# Patient Record
Sex: Male | Born: 1950 | Race: White | Hispanic: No | Marital: Married | Smoking: Former smoker
Health system: Southern US, Community
[De-identification: ages and names within clinical notes are randomized; demographics above are authoritative.]

## PROBLEM LIST (undated history)

## (undated) DIAGNOSIS — R079 Chest pain, unspecified: Secondary | ICD-10-CM

## (undated) DIAGNOSIS — N201 Calculus of ureter: Secondary | ICD-10-CM

## (undated) DIAGNOSIS — M5416 Radiculopathy, lumbar region: Secondary | ICD-10-CM

## (undated) DIAGNOSIS — G4733 Obstructive sleep apnea (adult) (pediatric): Secondary | ICD-10-CM

## (undated) DIAGNOSIS — R9431 Abnormal electrocardiogram [ECG] [EKG]: Secondary | ICD-10-CM

## (undated) DIAGNOSIS — Z9989 Dependence on other enabling machines and devices: Secondary | ICD-10-CM

## (undated) DIAGNOSIS — Z87442 Personal history of urinary calculi: Secondary | ICD-10-CM

## (undated) DIAGNOSIS — E119 Type 2 diabetes mellitus without complications: Secondary | ICD-10-CM

## (undated) DIAGNOSIS — C61 Malignant neoplasm of prostate: Secondary | ICD-10-CM

## (undated) DIAGNOSIS — I10 Essential (primary) hypertension: Secondary | ICD-10-CM

## (undated) DIAGNOSIS — K219 Gastro-esophageal reflux disease without esophagitis: Secondary | ICD-10-CM

## (undated) DIAGNOSIS — T7840XA Allergy, unspecified, initial encounter: Secondary | ICD-10-CM

## (undated) DIAGNOSIS — Z8489 Family history of other specified conditions: Secondary | ICD-10-CM

## (undated) DIAGNOSIS — M1990 Unspecified osteoarthritis, unspecified site: Secondary | ICD-10-CM

## (undated) DIAGNOSIS — C4490 Unspecified malignant neoplasm of skin, unspecified: Secondary | ICD-10-CM

## (undated) DIAGNOSIS — I2699 Other pulmonary embolism without acute cor pulmonale: Secondary | ICD-10-CM

## (undated) DIAGNOSIS — R011 Cardiac murmur, unspecified: Secondary | ICD-10-CM

## (undated) DIAGNOSIS — H269 Unspecified cataract: Secondary | ICD-10-CM

## (undated) DIAGNOSIS — R319 Hematuria, unspecified: Secondary | ICD-10-CM

## (undated) DIAGNOSIS — I7789 Other specified disorders of arteries and arterioles: Secondary | ICD-10-CM

## (undated) DIAGNOSIS — G4730 Sleep apnea, unspecified: Secondary | ICD-10-CM

## (undated) DIAGNOSIS — Z4889 Encounter for other specified surgical aftercare: Secondary | ICD-10-CM

## (undated) DIAGNOSIS — Z973 Presence of spectacles and contact lenses: Secondary | ICD-10-CM

## (undated) DIAGNOSIS — Z01810 Encounter for preprocedural cardiovascular examination: Secondary | ICD-10-CM

## (undated) DIAGNOSIS — E785 Hyperlipidemia, unspecified: Secondary | ICD-10-CM

## (undated) DIAGNOSIS — IMO0002 Reserved for concepts with insufficient information to code with codable children: Secondary | ICD-10-CM

## (undated) DIAGNOSIS — R109 Unspecified abdominal pain: Secondary | ICD-10-CM

## (undated) DIAGNOSIS — T8859XA Other complications of anesthesia, initial encounter: Secondary | ICD-10-CM

## (undated) DIAGNOSIS — N200 Calculus of kidney: Secondary | ICD-10-CM

## (undated) DIAGNOSIS — K649 Unspecified hemorrhoids: Secondary | ICD-10-CM

## (undated) HISTORY — DX: Gastro-esophageal reflux disease without esophagitis: K21.9

## (undated) HISTORY — DX: Unspecified cataract: H26.9

## (undated) HISTORY — DX: Abnormal electrocardiogram (ECG) (EKG): R94.31

## (undated) HISTORY — DX: Obstructive sleep apnea (adult) (pediatric): G47.33

## (undated) HISTORY — PX: OTHER SURGICAL HISTORY: SHX169

## (undated) HISTORY — PX: SKIN CANCER EXCISION: SHX779

## (undated) HISTORY — DX: Essential (primary) hypertension: I10

## (undated) HISTORY — DX: Calculus of ureter: N20.1

## (undated) HISTORY — PX: UPPER GASTROINTESTINAL ENDOSCOPY: SHX188

## (undated) HISTORY — DX: Reserved for concepts with insufficient information to code with codable children: IMO0002

## (undated) HISTORY — PX: COLONOSCOPY: SHX174

## (undated) HISTORY — DX: Calculus of kidney: N20.0

## (undated) HISTORY — DX: Encounter for other specified surgical aftercare: Z48.89

## (undated) HISTORY — DX: Other specified disorders of arteries and arterioles: I77.89

## (undated) HISTORY — DX: Sleep apnea, unspecified: G47.30

## (undated) HISTORY — DX: Encounter for preprocedural cardiovascular examination: Z01.810

## (undated) HISTORY — DX: Radiculopathy, lumbar region: M54.16

## (undated) HISTORY — DX: Chest pain, unspecified: R07.9

## (undated) HISTORY — DX: Allergy, unspecified, initial encounter: T78.40XA

## (undated) HISTORY — DX: Unspecified malignant neoplasm of skin, unspecified: C44.90

## (undated) HISTORY — DX: Type 2 diabetes mellitus without complications: E11.9

## (undated) HISTORY — DX: Malignant neoplasm of prostate: C61

---
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
## 1987-06-03 HISTORY — PX: OTHER SURGICAL HISTORY: SHX169

## 2003-06-03 HISTORY — PX: CATARACT EXTRACTION, BILATERAL: SHX1313

## 2004-01-11 ENCOUNTER — Inpatient Hospital Stay (HOSPITAL_BASED_OUTPATIENT_CLINIC_OR_DEPARTMENT_OTHER): Admission: RE | Admit: 2004-01-11 | Discharge: 2004-01-11 | Payer: Self-pay | Admitting: Cardiology

## 2004-01-11 HISTORY — PX: CARDIAC CATHETERIZATION: SHX172

## 2004-01-15 ENCOUNTER — Ambulatory Visit (HOSPITAL_COMMUNITY): Admission: RE | Admit: 2004-01-15 | Discharge: 2004-01-15 | Payer: Self-pay | Admitting: Cardiology

## 2006-02-21 ENCOUNTER — Emergency Department (HOSPITAL_COMMUNITY): Admission: EM | Admit: 2006-02-21 | Discharge: 2006-02-21 | Payer: Self-pay | Admitting: Emergency Medicine

## 2009-02-07 ENCOUNTER — Emergency Department (HOSPITAL_COMMUNITY): Admission: EM | Admit: 2009-02-07 | Discharge: 2009-02-07 | Payer: Self-pay | Admitting: Emergency Medicine

## 2009-02-08 ENCOUNTER — Ambulatory Visit (HOSPITAL_COMMUNITY): Admission: RE | Admit: 2009-02-08 | Discharge: 2009-02-08 | Payer: Self-pay | Admitting: Urology

## 2009-02-08 HISTORY — PX: EXTRACORPOREAL SHOCK WAVE LITHOTRIPSY: SHX1557

## 2009-02-23 ENCOUNTER — Ambulatory Visit (HOSPITAL_BASED_OUTPATIENT_CLINIC_OR_DEPARTMENT_OTHER): Admission: RE | Admit: 2009-02-23 | Discharge: 2009-02-23 | Payer: Self-pay | Admitting: Urology

## 2009-02-23 HISTORY — PX: OTHER SURGICAL HISTORY: SHX169

## 2010-09-06 LAB — URINE MICROSCOPIC-ADD ON

## 2010-09-06 LAB — URINALYSIS, ROUTINE W REFLEX MICROSCOPIC
Bilirubin Urine: NEGATIVE
Glucose, UA: NEGATIVE mg/dL
Ketones, ur: NEGATIVE mg/dL
Leukocytes, UA: NEGATIVE
Nitrite: NEGATIVE
Protein, ur: NEGATIVE mg/dL
Specific Gravity, Urine: 1.018 (ref 1.005–1.030)
Urobilinogen, UA: 0.2 mg/dL (ref 0.0–1.0)
pH: 5.5 (ref 5.0–8.0)

## 2010-09-06 LAB — BASIC METABOLIC PANEL
BUN: 17 mg/dL (ref 6–23)
CO2: 24 mEq/L (ref 19–32)
Calcium: 9.1 mg/dL (ref 8.4–10.5)
Chloride: 105 mEq/L (ref 96–112)
Creatinine, Ser: 1.17 mg/dL (ref 0.4–1.5)
GFR calc Af Amer: >60 mL/min (ref >60)
GFR calc non Af Amer: >60 mL/min (ref >60)
Glucose, Bld: 125 mg/dL — ABNORMAL HIGH (ref 70–99)
Potassium: 4 mEq/L (ref 3.5–5.1)
Sodium: 139 mEq/L (ref 135–145)

## 2010-09-06 LAB — DIFFERENTIAL
Basophils Absolute: 0 10*3/uL (ref 0.0–0.1)
Basophils Relative: 0 % (ref 0–1)
Eosinophils Absolute: 0.1 10*3/uL (ref 0.0–0.7)
Eosinophils Relative: 1 % (ref 0–5)
Lymphocytes Relative: 19 % (ref 12–46)
Lymphs Abs: 1.9 10*3/uL (ref 0.7–4.0)
Monocytes Absolute: 0.7 10*3/uL (ref 0.1–1.0)
Monocytes Relative: 8 % (ref 3–12)
Neutro Abs: 7 10*3/uL (ref 1.7–7.7)
Neutrophils Relative %: 72 % (ref 43–77)

## 2010-09-06 LAB — CBC
HCT: 44 % (ref 39.0–52.0)
Hemoglobin: 15.2 g/dL (ref 13.0–17.0)
MCHC: 34.6 g/dL (ref 30.0–36.0)
MCV: 89.5 fL (ref 78.0–100.0)
Platelets: 174 10*3/uL (ref 150–400)
RBC: 4.92 MIL/uL (ref 4.22–5.81)
RDW: 13.2 % (ref 11.5–15.5)
WBC: 9.9 10*3/uL (ref 4.0–10.5)

## 2010-09-06 LAB — POCT I-STAT 4, (NA,K, GLUC, HGB,HCT)
Glucose, Bld: 116 mg/dL — ABNORMAL HIGH (ref 70–99)
HCT: 46 % (ref 39.0–52.0)
Hemoglobin: 15.6 g/dL (ref 13.0–17.0)
Potassium: 3.8 mEq/L (ref 3.5–5.1)
Sodium: 141 mEq/L (ref 135–145)

## 2010-10-18 NOTE — Cardiovascular Report (Signed)
NAME:  Joseph Pham, Joseph Pham                          ACCOUNT NO.:  [id number]   MEDICAL RECORD NO.:  [id number]                   PATIENT TYPE:  OIB   LOCATION:  6598                                 FACILITY:  [facility]   PHYSICIAN:  Learta Codding, M.D. [facility]             DATE OF BIRTH:  1950/11/25   DATE OF PROCEDURE:  01/11/2004  DATE OF DISCHARGE:                              CARDIAC CATHETERIZATION   PROCEDURE PERFORMED:  1. Left heart catheterization with selective coronary angiography.  2. Ventriculography.  3. Aortography.   DIAGNOSIS:  1. No evidence of significant epicardial flow limiting coronary artery     disease.  2. Normal left ventricular systolic function.  3. 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   INDICATIONS FOR PROCEDURE:  The patient is a 60 year old male with a history  of atypical substernal chest pain.  The patient was referred for a  Cardiolite study which suggested inferior ischemia.  Subsequently, he was  referred for a cardiac catheterization to assess his coronary anatomy.  The  patient does have multiple risk factors for CAD.   DESCRIPTION OF PROCEDURE:  After informed consent was obtained, the patient  was brought to the catheterization laboratory.  The right groin was  sterilely prepped and draped.  A 4 French arterial sheath was placed using  modified Seldinger technique.  A 4 [place] JL5 and 4 [place] progressive right  catheter were used for coronary angiography.  A 4 French angled pigtail  catheter was used for ventriculography.  Selective coronary angiography was  performed in various projections using minimal injection of contrast.  Ventriculography was performed using power injection of contrast.  Aortography was also performed in the LAO projection with power injection.  At the termination of the procedure, all catheters and sheaths were removed  with no complications encountered.   HEMODYNAMICS:  Left ventricular pressure 147/18 mmHg,  aortic pressure 147/87  mmHg.   VENTRICULOGRAPHY:  Ejection fraction 60% without segmental wall motion  abnormalities.   SELECTIVE CORONARY ANGIOGRAPHY:  1. Left main coronary artery was short without evidence of flow limiting     disease.  2. Left anterior descending  artery was a large caliber vessel without any     flow limiting lesions.  The diagonal branches were free of disease.  3. Left circumflex coronary artery essentially a normal vessel.  4. Right coronary artery demonstrates no significant flow limiting lesions.     The PDA and posterolateral branches were free of flow limiting lesions.   RECOMMENDATIONS:  No evidence of significant flow limiting coronary artery  disease.  The patient was found to have a dilated aortic root and ascending  aorta.  This was difficult to size using aortography.  The patient has been  scheduled for a CT angiogram to evaluate the aortic root and ascending aorta  and obtain measurements for future  reference.  There was no associated aortic insufficiency, however.  The  patient also had mild systemic hypertension and this should be aggressively  treated in the setting of this aortic dilatation.  The results were  discussed with Dr. Daleen Squibb who will see the patient in a couple of weeks in the  office after CT angiogram has been performed.                                               Learta Codding, M.D. [facility]    GED/MEDQ  D:  01/11/2004  T:  01/12/2004  Job:  [id number]   cc:   Thomas C. Wall, M.D.

## 2010-12-30 ENCOUNTER — Ambulatory Visit
Admission: RE | Admit: 2010-12-30 | Discharge: 2010-12-30 | Disposition: A | Discharge status: Home / Self Care | Payer: BC Managed Care – PPO | Source: Ambulatory Visit | Attending: Radiation Oncology | Admitting: Radiation Oncology

## 2010-12-30 DIAGNOSIS — I10 Essential (primary) hypertension: Secondary | ICD-10-CM | POA: Insufficient documentation

## 2010-12-30 DIAGNOSIS — Z79899 Other long term (current) drug therapy: Secondary | ICD-10-CM | POA: Insufficient documentation

## 2010-12-30 DIAGNOSIS — C61 Malignant neoplasm of prostate: Secondary | ICD-10-CM | POA: Insufficient documentation

## 2010-12-30 DIAGNOSIS — E7800 Pure hypercholesterolemia, unspecified: Secondary | ICD-10-CM | POA: Insufficient documentation

## 2010-12-30 DIAGNOSIS — G4730 Sleep apnea, unspecified: Secondary | ICD-10-CM | POA: Insufficient documentation

## 2010-12-30 DIAGNOSIS — K219 Gastro-esophageal reflux disease without esophagitis: Secondary | ICD-10-CM | POA: Insufficient documentation

## 2011-05-20 ENCOUNTER — Encounter: Payer: Self-pay | Admitting: *Deleted

## 2011-05-20 NOTE — Progress Notes (Unsigned)
IIEF results from 12/30/10= [id number]

## 2011-06-04 ENCOUNTER — Other Ambulatory Visit: Payer: Self-pay | Admitting: Urology

## 2011-06-05 ENCOUNTER — Other Ambulatory Visit: Payer: Self-pay

## 2011-06-05 ENCOUNTER — Other Ambulatory Visit: Payer: Self-pay | Admitting: Urology

## 2011-06-05 ENCOUNTER — Ambulatory Visit
Admission: RE | Admit: 2011-06-05 | Discharge: 2011-06-05 | Disposition: A | Discharge status: Home / Self Care | Payer: BC Managed Care – PPO | Source: Ambulatory Visit | Attending: Urology | Admitting: Urology

## 2011-06-05 ENCOUNTER — Ambulatory Visit (HOSPITAL_BASED_OUTPATIENT_CLINIC_OR_DEPARTMENT_OTHER)
Admission: RE | Admit: 2011-06-05 | Discharge: 2011-06-05 | Disposition: A | Discharge status: Home / Self Care | Payer: BC Managed Care – PPO | Source: Ambulatory Visit | Attending: Urology | Admitting: Urology

## 2011-06-05 DIAGNOSIS — C61 Malignant neoplasm of prostate: Secondary | ICD-10-CM

## 2011-06-05 DIAGNOSIS — Z01810 Encounter for preprocedural cardiovascular examination: Secondary | ICD-10-CM | POA: Insufficient documentation

## 2011-06-06 ENCOUNTER — Ambulatory Visit (HOSPITAL_COMMUNITY): Payer: BC Managed Care – PPO

## 2011-06-08 ENCOUNTER — Encounter: Payer: Self-pay | Admitting: Radiation Oncology

## 2011-06-08 NOTE — Progress Notes (Signed)
  Radiation Oncology         [phone] ________________________________  Name: Joseph Pham MRN: [id number]  Date: 06/08/2011  DOB: Mar 14, 1951  Pubic arch study  Prostate Volume 55.8 cc    PLAN:  The patient will receive 110 Gy boost with seeds.  ________________________________  Artist Pais Kathrynn Running, M.D.

## 2011-06-13 ENCOUNTER — Encounter: Payer: Self-pay | Admitting: Radiation Oncology

## 2011-06-13 NOTE — Progress Notes (Signed)
Ref [phone] - spoke to [facility] Tx, no precert req'd for prostate implant.

## 2011-07-03 ENCOUNTER — Encounter (HOSPITAL_BASED_OUTPATIENT_CLINIC_OR_DEPARTMENT_OTHER): Payer: Self-pay | Admitting: *Deleted

## 2011-07-03 ENCOUNTER — Telehealth: Payer: Self-pay | Admitting: *Deleted

## 2011-07-04 ENCOUNTER — Encounter (HOSPITAL_BASED_OUTPATIENT_CLINIC_OR_DEPARTMENT_OTHER): Payer: Self-pay | Admitting: *Deleted

## 2011-07-04 NOTE — Progress Notes (Signed)
NPO AFTER MN. ARRIVES AT 0845. CURRENT CXR AND EKG W/ CHART AND IN EPIC. LABS TO DONE TODAY 07-04-11. WILL TAKES TOPROL AND PREVACID AM OF SURG W/ SIP OF WATER . AND DO FLEET ENEMA AM DOS.

## 2011-07-07 LAB — APTT: aPTT: 33 seconds (ref 24–37)

## 2011-07-07 LAB — COMPREHENSIVE METABOLIC PANEL
ALT: 31 U/L (ref 0–53)
AST: 23 U/L (ref 0–37)
Albumin: 3.6 g/dL (ref 3.5–5.2)
Alkaline Phosphatase: 59 U/L (ref 39–117)
BUN: 12 mg/dL (ref 6–23)
CO2: 26 mEq/L (ref 19–32)
Calcium: 9.4 mg/dL (ref 8.4–10.5)
Chloride: 104 mEq/L (ref 96–112)
Creatinine, Ser: 1.07 mg/dL (ref 0.50–1.35)
GFR calc Af Amer: 85 mL/min — ABNORMAL LOW (ref >90)
GFR calc non Af Amer: 74 mL/min — ABNORMAL LOW (ref >90)
Glucose, Bld: 105 mg/dL — ABNORMAL HIGH (ref 70–99)
Potassium: 3.9 mEq/L (ref 3.5–5.1)
Sodium: 140 mEq/L (ref 135–145)
Total Bilirubin: 0.4 mg/dL (ref 0.3–1.2)
Total Protein: 7 g/dL (ref 6.0–8.3)

## 2011-07-07 LAB — CBC
HCT: 42.2 % (ref 39.0–52.0)
Hemoglobin: 14.6 g/dL (ref 13.0–17.0)
MCH: 29.9 pg (ref 26.0–34.0)
MCHC: 34.6 g/dL (ref 30.0–36.0)
MCV: 86.5 fL (ref 78.0–100.0)
Platelets: 166 10*3/uL (ref 150–400)
RBC: 4.88 MIL/uL (ref 4.22–5.81)
RDW: 13.6 % (ref 11.5–15.5)
WBC: 4.6 10*3/uL (ref 4.0–10.5)

## 2011-07-07 LAB — PROTIME-INR
INR: 0.93 (ref 0.00–1.49)
Prothrombin Time: 12.7 seconds (ref 11.6–15.2)

## 2011-07-10 DIAGNOSIS — C61 Malignant neoplasm of prostate: Secondary | ICD-10-CM

## 2011-07-10 HISTORY — DX: Malignant neoplasm of prostate: C61

## 2011-07-10 NOTE — H&P (Signed)
History of Present Illness   Adenocarcinoma of the prostate: His PSA in 10/11 was 5.9. A repeat PSA in 5/12 was noted to be 5.53 with a free to total ratio of 5%. He was also noted on DRE to have induration in the right apex of the prostate. Prostate vol. - 26.6cc. Path. - adenocarcinoma Gleason score 4+3 = 7 in 1 core, 3+4 = 7 in 4 cores and 3+3 = 6 in 3 cores for a total of 8/12 cores positive. Clinical stage T2a.         Nephrolithiasis: He underwent left ureteroscopy and laser lithotripsy of a left UPJ stone as well as removal of stones in the lower pole of his left kidney in 9/10. The stone was calcium oxalate. He had lithotripsy of the stone previously without fragmentation. He has 5 stones in the right kidney.   Past Medical History Problems  1. History of  Hydronephrosis On The Left 591 2. History of  Ureteral Stone Left 592.1  Surgical History Problems  1. History of  Biopsy Of The Prostate Needle 2. History of  Cystoscopy With Insertion Of Ureteral Stent Left 3. History of  Cystoscopy With Pyeloscopy With Lithotripsy Left 4. History of  Hernia Repair 5. History of  Knee Surgery 6. History of  Lithotripsy Left  Current Meds 1. Crestor TABS; Therapy: (Recorded:08Sep2010) to 2. Fexofenadine HCl 180 MG Oral Tablet; Therapy: (Recorded:08Sep2010) to 3. Levofloxacin 500 MG Oral Tablet; 1 po q day beginning the day prior to biopsy; Therapy:  18May2012 to (Evaluate:21May2012)  Requested for: 18May2012; Last Rx:18May2012 4. Lovaza CAPS; Therapy: (Recorded:08Sep2010) to 5. Multivitamin/Iron TABS; Therapy: (Recorded:08Sep2010) to 6. Potassium Citrate 10 MEQ (1080 MG) Oral Tablet Extended Release; TAKE 2 TABLETS AT  BEDTIME; Therapy: 24Oct2011 to (Evaluate:19Oct2012); Last Rx:25Oct2011 7. Prevacid CPDR; Therapy: (Recorded:18May2012) to 8. Toprol XL 25 MG Oral Tablet Extended Release 24 Hour; Therapy: (Recorded:08Sep2010) to  Allergies Medication  1. Aspirin TABS 2. Trimox  CAPS  Social History Problems  1. Caffeine Use 3 cans daily 2. Marital History - Currently Married 3. Occupation: truck Hospital doctor 4. History of  Tobacco Use V15.82 smoked two packs dailysmoked for 20 yearsquit smoking 15 years ago.  Review of Systems: Pertinent items are noted in HPI. A comprehensive review of systems was negative except as above  Physical Exam: General appearance: alert and appears stated age Head: Normocephalic, without obvious abnormality, atraumatic Eyes: conjunctivae/corneas clear. EOM's intact.  Oropharynx: moist mucous membranes Neck: supple, symmetrical, trachea midline Resp: normal respiratory effort Cardio: regular rate and rhythm Back: symmetric, no curvature. ROM normal. No CVA tenderness. GI: soft, non-tender; bowel sounds normal; no masses,  no organomegaly Male genitalia: penis: normal male phallus with no lesions or discharge. Testes: bilaterally descended with no masses or tenderness. no hernias Rectal: He has normal sphincter tone. There is induration in the apex of the right lobe of his prostate. Extremities: extremities normal, atraumatic, no cyanosis or edema Skin: Skin color normal. No visible rashes or lesions Neurologic: Grossly normal  Assessed  1. Adenocarcinoma Of The Prostate Gland 185 1    Plan The patient was counseled about the natural history of prostate cancer and the standard treatment options that are available for prostate cancer. It was explained to him how his age and life expectancy, clinical stage, Gleason score, and PSA affect his prognosis, the decision to proceed with additional staging studies, as well as how that information influences recommended treatment strategies. We discussed the roles for active surveillance, radiation therapy, surgical  therapy, androgen deprivation, as well as ablative therapy options for the treatment of prostate cancer as appropriate to his individual cancer situation. We discussed the risks  and benefits of these options with regard to their impact on cancer control and also in terms of potential adverse events, complications, and impact on quiality of life particularly related to urinary, bowel, and sexual function. The patient was encouraged to ask questions throughout the discussion today and all questions were answered to his stated satisfaction. In addition, the patient was provided with and/or directed to appropriate resources and literature for further education about prostate cancer and treatment options.   45 minutes were spent in face to face consultation with patient today.        We discussed his treatment options and he was primarily interested in brachytherapy. He had many good questions about this form of therapy which I have answered to his satisfaction. He saw Dr. Kathrynn Running and has elected to proceed with radioactive seed implantation.

## 2011-07-10 NOTE — Progress Notes (Signed)
Received call from Jolyn Lent, [facility], regarding pt's current uri. Pt seen by physician, currently on antibx.  Dr. Rica Mast informed. Pt called earlier to say that he has uri w productive cough. Dr. Rica Mast informed.  Case cancelled per dr. Rica Mast.  Jolyn Lent, or scheduler for Dr. Ronal Fear informed.

## 2011-07-11 ENCOUNTER — Encounter: Payer: Self-pay | Admitting: Radiation Oncology

## 2011-07-11 NOTE — Progress Notes (Unsigned)
Spoke to [employer], no prior authorization required for prostate implant - CPT codes 02725, [id number], [id number], [id number], [id number], [id number], [id number].  Ref #[id number]

## 2011-07-25 NOTE — Telephone Encounter (Signed)
xxx

## 2011-08-04 ENCOUNTER — Encounter (HOSPITAL_BASED_OUTPATIENT_CLINIC_OR_DEPARTMENT_OTHER): Payer: Self-pay | Admitting: *Deleted

## 2011-08-04 LAB — PROTIME-INR
INR: 0.96 (ref 0.00–1.49)
Prothrombin Time: 13 seconds (ref 11.6–15.2)

## 2011-08-04 LAB — CBC
HCT: 43 % (ref 39.0–52.0)
Hemoglobin: 15 g/dL (ref 13.0–17.0)
MCH: 30.5 pg (ref 26.0–34.0)
MCHC: 34.9 g/dL (ref 30.0–36.0)
MCV: 87.4 fL (ref 78.0–100.0)
Platelets: 175 10*3/uL (ref 150–400)
RBC: 4.92 MIL/uL (ref 4.22–5.81)
RDW: 13.5 % (ref 11.5–15.5)
WBC: 5.3 10*3/uL (ref 4.0–10.5)

## 2011-08-04 LAB — COMPREHENSIVE METABOLIC PANEL
ALT: 24 U/L (ref 0–53)
AST: 22 U/L (ref 0–37)
Albumin: 3.9 g/dL (ref 3.5–5.2)
Alkaline Phosphatase: 60 U/L (ref 39–117)
BUN: 14 mg/dL (ref 6–23)
CO2: 29 mEq/L (ref 19–32)
Calcium: 9.6 mg/dL (ref 8.4–10.5)
Chloride: 101 mEq/L (ref 96–112)
Creatinine, Ser: 1.06 mg/dL (ref 0.50–1.35)
GFR calc Af Amer: 86 mL/min — ABNORMAL LOW (ref >90)
GFR calc non Af Amer: 74 mL/min — ABNORMAL LOW (ref >90)
Glucose, Bld: 95 mg/dL (ref 70–99)
Potassium: 4.2 mEq/L (ref 3.5–5.1)
Sodium: 140 mEq/L (ref 135–145)
Total Bilirubin: 0.6 mg/dL (ref 0.3–1.2)
Total Protein: 7.2 g/dL (ref 6.0–8.3)

## 2011-08-04 LAB — APTT: aPTT: 33 seconds (ref 24–37)

## 2011-08-04 NOTE — Progress Notes (Signed)
NPO AFTER MN. ARRIVES AT 0845. LABS TO DONE AT [facility]. CURRENT EKG AND CXR W/ CHART. WILL TAKE TOPROL , PREVACID AND ALLEGRA AM OF SURG. W/ SIPS OF WATER. AND DO FLEET ENEMA .  (FYI: PT WAS SCHEDULED ON 07-04-2011, CX'D DUE TO PT HAD THE FLU).

## 2011-08-06 NOTE — H&P (Signed)
History of Present Illness   Adenocarcinoma of the prostate: His PSA in 10/11 was 5.9. A repeat PSA in 5/12 was noted to be 5.53 with a free to total ratio of 5%. He was also noted on DRE to have induration in the right apex of the prostate. Prostate vol. - 26.6cc. Path. - adenocarcinoma Gleason score 4+3 = 7 in 1 core, 3+4 = 7 in 4 cores and 3+3 = 6 in 3 cores for a total of 8/12 cores positive. Clinical stage T2a.         Nephrolithiasis: He underwent left ureteroscopy and laser lithotripsy of a left UPJ stone as well as removal of stones in the lower pole of his left kidney in 9/10. The stone was calcium oxalate. He had lithotripsy of the stone previously without fragmentation. He has 5 stones in the right kidney.   Past Medical History Problems  1. History of  Hydronephrosis On The Left 591 2. History of  Ureteral Stone Left 592.1  Surgical History Problems  1. History of  Biopsy Of The Prostate Needle 2. History of  Cystoscopy With Insertion Of Ureteral Stent Left 3. History of  Cystoscopy With Pyeloscopy With Lithotripsy Left 4. History of  Hernia Repair 5. History of  Knee Surgery 6. History of  Lithotripsy Left  Current Meds 1. Crestor TABS; Therapy: (Recorded:08Sep2010) to 2. Fexofenadine HCl 180 MG Oral Tablet; Therapy: (Recorded:08Sep2010) to 3. Levofloxacin 500 MG Oral Tablet; 1 po q day beginning the day prior to biopsy; Therapy:  18May2012 to (Evaluate:21May2012)  Requested for: 18May2012; Last Rx:18May2012 4. Lovaza CAPS; Therapy: (Recorded:08Sep2010) to 5. Multivitamin/Iron TABS; Therapy: (Recorded:08Sep2010) to 6. Potassium Citrate 10 MEQ (1080 MG) Oral Tablet Extended Release; TAKE 2 TABLETS AT  BEDTIME; Therapy: 24Oct2011 to (Evaluate:19Oct2012); Last Rx:25Oct2011 7. Prevacid CPDR; Therapy: (Recorded:18May2012) to 8. Toprol XL 25 MG Oral Tablet Extended Release 24 Hour; Therapy: (Recorded:08Sep2010) to  Allergies Medication  1. Aspirin TABS 2. Trimox  CAPS  Social History Problems  1. Caffeine Use 3 cans daily 2. Marital History - Currently Married 3. Occupation: truck Hospital doctor 4. History of  Tobacco Use V15.82 smoked two packs dailysmoked for 20 yearsquit smoking 15 years ago Denied  5. History of  Alcohol Use  Review of Systems Genitourinary, constitutional, skin, eye, otolaryngeal, hematologic/lymphatic, cardiovascular, pulmonary, endocrine, musculoskeletal, gastrointestinal, neurological and psychiatric system(s) were reviewed and pertinent findings if present are noted.    Vitals Vital Signs [Data Includes: Last 1 Day]  08Sep2010 11:21AM  Blood Pressure  Blood Pressure: 130 / 79 Temperature  Temperature: 98.4 F Pulse  Heart Rate: 74  Physical Exam Constitutional: Well nourished and well developed. No acute distress.  ENT:. The ears and nose are normal in appearance.  Neck: The appearance of the neck is normal and no neck mass is present.  Pulmonary: No respiratory distress and normal respiratory rhythm and effort.  Cardiovascular: Heart rate and rhythm are normal. No peripheral edema.  Abdomen: The abdomen is soft and nontender. No masses are palpated. No CVA tenderness. No hernias are palpable. No hepatosplenomegaly noted.  . Genitourinary: On physical examination he was in no distress. He is moderately obese. He had no CVA tenderness. He may have been minimally tender in the left lower quadrant. Both testicles were normal. He had some rectal stenosis and his prostate was a little bit difficult to feel. He says his PSA checkups have been normal.   Lymphatics: The femoral and inguinal nodes are not enlarged or tender.  Skin: Normal skin  turgor, no visible rash and no visible skin lesions.  Neuro/Psych:. Mood and affect are appropriate.   Assessment Assessed  1. Adenocarcinoma Of The Prostate Gland 185 1   Plan    The patient was counseled about the natural history of prostate cancer and the standard treatment  options that are available for prostate cancer. It was explained to him how his age and life expectancy, clinical stage, Gleason score, and PSA affect his prognosis, the decision to proceed with additional staging studies, as well as how that information influences recommended treatment strategies. We discussed the roles for active surveillance, radiation therapy, surgical therapy, androgen deprivation, as well as ablative therapy options for the treatment of prostate cancer as appropriate to his individual cancer situation. We discussed the risks and benefits of these options with regard to their impact on cancer control and also in terms of potential adverse events, complications, and impact on quiality of life particularly related to urinary, bowel, and sexual function. The patient was encouraged to ask questions throughout the discussion today and all questions were answered to his stated satisfaction. In addition, the patient was provided with and/or directed to appropriate resources and literature for further education about prostate cancer and treatment options.   45 minutes were spent in face to face consultation with patient today.        We discussed his treatment options and he was primarily interested in brachytherapy. He had many good questions about this form of therapy which I have answered to his satisfaction. After consultation with Dr. Kathrynn Running he made his decision to proceed with radioactive seed implantation and presents for that.

## 2011-08-07 ENCOUNTER — Telehealth: Payer: Self-pay | Admitting: *Deleted

## 2011-08-07 NOTE — Telephone Encounter (Signed)
xxxx 

## 2011-08-08 ENCOUNTER — Encounter (HOSPITAL_BASED_OUTPATIENT_CLINIC_OR_DEPARTMENT_OTHER): Payer: Self-pay | Admitting: Anesthesiology

## 2011-08-08 ENCOUNTER — Encounter (HOSPITAL_BASED_OUTPATIENT_CLINIC_OR_DEPARTMENT_OTHER): Payer: Self-pay | Admitting: *Deleted

## 2011-08-08 ENCOUNTER — Encounter (HOSPITAL_BASED_OUTPATIENT_CLINIC_OR_DEPARTMENT_OTHER): Payer: Self-pay | Admitting: Certified Registered Nurse Anesthetist

## 2011-08-08 ENCOUNTER — Ambulatory Visit (HOSPITAL_COMMUNITY): Payer: BC Managed Care – PPO | Attending: Urology

## 2011-08-08 ENCOUNTER — Ambulatory Visit (HOSPITAL_BASED_OUTPATIENT_CLINIC_OR_DEPARTMENT_OTHER)
Admission: RE | Admit: 2011-08-08 | Discharge: 2011-08-08 | Disposition: A | Discharge status: Home Health | Payer: BC Managed Care – PPO | Source: Ambulatory Visit | Attending: Urology | Admitting: Urology

## 2011-08-08 ENCOUNTER — Encounter (HOSPITAL_BASED_OUTPATIENT_CLINIC_OR_DEPARTMENT_OTHER)
Admission: RE | Disposition: A | Discharge status: Home Health | Payer: Self-pay | Source: Ambulatory Visit | Attending: Urology

## 2011-08-08 DIAGNOSIS — C61 Malignant neoplasm of prostate: Secondary | ICD-10-CM | POA: Insufficient documentation

## 2011-08-08 DIAGNOSIS — Z87442 Personal history of urinary calculi: Secondary | ICD-10-CM | POA: Insufficient documentation

## 2011-08-08 DIAGNOSIS — Z01812 Encounter for preprocedural laboratory examination: Secondary | ICD-10-CM | POA: Insufficient documentation

## 2011-08-08 DIAGNOSIS — Z79899 Other long term (current) drug therapy: Secondary | ICD-10-CM | POA: Insufficient documentation

## 2011-08-08 HISTORY — DX: Essential (primary) hypertension: I10

## 2011-08-08 HISTORY — DX: Gastro-esophageal reflux disease without esophagitis: K21.9

## 2011-08-08 HISTORY — DX: Hyperlipidemia, unspecified: E78.5

## 2011-08-08 HISTORY — DX: Obstructive sleep apnea (adult) (pediatric): G47.33

## 2011-08-08 HISTORY — DX: Personal history of urinary calculi: Z87.442

## 2011-08-08 HISTORY — PX: RADIOACTIVE SEED IMPLANT: SHX5150

## 2011-08-08 HISTORY — DX: Dependence on other enabling machines and devices: Z99.89

## 2011-08-08 HISTORY — DX: Unspecified osteoarthritis, unspecified site: M19.90

## 2011-08-08 SURGERY — INSERTION, RADIATION SOURCE, PROSTATE
Anesthesia: General | Site: Prostate | Wound class: Clean Contaminated

## 2011-08-08 MED ORDER — ONDANSETRON HCL 4 MG/2ML IJ SOLN
INTRAMUSCULAR | Status: DC | PRN
  Administered 2011-08-08: 4 mg via INTRAVENOUS

## 2011-08-08 MED ORDER — IOHEXOL 350 MG/ML SOLN
INTRAVENOUS | Status: DC | PRN
  Administered 2011-08-08: 7 mL via INTRAVENOUS

## 2011-08-08 MED ORDER — STERILE WATER FOR IRRIGATION IR SOLN
Status: DC | PRN
  Administered 2011-08-08: 3000 mL via INTRAVESICAL

## 2011-08-08 MED ORDER — LACTATED RINGERS IV SOLN: INTRAVENOUS | Status: DC

## 2011-08-08 MED ORDER — HYDROCODONE-ACETAMINOPHEN 7.5-325 MG PO TABS
1.0000 | ORAL_TABLET | ORAL | Status: AC | PRN
Start: 2011-08-08 — End: 2011-08-18

## 2011-08-08 MED ORDER — FENTANYL CITRATE 0.05 MG/ML IJ SOLN: 25.0000 ug | INTRAMUSCULAR | Status: DC | PRN

## 2011-08-08 MED ORDER — DEXAMETHASONE SODIUM PHOSPHATE 4 MG/ML IJ SOLN
INTRAMUSCULAR | Status: DC | PRN
  Administered 2011-08-08: 10 mg via INTRAVENOUS

## 2011-08-08 MED ORDER — MIDAZOLAM HCL 5 MG/5ML IJ SOLN
INTRAMUSCULAR | Status: DC | PRN
  Administered 2011-08-08: 2 mg via INTRAVENOUS

## 2011-08-08 MED ORDER — CIPROFLOXACIN HCL 500 MG PO TABS: 500.0000 mg | ORAL_TABLET | Freq: Two times a day (BID) | ORAL | Status: AC

## 2011-08-08 MED ORDER — PROPOFOL 10 MG/ML IV EMUL
INTRAVENOUS | Status: DC | PRN
  Administered 2011-08-08: 200 mg via INTRAVENOUS
  Administered 2011-08-08: 100 mg via INTRAVENOUS

## 2011-08-08 MED ORDER — FLEET ENEMA 7-19 GM/118ML RE ENEM: 1.0000 | ENEMA | Freq: Once | RECTAL | Status: DC

## 2011-08-08 MED ORDER — FENTANYL CITRATE 0.05 MG/ML IJ SOLN
INTRAMUSCULAR | Status: DC | PRN
Start: 2011-08-08 — End: 2011-08-08
  Administered 2011-08-08 (×2): 50 ug via INTRAVENOUS

## 2011-08-08 MED ORDER — CIPROFLOXACIN IN D5W 400 MG/200ML IV SOLN
400.0000 mg | INTRAVENOUS | Status: AC
  Administered 2011-08-08: 400 mg via INTRAVENOUS

## 2011-08-08 MED ORDER — LIDOCAINE HCL (CARDIAC) 20 MG/ML IV SOLN
INTRAVENOUS | Status: DC | PRN
  Administered 2011-08-08: 80 mg via INTRAVENOUS

## 2011-08-08 MED ORDER — LACTATED RINGERS IV SOLN
INTRAVENOUS | Status: DC
  Administered 2011-08-08: 11:00:00 via INTRAVENOUS
  Administered 2011-08-08: 100 mL/h via INTRAVENOUS

## 2011-08-08 SURGICAL SUPPLY — 28 items
BAG URINE DRAINAGE (UROLOGICAL SUPPLIES) ×2 IMPLANT
BLADE SURG ROTATE 9660 (MISCELLANEOUS) ×2 IMPLANT
CATH FOLEY 2WAY SLVR  5CC 16FR (CATHETERS) ×2
CATH FOLEY 2WAY SLVR 5CC 16FR (CATHETERS) ×2 IMPLANT
CATH ROBINSON RED A/P 20FR (CATHETERS) ×2 IMPLANT
CLOTH BEACON ORANGE TIMEOUT ST (SAFETY) ×2 IMPLANT
COVER MAYO STAND STRL (DRAPES) ×2 IMPLANT
COVER TABLE BACK 60X90 (DRAPES) ×2 IMPLANT
DRSG TEGADERM 4X4.75 (GAUZE/BANDAGES/DRESSINGS) ×2 IMPLANT
DRSG TEGADERM 8X12 (GAUZE/BANDAGES/DRESSINGS) ×2 IMPLANT
GLOVE BIO SURGEON STRL SZ7.5 (GLOVE) IMPLANT
GLOVE BIO SURGEON STRL SZ8 (GLOVE) ×4 IMPLANT
GLOVE BIOGEL PI IND STRL 7.0 (GLOVE) ×3 IMPLANT
GLOVE BIOGEL PI INDICATOR 7.0 (GLOVE) ×3
GLOVE ECLIPSE 8.0 STRL XLNG CF (GLOVE) ×8 IMPLANT
GOWN STRL REIN XL XLG (GOWN DISPOSABLE) ×2 IMPLANT
GOWN SURGICAL LARGE (GOWNS) ×2 IMPLANT
GOWN XL W/COTTON TOWEL STD (GOWNS) ×2 IMPLANT
HOLDER FOLEY CATH W/STRAP (MISCELLANEOUS) ×2 IMPLANT
IV NS IRRIG 3000ML ARTHROMATIC (IV SOLUTION) IMPLANT
IV WATER IRR. 1000ML (IV SOLUTION) ×2 IMPLANT
PACK CYSTOSCOPY (CUSTOM PROCEDURE TRAY) ×2 IMPLANT
SPONGE GAUZE 4X4 12PLY (GAUZE/BANDAGES/DRESSINGS) ×2 IMPLANT
SYRINGE 10CC LL (SYRINGE) ×2 IMPLANT
UNDERPAD 30X30 INCONTINENT (UNDERPADS AND DIAPERS) ×4 IMPLANT
WATER STERILE IRR 3000ML UROMA (IV SOLUTION) ×2 IMPLANT
WATER STERILE IRR 500ML POUR (IV SOLUTION) ×2 IMPLANT
radioactive seeds ×2 IMPLANT

## 2011-08-08 NOTE — Interval H&P Note (Signed)
History and Physical Interval Note:  08/08/2011 9:28 AM  Joseph Pham  has presented today for surgery, with the diagnosis of prostate cancer  The various methods of treatment have been discussed with the patient and family. After consideration of risks, benefits and other options for treatment, the patient has consented to  Procedure(s) (LRB): RADIOACTIVE SEED IMPLANT (N/A) as a surgical intervention .  The patients' history has been reviewed, patient examined, no change in status, stable for surgery.  I have reviewed the patients' chart and labs.  Questions were answered to the patient's satisfaction.     [place]

## 2011-08-08 NOTE — Anesthesia Preprocedure Evaluation (Addendum)
Anesthesia Evaluation  Patient identified by MRN, date of birth, ID band Patient awake    Reviewed: Allergy & Precautions, H&P , NPO status , Patient's Chart, lab work & pertinent test results, reviewed documented beta blocker date and time   Airway Mallampati: II TM Distance: >3 FB Neck ROM: full    Dental  (+) Dental Advisory Given,    Pulmonary neg pulmonary ROS, sleep apnea and Continuous Positive Airway Pressure Ventilation ,  breath sounds clear to auscultation  Pulmonary exam normal       Cardiovascular Exercise Tolerance: Good hypertension, Pt. on home beta blockers negative cardio ROS  Rhythm:regular Rate:Normal     Neuro/Psych negative neurological ROS  negative psych ROS   GI/Hepatic negative GI ROS, Neg liver ROS, GERD-  Medicated and Controlled,  Endo/Other  negative endocrine ROSMorbid obesity  Renal/GU negative Renal ROS  negative genitourinary   Musculoskeletal   Abdominal (+) + obese,   Peds  Hematology negative hematology ROS (+)   Anesthesia Other Findings   Reproductive/Obstetrics negative OB ROS                          Anesthesia Physical Anesthesia Plan  ASA: III  Anesthesia Plan: General   Post-op Pain Management:    Induction: Intravenous  Airway Management Planned: LMA  Additional Equipment:   Intra-op Plan:   Post-operative Plan:   Informed Consent: I have reviewed the patients History and Physical, chart, labs and discussed the procedure including the risks, benefits and alternatives for the proposed anesthesia with the patient or authorized representative who has indicated his/her understanding and acceptance.   Dental Advisory Given  Plan Discussed with: CRNA and Surgeon  Anesthesia Plan Comments:         Anesthesia Quick Evaluation

## 2011-08-08 NOTE — Transfer of Care (Signed)
Immediate Anesthesia Transfer of Care Note  Patient: Joseph Pham  Procedure(s) Performed: Procedure(s) (LRB): RADIOACTIVE SEED IMPLANT (N/A)  Patient Location: PACU  Anesthesia Type: General  Level of Consciousness: awake, alert , oriented and patient cooperative  Airway & Oxygen Therapy: Patient Spontanous Breathing and Patient connected to face mask oxygen  Post-op Assessment: Report given to PACU RN and Post -op Vital signs reviewed and stable  Post vital signs: Reviewed and stable  Complications: No apparent anesthesia complications

## 2011-08-08 NOTE — Anesthesia Postprocedure Evaluation (Signed)
  Anesthesia Post-op Note  Patient: Joseph Pham  Procedure(s) Performed: Procedure(s) (LRB): RADIOACTIVE SEED IMPLANT (N/A)  Patient Location: PACU  Anesthesia Type: General  Level of Consciousness: awake and alert   Airway and Oxygen Therapy: Patient Spontanous Breathing  Post-op Pain: mild  Post-op Assessment: Post-op Vital signs reviewed, Patient's Cardiovascular Status Stable, Respiratory Function Stable, Patent Airway and No signs of Nausea or vomiting  Post-op Vital Signs: stable  Complications: No apparent anesthesia complications

## 2011-08-08 NOTE — Anesthesia Procedure Notes (Signed)
Procedure Name: LMA Insertion Performed by: Maris Berger Pre-anesthesia Checklist: Patient identified, Emergency Drugs available, Suction available and Patient being monitored Patient Re-evaluated:Patient Re-evaluated prior to inductionOxygen Delivery Method: Circle System Utilized Preoxygenation: Pre-oxygenation with 100% oxygen Intubation Type: IV induction Ventilation: Mask ventilation without difficulty LMA: LMA with gastric port inserted LMA Size: 5.0 Number of attempts: 1 Placement Confirmation: positive ETCO2 Tube secured with: Tape Dental Injury: Teeth and Oropharynx as per pre-operative assessment

## 2011-08-08 NOTE — Discharge Instructions (Addendum)
DISCHARGE INSTRUCTIONS FOR PROSTATE SEED IMPLANTATION  Removal of catheter Remove the foley catheter after 24 hours ( day after the procedure).can be done easily by cutting the side port of the catheter, whichallow the balloon to deflate.  You will see 1-2 teaspoons of clear water as the balloon deflates and then the catheter can be slid out without difficulty.        Cut here  Antibiotics You may be given a prescription for an antibiotic to take when you arrive home. If so, be sure to take every tablet in the bottle, even if you are feeling better before the prescription is finished. If you begin itching, notice a rash or start to swell on your trunk, arms, legs and/or throat, immediately stop taking the antibiotic and call your Urologist. Diet Resume your usual diet when you return home. To keep your bowels moving easily and softly, drink prune, apple and cranberry juice at room temperature. You may also take a stool softener, such as Colace, which is available without prescription at local pharmacies. Daily activities   No driving or heavy lifting for at least two days after the implant.   No bike riding, horseback riding or riding lawn mowers for the first month after the implant.   Any strenuous physical activity should be approved by your doctor before you resume it. Sexual relations You may resume sexual relations two weeks after the procedure. A condom should be used for the first two weeks. Your semen may be dark brown or black; this is normal and is related bleeding that may have occurred during the implant. Postoperative swelling Expect swelling and bruising of the scrotum and perineum (the area between the scrotum and anus). Both the swelling and the bruising should resolve in l or 2 weeks. Ice packs and over- the-counter medications such as Tylenol, Advil or Aleve may lessen your discomfort. Postoperative urination Most men experience burning on urination and/or urinary frequency.  If this becomes bothersome, contact your Urologist.  Medication can be prescribed to relieve these problems.  It is normal to have some blood in your urine for a few days after the implant. Special instructions related to the seeds It is unlikely that you will pass an Iodine-125 seed in your urine. The seeds are silver in color and are about as large as a grain of rice. If you pass a seed, do not handle it with your fingers. Use a spoon to place it in an envelope or jar in place this in base occluded area such as the garage or basement for return to the radiation clinic at your convenience.  Contact your doctor for   Temperature greater than 101 F   Increasing pain   Inability to urinate Follow-up  You should have follow up with your urologist and radiation oncologist about 3 weeks after the procedure. General information regarding Iodine seeds   Iodine-125 is a low energy radioactive material. It is not deeply penetrating and loses energy at short distances. Your prostate will absorb the radiation. Objects that are touched or used by the patient do not become radioactive.   Body wastes (urine and stool) or body fluids (saliva, tears, semen or blood) are not radioactive.   The Nuclear Regulatory Commission [facility]) has determined that no radiation precautions are needed for patients undergoing Iodine-125 seed implantation. The [facility] states that such patients do not present a risk to the people around them, including young children and pregnant women. However, in keeping with the general principle  that radiation exposure should be kept as low reasonably possible, we suggest the following:   Children and pets should not sit on the patient's lap for the first two (2) weeks after the implant.   Pregnant (or possibly pregnant) women should avoid prolonged, close contact with the patient for the first two (2) weeks after the implant.   A distance of three (3) feet is acceptable. At a distance of three (3)  feet, there is no limit to the length of time anyone can be with the patientBrachytherapy for Prostate Cancer Brachytherapy for prostate cancer is radiation treatment. It uses ultrasound to guide the insertion of tiny radioactive seeds into the prostate. This allows additional radiation to be given to the tumor and less radiation to the surrounding normal tissues. The seeds are about the thickness of a pencil lead and just over ? inch long. Brachytherapy is associated with fewer side effects than external beam radiation therapy alone. Usually, there is a delay of about 2 months after the seed implants before external radiation is begun, if external radiation is also used. LET YOUR CAREGIVER KNOW ABOUT:  Any allergies.  All medicines you are taking including vitamins, herbs, eyedrops, and over-the-counter medicines and creams.  Use of steroids (through mouth or as creams).  Previous problems with medicines that make a person sleep (general anesthetics) or numbing medicines.  History of bleeding or blood problems.  Previous surgery.  Any health problems.  RISKS AND COMPLICATIONS  Erectile dysfunction (impotence).  Bleeding.  Infection.  Reaction to anesthesia.  Involuntary leakage of urine (urinary incontinence).  BEFORE THE PROCEDURE  Ask your caregiver about changing or stopping your regular medicines. PROCEDURE  The procedure is performed under a general or spinal anesthetic.  An ultrasound is used to guide the long, thin metal tubes into the prostate gland. These tubes help place the seeds.  The metal tubes are then removed.  The seeds can be left in place permanently.  AFTER THE PROCEDURE  You will have a catheter in your bladder. The catheter will be removed later in your urologist's office.  Avoid being around children and pregnant women. Over time, the radiation will decrease and become inactive.  X-rays and CT scans may be taken to show the location of the seeds.  A specialist can  calculate the exact amount of radiation received.  What to expect: A little blood in the urine for a couple days is normal.  Your urine stream may be weaker for a couple weeks.  Document Released: 10/27/2005 Document Revised: 05/08/2011 Document Reviewed: 11/10/2005 [facility] Patient Information 2012 [place], [place]..Brachytherapy for Prostate Cancer Brachytherapy for prostate cancer is radiation treatment. It uses ultrasound to guide the insertion of tiny radioactive seeds into the prostate. This allows additional radiation to be given to the tumor and less radiation to the surrounding normal tissues. The seeds are about the thickness of a pencil lead and just over ? inch long. Brachytherapy is associated with fewer side effects than external beam radiation therapy alone. Usually, there is a delay of about 2 months after the seed implants before external radiation is begun, if external radiation is also used. LET YOUR CAREGIVER KNOW ABOUT:   Any allergies.   All medicines you are taking including vitamins, herbs, eyedrops, and over-the-counter medicines and creams.   Use of steroids (through mouth or as creams).   Previous problems with medicines that make a person sleep (general anesthetics) or numbing medicines.   History of bleeding or blood problems.  Previous surgery.   Any health problems.  RISKS AND COMPLICATIONS   Erectile dysfunction (impotence).   Bleeding.   Infection.   Reaction to anesthesia.   Involuntary leakage of urine (urinary incontinence).  BEFORE THE PROCEDURE  Ask your caregiver about changing or stopping your regular medicines. PROCEDURE   The procedure is performed under a general or spinal anesthetic.   An ultrasound is used to guide the long, thin metal tubes into the prostate gland. These tubes help place the seeds.   The metal tubes are then removed.   The seeds can be left in place permanently.  AFTER THE PROCEDURE   You will have a catheter  in your bladder. The catheter will be removed later in your urologist's office.   Avoid being around children and pregnant women. Over time, the radiation will decrease and become inactive.   X-rays and CT scans may be taken to show the location of the seeds.   A specialist can calculate the exact amount of radiation received.  What to expect:  A little blood in the urine for a couple days is normal.   Your urine stream may be weaker for a couple weeks.  Document Released: 10/27/2005 Document Revised: 05/08/2011 Document Reviewed: 11/10/2005   [facility] Patient Information 2012 [place], [place].

## 2011-08-08 NOTE — Op Note (Signed)
PATIENT:  Joseph Pham  PRE-OPERATIVE DIAGNOSIS:  Adenocarcinoma of the prostate  POST-OPERATIVE DIAGNOSIS:  Same  PROCEDURE:  Procedure(s): 1. I-125 radioactive seed implantation 2. Cystoscopy  SURGEON:  Surgeon(s): Garnett Farm  Radiation oncologist: Dr. 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 He was found to have induration in the apical region of his prostate on the right-hand side and an elevated PSA of 5.53 with a free to total ratio of only 5%. His biopsy proved positive for a total of 8 cores. Clinical stage T2a he has elected to proceed with radioactive seed implantation for treatment of his cancer.  Description of procedure: After informed consent the patient was brought to the major OR, placed on the table and administered general anesthesia. He was then moved to the modified lithotomy position with his perineum perpendicular to the floor. His perineum and genitalia were then sterilely prepped. An official timeout was then performed. A 16 French Foley catheter was then placed in the bladder and filled with dilute contrast, a rectal tube was placed in the rectum and the transrectal ultrasound probe was placed in the rectum and affixed to the stand. He was then sterilely draped.  Real time ultrasonography was used along with the seed planning software spot-pro version 3.1-00. This was used to develop the seed plan including the number of needles as well as number of seeds required for complete and adequate coverage. Real-time ultrasonography was then used along with the previously developed plan and the Nucletron device to implant a total of 50 seeds using 19 needles. This proceeded without difficulty or complication.  A Foley catheter was then removed as well as the transrectal ultrasound probe and rectal probe.  Flexible cystoscopy was then performed using the 17 French flexible scope which revealed a normal urethra throughout its length down to the sphincter which appeared intact. The prostatic urethra revealed bilobar hypertrophy but no evidence of obstruction, seeds, spacers or lesions. The bladder was then entered and fully and systematically inspected. The ureteral orifices were noted to be of normal configuration and position. The mucosa revealed no evidence of tumors. There were also no stones identified within the bladder. I noted no seeds or spacers on the floor of the bladder and retroflexion of the scope revealed no seeds protruding from the base of the prostate.  The cystoscope was then removed and a new 16 French Foley catheter was then inserted and the balloon was filled with 10 cc of sterile water. This was connected to closed system drainage and the patient was awakened and taken to recovery room in stable and satisfactory condition. He tolerated procedure well and there were no intraoperative complications.

## 2011-08-10 NOTE — Progress Notes (Signed)
  Radiation Oncology         [phone] ________________________________  Name: Joseph Pham MRN: [id number]  Date: 08/08/2011  DOB: 1950/10/29       Prostate Seed Implant  CC:   Ihor Gully, MD    Shanon Payor, M.D.        DIAGNOSIS: A 61 year old gentlemen with stage T2 A. adenocarcinoma of the prostate with a Gleason of 3+4 and a PSA of 5.5.  PROCEDURE: Insertion of radioactive I-125 seeds into the prostate gland.  RADIATION DOSE: 110 Gy, boost therapy.  TECHNIQUE: ZAK GONDEK was brought to the operating room with the urologist. He was placed in the dorsolithotomy position. He was catheterized and a rectal tube was inserted. The perineum was shaved, prepped and draped. The ultrasound probe was then introduced into the rectum to see the prostate gland.  TREATMENT DEVICE: A needle grid was attached to the ultrasound probe stand and anchor needles were placed.  COMPLEX ISODOSE CALCULATION: The prostate was imaged in 3D using a sagittal sweep of the prostate probe. These images were transferred to the planning computer. There, the prostate, urethra and rectum were defined on each axial reconstructed image. Then, the software created an optimized plan and a few seed positions were adjusted. Then the accepted plan was uploaded to the seed Selectron afterloading unit.  SPECIAL TREATMENT PROCEDURE/SUPERVISION AND HANDLING: The Nucletron FIRST system was used to place the needles under sagittal guidance. A total of 17 needles were used to deposit 50 seeds in the prostate gland. The individual seed activity was 0.422 mCi for a total implant activity of 21.1 mCi.  COMPLEX SIMULATION: At the end of the procedure, an anterior radiograph of the pelvis was obtained to document seed positioning and count. Cystoscopy was performed to check the urethra and bladder.  MICRODOSIMETRY: At the end of the procedure, the patient was emitting 0.07 mrem/hr at 1 meter. Accordingly, he was considered  safe for hospital discharge.  PLAN: The patient will return to the radiation oncology clinic for post implant CT dosimetry in three weeks.   ________________________________  Artist Pais Kathrynn Running, M.D.

## 2011-08-11 ENCOUNTER — Encounter (HOSPITAL_BASED_OUTPATIENT_CLINIC_OR_DEPARTMENT_OTHER): Payer: Self-pay | Admitting: Urology

## 2011-08-27 ENCOUNTER — Telehealth: Payer: Self-pay | Admitting: *Deleted

## 2011-08-27 NOTE — Telephone Encounter (Signed)
XXXX 

## 2011-08-28 ENCOUNTER — Ambulatory Visit
Admission: RE | Admit: 2011-08-28 | Discharge: 2011-08-28 | Disposition: A | Discharge status: Home / Self Care | Payer: BC Managed Care – PPO | Source: Ambulatory Visit | Attending: Radiation Oncology | Admitting: Radiation Oncology

## 2011-08-28 ENCOUNTER — Encounter: Payer: Self-pay | Admitting: Radiation Oncology

## 2011-08-28 VITALS — BP 131/79 | HR 76 | Temp 98.4°F | Resp 20 | Wt 275.8 lb

## 2011-08-28 DIAGNOSIS — Z79899 Other long term (current) drug therapy: Secondary | ICD-10-CM | POA: Insufficient documentation

## 2011-08-28 DIAGNOSIS — C61 Malignant neoplasm of prostate: Secondary | ICD-10-CM

## 2011-08-28 DIAGNOSIS — R3911 Hesitancy of micturition: Secondary | ICD-10-CM | POA: Insufficient documentation

## 2011-08-28 DIAGNOSIS — Y842 Radiological procedure and radiotherapy as the cause of abnormal reaction of the patient, or of later complication, without mention of misadventure at the time of the procedure: Secondary | ICD-10-CM | POA: Insufficient documentation

## 2011-08-28 DIAGNOSIS — R350 Frequency of micturition: Secondary | ICD-10-CM | POA: Insufficient documentation

## 2011-08-28 NOTE — Progress Notes (Signed)
Patient here post seed, I-PSS= [id number] score = 5, alert and oriented x 3, dysuria when first urinating, no c/o pain, loose stools once in a while stated patient, had diarrhea this am but ate brussel sprouts with vinegar , resolved on its own stated patient,  4:21 PM

## 2011-08-28 NOTE — Progress Notes (Signed)
  Radiation Oncology         [phone] ________________________________  Name: Joseph Pham MRN: [id number]  Date: 08/28/2011  DOB: 11-06-1950  Follow-Up Visit Note  CC:  Garnett Farm, MD  Diagnosis:   A 61 year old gentlemen with stage T2 A. adenocarcinoma of the prostate with a Gleason of 3+4 and a PSA of 5.5  Interval Since Last Radiation:  3 weeks  Narrative:  The patient returns today for routine follow-up.  He is complaining of increased urinary frequency and urinary hesitation symptoms. He filled out a questionnaire regarding urinary function today providing and overall IPSS score of 5 characterizing his symptoms as mild.  His pre-implant score was 5. He denies any bowel symptoms.  ALLERGIES:  is allergic to aspirin and amoxicillin.  Meds: Current Outpatient Prescriptions  Medication Sig Dispense Refill  . fexofenadine (ALLEGRA) 180 MG tablet Take 180 mg by mouth daily.      . fish oil-omega-3 fatty acids 1000 MG capsule Take 2 g by mouth daily.      . fluticasone (FLONASE) 50 MCG/ACT nasal spray Place 2 sprays into the nose daily.      . lansoprazole (PREVACID) 30 MG capsule Take 30 mg by mouth every morning.      . metoprolol succinate (TOPROL-XL) 25 MG 24 hr tablet Take 25 mg by mouth every morning.      . Multiple Vitamins-Minerals (MENS 50+ MULTI VITAMIN/MIN) TABS Take 1 tablet by mouth once.      . rosuvastatin (CRESTOR) 10 MG tablet Take 10 mg by mouth every evening.        Physical Findings: The patient is in no acute distress. Patient is alert and oriented.  vitals were not taken for this visit..  No significant changes.  Lab Findings: Lab Results  Component Value Date   WBC 5.3 08/04/2011   HGB 15.0 08/04/2011   HCT 43.0 08/04/2011   MCV 87.4 08/04/2011   PLT 175 08/04/2011    Radiographic Findings:  Patient underwent CT imaging in our clinic for post implant dosimetry. The CT appears to demonstrate an adequate distribution of radioactive seeds throughout  the prostate gland. There no seeds in her near the rectum. I suspect the final radiation plan and dosimetry will show appropriate coverage of the prostate gland.   Impression: The patient is recovering from the effects of radiation. His urinary symptoms should gradually improve over the next 4-6 months. We talked about this today. He is encouraged by his improvement already and is otherwise please with his outcome.   Plan: Today, I spent time talking to the patient about his prostate seed implant and resolving urinary symptoms. Which for long-term followup for prostate cancer following seed implant. He understands that ongoing PSA determinations and digital rectal exams will help perform surveillance to rule out disease recurrence. He understands what to expect with his PSA measures. Patient was also educated today about some of the long-term effects would radiation including the Small risk for rectal bleeding and possibly erectile dysfunction. Talked about some of the general management approaches to these potential complications. However, I did encourage the patient to contact her office or return at any point if he has questions or concerns related to his previous radiation and prostate cancer.   _____________________________________  Artist Pais. Kathrynn Running, M.D.

## 2011-09-08 ENCOUNTER — Encounter: Payer: Self-pay | Admitting: Radiation Oncology

## 2011-10-03 NOTE — Progress Notes (Signed)
  Radiation Oncology         [phone] ________________________________  Name: Joseph Pham MRN: [id number]  Date: 09/08/2011  DOB: [birth year]  3-D Planning Note Prostate Brachytherapy  Diagnosis: A 61 year old gentlemen with stage T2a adenocarcinoma of the prostate with a Gleason of 3+4 and a PSA of 5.5  Narrative: Joseph Pham returned following prostate seed implantation for post implant planning. He underwent CT scan to delineate the three-dimensional structures of the pelvis and demonstrate the radiation distribution.  Results:   Prostate Coverage - The dose of radiation delivered to the 90% or more of the prostate gland (D90) was 115.17% of the prescription dose. This exceeds our goal of greater than 90%. Rectal Sparing - The volume of rectal tissue receiving the prescription dose or higher was 0.04 cc. This falls under our thresholds tolerance of 1.0 cc.  Impression: The prostate seed implant appears to show adequate target coverage and appropriate rectal sparing.  Plan:  The patient will continue to follow with urology for ongoing PSA determinations. I would anticipate a high likelihood for local tumor control with minimal risk for rectal morbidity.   Artist Pais Kathrynn Running, M.D.

## 2012-03-11 ENCOUNTER — Other Ambulatory Visit: Payer: Self-pay | Admitting: Urology

## 2012-03-12 ENCOUNTER — Encounter (HOSPITAL_BASED_OUTPATIENT_CLINIC_OR_DEPARTMENT_OTHER): Payer: Self-pay | Admitting: *Deleted

## 2012-03-12 NOTE — Progress Notes (Signed)
NPO AFTER MN. ARRIVES AT 1100. NEEDS ISTAT AND KUB. CURRENT EKG IN EPIC AND CHART. WILL TAKE TOPROL, PREVACID, AND DILAUDID AM OF SURG W/ SIP OF WATER AND IF NEEDED ZOFRAN

## 2012-03-12 NOTE — H&P (Signed)
History of Present Illness         Adenocarcinoma of the prostate: His PSA in 10/11 was 5.9. A repeat PSA in 5/12 was noted to be 5.53 with a free to total ratio of 5%. He was also noted on DRE to have induration in the right apex of the prostate. Prostate vol. - 26.6cc. Path. - adenocarcinoma Gleason score 4+3 = 7 in 1 core, 3+4 = 7 in 4 cores and 3+3 = 6 in 3 cores for a total of 8/12 cores positive. Clinical stage T2a. Treatment: I-125 seed implant on 08/08/11.         Nephrolithiasis: He underwent left ureteroscopy and laser lithotripsy of a left UPJ stone as well as removal of stones in the lower pole of his left kidney in 9/10. The stone was calcium oxalate. He had lithotripsy of the stone previously without fragmentation. He has 5 stones in the right kidney.   Interval history: He started experiencing severe right flank pain about 2 hours prior to coming in. It is associated with nausea but no vomiting. He's not had any fever. His pain is severe it is not relieved by positional change.   Past Medical History Problems  1. History of  Hydronephrosis On The Left 591 2. History of  Radiation Therapy V15.3 3. History of  Ureteral Stone Left 592.1  Surgical History Problems  1. History of  Biopsy Of The Prostate Needle 2. History of  Cystoscopy With Insertion Of Ureteral Stent Left 3. History of  Cystoscopy With Pyeloscopy With Lithotripsy Left 4. History of  Hernia Repair 5. History of  Knee Surgery 6. History of  Lithotripsy Left  Current Meds 1. Cialis 20 MG Oral Tablet; TAKE 1 TABLET As Directed PRN; Therapy: 28Jun2013 to (Last  Rx:28Jun2013)  Requested for: 28Jun2013 2. Crestor 10 MG Oral Tablet; Therapy: (Recorded:28Mar2013) to 3. Fexofenadine HCl 180 MG Oral Tablet; Therapy: (Recorded:08Sep2010) to 4. Fluticasone Propionate 50 MCG/ACT Nasal Suspension; Therapy: 01Feb2013 to 5. Hydrocortisone Acetate 25 MG Rectal Suppository; INSERT 1 SUPPOSITORY RECTALLY 2  TIMES DAILY;  Therapy: 14Jan2013 to (Evaluate:10Jul2013)  Requested for: 28Jun2013; Last  Rx:28Jun2013 6. Lisinopril 10 MG Oral Tablet; Therapy: 23Apr2013 to 7. Multivitamin/Iron TABS; Therapy: (Recorded:08Sep2010) to 8. Potassium Citrate ER 10 MEQ (1080 MG) Oral Tablet Extended Release; TAKE 2 TABLETS AT  BEDTIME; Therapy: 24Oct2011 to (Evaluate:26Oct2013)  Requested for: 31Oct2012; Last  Rx:31Oct2012 9. Prevacid 15 MG Oral Capsule Delayed Release; Therapy: (Recorded:28Mar2013) to 10. Toprol XL 25 MG Oral Tablet Extended Release 24 Hour; Therapy: (Recorded:08Sep2010) to  Allergies Medication  1. Aspirin TABS 2. Trimox CAPS  Social History Problems  1. Caffeine Use 3 cans daily 2. Marital History - Currently Married 3. Occupation: truck Hospital doctor 4. History of  Tobacco Use V15.82 smoked two packs dailysmoked for 20 yearsquit smoking 15 years ago Denied  5. History of  Alcohol Use  Review of Systems Genitourinary, constitutional, skin, eye, otolaryngeal, hematologic/lymphatic, cardiovascular, pulmonary, endocrine, musculoskeletal, gastrointestinal, neurological and psychiatric system(s) were reviewed and pertinent findings if present are noted.  Genitourinary: dysuria.    Vitals Vital Signs  BMI Calculated: 43.39 BSA Calculated: 2.28 Weight: 270 lb  Blood Pressure: 116 / 74 Heart Rate: 87  Physical Exam Constitutional: Well nourished and well developed . No acute distress.  ENT:. The ears and nose are normal in appearance.  Neck: The appearance of the neck is normal and no neck mass is present.  Pulmonary: No respiratory distress and normal respiratory rhythm and effort.  Cardiovascular:.  No peripheral edema.  Skin: Normal skin turgor, no visible rash and no visible skin lesions.  Neuro/Psych:. Mood and affect are appropriate.   Results/Data  The following images/tracing/specimen were independently visualized:  KUB: He has multiple stones in the right kidney as well as what appears to  be one the left but also appears to have a 7 x 10 mm stone located at the ureteropelvic junction region on the right-hand side.    Assessment Assessed  1. 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 It is 10 x 7 mm in we've discussed the treatment options. Its size and location would make it a good candidate for lithotripsy but he did not have good response to lithotripsy in the past and therefore we discussed proceeding with ureteroscopic management of his stone. I went over the procedure with him in detail including its risks and palpitations, the alternatives and the probability of success. He understands and has elected to proceed. He also realizes the need for a stent after the procedure.   Plan      1. Begin tamsulosin. 2. I gave him a prescription for hydromorphone and Zofran. 3. He'll be scheduled for ureteroscopy and laser lithotripsy of his right UPJ stone.

## 2012-03-15 ENCOUNTER — Encounter (HOSPITAL_BASED_OUTPATIENT_CLINIC_OR_DEPARTMENT_OTHER)
Admission: RE | Disposition: A | Discharge status: Home / Self Care | Payer: Self-pay | Source: Ambulatory Visit | Attending: Urology

## 2012-03-15 ENCOUNTER — Ambulatory Visit (HOSPITAL_BASED_OUTPATIENT_CLINIC_OR_DEPARTMENT_OTHER)
Admission: RE | Admit: 2012-03-15 | Discharge: 2012-03-15 | Disposition: A | Discharge status: Home / Self Care | Payer: No Typology Code available for payment source | Source: Ambulatory Visit | Attending: Urology | Admitting: Urology

## 2012-03-15 ENCOUNTER — Encounter (HOSPITAL_BASED_OUTPATIENT_CLINIC_OR_DEPARTMENT_OTHER): Payer: Self-pay | Admitting: Anesthesiology

## 2012-03-15 ENCOUNTER — Encounter (HOSPITAL_BASED_OUTPATIENT_CLINIC_OR_DEPARTMENT_OTHER): Payer: Self-pay | Admitting: *Deleted

## 2012-03-15 ENCOUNTER — Ambulatory Visit (HOSPITAL_COMMUNITY): Payer: No Typology Code available for payment source

## 2012-03-15 ENCOUNTER — Ambulatory Visit (HOSPITAL_BASED_OUTPATIENT_CLINIC_OR_DEPARTMENT_OTHER): Payer: No Typology Code available for payment source | Admitting: Anesthesiology

## 2012-03-15 DIAGNOSIS — C61 Malignant neoplasm of prostate: Secondary | ICD-10-CM | POA: Insufficient documentation

## 2012-03-15 DIAGNOSIS — N201 Calculus of ureter: Secondary | ICD-10-CM

## 2012-03-15 DIAGNOSIS — Z79899 Other long term (current) drug therapy: Secondary | ICD-10-CM | POA: Insufficient documentation

## 2012-03-15 HISTORY — PX: URETEROSCOPY: SHX842

## 2012-03-15 HISTORY — DX: Unspecified hemorrhoids: K64.9

## 2012-03-15 HISTORY — DX: Calculus of ureter: N20.1

## 2012-03-15 HISTORY — DX: Unspecified abdominal pain: R10.9

## 2012-03-15 LAB — POCT I-STAT, CHEM 8
BUN: 28 mg/dL — ABNORMAL HIGH (ref 6–23)
Calcium, Ion: 1.18 mmol/L (ref 1.13–1.30)
Chloride: 102 mEq/L (ref 96–112)
Creatinine, Ser: 1.8 mg/dL — ABNORMAL HIGH (ref 0.50–1.35)
Glucose, Bld: 128 mg/dL — ABNORMAL HIGH (ref 70–99)
HCT: 43 % (ref 39.0–52.0)
Hemoglobin: 14.6 g/dL (ref 13.0–17.0)
Potassium: 4.8 mEq/L (ref 3.5–5.1)
Sodium: 132 mEq/L — ABNORMAL LOW (ref 135–145)
TCO2: 23 mmol/L (ref 0–100)

## 2012-03-15 SURGERY — URETEROSCOPY
Anesthesia: General | Site: Ureter | Laterality: Right | Wound class: Clean Contaminated

## 2012-03-15 MED ORDER — EPHEDRINE SULFATE 50 MG/ML IJ SOLN
INTRAMUSCULAR | Status: DC | PRN
  Administered 2012-03-15 (×4): 10 mg via INTRAVENOUS

## 2012-03-15 MED ORDER — PHENAZOPYRIDINE HCL 200 MG PO TABS: 200.0000 mg | ORAL_TABLET | Freq: Three times a day (TID) | ORAL | Status: DC | PRN

## 2012-03-15 MED ORDER — HYDROCODONE-ACETAMINOPHEN 10-325 MG PO TABS: 1.0000 | ORAL_TABLET | Freq: Four times a day (QID) | ORAL | Status: DC | PRN

## 2012-03-15 MED ORDER — IOHEXOL 350 MG/ML SOLN
INTRAVENOUS | Status: DC | PRN
  Administered 2012-03-15: 5 mL

## 2012-03-15 MED ORDER — PROPOFOL 10 MG/ML IV BOLUS
INTRAVENOUS | Status: DC | PRN
  Administered 2012-03-15: 200 mg via INTRAVENOUS

## 2012-03-15 MED ORDER — FENTANYL CITRATE 0.05 MG/ML IJ SOLN
INTRAMUSCULAR | Status: DC | PRN
  Administered 2012-03-15: 12.5 ug via INTRAVENOUS
  Administered 2012-03-15 (×5): 25 ug via INTRAVENOUS
  Administered 2012-03-15: 50 ug via INTRAVENOUS
  Administered 2012-03-15: 12.5 ug via INTRAVENOUS
  Administered 2012-03-15 (×2): 25 ug via INTRAVENOUS

## 2012-03-15 MED ORDER — LACTATED RINGERS IV SOLN
INTRAVENOUS | Status: DC
  Administered 2012-03-15 (×2): via INTRAVENOUS

## 2012-03-15 MED ORDER — BELLADONNA ALKALOIDS-OPIUM 16.2-60 MG RE SUPP
RECTAL | Status: DC | PRN
  Administered 2012-03-15: 1 via RECTAL

## 2012-03-15 MED ORDER — DEXAMETHASONE SODIUM PHOSPHATE 4 MG/ML IJ SOLN
INTRAMUSCULAR | Status: DC | PRN
  Administered 2012-03-15: 8 mg via INTRAVENOUS

## 2012-03-15 MED ORDER — CIPROFLOXACIN IN D5W 400 MG/200ML IV SOLN
400.0000 mg | INTRAVENOUS | Status: AC
  Administered 2012-03-15: 400 mg via INTRAVENOUS

## 2012-03-15 MED ORDER — ONDANSETRON HCL 4 MG/2ML IJ SOLN
INTRAMUSCULAR | Status: DC | PRN
  Administered 2012-03-15: 4 mg via INTRAVENOUS

## 2012-03-15 MED ORDER — LIDOCAINE HCL (CARDIAC) 20 MG/ML IV SOLN
INTRAVENOUS | Status: DC | PRN
  Administered 2012-03-15: 100 mg via INTRAVENOUS

## 2012-03-15 MED ORDER — SODIUM CHLORIDE 0.9 % IR SOLN
Status: DC | PRN
  Administered 2012-03-15: 9000 mL

## 2012-03-15 MED ORDER — MIDAZOLAM HCL 5 MG/5ML IJ SOLN
INTRAMUSCULAR | Status: DC | PRN
  Administered 2012-03-15: 2 mg via INTRAVENOUS

## 2012-03-15 SURGICAL SUPPLY — 36 items
ADAPTER CATH URET PLST 4-6FR (CATHETERS) IMPLANT
BAG DRAIN URO-CYSTO SKYTR STRL (DRAIN) ×3 IMPLANT
BASKET LASER NITINOL 1.9FR (BASKET) ×3 IMPLANT
BASKET STNLS GEMINI 4WIRE 3FR (BASKET) IMPLANT
BASKET ZERO TIP NITINOL 2.4FR (BASKET) ×3 IMPLANT
BRUSH URET BIOPSY 3F (UROLOGICAL SUPPLIES) IMPLANT
CANISTER SUCT LVC 12 LTR MEDI- (MISCELLANEOUS) ×3 IMPLANT
CATH INTERMIT  6FR 70CM (CATHETERS) IMPLANT
CATH URET 5FR 28IN CONE TIP (BALLOONS)
CATH URET 5FR 70CM CONE TIP (BALLOONS) IMPLANT
CLOTH BEACON ORANGE TIMEOUT ST (SAFETY) ×3 IMPLANT
DRAPE CAMERA CLOSED 9X96 (DRAPES) ×3 IMPLANT
ELECT REM PT RETURN 9FT ADLT (ELECTROSURGICAL)
ELECTRODE REM PT RTRN 9FT ADLT (ELECTROSURGICAL) IMPLANT
FLEXIVA TRAC TIP 200 ×3 IMPLANT
GLOVE BIO SURGEON STRL SZ8 (GLOVE) ×3 IMPLANT
GLOVE BIOGEL PI IND STRL 6.5 (GLOVE) ×4 IMPLANT
GLOVE BIOGEL PI INDICATOR 6.5 (GLOVE) ×2
GOWN PREVENTION PLUS LG XLONG (DISPOSABLE) ×3 IMPLANT
GOWN STRL REIN XL XLG (GOWN DISPOSABLE) ×3 IMPLANT
GUIDEWIRE 0.038 PTFE COATED (WIRE) IMPLANT
GUIDEWIRE ANG ZIPWIRE 038X150 (WIRE) IMPLANT
GUIDEWIRE STR DUAL SENSOR (WIRE) ×3 IMPLANT
IV NS IRRIG 3000ML ARTHROMATIC (IV SOLUTION) ×6 IMPLANT
KIT BALLIN UROMAX 15FX10 (LABEL) IMPLANT
KIT BALLN UROMAX 15FX4 (MISCELLANEOUS) IMPLANT
KIT BALLN UROMAX 26 75X4 (MISCELLANEOUS)
LASER FIBER DISP (UROLOGICAL SUPPLIES) IMPLANT
PACK CYSTOSCOPY (CUSTOM PROCEDURE TRAY) ×3 IMPLANT
SET HIGH PRES BAL DIL (LABEL)
SHEATH ACCESS URETERAL 38CM (SHEATH) ×3 IMPLANT
SHEATH ACCESS URETERAL 54CM (SHEATH) IMPLANT
SHEATH URET ACCESS 12FR/35CM (UROLOGICAL SUPPLIES) IMPLANT
SHEATH URET ACCESS 12FR/55CM (UROLOGICAL SUPPLIES) IMPLANT
STENT URET 6FRX26 CONTOUR (STENTS) ×3 IMPLANT
WATER STERILE IRR 3000ML UROMA (IV SOLUTION) IMPLANT

## 2012-03-15 NOTE — Anesthesia Procedure Notes (Signed)
Procedure Name: LMA Insertion Date/Time: 03/15/2012 11:35 AM Performed by: Norva Pavlov Pre-anesthesia Checklist: Patient identified, Emergency Drugs available, Suction available and Patient being monitored Patient Re-evaluated:Patient Re-evaluated prior to inductionOxygen Delivery Method: Circle System Utilized Preoxygenation: Pre-oxygenation with 100% oxygen Intubation Type: IV induction Ventilation: Mask ventilation without difficulty LMA: LMA with gastric port inserted LMA Size: 4.0 and 5.0 Number of attempts: 1 Placement Confirmation: positive ETCO2 Tube secured with: Tape Dental Injury: Teeth and Oropharynx as per pre-operative assessment

## 2012-03-15 NOTE — Anesthesia Postprocedure Evaluation (Signed)
Anesthesia Post Note  Patient: Joseph Pham  Procedure(s) Performed: Procedure(s) (LRB): URETEROSCOPY (Right) HOLMIUM LASER APPLICATION (N/A)  Anesthesia type: General  Patient location: PACU  Post pain: Pain level controlled  Post assessment: Post-op Vital signs reviewed  Last Vitals: BP 117/62  Pulse 91  Temp 36.7 C (Oral)  Resp 17  Ht 5\' 6"  (1.676 m)  Wt 263 lb 5 oz (119.438 kg)  BMI 42.50 kg/m2  SpO2 99%  Post vital signs: Reviewed  Level of consciousness: sedated  Complications: No apparent anesthesia complications

## 2012-03-15 NOTE — Interval H&P Note (Signed)
History and Physical Interval Note:  03/15/2012 11:16 AM  Joseph Pham  has presented today for surgery, with the diagnosis of right upj stone   The various methods of treatment have been discussed with the patient and family. After consideration of risks, benefits and other options for treatment, the patient has consented to  Procedure(s) (LRB) with comments: URETEROSCOPY (Right) - Right Ueteroscopy  HOLMIUM LASER APPLICATION (N/A) - and Holmium Laser Litho    as a surgical intervention .  The patient's history has been reviewed, patient examined, no change in status, stable for surgery.  I have reviewed the patient's chart and labs.  Questions were answered to the patient's satisfaction.     Garnett Farm

## 2012-03-15 NOTE — Anesthesia Preprocedure Evaluation (Addendum)
Anesthesia Evaluation  Patient identified by MRN, date of birth, ID band Patient awake    Reviewed: Allergy & Precautions, H&P , NPO status , Patient's Chart, lab work & pertinent test results, reviewed documented beta blocker date and time   Airway       Dental  (+) Dental Advisory Given   Pulmonary sleep apnea , former smoker,          Cardiovascular hypertension, Pt. on medications and Pt. on home beta blockers     Neuro/Psych negative neurological ROS  negative psych ROS   GI/Hepatic Neg liver ROS, GERD-  Medicated,  Endo/Other  Morbid obesity  Renal/GU negative Renal ROS     Musculoskeletal  (+) Arthritis -, Osteoarthritis,    Abdominal (+) + obese,   Peds  Hematology negative hematology ROS (+)   Anesthesia Other Findings   Reproductive/Obstetrics                         Anesthesia Physical Anesthesia Plan  ASA: III  Anesthesia Plan: General   Post-op Pain Management:    Induction: Intravenous  Airway Management Planned: LMA  Additional Equipment:   Intra-op Plan:   Post-operative Plan: Extubation in OR  Informed Consent: I have reviewed the patients History and Physical, chart, labs and discussed the procedure including the risks, benefits and alternatives for the proposed anesthesia with the patient or authorized representative who has indicated his/her understanding and acceptance.   Dental advisory given  Plan Discussed with: CRNA  Anesthesia Plan Comments:       Anesthesia Quick Evaluation

## 2012-03-15 NOTE — Transfer of Care (Signed)
Immediate Anesthesia Transfer of Care Note  Patient: [name]  Procedure(s) Performed: Procedure(s) (LRB): URETEROSCOPY (Right) HOLMIUM LASER APPLICATION (N/A)  Patient Location: PACU  Anesthesia Type: General  Level of Consciousness: awake, alert  and oriented  Airway & Oxygen Therapy: Patient Spontanous Breathing and Patient connected to face mask oxygen  Post-op Assessment: Report given to PACU RN and Post -op Vital signs reviewed and stable  Post vital signs: Reviewed and stable  Complications: No apparent anesthesia complications

## 2012-03-15 NOTE — Op Note (Signed)
PATIENT:  Joseph Pham  PRE-OPERATIVE DIAGNOSIS:  right Ureteral calculus  POST-OPERATIVE DIAGNOSIS: Same  PROCEDURE:  1. Cystoscopy with right retrograde pyelogram including interpretation. 2. Right ureteroscopy and laser lithotripsy with stone extraction. 3. Right double-J stent placement  SURGEON: Garnett Farm, MD  INDICATION: Joseph Pham is a 61 year old male who developed right renal colic and was evaluated with a KUB which revealed one of the stones in his right kidney had passed into the proximal right ureter measuring 7 x 10 mm. We discussed the treatment options and he has elected to proceed with ureteroscopic management of his stone.  ANESTHESIA:  General  EBL:  Minimal  DRAINS: 6 French, 26 cm stent (with string)  SPECIMEN:  Stone  DISPOSITION OF SPECIMEN:  Given to patient  DESCRIPTION OF PROCEDURE: The patient was taken to the major OR and placed on the table. General anesthesia was administered and then the patient was moved to the dorsal lithotomy position. The genitalia was sterilely prepped and draped. An official timeout was performed.  Initially the 22 French cystoscope with 12 lens was passed under direct vision down the urethra which is noted be normal. The prostatic urethra was noted be free of any lesions, seeds or other abnormality. The bladder was then entered and fully inspected. It was noted be free of any tumors stones or inflammatory lesions. Ureteral orifices were of normal configuration and position. A 6 French open-ended ureteral catheter was then passed through the cystoscope into the ureteral orifice in order to perform a right retrograde pyelogram.  A retrograde pyelogram was performed by injecting full-strength contrast up the right ureter under direct fluoroscopic control. It revealed a filling defect in the proximal ureter consistent with the stone seen on the preoperative KUB. The remainder of the ureter was noted to be normal as was the  intrarenal collecting system. I then passed a 0.038 inch floppy-tipped guidewire through the open ended catheter and into the area of the renal pelvis and this was left in place. The inner portion of a ureteral access sheath was then passed over the guidewire to gently dilate the intramural ureter after which I passed the full access sheath and removed the obturator as well as guidewire leaving the sheath in place. I then proceeded with ureteroscopy.  A 6 French flexible, digital ureteroscope was then passed up the access sheath and into the proximal ureter where the stone was identified.. The stone was felt to be too large to extract and therefore elected to proceed with laser lithotripsy. The 200  holmium laser fiber was used to fragment the stone. As I fragmented the stone it migrated proximally so I passed a Nitinol basket and engaged the stone holding it in place with a Nitinol basket I then used the laser to fragment the stone without risk of the fragments falling into the kidney. I then used the nitinol basket to extract all of the stone fragments and reinspection of the ureter ureteroscopically revealed no further stone fragments and no injury to the ureter. I then backloaded the cystoscope over the guidewire and passed the stent over the guidewire into the area of the renal pelvis. As the guidewire was removed good curl was noted in the renal pelvis. The bladder was drained and the cystoscope was then removed. The patient tolerated the procedure well no intraoperative complications.  PLAN OF CARE: Discharge to home after PACU  PATIENT DISPOSITION:  PACU - hemodynamically stable.

## 2012-03-16 ENCOUNTER — Encounter (HOSPITAL_BASED_OUTPATIENT_CLINIC_OR_DEPARTMENT_OTHER): Payer: Self-pay | Admitting: Urology

## 2012-04-07 ENCOUNTER — Encounter: Payer: Self-pay | Admitting: *Deleted

## 2012-06-02 HISTORY — PX: POLYPECTOMY: SHX149

## 2013-10-17 ENCOUNTER — Other Ambulatory Visit: Payer: Self-pay | Admitting: Urology

## 2013-10-25 ENCOUNTER — Encounter (HOSPITAL_BASED_OUTPATIENT_CLINIC_OR_DEPARTMENT_OTHER): Payer: Self-pay | Admitting: *Deleted

## 2013-10-25 NOTE — Progress Notes (Signed)
NPO AFTER MN. ARRIVE AT 1624. NEEDS ISTAT AND EKG. WILL TAKE TOPROL, PREVACID, DO FLONASE NASAL SPRAY AND IF NEEDED TAKE NUCYNTA AM DOS W/ SIPS OF WATER.

## 2013-10-28 ENCOUNTER — Ambulatory Visit (HOSPITAL_BASED_OUTPATIENT_CLINIC_OR_DEPARTMENT_OTHER): Admitting: Anesthesiology

## 2013-10-28 ENCOUNTER — Encounter (HOSPITAL_BASED_OUTPATIENT_CLINIC_OR_DEPARTMENT_OTHER)
Admission: RE | Disposition: A | Discharge status: Home / Self Care | Payer: Self-pay | Source: Ambulatory Visit | Attending: Urology

## 2013-10-28 ENCOUNTER — Encounter (HOSPITAL_BASED_OUTPATIENT_CLINIC_OR_DEPARTMENT_OTHER): Payer: Self-pay

## 2013-10-28 ENCOUNTER — Ambulatory Visit (HOSPITAL_BASED_OUTPATIENT_CLINIC_OR_DEPARTMENT_OTHER)
Admission: RE | Admit: 2013-10-28 | Discharge: 2013-10-28 | Disposition: A | Discharge status: Home / Self Care | Source: Ambulatory Visit | Attending: Urology | Admitting: Urology

## 2013-10-28 ENCOUNTER — Encounter (HOSPITAL_BASED_OUTPATIENT_CLINIC_OR_DEPARTMENT_OTHER): Admitting: Anesthesiology

## 2013-10-28 ENCOUNTER — Ambulatory Visit (HOSPITAL_COMMUNITY)

## 2013-10-28 DIAGNOSIS — Z923 Personal history of irradiation: Secondary | ICD-10-CM | POA: Insufficient documentation

## 2013-10-28 DIAGNOSIS — Z79899 Other long term (current) drug therapy: Secondary | ICD-10-CM | POA: Insufficient documentation

## 2013-10-28 DIAGNOSIS — Z87891 Personal history of nicotine dependence: Secondary | ICD-10-CM | POA: Insufficient documentation

## 2013-10-28 DIAGNOSIS — Z8546 Personal history of malignant neoplasm of prostate: Secondary | ICD-10-CM | POA: Insufficient documentation

## 2013-10-28 DIAGNOSIS — N201 Calculus of ureter: Secondary | ICD-10-CM

## 2013-10-28 DIAGNOSIS — Z886 Allergy status to analgesic agent status: Secondary | ICD-10-CM | POA: Insufficient documentation

## 2013-10-28 DIAGNOSIS — Z888 Allergy status to other drugs, medicaments and biological substances status: Secondary | ICD-10-CM | POA: Insufficient documentation

## 2013-10-28 DIAGNOSIS — N135 Crossing vessel and stricture of ureter without hydronephrosis: Secondary | ICD-10-CM | POA: Insufficient documentation

## 2013-10-28 HISTORY — DX: Presence of spectacles and contact lenses: Z97.3

## 2013-10-28 HISTORY — DX: Hematuria, unspecified: R31.9

## 2013-10-28 HISTORY — PX: HOLMIUM LASER APPLICATION: SHX5852

## 2013-10-28 HISTORY — PX: CYSTOSCOPY WITH URETEROSCOPY AND STENT PLACEMENT: SHX6377

## 2013-10-28 HISTORY — DX: Type 2 diabetes mellitus without complications: E11.9

## 2013-10-28 LAB — POCT I-STAT 4, (NA,K, GLUC, HGB,HCT)
Glucose, Bld: 112 mg/dL — ABNORMAL HIGH (ref 70–99)
HCT: 41 % (ref 39.0–52.0)
Hemoglobin: 13.9 g/dL (ref 13.0–17.0)
Potassium: 3.7 mEq/L (ref 3.7–5.3)
Sodium: 138 mEq/L (ref 137–147)

## 2013-10-28 SURGERY — CYSTOURETEROSCOPY, WITH STENT INSERTION
Anesthesia: General | Site: Ureter | Laterality: Right

## 2013-10-28 MED ORDER — ACETAMINOPHEN 10 MG/ML IV SOLN
INTRAVENOUS | Status: DC | PRN
  Administered 2013-10-28: 1000 mg via INTRAVENOUS

## 2013-10-28 MED ORDER — ONDANSETRON HCL 4 MG/2ML IJ SOLN
INTRAMUSCULAR | Status: DC | PRN
  Administered 2013-10-28: 4 mg via INTRAVENOUS

## 2013-10-28 MED ORDER — PHENAZOPYRIDINE HCL 200 MG PO TABS: 200.0000 mg | ORAL_TABLET | Freq: Three times a day (TID) | ORAL | Status: DC | PRN

## 2013-10-28 MED ORDER — PROPOFOL 10 MG/ML IV BOLUS
INTRAVENOUS | Status: DC | PRN
  Administered 2013-10-28 (×2): 50 mg via INTRAVENOUS
  Administered 2013-10-28: 300 mg via INTRAVENOUS

## 2013-10-28 MED ORDER — LACTATED RINGERS IV SOLN
INTRAVENOUS | Status: DC
  Administered 2013-10-28: 09:00:00 via INTRAVENOUS
  Filled 2013-10-28: qty 1000

## 2013-10-28 MED ORDER — IOHEXOL 350 MG/ML SOLN
INTRAVENOUS | Status: DC | PRN
  Administered 2013-10-28: 20 mL

## 2013-10-28 MED ORDER — KETOROLAC TROMETHAMINE 30 MG/ML IJ SOLN
INTRAMUSCULAR | Status: DC | PRN
  Administered 2013-10-28: 15 mg via INTRAVENOUS

## 2013-10-28 MED ORDER — MIDAZOLAM HCL 2 MG/2ML IJ SOLN
INTRAMUSCULAR | Status: AC
  Filled 2013-10-28: qty 2

## 2013-10-28 MED ORDER — FENTANYL CITRATE 0.05 MG/ML IJ SOLN
INTRAMUSCULAR | Status: DC | PRN
  Administered 2013-10-28: 50 ug via INTRAVENOUS

## 2013-10-28 MED ORDER — LIDOCAINE HCL (CARDIAC) 20 MG/ML IV SOLN
INTRAVENOUS | Status: DC | PRN
  Administered 2013-10-28: 100 mg via INTRAVENOUS

## 2013-10-28 MED ORDER — CIPROFLOXACIN IN D5W 200 MG/100ML IV SOLN
200.0000 mg | INTRAVENOUS | Status: AC
  Administered 2013-10-28: 200 mg via INTRAVENOUS
  Filled 2013-10-28: qty 100

## 2013-10-28 MED ORDER — SODIUM CHLORIDE 0.9 % IR SOLN
Status: DC | PRN
  Administered 2013-10-28: 4000 mL

## 2013-10-28 MED ORDER — LIDOCAINE HCL 2 % EX GEL
CUTANEOUS | Status: DC | PRN
  Administered 2013-10-28: 1 via URETHRAL

## 2013-10-28 MED ORDER — OXYCODONE HCL 10 MG PO TABS: 10.0000 mg | ORAL_TABLET | ORAL | Status: DC | PRN

## 2013-10-28 MED ORDER — FENTANYL CITRATE 0.05 MG/ML IJ SOLN
INTRAMUSCULAR | Status: AC
  Filled 2013-10-28: qty 4

## 2013-10-28 MED ORDER — MIDAZOLAM HCL 5 MG/5ML IJ SOLN
INTRAMUSCULAR | Status: DC | PRN
  Administered 2013-10-28: 2 mg via INTRAVENOUS

## 2013-10-28 SURGICAL SUPPLY — 42 items
ADAPTER CATH URET PLST 4-6FR (CATHETERS) IMPLANT
BAG DRAIN URO-CYSTO SKYTR STRL (DRAIN) ×2 IMPLANT
BALLN NEPHROSTOMY (BALLOONS) ×2
BALLOON NEPHROSTOMY (BALLOONS) ×1 IMPLANT
BASKET LASER NITINOL 1.9FR (BASKET) IMPLANT
BASKET STNLS GEMINI 4WIRE 3FR (BASKET) IMPLANT
BASKET ZERO TIP NITINOL 2.4FR (BASKET) ×2 IMPLANT
CANISTER SUCT LVC 12 LTR MEDI- (MISCELLANEOUS) IMPLANT
CATH INTERMIT  6FR 70CM (CATHETERS) IMPLANT
CATH URET 5FR 28IN CONE TIP (BALLOONS)
CATH URET 5FR 70CM CONE TIP (BALLOONS) IMPLANT
CLOTH BEACON ORANGE TIMEOUT ST (SAFETY) ×2 IMPLANT
CONTOUR ×2 IMPLANT
DRAPE CAMERA CLOSED 9X96 (DRAPES) ×2 IMPLANT
ELECT REM PT RETURN 9FT ADLT (ELECTROSURGICAL)
ELECTRODE REM PT RTRN 9FT ADLT (ELECTROSURGICAL) IMPLANT
FIBER LASER FLEXIVA 200 (UROLOGICAL SUPPLIES) ×2 IMPLANT
FIBER LASER FLEXIVA 365 (UROLOGICAL SUPPLIES) IMPLANT
FIBER LASER FLEXIVA 550 (UROLOGICAL SUPPLIES) IMPLANT
GLOVE BIO SURGEON STRL SZ8 (GLOVE) ×2 IMPLANT
GLOVE BIOGEL M STER SZ 6 (GLOVE) ×2 IMPLANT
GLOVE BIOGEL PI IND STRL 6.5 (GLOVE) ×2 IMPLANT
GLOVE BIOGEL PI INDICATOR 6.5 (GLOVE) ×2
GOWN PREVENTION PLUS LG XLONG (DISPOSABLE) ×2 IMPLANT
GOWN STRL REIN XL XLG (GOWN DISPOSABLE) ×2 IMPLANT
GOWN STRL REUS W/TWL LRG LVL3 (GOWN DISPOSABLE) ×2 IMPLANT
GOWN STRL REUS W/TWL XL LVL3 (GOWN DISPOSABLE) ×2 IMPLANT
GUIDEWIRE 0.038 PTFE COATED (WIRE) IMPLANT
GUIDEWIRE ANG ZIPWIRE 038X150 (WIRE) IMPLANT
GUIDEWIRE STR DUAL SENSOR (WIRE) ×2 IMPLANT
IV NS IRRIG 3000ML ARTHROMATIC (IV SOLUTION) ×4 IMPLANT
KIT BALLIN UROMAX 15FX10 (LABEL) IMPLANT
KIT BALLN UROMAX 15FX4 (MISCELLANEOUS) IMPLANT
KIT BALLN UROMAX 26 75X4 (MISCELLANEOUS)
PACK CYSTOSCOPY (CUSTOM PROCEDURE TRAY) ×2 IMPLANT
SET HIGH PRES BAL DIL (LABEL)
SHEATH ACCESS URETERAL 38CM (SHEATH) IMPLANT
SHEATH ACCESS URETERAL 54CM (SHEATH) IMPLANT
SHEATH URET ACCESS 12FR/35CM (UROLOGICAL SUPPLIES) ×2 IMPLANT
SHEATH URET ACCESS 12FR/55CM (UROLOGICAL SUPPLIES) IMPLANT
STENT URET 6FRX24 CONTOUR (STENTS) ×2 IMPLANT
WATER STERILE IRR 3000ML UROMA (IV SOLUTION) IMPLANT

## 2013-10-28 NOTE — Anesthesia Preprocedure Evaluation (Signed)
Anesthesia Evaluation  Patient identified by MRN, date of birth, ID band Patient awake    Reviewed: Allergy & Precautions, H&P , NPO status , Patient's Chart, lab work & pertinent test results, reviewed documented beta blocker date and time   Airway Mallampati: II TM Distance: >3 FB Neck ROM: Full    Dental  (+) Dental Advisory Given   Pulmonary sleep apnea , former smoker,  breath sounds clear to auscultation        Cardiovascular hypertension, Pt. on medications and Pt. on home beta blockers Rhythm:Regular Rate:Normal     Neuro/Psych negative neurological ROS  negative psych ROS   GI/Hepatic Neg liver ROS, GERD-  Medicated,  Endo/Other  diabetesMorbid obesity  Renal/GU negative Renal ROS     Musculoskeletal  (+) Arthritis -, Osteoarthritis,    Abdominal (+) + obese,   Peds  Hematology negative hematology ROS (+)   Anesthesia Other Findings   Reproductive/Obstetrics                           Anesthesia Physical  Anesthesia Plan  ASA: III  Anesthesia Plan: General   Post-op Pain Management:    Induction: Intravenous  Airway Management Planned: LMA  Additional Equipment:   Intra-op Plan:   Post-operative Plan: Extubation in OR  Informed Consent: I have reviewed the patients History and Physical, chart, labs and discussed the procedure including the risks, benefits and alternatives for the proposed anesthesia with the patient or authorized representative who has indicated his/her understanding and acceptance.   Dental advisory given  Plan Discussed with: CRNA  Anesthesia Plan Comments:         Anesthesia Quick Evaluation

## 2013-10-28 NOTE — H&P (Signed)
Joseph Pham is a 63 year old male with a right distal ureteral stone.   History of Present Illness Adenocarcinoma of the prostate: His PSA in 10/11 was 5.9. A repeat PSA in 5/12 was noted to be 5.53 with a free to total ratio of 5%. He was also noted on DRE to have induration in the right apex of the prostate. Prostate vol. - 26.6cc. Path. - adenocarcinoma Gleason score 4+3 = 7 in 1 core, 3+4 = 7 in 4 cores and 3+3 = 6 in 3 cores for a total of 8/12 cores positive. Clinical stage T2a.  Treatment: I-125 seed implant on 08/08/11.        Nephrolithiasis: He underwent left ureteroscopy and laser lithotripsy of a left UPJ stone as well as removal of stones in the lower pole of his left kidney in 9/10.   He had lithotripsy of the stone previously without fragmentation.    Right ureteroscopy and laser lithotripsy of a UPJ stone in10/13.  Stone analysis: 100% calcium oxalate   Stone risk: Normal serum studies. 24 hour urine showed hypocitraturia and low urine volume.     Interval history: Rt. distal ureteral stone ~700 HU which is resolved within gross hematuria and passage of occasional clots. He is not having any significant pain. He denies fever, chills, nausea or vomiting.    Past Medical History Problems  1. History of Calculus of ureter (592.1) 2. History of Hydronephrosis, left (591)  Surgical History Problems  1. History of Biopsy Of The Prostate Needle 2. History of Cystoscopy With Insertion Of Ureteral Stent Left 3. History of Cystoscopy With Insertion Of Ureteral Stent Right 4. History of Cystoscopy With Pyeloscopy With Lithotripsy 5. History of Cystoscopy With Ureteroscopy With Lithotripsy 6. History of Hernia Repair 7. History of Knee Surgery 8. History of Lithotripsy 9. History of Radiation Therapy  Current Meds 1. Allegra CAPS;  Therapy: (Recorded:14May2015) to Recorded 2. Atorvastatin Calcium 10 MG Oral Tablet;  Therapy: (Recorded:14May2015) to Recorded 3. CeleBREX  200 MG Oral Capsule;  Therapy: (Recorded:23Feb2015) to Recorded 4. Crestor 10 MG Oral Tablet;  Therapy: (Recorded:28Mar2013) to Recorded 5. Fish Oil CAPS;  Therapy: (Recorded:09Oct2013) to Recorded 6. Lisinopril 10 MG Oral Tablet;  Therapy: (Recorded:23Feb2015) to Recorded 7. Melatonin 5 MG Oral Capsule;  Therapy: (Recorded:23Feb2015) to Recorded 8. Multivitamin/Iron TABS;  Therapy: (Recorded:08Sep2010) to Recorded 9. Nucynta 50 MG Oral Tablet; TAKE 1 TABLET Every 6 hours PRN;  Therapy: [id number] to (Last Rx:14May2015) Ordered 10. Osteo Bi-Flex Regular Strength TABS;   Therapy: (Recorded:09Oct2013) to Recorded 11. Prevacid 15 MG Oral Capsule Delayed Release;   Therapy: (Recorded:28Mar2013) to Recorded  Allergies Medication  1. Aspirin TABS 2. Trimox CAPS  Family History Problems  1. No pertinent family history : Mother  Social History Problems  1. Denied: History of Alcohol Use 2. Caffeine Use   3 cans daily 3. Former smoker (V15.82) 4. Marital History - Currently Married 5. Occupation:   truck Geophysicist/field seismologist 6. History of Tobacco Use (V15.82)   smoked two packs dailysmoked for 20 yearsquit smoking 15 years ago  Review of Systems Genitourinary, constitutional, skin, eye, otolaryngeal, hematologic/lymphatic, cardiovascular, pulmonary, endocrine, musculoskeletal, gastrointestinal, neurological and psychiatric system(s) were reviewed and pertinent findings if present are noted.    Vitals  Blood Pressure  Blood Pressure: 130 / 79 Temperature  Temperature: 98.4 F Pulse  Heart Rate: 74  Physical Exam Constitutional: Well nourished and well developed. No acute distress.  ENT:. The ears and nose are normal in appearance.  Neck: The appearance of  the neck is normal and no neck mass is present.  Pulmonary: No respiratory distress and normal respiratory rhythm and effort.  Cardiovascular: Heart rate and rhythm are normal. No peripheral edema.  Abdomen: The abdomen is soft  and nontender. No masses are palpated. No CVA tenderness. No hernias are palpable. No hepatosplenomegaly noted.  . Genitourinary:  He had no CVA tenderness. Both testicles were normal. He had some rectal stenosis and his prostate was a little bit difficult to feel. Lymphatics: The femoral and inguinal nodes are not enlarged or tender.  Skin: Normal skin turgor, no visible rash and no visible skin lesions.  Neuro/Psych:. Mood and affect are appropriate.     CT-STONE PROTOCOL (Report)   ** RADIOLOGY REPORT BY [facility] **   CLINICAL DATA: Low back pain. History prostate cancer diagnosed in 2013 treated with brachytherapy.  EXAM: CT ABDOMEN AND PELVIS WITHOUT CONTRAST (URINARY CALCULUS PROTOCOL)  TECHNIQUE: Multidetector CT imaging was performed through the abdomen and pelvis without intravenous contrast to include the urinary tract.  COMPARISON: CT of the abdomen and pelvis 02/07/2009.  FINDINGS: Lung Bases: Unremarkable.  Abdomen/Pelvis: Several nonobstructive calculi are noted within the collecting systems of the kidneys bilaterally. The largest of these on the left measures 12 mm in the lower pole, the largest on the right measures 9 mm in the lower pole. In addition, in the distal third of the right ureter There are at least 2 calculi, largest of which is elongated measuring up to 1 cm in length. These are slightly proximal to the right ureterovesicular junction, and at this time there is no associated hydroureteronephrosis to suggest significant urinary tract obstruction. No left ureteral or bladder calculi are noted. There is a small amount of sessile soft tissue thickening along the posterior wall of the urinary bladder on the right side adjacent to the right ureterovesicular junction, which is poorly delineated on today's noncontrast CT examination (best demonstrated on image 69 of series 2). Whether or not this represents a small amount of dependent clot,  or a urothelial lesion is uncertain.  The unenhanced appearance of the visualized liver, gallbladder, pancreas, spleen and bilateral adrenal glands is unremarkable. Normal appendix. No significant volume of ascites. No pneumoperitoneum. No pathologic distention of small bowel. No definite lymphadenopathy identified within the abdomen or pelvis on today's non contrast CT examination. Atherosclerosis throughout the abdominal and pelvic vasculature, without definite aneurysm. Brachytherapy beads throughout the prostate gland. Small umbilical hernia containing a small amount of omental fat incidentally noted.  Musculoskeletal: There are no aggressive appearing lytic or blastic lesions noted in the visualized portions of the skeleton. Well-defined 1.8 x 1.1 cm sclerotic lesion with narrow zone of transition in the left side of the sacrum (image 57 of series 2) measures 1.8 x 1.1 cm, and is similar compared to the prior examination, favored to represent a bone island.  IMPRESSION: 1. There are at least 2 calculi in the distal third of the right ureter shortly before the right ureterovesicular junction, largest of which is elongated measuring 1 cm. At this time there is no associated hydroureteronephrosis to suggest significant urinary tract obstruction. 2. In addition, there are multiple nonobstructive calculi within the collecting systems of the kidneys bilaterally, as discussed above. 3. Nonspecific area of apparent sessile soft tissue thickening along the posterior aspect of the urinary bladder wall immediately adjacent to the right ureterovesicular junction. Whether or not this is a small amount of dependent clot, or a small urothelial lesion is uncertain  on today's non contrast CT examination. Clinical correlation is recommended, with consideration for cystoscopy if clinically appropriate.   Electronically Signed  By: Vinnie Langton M.D.  On: 10/13/2013  16:11     Assessment   We discussed the treatment options and had difficulty with his heart rate and sleep apnea with lithotripsy in the past and was reluctant to proceed with so we discussed treating his distal stone with ureteroscopy. I've gone over the procedure with him in detail. He's had this procedure performed previously and is 89 with a however I did discuss the potential risks and palpitations, the possibility of success, the outpatient nature of the procedure as well as the anticipated postoperative course. He understands and is elected to proceed.   Plan   He is scheduled for ureteroscopy and laser lithotripsy of his right distal ureteral stone.

## 2013-10-28 NOTE — Anesthesia Procedure Notes (Signed)
Procedure Name: LMA Insertion Date/Time: 10/28/2013 9:35 AM Performed by: Bethena Roys T Pre-anesthesia Checklist: Patient identified, Emergency Drugs available, Suction available and Patient being monitored Patient Re-evaluated:Patient Re-evaluated prior to inductionOxygen Delivery Method: Circle System Utilized Preoxygenation: Pre-oxygenation with 100% oxygen Intubation Type: IV induction Ventilation: Mask ventilation without difficulty LMA: LMA with gastric port inserted LMA Size: 5.0 Number of attempts: 1 Placement Confirmation: positive ETCO2 Tube secured with: Tape Dental Injury: Teeth and Oropharynx as per pre-operative assessment

## 2013-10-28 NOTE — Transfer of Care (Signed)
Immediate Anesthesia Transfer of Care Note  Patient: Joseph Pham  Procedure(s) Performed: Procedure(s): RIGHT URETEROSCOPY AND STENT PLACEMENT (Right) HOLMIUM LASER LITHO (Right)  Patient Location: PACU  Anesthesia Type:General  Level of Consciousness: sedated and responds to stimulation  Airway & Oxygen Therapy: Patient Spontanous Breathing and Patient connected to nasal cannula oxygen  Post-op Assessment: Report given to PACU RN  Post vital signs: Reviewed and stable  Complications: No apparent anesthesia complications

## 2013-10-28 NOTE — Anesthesia Postprocedure Evaluation (Signed)
Anesthesia Post Note  Patient: Joseph Pham  Procedure(s) Performed: Procedure(s) (LRB): RIGHT URETEROSCOPY AND STENT PLACEMENT (Right) HOLMIUM LASER LITHO (Right)  Anesthesia type: General  Patient location: PACU  Post pain: Pain level controlled  Post assessment: Post-op Vital signs reviewed  Last Vitals: BP 105/65  Pulse 52  Temp(Src) 36 C (Oral)  Resp 18  Ht 5\' 6"  (1.676 m)  Wt 256 lb (116.121 kg)  BMI 41.34 kg/m2  SpO2 96%  Post vital signs: Reviewed  Level of consciousness: sedated  Complications: No apparent anesthesia complications

## 2013-10-28 NOTE — Discharge Instructions (Signed)
Post stone removal/stent placement surgery instructions   Definitions:  Ureter: The duct that transports urine from the kidney to the bladder. Stent: A plastic hollow tube that is placed into the ureter, from the kidney to the bladder to prevent the ureter from swelling shut.  General instructions:  Despite the fact that no skin incisions were used, the area around the ureter and bladder is raw and irritated. The stent is a foreign body which will further irritate the bladder wall. This irritation is manifested by increased frequency of urination, both day and night, and by an increase in the urge to urinate. In some, the urge to urinate is present almost always. Sometimes the urge is strong enough that you may not be able to stop your self from urinating. The only real cure is to remove the stent and then give time for the bladder wall to heal which can't be done until the danger of the ureter swelling shut has passed. (This varies from 2-21 days).  You may see some blood in your urine while the stent is in place and a few days afterward. Do not be alarmed, even if the urine is clear for a while. Get off your feet and drink lots of fluids until clearing occurs. If you start to pass clots or don't improve, call us.  If you have a string coming from your urethra:  The stent string is attached to your ureteral stent.  Do not pull on thisIf you have a string coming from your urethra:  The stent string is attached to your ureteral stent.  Do not pull on this.  Diet:  You may return to your normal diet immediately. Because of the raw surface of your bladder, alcohol, spicy foods, foods high in acid and drinks with caffeine may cause irritation or frequency and should be used in moderation. To keep your urine flowing freely and avoid constipation, drink plenty of fluids during the day (8-10 glasses). Tip: Avoid cranberry juice because it is very acidic.  Activity:  Your physical activity doesn't need  to be restricted. However, if you are very active, you may see some blood in the urine. We suggest that you reduce your activity under the circumstances until the bleeding has stopped.  Bowels:  It is important to keep your bowels regular during the postoperative period. Straining with bowel movements can cause bleeding. A bowel movement every other day is reasonable. Use a mild laxative if needed, such as milk of magnesia 2-3 tablespoons, or 2 Dulcolax tablets. Call if you continue to have problems. If you had been taking narcotics for pain, before, during or after your surgery, you may be constipated. Take a laxative if necessary.     Medication:  You should resume your pre-surgery medications unless told not to. DO NOT RESUME YOUR ASPIRIN, or any other medicines like ibuprofen, motrin, excedrin, advil, aleve, vitamin E, fish oil as these can all cause bleeding x 7 days. In addition you may be given an antibiotic to prevent or treat infection. Antibiotics are not always necessary. All medication should be taken as prescribed until the bottles are finished unless you are having an unusual reaction to one of the drugs.  Problems you should report to [place]:  a. Fever greater than 101F. b. Heavy bleeding, or clots (see notes above about blood in urine). c. Inability to urinate. d. Drug reactions (hives, rash, nausea, vomiting, diarrhea). e. Severe burning or pain with urination that is not improving.  Followup:  You will need a followup appointment to monitor your progress in most cases. Please call the office for this appointment when you get home if your appointment has not already been scheduled. Usually the first appointment will be about 5-14 days after your surgery and if you have a stent in place it will likely be removed at that time. ° °Post Anesthesia Home Care Instructions ° °Activity: °Get plenty of rest for the remainder of the day. A responsible adult should stay with you for 24  hours following the procedure.  °For the next 24 hours, DO NOT: °-Drive a car °-Operate machinery °-Drink alcoholic beverages °-Take any medication unless instructed by your physician °-Make any legal decisions or sign important papers. ° °Meals: °Start with liquid foods such as gelatin or soup. Progress to regular foods as tolerated. Avoid greasy, spicy, heavy foods. If nausea and/or vomiting occur, drink only clear liquids until the nausea and/or vomiting subsides. Call your physician if vomiting continues. ° °Special Instructions/Symptoms: °Your throat may feel dry or sore from the anesthesia or the breathing tube placed in your throat during surgery. If this causes discomfort, gargle with warm salt water. The discomfort should disappear within 24 hours. ° ° °

## 2013-10-28 NOTE — Op Note (Signed)
PATIENT:  Joseph Pham  PRE-OPERATIVE DIAGNOSIS:  right Ureteral calculus  POST-OPERATIVE DIAGNOSIS: 1. Right ureteral calculus 2. Right distal ureteral stricture  PROCEDURE:  1. Cystoscopy with right retrograde pyelogram including interpretation. 2. Right ureteral stricture dilatation. 3. Right ureteroscopy and laser lithotripsy. 4. Right ureteral stone extraction. 5. Right double-J stent placement.  SURGEON: [name], MD  INDICATION: Joseph Pham is a 63 year old male with a history of calculus disease he developed gross hematuria without significant pain and was found to have an 8 mm stone in his distal right ureter. We discussed the treatment options and he has elected to proceed with ureteroscopic management.  ANESTHESIA:  General  EBL:  Minimal  DRAINS: 6 French, 24 cm double-J stent in the right ureter (with string)  SPECIMEN:  Stone given the patient  DESCRIPTION OF PROCEDURE: The patient was taken to the major OR and placed on the table. General anesthesia was administered and then the patient was moved to the dorsal lithotomy position. The genitalia was sterilely prepped and draped. An official timeout was performed.  Initially the 7 French cystoscope with 12 lens was passed under direct vision. The bladder was then entered and fully inspected. It was noted be free of any tumors stones or inflammatory lesions. Ureteral orifices were of normal configuration and position. A 6 French open-ended ureteral catheter was then passed through the cystoscope into the ureteral orifice in order to perform a right retrograde pyelogram.  A retrograde pyelogram was performed by injecting full-strength contrast up the right ureter under direct fluoroscopic control. It revealed a filling defect in the ureter consistent with the stone seen on the preoperative KUB with some dilatation of the ureter proximal to the stone. The remainder of the ureter was noted to be normal as was the  intrarenal collecting system. I then passed a 0.038 inch floppy-tipped guidewire through the open ended catheter and into the area of the renal pelvis and this was left in place. The inner portion of a ureteral access sheath was then passed over the guidewire to gently dilate the intramural ureter. But was unable to advance the inner portion of the access sheath up to the level of the stone.   I therefore removed the access sheath and passed a ureteral dilating balloon over the guidewire but  I was not able to advance this up to the level of the stone either. I therefore removed the dilating balloon and passed  the 6 French ureteroscope under direct vision into the right ureter alongside the guidewire and noted a relative stricture that allowed me to see beyond the stricture which means that urine was able to pass through this area unimpeded however I was unable to advance the scope through this area and did not want to force it. Do to this relative stricture I elected the dilated further. I removed the ureteroscope and passed the cystoscope sheath over the guidewire. I used this to stabilize the inner portion of the access sheath as I passed it over the guidewire and was then able with some mild pressure to pass this through the area and up to the level of the stone. After doing this I removed the access sheath leaving the guidewire in place and passed the dilating balloon over the guidewire and dilated the distal ureter. I removed the dilating balloon. I then proceeded with ureteroscopy.  A 6 French rigid ureteroscope was then passed under direct into the bladder and into the right orifice and up the ureter.  The stone was identified and I felt it was too large to extract and therefore elected to proceed with laser lithotripsy. The 200  holmium laser fiber was used to fragment the stone. I then used the nitinol basket to extract all of the stone fragments and reinspection of the ureter ureteroscopically  revealed no further stone fragments and no injury to the ureter. I then backloaded the cystoscope over the guidewire and passed the stent over the guidewire into the area of the renal pelvis. As the guidewire was removed good curl was noted in the renal pelvis. The bladder was drained and the cystoscope was then removed. The patient tolerated the procedure well no intraoperative complications.  PLAN OF CARE: Discharge to home after PACU  PATIENT DISPOSITION:  PACU - hemodynamically stable.

## 2013-10-31 ENCOUNTER — Encounter (HOSPITAL_BASED_OUTPATIENT_CLINIC_OR_DEPARTMENT_OTHER): Payer: Self-pay | Admitting: Urology

## 2013-12-16 ENCOUNTER — Other Ambulatory Visit: Payer: Self-pay | Admitting: Urology

## 2013-12-16 ENCOUNTER — Encounter (HOSPITAL_COMMUNITY): Payer: Self-pay

## 2013-12-16 ENCOUNTER — Encounter (HOSPITAL_COMMUNITY): Payer: Self-pay | Admitting: Pharmacy Technician

## 2013-12-19 ENCOUNTER — Encounter (HOSPITAL_COMMUNITY)
Admission: RE | Disposition: A | Discharge status: Home / Self Care | Payer: Self-pay | Source: Ambulatory Visit | Attending: Urology

## 2013-12-19 ENCOUNTER — Encounter (HOSPITAL_COMMUNITY): Payer: Self-pay | Admitting: *Deleted

## 2013-12-19 ENCOUNTER — Ambulatory Visit (HOSPITAL_COMMUNITY)

## 2013-12-19 ENCOUNTER — Ambulatory Visit (HOSPITAL_COMMUNITY)
Admission: RE | Admit: 2013-12-19 | Discharge: 2013-12-19 | Disposition: A | Discharge status: Home / Self Care | Source: Ambulatory Visit | Attending: Urology | Admitting: Urology

## 2013-12-19 DIAGNOSIS — N201 Calculus of ureter: Secondary | ICD-10-CM | POA: Insufficient documentation

## 2013-12-19 DIAGNOSIS — Z538 Procedure and treatment not carried out for other reasons: Secondary | ICD-10-CM | POA: Insufficient documentation

## 2013-12-19 LAB — GLUCOSE, CAPILLARY: Glucose-Capillary: 106 mg/dL — ABNORMAL HIGH (ref 70–99)

## 2013-12-19 SURGERY — LITHOTRIPSY, ESWL
Anesthesia: LOCAL | Laterality: Right

## 2013-12-19 MED ORDER — DIPHENHYDRAMINE HCL 25 MG PO CAPS
25.0000 mg | ORAL_CAPSULE | ORAL | Status: AC
  Administered 2013-12-19: 25 mg via ORAL
  Filled 2013-12-19: qty 1

## 2013-12-19 MED ORDER — CIPROFLOXACIN HCL 500 MG PO TABS
500.0000 mg | ORAL_TABLET | ORAL | Status: AC
  Administered 2013-12-19: 500 mg via ORAL
  Filled 2013-12-19: qty 1

## 2013-12-19 MED ORDER — DIAZEPAM 5 MG PO TABS
10.0000 mg | ORAL_TABLET | ORAL | Status: AC
  Administered 2013-12-19: 10 mg via ORAL
  Filled 2013-12-19: qty 2

## 2013-12-19 MED ORDER — SODIUM CHLORIDE 0.9 % IV SOLN
INTRAVENOUS | Status: DC
  Administered 2013-12-19: 11:00:00 via INTRAVENOUS

## 2013-12-19 NOTE — H&P (Signed)
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 A repeat PSA in 5/12 was noted to be 5.53 with a free to total ratio of 5%. He was also noted on DRE to have induration in the right apex of the prostate. Prostate vol. - 26.6cc. Path. - adenocarcinoma Gleason score 4+3 = 7 in 1 core, 3+4 = 7 in 4 cores and 3+3 = 6 in 3 cores for a total of 8/12 cores positive. Clinical stage T2a.  Treatment: I-125 seed implant on 08/08/11.        Nephrolithiasis: He underwent left ureteroscopy and laser lithotripsy of a left UPJ stone as well as removal of stones in the lower pole of his left kidney in 9/10.   He had lithotripsy of the stone previously without fragmentation.    Right ureteroscopy and laser lithotripsy of a UPJ stone in10/13.  Right ureteroscopy and laser lithotripsy in 5/15.  Stone analysis: 100% calcium oxalate   Stone risk: Normal serum studies. 24 hour urine showed hypocitraturia and low urine volume.     Interval history: He reports that about 3 weeks after his last ureteroscopy in 5/15 he had intercourse and then saw gross hematuria. That cleared after 24 hours. He did not have any further hematuria until yesterday. He said he mowed the long in the morning and did not see any blood but then was working in the backyard yesterday afternoon and again began to experience gross hematuria. He is not having any dysuria. He has no frequency or urgency but does report some mild discomfort in the back on the left-hand side although he had been working in the yard yesterday. He has a history of stones. He also has had prior radiation to his prostate but had no abnormalities of the prostate or bladder seen cystoscopically 2 months ago.    Past Medical History Problems  1. History of Hydronephrosis, left (591)  Surgical History Problems  1. History of Biopsy Of The Prostate Needle 2. History of Cystoscopy With Insertion Of Ureteral Stent Left 3. History  of Cystoscopy With Insertion Of Ureteral Stent Right 4. History of Cystoscopy With Insertion Of Ureteral Stent Right 5. History of Cystoscopy With Pyeloscopy With Lithotripsy 6. History of Cystoscopy With Ureteroscopy With Lithotripsy 7. History of Cystoscopy With Ureteroscopy With Lithotripsy 8. History of Cystourethroscopy W/ Ureteroscopy W/ Tx Of Ureteral Strict 9. History of Hernia Repair 10. History of Knee Surgery 11. History of Lithotripsy 12. History of Radiation Therapy  Current Meds 1. Allegra CAPS;  Therapy: (Recorded:14May2015) to Recorded 2. Atorvastatin Calcium 10 MG Oral Tablet;  Therapy: (Recorded:14May2015) to Recorded 3. CeleBREX 200 MG Oral Capsule;  Therapy: (Recorded:23Feb2015) to Recorded 4. Crestor 10 MG Oral Tablet;  Therapy: (Recorded:28Mar2013) to Recorded 5. Fish Oil CAPS;  Therapy: (Recorded:09Oct2013) to Recorded 6. Lisinopril 10 MG Oral Tablet;  Therapy: (Recorded:23Feb2015) to Recorded 7. Melatonin 5 MG Oral Capsule;  Therapy: (Recorded:23Feb2015) to Recorded 8. MetFORMIN HCl - 500 MG Oral Tablet;  Therapy: (Recorded:16Jul2015) to Recorded 9. Multivitamin/Iron TABS;  Therapy: (Recorded:08Sep2010) to Recorded 10. Nucynta 50 MG Oral Tablet; TAKE 1 TABLET Every 6 hours PRN;   Therapy: [id number] to (Last Rx:14May2015) Ordered 11. Osteo Bi-Flex Regular Strength TABS;   Therapy: (Recorded:09Oct2013) to Recorded 12. Prevacid 15 MG Oral Capsule Delayed Release;   Therapy: (Recorded:28Mar2013) to Recorded  Allergies Medication  1. Aspirin TABS 2. Trimox CAPS  Family History Problems  1. No pertinent family history : Mother  Social History Problems  1. Denied: History of Alcohol Use 2. Caffeine Use   3 cans daily 3. Former smoker (V15.82) 4. Marital History - Currently Married 5. Occupation:   truck Geophysicist/field seismologist 6. History of Tobacco Use (V15.82)   smoked two packs dailysmoked for 20 yearsquit smoking 15 years ago  Review of  Systems Genitourinary, constitutional, skin, eye, otolaryngeal, hematologic/lymphatic, cardiovascular, pulmonary, endocrine, musculoskeletal, gastrointestinal, neurological and psychiatric system(s) were reviewed and pertinent findings if present are noted.  Genitourinary: dysuria.    Vitals Vital Signs  Blood Pressure: 107 / 68 Temperature: 97.5 F Heart Rate: 69  Physical Exam Constitutional: Well nourished and well developed . No acute distress.  ENT:. The ears and nose are normal in appearance.  Neck: The appearance of the neck is normal and no neck mass is present.  Pulmonary: No respiratory distress and normal respiratory rhythm and effort.  Cardiovascular:. No peripheral edema.  Skin: Normal skin turgor, no visible rash and no visible skin lesions.  Neuro/Psych:. Mood and affect are appropriate.    The following images/tracing/specimen were independently visualized:  KUB: Bilateral renal calculi are again identified however one of the stones in his right kidney has now moved to the ureteropelvic junction region. It is irregular in shape which is why he is likely not having any pain and measures 13 mm.  The following clinical lab reports were reviewed:  UA: No white cells were noted in his urine but there were bacteria present. He was also nitrite positive. This may be due to the presence of the blood affecting the dipstick.       Assessment   We discussed the management of urinary stones. These options include observation, ureteroscopy, shockwave lithotripsy, and PCNL. We discussed which options are relevant to these particular stones. We discussed the natural history of stones as well as the complications of untreated stones and the impact on quality of life without treatment as well as with each of the above listed treatments. We also discussed the efficacy of each treatment in its ability to clear the stone burden. With any of these management options I discussed the signs and  symptoms of infection and the need for emergent treatment should these be experienced. For each option we discussed the ability of each procedure to clear the patient of their stone burden.    For observation I described the risks which include but are not limited to silent renal damage, life-threatening infection, need for emergent surgery, failure to pass stone, and pain.    For ureteroscopy I described the risks which include heart attack, stroke, pulmonary embolus, death, bleeding, infection, damage to contiguous structures, positioning injury, ureteral stricture, ureteral avulsion, ureteral injury, need for ureteral stent, inability to perform ureteroscopy, need for an interval procedure, inability to clear stone burden, stent discomfort and pain.    For shockwave lithotripsy I described the risks which include arrhythmia, kidney contusion, kidney hemorrhage, need for transfusion, long-term risk of diabetes or hypertension, back discomfort, flank ecchymosis, flank abrasion, inability to break up stone, inability to pass stone fragments, Steinstrasse, infection associated with obstructing stones, need for different surgical procedure, need for repeat shockwave lithotripsy, and death.    Although he has had lithotripsy in the past that he describes as being unsuccessful his stone is high and relatively faint indicating that it is not particularly dense scar think he would likely have a good result from lithotripsy. I have gone over the procedure with him and he has elected to proceed.  Plan   1. His urine will be cultured today.  2. He will be scheduled for lithotripsy of his right UPJ stone.

## 2013-12-19 NOTE — Discharge Instructions (Signed)

## 2013-12-19 NOTE — Progress Notes (Signed)
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 He did not receive sedation. Spoke with Dr. Karsten Ro, may discharge patient and be rescheduled. Instructed to patient to call [facility] this afternoon. Verbalized understanding.

## 2013-12-21 ENCOUNTER — Encounter (HOSPITAL_COMMUNITY): Payer: Self-pay

## 2013-12-23 ENCOUNTER — Other Ambulatory Visit: Payer: Self-pay | Admitting: Urology

## 2013-12-25 NOTE — Interval H&P Note (Signed)
History and Physical Interval Note:  12/25/2013 3:34 PM  Joseph Pham  has presented today for surgery, with the diagnosis of RIGHT URETERAL PELVIC JUNCTION STONE  The various methods of treatment have been discussed with the patient and family. After consideration of risks, benefits and other options for treatment, the patient has consented to  Procedure(s): RIGHT EXTRACORPOREAL SHOCK WAVE LITHOTRIPSY (ESWL) (Right) as a surgical intervention .  The patient's history has been reviewed, patient examined, no change in status, stable for surgery.  I have reviewed the patient's chart and labs.  Questions were answered to the patient's satisfaction.    His urine culture was found to be negative.   [name]

## 2013-12-25 NOTE — H&P (Deleted)
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 A repeat PSA in 5/12 was noted to be 5.53 with a free to total ratio of 5%. He was also noted on DRE to have induration in the right apex of the prostate. Prostate vol. - 26.6cc. Path. - adenocarcinoma Gleason score 4+3 = 7 in 1 core, 3+4 = 7 in 4 cores and 3+3 = 6 in 3 cores for a total of 8/12 cores positive. Clinical stage T2a.  Treatment: I-125 seed implant on 08/08/11.        Nephrolithiasis: He underwent left ureteroscopy and laser lithotripsy of a left UPJ stone as well as removal of stones in the lower pole of his left kidney in 9/10.   He had lithotripsy of the stone previously without fragmentation.    Right ureteroscopy and laser lithotripsy of a UPJ stone in10/13.  Right ureteroscopy and laser lithotripsy in 5/15.  Stone analysis: 100% calcium oxalate   Stone risk: Normal serum studies. 24 hour urine showed hypocitraturia and low urine volume.     Interval history: He reports that about 3 weeks after his last ureteroscopy in 5/15 he had intercourse and then saw gross hematuria. That cleared after 24 hours. He did not have any further hematuria until yesterday. He said he mowed the long in the morning and did not see any blood but then was working in the backyard yesterday afternoon and again began to experience gross hematuria. He is not having any dysuria. He has no frequency or urgency but does report some mild discomfort in the back on the left-hand side although he had been working in the yard yesterday. He has a history of stones. He also has had prior radiation to his prostate but had no abnormalities of the prostate or bladder seen cystoscopically 2 months ago.    Past Medical History Problems  1. History of Hydronephrosis, left (591)  Surgical History Problems  1. History of Biopsy Of The Prostate Needle 2. History of Cystoscopy With Insertion Of Ureteral Stent Left 3. History  of Cystoscopy With Insertion Of Ureteral Stent Right 4. History of Cystoscopy With Insertion Of Ureteral Stent Right 5. History of Cystoscopy With Pyeloscopy With Lithotripsy 6. History of Cystoscopy With Ureteroscopy With Lithotripsy 7. History of Cystoscopy With Ureteroscopy With Lithotripsy 8. History of Cystourethroscopy W/ Ureteroscopy W/ Tx Of Ureteral Strict 9. History of Hernia Repair 10. History of Knee Surgery 11. History of Lithotripsy 12. History of Radiation Therapy  Current Meds 1. Allegra CAPS;  Therapy: (Recorded:14May2015) to Recorded 2. Atorvastatin Calcium 10 MG Oral Tablet;  Therapy: (Recorded:14May2015) to Recorded 3. CeleBREX 200 MG Oral Capsule;  Therapy: (Recorded:23Feb2015) to Recorded 4. Crestor 10 MG Oral Tablet;  Therapy: (Recorded:28Mar2013) to Recorded 5. Fish Oil CAPS;  Therapy: (Recorded:09Oct2013) to Recorded 6. Lisinopril 10 MG Oral Tablet;  Therapy: (Recorded:23Feb2015) to Recorded 7. Melatonin 5 MG Oral Capsule;  Therapy: (Recorded:23Feb2015) to Recorded 8. MetFORMIN HCl - 500 MG Oral Tablet;  Therapy: (Recorded:16Jul2015) to Recorded 9. Multivitamin/Iron TABS;  Therapy: (Recorded:08Sep2010) to Recorded 10. Nucynta 50 MG Oral Tablet; TAKE 1 TABLET Every 6 hours PRN;   Therapy: [id number] to (Last Rx:14May2015) Ordered 11. Osteo Bi-Flex Regular Strength TABS;   Therapy: (Recorded:09Oct2013) to Recorded 12. Prevacid 15 MG Oral Capsule Delayed Release;   Therapy: (Recorded:28Mar2013) to Recorded  Allergies Medication  1. Aspirin TABS 2. Trimox CAPS  Family History Problems  1. No pertinent family history : Mother  Social History Problems  1. Denied: History of Alcohol Use 2. Caffeine Use   3 cans daily 3. Former smoker (V15.82) 4. Marital History - Currently Married 5. Occupation:   truck Geophysicist/field seismologist 6. History of Tobacco Use (V15.82)   smoked two packs dailysmoked for 20 yearsquit smoking 15 years ago  Review of  Systems Genitourinary and gastrointestinal system(s) were reviewed and pertinent findings if present are noted.  Genitourinary: dysuria and erectile dysfunction, but no hematuria.  Gastrointestinal: nausea and flank pain.  Constitutional: no recent weight loss.  Musculoskeletal: no bone pain.    Vitals Vital Signs Blood Pressure: 107 / 68 Temperature: 97.5 F Heart Rate: 69  Physical Exam Constitutional: Well nourished and well developed . No acute distress.  ENT:. The ears and nose are normal in appearance.  Neck: The appearance of the neck is normal and no neck mass is present.  Pulmonary: No respiratory distress and normal respiratory rhythm and effort.  Cardiovascular:. No peripheral edema.  Skin: Normal skin turgor, no visible rash and no visible skin lesions.  Neuro/Psych:. Mood and affect are appropriate.   The following images/tracing/specimen were independently visualized:  KUB: Bilateral renal calculi are again identified however one of the stones in his right kidney has now moved to the ureteropelvic junction region. It is irregular in shape which is why he is likely not having any pain and measures 13 mm.  The following clinical lab reports were reviewed:  UA: No white cells were noted in his urine but there were bacteria present. He was also nitrite positive. This may be due to the presence of the blood affecting the dipstick.       Assessment   We discussed the management of urinary stones. These options include observation, ureteroscopy, shockwave lithotripsy, and PCNL. We discussed which options are relevant to these particular stones. We discussed the natural history of stones as well as the complications of untreated stones and the impact on quality of life without treatment as well as with each of the above listed treatments. We also discussed the efficacy of each treatment in its ability to clear the stone burden. With any of these management options I discussed the  signs and symptoms of infection and the need for emergent treatment should these be experienced. For each option we discussed the ability of each procedure to clear the patient of their stone burden.    For observation I described the risks which include but are not limited to silent renal damage, life-threatening infection, need for emergent surgery, failure to pass stone, and pain.    For ureteroscopy I described the risks which include heart attack, stroke, pulmonary embolus, death, bleeding, infection, damage to contiguous structures, positioning injury, ureteral stricture, ureteral avulsion, ureteral injury, need for ureteral stent, inability to perform ureteroscopy, need for an interval procedure, inability to clear stone burden, stent discomfort and pain.    For shockwave lithotripsy I described the risks which include arrhythmia, kidney contusion, kidney hemorrhage, need for transfusion, long-term risk of diabetes or hypertension, back discomfort, flank ecchymosis, flank abrasion, inability to break up stone, inability to pass stone fragments, Steinstrasse, infection associated with obstructing stones, need for different surgical procedure, need for repeat shockwave lithotripsy, and death.    Although he has had lithotripsy in the past that he describes as being unsuccessful his stone is high and relatively faint indicating that it is not particularly dense scar think he would likely have a good result from lithotripsy. I have gone over the  procedure with him and he has elected to proceed.   Plan   1. His urine was cultured and found to be negative.  2. He is scheduled for lithotripsy of his right UPJ stone.

## 2013-12-25 NOTE — Discharge Instructions (Signed)

## 2013-12-25 NOTE — H&P (View-Only) (Signed)
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 A repeat PSA in 5/12 was noted to be 5.53 with a free to total ratio of 5%. He was also noted on DRE to have induration in the right apex of the prostate. Prostate vol. - 26.6cc. Path. - adenocarcinoma Gleason score 4+3 = 7 in 1 core, 3+4 = 7 in 4 cores and 3+3 = 6 in 3 cores for a total of 8/12 cores positive. Clinical stage T2a.  Treatment: I-125 seed implant on 08/08/11.        Nephrolithiasis: He underwent left ureteroscopy and laser lithotripsy of a left UPJ stone as well as removal of stones in the lower pole of his left kidney in 9/10.   He had lithotripsy of the stone previously without fragmentation.    Right ureteroscopy and laser lithotripsy of a UPJ stone in10/13.  Right ureteroscopy and laser lithotripsy in 5/15.  Stone analysis: 100% calcium oxalate   Stone risk: Normal serum studies. 24 hour urine showed hypocitraturia and low urine volume.     Interval history: He reports that about 3 weeks after his last ureteroscopy in 5/15 he had intercourse and then saw gross hematuria. That cleared after 24 hours. He did not have any further hematuria until yesterday. He said he mowed the long in the morning and did not see any blood but then was working in the backyard yesterday afternoon and again began to experience gross hematuria. He is not having any dysuria. He has no frequency or urgency but does report some mild discomfort in the back on the left-hand side although he had been working in the yard yesterday. He has a history of stones. He also has had prior radiation to his prostate but had no abnormalities of the prostate or bladder seen cystoscopically 2 months ago.    Past Medical History Problems  1. History of Hydronephrosis, left (591)  Surgical History Problems  1. History of Biopsy Of The Prostate Needle 2. History of Cystoscopy With Insertion Of Ureteral Stent Left 3. History  of Cystoscopy With Insertion Of Ureteral Stent Right 4. History of Cystoscopy With Insertion Of Ureteral Stent Right 5. History of Cystoscopy With Pyeloscopy With Lithotripsy 6. History of Cystoscopy With Ureteroscopy With Lithotripsy 7. History of Cystoscopy With Ureteroscopy With Lithotripsy 8. History of Cystourethroscopy W/ Ureteroscopy W/ Tx Of Ureteral Strict 9. History of Hernia Repair 10. History of Knee Surgery 11. History of Lithotripsy 12. History of Radiation Therapy  Current Meds 1. Allegra CAPS;  Therapy: (Recorded:14May2015) to Recorded 2. Atorvastatin Calcium 10 MG Oral Tablet;  Therapy: (Recorded:14May2015) to Recorded 3. CeleBREX 200 MG Oral Capsule;  Therapy: (Recorded:23Feb2015) to Recorded 4. Crestor 10 MG Oral Tablet;  Therapy: (Recorded:28Mar2013) to Recorded 5. Fish Oil CAPS;  Therapy: (Recorded:09Oct2013) to Recorded 6. Lisinopril 10 MG Oral Tablet;  Therapy: (Recorded:23Feb2015) to Recorded 7. Melatonin 5 MG Oral Capsule;  Therapy: (Recorded:23Feb2015) to Recorded 8. MetFORMIN HCl - 500 MG Oral Tablet;  Therapy: (Recorded:16Jul2015) to Recorded 9. Multivitamin/Iron TABS;  Therapy: (Recorded:08Sep2010) to Recorded 10. Nucynta 50 MG Oral Tablet; TAKE 1 TABLET Every 6 hours PRN;   Therapy: [id number] to (Last Rx:14May2015) Ordered 11. Osteo Bi-Flex Regular Strength TABS;   Therapy: (Recorded:09Oct2013) to Recorded 12. Prevacid 15 MG Oral Capsule Delayed Release;   Therapy: (Recorded:28Mar2013) to Recorded  Allergies Medication  1. Aspirin TABS 2. Trimox CAPS  Family History Problems  1. No pertinent family history : Mother  Social History Problems  1. Denied: History of Alcohol Use 2. Caffeine Use   3 cans daily 3. Former smoker (V15.82) 4. Marital History - Currently Married 5. Occupation:   truck Geophysicist/field seismologist 6. History of Tobacco Use (V15.82)   smoked two packs dailysmoked for 20 yearsquit smoking 15 years ago  Review of  Systems Genitourinary, constitutional, skin, eye, otolaryngeal, hematologic/lymphatic, cardiovascular, pulmonary, endocrine, musculoskeletal, gastrointestinal, neurological and psychiatric system(s) were reviewed and pertinent findings if present are noted.  Genitourinary: dysuria.    Vitals Vital Signs  Blood Pressure: 107 / 68 Temperature: 97.5 F Heart Rate: 69  Physical Exam Constitutional: Well nourished and well developed . No acute distress.  ENT:. The ears and nose are normal in appearance.  Neck: The appearance of the neck is normal and no neck mass is present.  Pulmonary: No respiratory distress and normal respiratory rhythm and effort.  Cardiovascular:. No peripheral edema.  Skin: Normal skin turgor, no visible rash and no visible skin lesions.  Neuro/Psych:. Mood and affect are appropriate.    The following images/tracing/specimen were independently visualized:  KUB: Bilateral renal calculi are again identified however one of the stones in his right kidney has now moved to the ureteropelvic junction region. It is irregular in shape which is why he is likely not having any pain and measures 13 mm.  The following clinical lab reports were reviewed:  UA: No white cells were noted in his urine but there were bacteria present. He was also nitrite positive. This may be due to the presence of the blood affecting the dipstick.       Assessment   We discussed the management of urinary stones. These options include observation, ureteroscopy, shockwave lithotripsy, and PCNL. We discussed which options are relevant to these particular stones. We discussed the natural history of stones as well as the complications of untreated stones and the impact on quality of life without treatment as well as with each of the above listed treatments. We also discussed the efficacy of each treatment in its ability to clear the stone burden. With any of these management options I discussed the signs and  symptoms of infection and the need for emergent treatment should these be experienced. For each option we discussed the ability of each procedure to clear the patient of their stone burden.    For observation I described the risks which include but are not limited to silent renal damage, life-threatening infection, need for emergent surgery, failure to pass stone, and pain.    For ureteroscopy I described the risks which include heart attack, stroke, pulmonary embolus, death, bleeding, infection, damage to contiguous structures, positioning injury, ureteral stricture, ureteral avulsion, ureteral injury, need for ureteral stent, inability to perform ureteroscopy, need for an interval procedure, inability to clear stone burden, stent discomfort and pain.    For shockwave lithotripsy I described the risks which include arrhythmia, kidney contusion, kidney hemorrhage, need for transfusion, long-term risk of diabetes or hypertension, back discomfort, flank ecchymosis, flank abrasion, inability to break up stone, inability to pass stone fragments, Steinstrasse, infection associated with obstructing stones, need for different surgical procedure, need for repeat shockwave lithotripsy, and death.    Although he has had lithotripsy in the past that he describes as being unsuccessful his stone is high and relatively faint indicating that it is not particularly dense scar think he would likely have a good result from lithotripsy. I have gone over the procedure with him and he has elected to proceed.  Plan   1. His urine will be cultured today.  2. He will be scheduled for lithotripsy of his right UPJ stone.

## 2013-12-26 ENCOUNTER — Ambulatory Visit (HOSPITAL_COMMUNITY)

## 2013-12-26 ENCOUNTER — Ambulatory Visit (HOSPITAL_COMMUNITY)
Admission: RE | Admit: 2013-12-26 | Discharge: 2013-12-26 | Disposition: A | Discharge status: Home / Self Care | Source: Ambulatory Visit | Attending: Urology | Admitting: Urology

## 2013-12-26 ENCOUNTER — Encounter (HOSPITAL_COMMUNITY): Payer: Self-pay | Admitting: *Deleted

## 2013-12-26 ENCOUNTER — Encounter (HOSPITAL_COMMUNITY)
Admission: RE | Disposition: A | Discharge status: Home / Self Care | Payer: Self-pay | Source: Ambulatory Visit | Attending: Urology

## 2013-12-26 DIAGNOSIS — E119 Type 2 diabetes mellitus without complications: Secondary | ICD-10-CM | POA: Insufficient documentation

## 2013-12-26 DIAGNOSIS — I10 Essential (primary) hypertension: Secondary | ICD-10-CM | POA: Insufficient documentation

## 2013-12-26 DIAGNOSIS — N200 Calculus of kidney: Secondary | ICD-10-CM | POA: Insufficient documentation

## 2013-12-26 DIAGNOSIS — Z87891 Personal history of nicotine dependence: Secondary | ICD-10-CM | POA: Insufficient documentation

## 2013-12-26 DIAGNOSIS — N201 Calculus of ureter: Secondary | ICD-10-CM

## 2013-12-26 DIAGNOSIS — Z79899 Other long term (current) drug therapy: Secondary | ICD-10-CM | POA: Insufficient documentation

## 2013-12-26 DIAGNOSIS — C61 Malignant neoplasm of prostate: Secondary | ICD-10-CM | POA: Insufficient documentation

## 2013-12-26 LAB — GLUCOSE, CAPILLARY: Glucose-Capillary: 93 mg/dL (ref 70–99)

## 2013-12-26 SURGERY — LITHOTRIPSY, ESWL
Anesthesia: LOCAL | Laterality: Right

## 2013-12-26 MED ORDER — OXYCODONE-ACETAMINOPHEN 10-325 MG PO TABS: 1.0000 | ORAL_TABLET | ORAL | Status: DC | PRN

## 2013-12-26 MED ORDER — TAMSULOSIN HCL 0.4 MG PO CAPS: 0.4000 mg | ORAL_CAPSULE | ORAL | Status: DC

## 2013-12-26 MED ORDER — DIPHENHYDRAMINE HCL 25 MG PO CAPS
25.0000 mg | ORAL_CAPSULE | ORAL | Status: AC
  Administered 2013-12-26: 25 mg via ORAL
  Filled 2013-12-26: qty 1

## 2013-12-26 MED ORDER — SODIUM CHLORIDE 0.9 % IV SOLN
INTRAVENOUS | Status: DC
  Administered 2013-12-26: 08:00:00 via INTRAVENOUS

## 2013-12-26 MED ORDER — CIPROFLOXACIN HCL 500 MG PO TABS
500.0000 mg | ORAL_TABLET | ORAL | Status: AC
  Administered 2013-12-26: 500 mg via ORAL
  Filled 2013-12-26: qty 1

## 2013-12-26 MED ORDER — DIAZEPAM 5 MG PO TABS
10.0000 mg | ORAL_TABLET | ORAL | Status: AC
  Administered 2013-12-26: 10 mg via ORAL
  Filled 2013-12-26: qty 2

## 2013-12-26 NOTE — Op Note (Signed)
See [facility] OP note scanned into chart. 

## 2015-04-24 ENCOUNTER — Other Ambulatory Visit: Payer: Self-pay | Admitting: Emergency Medicine

## 2015-04-24 MED ORDER — TORSEMIDE 20 MG PO TABS: 20.0000 mg | ORAL_TABLET | Freq: Every day | ORAL | Status: DC

## 2015-04-30 ENCOUNTER — Other Ambulatory Visit: Payer: Self-pay | Admitting: Emergency Medicine

## 2015-08-16 DIAGNOSIS — IMO0002 Reserved for concepts with insufficient information to code with codable children: Secondary | ICD-10-CM

## 2015-08-16 HISTORY — DX: Reserved for concepts with insufficient information to code with codable children: IMO0002

## 2015-08-30 DIAGNOSIS — Z4889 Encounter for other specified surgical aftercare: Secondary | ICD-10-CM

## 2015-08-30 HISTORY — DX: Encounter for other specified surgical aftercare: Z48.89

## 2015-10-14 DIAGNOSIS — I10 Essential (primary) hypertension: Secondary | ICD-10-CM

## 2015-10-14 DIAGNOSIS — R9431 Abnormal electrocardiogram [ECG] [EKG]: Secondary | ICD-10-CM

## 2015-10-14 HISTORY — DX: Abnormal electrocardiogram (ECG) (EKG): R94.31

## 2015-10-14 HISTORY — DX: Essential (primary) hypertension: I10

## 2016-04-07 DIAGNOSIS — R7302 Impaired glucose tolerance (oral): Secondary | ICD-10-CM | POA: Diagnosis not present

## 2016-04-07 DIAGNOSIS — E782 Mixed hyperlipidemia: Secondary | ICD-10-CM | POA: Diagnosis not present

## 2016-04-07 DIAGNOSIS — Z79899 Other long term (current) drug therapy: Secondary | ICD-10-CM | POA: Diagnosis not present

## 2016-04-07 DIAGNOSIS — R5383 Other fatigue: Secondary | ICD-10-CM | POA: Diagnosis not present

## 2016-04-08 DIAGNOSIS — K210 Gastro-esophageal reflux disease with esophagitis: Secondary | ICD-10-CM | POA: Diagnosis not present

## 2016-04-08 DIAGNOSIS — R1013 Epigastric pain: Secondary | ICD-10-CM | POA: Diagnosis not present

## 2016-04-09 DIAGNOSIS — K922 Gastrointestinal hemorrhage, unspecified: Secondary | ICD-10-CM | POA: Diagnosis not present

## 2016-04-21 DIAGNOSIS — C61 Malignant neoplasm of prostate: Secondary | ICD-10-CM | POA: Diagnosis not present

## 2016-05-01 DIAGNOSIS — N200 Calculus of kidney: Secondary | ICD-10-CM | POA: Diagnosis not present

## 2016-05-01 DIAGNOSIS — Z8546 Personal history of malignant neoplasm of prostate: Secondary | ICD-10-CM | POA: Diagnosis not present

## 2016-05-07 DIAGNOSIS — M5136 Other intervertebral disc degeneration, lumbar region: Secondary | ICD-10-CM | POA: Diagnosis not present

## 2016-05-07 DIAGNOSIS — M48061 Spinal stenosis, lumbar region without neurogenic claudication: Secondary | ICD-10-CM | POA: Diagnosis not present

## 2016-05-07 DIAGNOSIS — M47816 Spondylosis without myelopathy or radiculopathy, lumbar region: Secondary | ICD-10-CM | POA: Diagnosis not present

## 2016-05-07 DIAGNOSIS — M545 Low back pain: Secondary | ICD-10-CM | POA: Diagnosis not present

## 2016-05-15 DIAGNOSIS — R1310 Dysphagia, unspecified: Secondary | ICD-10-CM | POA: Diagnosis not present

## 2016-05-15 DIAGNOSIS — K210 Gastro-esophageal reflux disease with esophagitis: Secondary | ICD-10-CM | POA: Diagnosis not present

## 2016-05-15 DIAGNOSIS — R1013 Epigastric pain: Secondary | ICD-10-CM | POA: Diagnosis not present

## 2016-05-15 DIAGNOSIS — G4733 Obstructive sleep apnea (adult) (pediatric): Secondary | ICD-10-CM | POA: Diagnosis not present

## 2016-05-15 DIAGNOSIS — K259 Gastric ulcer, unspecified as acute or chronic, without hemorrhage or perforation: Secondary | ICD-10-CM | POA: Diagnosis not present

## 2016-05-15 DIAGNOSIS — K2960 Other gastritis without bleeding: Secondary | ICD-10-CM | POA: Diagnosis not present

## 2016-05-15 DIAGNOSIS — K317 Polyp of stomach and duodenum: Secondary | ICD-10-CM | POA: Diagnosis not present

## 2016-05-15 DIAGNOSIS — D131 Benign neoplasm of stomach: Secondary | ICD-10-CM | POA: Diagnosis not present

## 2016-05-15 DIAGNOSIS — K2900 Acute gastritis without bleeding: Secondary | ICD-10-CM | POA: Diagnosis not present

## 2016-05-15 DIAGNOSIS — K2970 Gastritis, unspecified, without bleeding: Secondary | ICD-10-CM | POA: Diagnosis not present

## 2016-05-19 DIAGNOSIS — G5603 Carpal tunnel syndrome, bilateral upper limbs: Secondary | ICD-10-CM | POA: Diagnosis not present

## 2016-05-19 DIAGNOSIS — M65342 Trigger finger, left ring finger: Secondary | ICD-10-CM | POA: Diagnosis not present

## 2016-05-19 DIAGNOSIS — N200 Calculus of kidney: Secondary | ICD-10-CM | POA: Diagnosis not present

## 2016-05-19 DIAGNOSIS — R310 Gross hematuria: Secondary | ICD-10-CM | POA: Diagnosis not present

## 2016-05-21 DIAGNOSIS — R310 Gross hematuria: Secondary | ICD-10-CM | POA: Diagnosis not present

## 2016-05-22 DIAGNOSIS — G5612 Other lesions of median nerve, left upper limb: Secondary | ICD-10-CM | POA: Diagnosis not present

## 2016-05-22 DIAGNOSIS — G5611 Other lesions of median nerve, right upper limb: Secondary | ICD-10-CM | POA: Diagnosis not present

## 2016-05-22 DIAGNOSIS — G5603 Carpal tunnel syndrome, bilateral upper limbs: Secondary | ICD-10-CM | POA: Diagnosis not present

## 2016-06-02 HISTORY — PX: CARPAL TUNNEL RELEASE: SHX101

## 2016-06-02 HISTORY — PX: REPLACEMENT TOTAL KNEE: SUR1224

## 2016-06-10 DIAGNOSIS — J209 Acute bronchitis, unspecified: Secondary | ICD-10-CM | POA: Diagnosis not present

## 2016-06-10 DIAGNOSIS — J0100 Acute maxillary sinusitis, unspecified: Secondary | ICD-10-CM | POA: Diagnosis not present

## 2016-06-23 DIAGNOSIS — G5603 Carpal tunnel syndrome, bilateral upper limbs: Secondary | ICD-10-CM | POA: Diagnosis not present

## 2016-06-23 DIAGNOSIS — M65342 Trigger finger, left ring finger: Secondary | ICD-10-CM | POA: Diagnosis not present

## 2016-06-27 DIAGNOSIS — Z87891 Personal history of nicotine dependence: Secondary | ICD-10-CM | POA: Diagnosis not present

## 2016-06-27 DIAGNOSIS — Z7984 Long term (current) use of oral hypoglycemic drugs: Secondary | ICD-10-CM | POA: Diagnosis not present

## 2016-06-27 DIAGNOSIS — Z888 Allergy status to other drugs, medicaments and biological substances status: Secondary | ICD-10-CM | POA: Diagnosis not present

## 2016-06-27 DIAGNOSIS — Z79899 Other long term (current) drug therapy: Secondary | ICD-10-CM | POA: Diagnosis not present

## 2016-06-27 DIAGNOSIS — Z6841 Body Mass Index (BMI) 40.0 and over, adult: Secondary | ICD-10-CM | POA: Diagnosis not present

## 2016-06-27 DIAGNOSIS — E7800 Pure hypercholesterolemia, unspecified: Secondary | ICD-10-CM | POA: Diagnosis not present

## 2016-06-27 DIAGNOSIS — E119 Type 2 diabetes mellitus without complications: Secondary | ICD-10-CM | POA: Diagnosis not present

## 2016-06-27 DIAGNOSIS — G5602 Carpal tunnel syndrome, left upper limb: Secondary | ICD-10-CM | POA: Diagnosis not present

## 2016-06-27 DIAGNOSIS — G4730 Sleep apnea, unspecified: Secondary | ICD-10-CM | POA: Diagnosis not present

## 2016-06-27 DIAGNOSIS — K219 Gastro-esophageal reflux disease without esophagitis: Secondary | ICD-10-CM | POA: Diagnosis not present

## 2016-06-27 DIAGNOSIS — M65342 Trigger finger, left ring finger: Secondary | ICD-10-CM | POA: Diagnosis not present

## 2016-06-27 DIAGNOSIS — I10 Essential (primary) hypertension: Secondary | ICD-10-CM | POA: Diagnosis not present

## 2016-06-27 DIAGNOSIS — G5603 Carpal tunnel syndrome, bilateral upper limbs: Secondary | ICD-10-CM | POA: Diagnosis not present

## 2016-07-01 DIAGNOSIS — R7302 Impaired glucose tolerance (oral): Secondary | ICD-10-CM | POA: Diagnosis not present

## 2016-07-01 DIAGNOSIS — G4700 Insomnia, unspecified: Secondary | ICD-10-CM | POA: Diagnosis not present

## 2016-07-01 DIAGNOSIS — Z9181 History of falling: Secondary | ICD-10-CM | POA: Diagnosis not present

## 2016-07-01 DIAGNOSIS — Z0000 Encounter for general adult medical examination without abnormal findings: Secondary | ICD-10-CM | POA: Diagnosis not present

## 2016-07-01 DIAGNOSIS — K219 Gastro-esophageal reflux disease without esophagitis: Secondary | ICD-10-CM | POA: Diagnosis not present

## 2016-08-13 DIAGNOSIS — M1712 Unilateral primary osteoarthritis, left knee: Secondary | ICD-10-CM | POA: Diagnosis not present

## 2016-08-18 DIAGNOSIS — Z79899 Other long term (current) drug therapy: Secondary | ICD-10-CM | POA: Diagnosis not present

## 2016-08-18 DIAGNOSIS — Z01818 Encounter for other preprocedural examination: Secondary | ICD-10-CM | POA: Diagnosis not present

## 2016-08-18 DIAGNOSIS — E559 Vitamin D deficiency, unspecified: Secondary | ICD-10-CM | POA: Diagnosis not present

## 2016-08-18 DIAGNOSIS — M79609 Pain in unspecified limb: Secondary | ICD-10-CM | POA: Diagnosis not present

## 2016-08-18 DIAGNOSIS — R52 Pain, unspecified: Secondary | ICD-10-CM | POA: Diagnosis not present

## 2016-08-19 DIAGNOSIS — Z01810 Encounter for preprocedural cardiovascular examination: Secondary | ICD-10-CM

## 2016-08-19 DIAGNOSIS — I7789 Other specified disorders of arteries and arterioles: Secondary | ICD-10-CM

## 2016-08-19 HISTORY — DX: Other specified disorders of arteries and arterioles: I77.89

## 2016-08-19 HISTORY — DX: Encounter for preprocedural cardiovascular examination: Z01.810

## 2016-08-20 DIAGNOSIS — M1990 Unspecified osteoarthritis, unspecified site: Secondary | ICD-10-CM | POA: Diagnosis not present

## 2016-08-20 DIAGNOSIS — I10 Essential (primary) hypertension: Secondary | ICD-10-CM | POA: Diagnosis not present

## 2016-08-20 DIAGNOSIS — Z6841 Body Mass Index (BMI) 40.0 and over, adult: Secondary | ICD-10-CM | POA: Diagnosis not present

## 2016-08-27 DIAGNOSIS — Z01810 Encounter for preprocedural cardiovascular examination: Secondary | ICD-10-CM | POA: Diagnosis not present

## 2016-08-27 DIAGNOSIS — I10 Essential (primary) hypertension: Secondary | ICD-10-CM | POA: Diagnosis not present

## 2016-08-27 DIAGNOSIS — I7789 Other specified disorders of arteries and arterioles: Secondary | ICD-10-CM | POA: Diagnosis not present

## 2016-09-17 DIAGNOSIS — M1712 Unilateral primary osteoarthritis, left knee: Secondary | ICD-10-CM | POA: Diagnosis not present

## 2016-09-23 DIAGNOSIS — I10 Essential (primary) hypertension: Secondary | ICD-10-CM | POA: Diagnosis not present

## 2016-09-23 DIAGNOSIS — E785 Hyperlipidemia, unspecified: Secondary | ICD-10-CM | POA: Diagnosis not present

## 2016-09-23 DIAGNOSIS — Z8546 Personal history of malignant neoplasm of prostate: Secondary | ICD-10-CM | POA: Diagnosis not present

## 2016-09-23 DIAGNOSIS — G4730 Sleep apnea, unspecified: Secondary | ICD-10-CM | POA: Diagnosis not present

## 2016-09-23 DIAGNOSIS — M1712 Unilateral primary osteoarthritis, left knee: Secondary | ICD-10-CM | POA: Diagnosis not present

## 2016-09-23 DIAGNOSIS — G4733 Obstructive sleep apnea (adult) (pediatric): Secondary | ICD-10-CM | POA: Diagnosis not present

## 2016-09-23 DIAGNOSIS — Z87891 Personal history of nicotine dependence: Secondary | ICD-10-CM | POA: Diagnosis not present

## 2016-09-23 DIAGNOSIS — E119 Type 2 diabetes mellitus without complications: Secondary | ICD-10-CM | POA: Diagnosis not present

## 2016-09-23 DIAGNOSIS — K219 Gastro-esophageal reflux disease without esophagitis: Secondary | ICD-10-CM | POA: Diagnosis not present

## 2016-09-23 DIAGNOSIS — Z6841 Body Mass Index (BMI) 40.0 and over, adult: Secondary | ICD-10-CM | POA: Diagnosis not present

## 2016-09-23 DIAGNOSIS — Z96652 Presence of left artificial knee joint: Secondary | ICD-10-CM | POA: Diagnosis not present

## 2016-09-23 DIAGNOSIS — J219 Acute bronchiolitis, unspecified: Secondary | ICD-10-CM | POA: Diagnosis present

## 2016-09-23 DIAGNOSIS — Z9981 Dependence on supplemental oxygen: Secondary | ICD-10-CM | POA: Diagnosis not present

## 2016-09-26 DIAGNOSIS — Z6841 Body Mass Index (BMI) 40.0 and over, adult: Secondary | ICD-10-CM | POA: Diagnosis not present

## 2016-09-26 DIAGNOSIS — M5416 Radiculopathy, lumbar region: Secondary | ICD-10-CM | POA: Diagnosis not present

## 2016-09-26 DIAGNOSIS — Z79891 Long term (current) use of opiate analgesic: Secondary | ICD-10-CM | POA: Diagnosis not present

## 2016-09-26 DIAGNOSIS — Z7901 Long term (current) use of anticoagulants: Secondary | ICD-10-CM | POA: Diagnosis not present

## 2016-09-26 DIAGNOSIS — Z9181 History of falling: Secondary | ICD-10-CM | POA: Diagnosis not present

## 2016-09-26 DIAGNOSIS — Z471 Aftercare following joint replacement surgery: Secondary | ICD-10-CM | POA: Diagnosis not present

## 2016-09-26 DIAGNOSIS — Z96652 Presence of left artificial knee joint: Secondary | ICD-10-CM | POA: Diagnosis not present

## 2016-09-26 DIAGNOSIS — E119 Type 2 diabetes mellitus without complications: Secondary | ICD-10-CM | POA: Diagnosis not present

## 2016-09-26 DIAGNOSIS — I10 Essential (primary) hypertension: Secondary | ICD-10-CM | POA: Diagnosis not present

## 2016-09-28 DIAGNOSIS — M25562 Pain in left knee: Secondary | ICD-10-CM | POA: Diagnosis not present

## 2016-09-28 DIAGNOSIS — M25462 Effusion, left knee: Secondary | ICD-10-CM | POA: Diagnosis not present

## 2016-09-28 DIAGNOSIS — Z79891 Long term (current) use of opiate analgesic: Secondary | ICD-10-CM | POA: Diagnosis not present

## 2016-09-28 DIAGNOSIS — I10 Essential (primary) hypertension: Secondary | ICD-10-CM | POA: Diagnosis not present

## 2016-09-28 DIAGNOSIS — M7989 Other specified soft tissue disorders: Secondary | ICD-10-CM | POA: Diagnosis not present

## 2016-09-28 DIAGNOSIS — R600 Localized edema: Secondary | ICD-10-CM | POA: Diagnosis not present

## 2016-09-28 DIAGNOSIS — Z87891 Personal history of nicotine dependence: Secondary | ICD-10-CM | POA: Diagnosis not present

## 2016-09-28 DIAGNOSIS — M79662 Pain in left lower leg: Secondary | ICD-10-CM | POA: Diagnosis not present

## 2016-09-29 DIAGNOSIS — Z9889 Other specified postprocedural states: Secondary | ICD-10-CM | POA: Diagnosis not present

## 2016-09-30 DIAGNOSIS — Z96652 Presence of left artificial knee joint: Secondary | ICD-10-CM | POA: Diagnosis not present

## 2016-09-30 DIAGNOSIS — Z471 Aftercare following joint replacement surgery: Secondary | ICD-10-CM | POA: Diagnosis not present

## 2016-09-30 DIAGNOSIS — M5416 Radiculopathy, lumbar region: Secondary | ICD-10-CM | POA: Diagnosis not present

## 2016-09-30 DIAGNOSIS — I10 Essential (primary) hypertension: Secondary | ICD-10-CM | POA: Diagnosis not present

## 2016-09-30 DIAGNOSIS — E119 Type 2 diabetes mellitus without complications: Secondary | ICD-10-CM | POA: Diagnosis not present

## 2016-10-01 DIAGNOSIS — Z471 Aftercare following joint replacement surgery: Secondary | ICD-10-CM | POA: Diagnosis not present

## 2016-10-01 DIAGNOSIS — M5416 Radiculopathy, lumbar region: Secondary | ICD-10-CM | POA: Diagnosis not present

## 2016-10-01 DIAGNOSIS — E119 Type 2 diabetes mellitus without complications: Secondary | ICD-10-CM | POA: Diagnosis not present

## 2016-10-01 DIAGNOSIS — Z96652 Presence of left artificial knee joint: Secondary | ICD-10-CM | POA: Diagnosis not present

## 2016-10-01 DIAGNOSIS — I10 Essential (primary) hypertension: Secondary | ICD-10-CM | POA: Diagnosis not present

## 2016-10-02 DIAGNOSIS — Z96652 Presence of left artificial knee joint: Secondary | ICD-10-CM | POA: Diagnosis not present

## 2016-10-02 DIAGNOSIS — M5416 Radiculopathy, lumbar region: Secondary | ICD-10-CM | POA: Diagnosis not present

## 2016-10-02 DIAGNOSIS — Z471 Aftercare following joint replacement surgery: Secondary | ICD-10-CM | POA: Diagnosis not present

## 2016-10-02 DIAGNOSIS — I10 Essential (primary) hypertension: Secondary | ICD-10-CM | POA: Diagnosis not present

## 2016-10-02 DIAGNOSIS — E119 Type 2 diabetes mellitus without complications: Secondary | ICD-10-CM | POA: Diagnosis not present

## 2016-10-07 DIAGNOSIS — Z471 Aftercare following joint replacement surgery: Secondary | ICD-10-CM | POA: Diagnosis not present

## 2016-10-07 DIAGNOSIS — Z96652 Presence of left artificial knee joint: Secondary | ICD-10-CM | POA: Diagnosis not present

## 2016-10-07 DIAGNOSIS — M6281 Muscle weakness (generalized): Secondary | ICD-10-CM | POA: Diagnosis not present

## 2016-10-07 DIAGNOSIS — R2689 Other abnormalities of gait and mobility: Secondary | ICD-10-CM | POA: Diagnosis not present

## 2016-10-09 DIAGNOSIS — Z96652 Presence of left artificial knee joint: Secondary | ICD-10-CM | POA: Diagnosis not present

## 2016-10-09 DIAGNOSIS — M6281 Muscle weakness (generalized): Secondary | ICD-10-CM | POA: Diagnosis not present

## 2016-10-09 DIAGNOSIS — Z471 Aftercare following joint replacement surgery: Secondary | ICD-10-CM | POA: Diagnosis not present

## 2016-10-09 DIAGNOSIS — R2689 Other abnormalities of gait and mobility: Secondary | ICD-10-CM | POA: Diagnosis not present

## 2016-10-14 DIAGNOSIS — Z96652 Presence of left artificial knee joint: Secondary | ICD-10-CM | POA: Diagnosis not present

## 2016-10-14 DIAGNOSIS — Z471 Aftercare following joint replacement surgery: Secondary | ICD-10-CM | POA: Diagnosis not present

## 2016-10-14 DIAGNOSIS — M6281 Muscle weakness (generalized): Secondary | ICD-10-CM | POA: Diagnosis not present

## 2016-10-14 DIAGNOSIS — R2689 Other abnormalities of gait and mobility: Secondary | ICD-10-CM | POA: Diagnosis not present

## 2016-10-15 DIAGNOSIS — I7789 Other specified disorders of arteries and arterioles: Secondary | ICD-10-CM | POA: Diagnosis not present

## 2016-10-15 DIAGNOSIS — I712 Thoracic aortic aneurysm, without rupture: Secondary | ICD-10-CM | POA: Diagnosis not present

## 2016-10-15 DIAGNOSIS — I10 Essential (primary) hypertension: Secondary | ICD-10-CM | POA: Diagnosis not present

## 2016-10-16 DIAGNOSIS — Z96652 Presence of left artificial knee joint: Secondary | ICD-10-CM | POA: Diagnosis not present

## 2016-10-16 DIAGNOSIS — M6281 Muscle weakness (generalized): Secondary | ICD-10-CM | POA: Diagnosis not present

## 2016-10-16 DIAGNOSIS — R2689 Other abnormalities of gait and mobility: Secondary | ICD-10-CM | POA: Diagnosis not present

## 2016-10-16 DIAGNOSIS — Z471 Aftercare following joint replacement surgery: Secondary | ICD-10-CM | POA: Diagnosis not present

## 2016-10-17 DIAGNOSIS — I712 Thoracic aortic aneurysm, without rupture: Secondary | ICD-10-CM | POA: Diagnosis not present

## 2016-10-17 DIAGNOSIS — N200 Calculus of kidney: Secondary | ICD-10-CM | POA: Diagnosis not present

## 2016-10-17 DIAGNOSIS — I2510 Atherosclerotic heart disease of native coronary artery without angina pectoris: Secondary | ICD-10-CM | POA: Diagnosis not present

## 2016-10-20 DIAGNOSIS — E1129 Type 2 diabetes mellitus with other diabetic kidney complication: Secondary | ICD-10-CM | POA: Diagnosis not present

## 2016-10-21 DIAGNOSIS — Z96652 Presence of left artificial knee joint: Secondary | ICD-10-CM | POA: Diagnosis not present

## 2016-10-21 DIAGNOSIS — Z471 Aftercare following joint replacement surgery: Secondary | ICD-10-CM | POA: Diagnosis not present

## 2016-10-21 DIAGNOSIS — R2689 Other abnormalities of gait and mobility: Secondary | ICD-10-CM | POA: Diagnosis not present

## 2016-10-21 DIAGNOSIS — M6281 Muscle weakness (generalized): Secondary | ICD-10-CM | POA: Diagnosis not present

## 2016-10-23 DIAGNOSIS — Z471 Aftercare following joint replacement surgery: Secondary | ICD-10-CM | POA: Diagnosis not present

## 2016-10-23 DIAGNOSIS — R2689 Other abnormalities of gait and mobility: Secondary | ICD-10-CM | POA: Diagnosis not present

## 2016-10-23 DIAGNOSIS — M6281 Muscle weakness (generalized): Secondary | ICD-10-CM | POA: Diagnosis not present

## 2016-10-23 DIAGNOSIS — Z96652 Presence of left artificial knee joint: Secondary | ICD-10-CM | POA: Diagnosis not present

## 2016-10-28 DIAGNOSIS — R7302 Impaired glucose tolerance (oral): Secondary | ICD-10-CM | POA: Diagnosis not present

## 2016-10-28 DIAGNOSIS — Z6841 Body Mass Index (BMI) 40.0 and over, adult: Secondary | ICD-10-CM | POA: Diagnosis not present

## 2016-10-29 DIAGNOSIS — M6281 Muscle weakness (generalized): Secondary | ICD-10-CM | POA: Diagnosis not present

## 2016-10-29 DIAGNOSIS — Z96652 Presence of left artificial knee joint: Secondary | ICD-10-CM | POA: Diagnosis not present

## 2016-10-29 DIAGNOSIS — Z471 Aftercare following joint replacement surgery: Secondary | ICD-10-CM | POA: Diagnosis not present

## 2016-10-29 DIAGNOSIS — R2689 Other abnormalities of gait and mobility: Secondary | ICD-10-CM | POA: Diagnosis not present

## 2016-10-31 DIAGNOSIS — R2689 Other abnormalities of gait and mobility: Secondary | ICD-10-CM | POA: Diagnosis not present

## 2016-10-31 DIAGNOSIS — Z96652 Presence of left artificial knee joint: Secondary | ICD-10-CM | POA: Diagnosis not present

## 2016-10-31 DIAGNOSIS — M6281 Muscle weakness (generalized): Secondary | ICD-10-CM | POA: Diagnosis not present

## 2016-10-31 DIAGNOSIS — Z471 Aftercare following joint replacement surgery: Secondary | ICD-10-CM | POA: Diagnosis not present

## 2016-11-03 DIAGNOSIS — Z96652 Presence of left artificial knee joint: Secondary | ICD-10-CM | POA: Diagnosis not present

## 2016-11-03 DIAGNOSIS — R2689 Other abnormalities of gait and mobility: Secondary | ICD-10-CM | POA: Diagnosis not present

## 2016-11-03 DIAGNOSIS — M25562 Pain in left knee: Secondary | ICD-10-CM | POA: Diagnosis not present

## 2016-11-03 DIAGNOSIS — Z471 Aftercare following joint replacement surgery: Secondary | ICD-10-CM | POA: Diagnosis not present

## 2016-11-03 DIAGNOSIS — M6281 Muscle weakness (generalized): Secondary | ICD-10-CM | POA: Diagnosis not present

## 2016-11-06 DIAGNOSIS — M48061 Spinal stenosis, lumbar region without neurogenic claudication: Secondary | ICD-10-CM | POA: Diagnosis not present

## 2016-11-06 DIAGNOSIS — M545 Low back pain: Secondary | ICD-10-CM | POA: Diagnosis not present

## 2016-11-06 DIAGNOSIS — M5136 Other intervertebral disc degeneration, lumbar region: Secondary | ICD-10-CM | POA: Diagnosis not present

## 2016-11-06 DIAGNOSIS — M47816 Spondylosis without myelopathy or radiculopathy, lumbar region: Secondary | ICD-10-CM | POA: Diagnosis not present

## 2016-11-07 DIAGNOSIS — Z96652 Presence of left artificial knee joint: Secondary | ICD-10-CM | POA: Diagnosis not present

## 2016-11-07 DIAGNOSIS — M6281 Muscle weakness (generalized): Secondary | ICD-10-CM | POA: Diagnosis not present

## 2016-11-07 DIAGNOSIS — R2689 Other abnormalities of gait and mobility: Secondary | ICD-10-CM | POA: Diagnosis not present

## 2016-11-07 DIAGNOSIS — Z471 Aftercare following joint replacement surgery: Secondary | ICD-10-CM | POA: Diagnosis not present

## 2016-11-10 DIAGNOSIS — R2689 Other abnormalities of gait and mobility: Secondary | ICD-10-CM | POA: Diagnosis not present

## 2016-11-10 DIAGNOSIS — Z471 Aftercare following joint replacement surgery: Secondary | ICD-10-CM | POA: Diagnosis not present

## 2016-11-10 DIAGNOSIS — M6281 Muscle weakness (generalized): Secondary | ICD-10-CM | POA: Diagnosis not present

## 2016-11-10 DIAGNOSIS — Z96652 Presence of left artificial knee joint: Secondary | ICD-10-CM | POA: Diagnosis not present

## 2016-11-13 DIAGNOSIS — M6281 Muscle weakness (generalized): Secondary | ICD-10-CM | POA: Diagnosis not present

## 2016-11-13 DIAGNOSIS — Z471 Aftercare following joint replacement surgery: Secondary | ICD-10-CM | POA: Diagnosis not present

## 2016-11-13 DIAGNOSIS — R2689 Other abnormalities of gait and mobility: Secondary | ICD-10-CM | POA: Diagnosis not present

## 2016-11-13 DIAGNOSIS — Z96652 Presence of left artificial knee joint: Secondary | ICD-10-CM | POA: Diagnosis not present

## 2016-11-17 DIAGNOSIS — M6281 Muscle weakness (generalized): Secondary | ICD-10-CM | POA: Diagnosis not present

## 2016-11-17 DIAGNOSIS — R2689 Other abnormalities of gait and mobility: Secondary | ICD-10-CM | POA: Diagnosis not present

## 2016-11-17 DIAGNOSIS — Z471 Aftercare following joint replacement surgery: Secondary | ICD-10-CM | POA: Diagnosis not present

## 2016-11-17 DIAGNOSIS — Z96652 Presence of left artificial knee joint: Secondary | ICD-10-CM | POA: Diagnosis not present

## 2016-11-20 DIAGNOSIS — Z471 Aftercare following joint replacement surgery: Secondary | ICD-10-CM | POA: Diagnosis not present

## 2016-11-20 DIAGNOSIS — R2689 Other abnormalities of gait and mobility: Secondary | ICD-10-CM | POA: Diagnosis not present

## 2016-11-20 DIAGNOSIS — Z96652 Presence of left artificial knee joint: Secondary | ICD-10-CM | POA: Diagnosis not present

## 2016-11-20 DIAGNOSIS — M6281 Muscle weakness (generalized): Secondary | ICD-10-CM | POA: Diagnosis not present

## 2016-11-24 DIAGNOSIS — R2689 Other abnormalities of gait and mobility: Secondary | ICD-10-CM | POA: Diagnosis not present

## 2016-11-24 DIAGNOSIS — Z471 Aftercare following joint replacement surgery: Secondary | ICD-10-CM | POA: Diagnosis not present

## 2016-11-24 DIAGNOSIS — M6281 Muscle weakness (generalized): Secondary | ICD-10-CM | POA: Diagnosis not present

## 2016-11-24 DIAGNOSIS — Z96652 Presence of left artificial knee joint: Secondary | ICD-10-CM | POA: Diagnosis not present

## 2016-11-27 DIAGNOSIS — R2689 Other abnormalities of gait and mobility: Secondary | ICD-10-CM | POA: Diagnosis not present

## 2016-11-27 DIAGNOSIS — M6281 Muscle weakness (generalized): Secondary | ICD-10-CM | POA: Diagnosis not present

## 2016-11-27 DIAGNOSIS — Z96652 Presence of left artificial knee joint: Secondary | ICD-10-CM | POA: Diagnosis not present

## 2016-11-27 DIAGNOSIS — Z471 Aftercare following joint replacement surgery: Secondary | ICD-10-CM | POA: Diagnosis not present

## 2016-12-02 DIAGNOSIS — Z96652 Presence of left artificial knee joint: Secondary | ICD-10-CM | POA: Diagnosis not present

## 2016-12-02 DIAGNOSIS — M6281 Muscle weakness (generalized): Secondary | ICD-10-CM | POA: Diagnosis not present

## 2016-12-02 DIAGNOSIS — M7989 Other specified soft tissue disorders: Secondary | ICD-10-CM | POA: Diagnosis not present

## 2016-12-02 DIAGNOSIS — Z471 Aftercare following joint replacement surgery: Secondary | ICD-10-CM | POA: Diagnosis not present

## 2016-12-02 DIAGNOSIS — M79609 Pain in unspecified limb: Secondary | ICD-10-CM | POA: Diagnosis not present

## 2016-12-02 DIAGNOSIS — R2689 Other abnormalities of gait and mobility: Secondary | ICD-10-CM | POA: Diagnosis not present

## 2016-12-02 DIAGNOSIS — R600 Localized edema: Secondary | ICD-10-CM | POA: Diagnosis not present

## 2016-12-02 DIAGNOSIS — E1129 Type 2 diabetes mellitus with other diabetic kidney complication: Secondary | ICD-10-CM | POA: Diagnosis not present

## 2016-12-08 DIAGNOSIS — Z471 Aftercare following joint replacement surgery: Secondary | ICD-10-CM | POA: Diagnosis not present

## 2016-12-08 DIAGNOSIS — M6281 Muscle weakness (generalized): Secondary | ICD-10-CM | POA: Diagnosis not present

## 2016-12-08 DIAGNOSIS — R2689 Other abnormalities of gait and mobility: Secondary | ICD-10-CM | POA: Diagnosis not present

## 2016-12-08 DIAGNOSIS — Z96652 Presence of left artificial knee joint: Secondary | ICD-10-CM | POA: Diagnosis not present

## 2016-12-11 DIAGNOSIS — Z96652 Presence of left artificial knee joint: Secondary | ICD-10-CM | POA: Diagnosis not present

## 2016-12-11 DIAGNOSIS — Z471 Aftercare following joint replacement surgery: Secondary | ICD-10-CM | POA: Diagnosis not present

## 2016-12-11 DIAGNOSIS — R2689 Other abnormalities of gait and mobility: Secondary | ICD-10-CM | POA: Diagnosis not present

## 2016-12-11 DIAGNOSIS — M6281 Muscle weakness (generalized): Secondary | ICD-10-CM | POA: Diagnosis not present

## 2016-12-15 DIAGNOSIS — M65332 Trigger finger, left middle finger: Secondary | ICD-10-CM | POA: Diagnosis not present

## 2016-12-18 DIAGNOSIS — M791 Myalgia: Secondary | ICD-10-CM | POA: Diagnosis not present

## 2016-12-18 DIAGNOSIS — M545 Low back pain: Secondary | ICD-10-CM | POA: Diagnosis not present

## 2017-01-19 DIAGNOSIS — J0100 Acute maxillary sinusitis, unspecified: Secondary | ICD-10-CM | POA: Diagnosis not present

## 2017-01-26 DIAGNOSIS — Z96652 Presence of left artificial knee joint: Secondary | ICD-10-CM | POA: Diagnosis not present

## 2017-02-26 DIAGNOSIS — G5601 Carpal tunnel syndrome, right upper limb: Secondary | ICD-10-CM | POA: Diagnosis not present

## 2017-02-26 DIAGNOSIS — M65332 Trigger finger, left middle finger: Secondary | ICD-10-CM | POA: Diagnosis not present

## 2017-02-27 DIAGNOSIS — E119 Type 2 diabetes mellitus without complications: Secondary | ICD-10-CM | POA: Diagnosis not present

## 2017-03-02 DIAGNOSIS — R7302 Impaired glucose tolerance (oral): Secondary | ICD-10-CM | POA: Diagnosis not present

## 2017-03-02 DIAGNOSIS — E119 Type 2 diabetes mellitus without complications: Secondary | ICD-10-CM | POA: Diagnosis not present

## 2017-03-02 DIAGNOSIS — Z8349 Family history of other endocrine, nutritional and metabolic diseases: Secondary | ICD-10-CM | POA: Diagnosis not present

## 2017-03-02 DIAGNOSIS — I10 Essential (primary) hypertension: Secondary | ICD-10-CM | POA: Diagnosis not present

## 2017-03-02 DIAGNOSIS — Z6841 Body Mass Index (BMI) 40.0 and over, adult: Secondary | ICD-10-CM | POA: Diagnosis not present

## 2017-03-02 DIAGNOSIS — Z23 Encounter for immunization: Secondary | ICD-10-CM | POA: Diagnosis not present

## 2017-03-02 HISTORY — PX: CARPAL TUNNEL RELEASE: SHX101

## 2017-03-16 DIAGNOSIS — E119 Type 2 diabetes mellitus without complications: Secondary | ICD-10-CM

## 2017-03-16 DIAGNOSIS — M5416 Radiculopathy, lumbar region: Secondary | ICD-10-CM

## 2017-03-16 DIAGNOSIS — K219 Gastro-esophageal reflux disease without esophagitis: Secondary | ICD-10-CM | POA: Insufficient documentation

## 2017-03-16 DIAGNOSIS — M1990 Unspecified osteoarthritis, unspecified site: Secondary | ICD-10-CM | POA: Insufficient documentation

## 2017-03-16 DIAGNOSIS — E785 Hyperlipidemia, unspecified: Secondary | ICD-10-CM | POA: Insufficient documentation

## 2017-03-16 DIAGNOSIS — N200 Calculus of kidney: Secondary | ICD-10-CM

## 2017-03-16 DIAGNOSIS — C61 Malignant neoplasm of prostate: Secondary | ICD-10-CM

## 2017-03-16 HISTORY — DX: Calculus of kidney: N20.0

## 2017-03-16 HISTORY — DX: Malignant neoplasm of prostate: C61

## 2017-03-16 HISTORY — DX: Gastro-esophageal reflux disease without esophagitis: K21.9

## 2017-03-16 HISTORY — DX: Radiculopathy, lumbar region: M54.16

## 2017-03-16 HISTORY — DX: Type 2 diabetes mellitus without complications: E11.9

## 2017-03-24 ENCOUNTER — Other Ambulatory Visit: Payer: Self-pay | Admitting: *Deleted

## 2017-03-27 DIAGNOSIS — G5601 Carpal tunnel syndrome, right upper limb: Secondary | ICD-10-CM | POA: Diagnosis not present

## 2017-03-27 DIAGNOSIS — Z87891 Personal history of nicotine dependence: Secondary | ICD-10-CM | POA: Diagnosis not present

## 2017-03-27 DIAGNOSIS — Z79899 Other long term (current) drug therapy: Secondary | ICD-10-CM | POA: Diagnosis not present

## 2017-03-27 DIAGNOSIS — E7800 Pure hypercholesterolemia, unspecified: Secondary | ICD-10-CM | POA: Diagnosis not present

## 2017-03-27 DIAGNOSIS — I10 Essential (primary) hypertension: Secondary | ICD-10-CM | POA: Diagnosis not present

## 2017-03-27 DIAGNOSIS — E119 Type 2 diabetes mellitus without complications: Secondary | ICD-10-CM | POA: Diagnosis not present

## 2017-03-27 DIAGNOSIS — M65332 Trigger finger, left middle finger: Secondary | ICD-10-CM | POA: Diagnosis not present

## 2017-03-27 DIAGNOSIS — E559 Vitamin D deficiency, unspecified: Secondary | ICD-10-CM | POA: Diagnosis not present

## 2017-03-27 DIAGNOSIS — E669 Obesity, unspecified: Secondary | ICD-10-CM | POA: Diagnosis not present

## 2017-04-07 DIAGNOSIS — D0439 Carcinoma in situ of skin of other parts of face: Secondary | ICD-10-CM | POA: Diagnosis not present

## 2017-04-07 DIAGNOSIS — D1801 Hemangioma of skin and subcutaneous tissue: Secondary | ICD-10-CM | POA: Diagnosis not present

## 2017-04-07 DIAGNOSIS — L821 Other seborrheic keratosis: Secondary | ICD-10-CM | POA: Diagnosis not present

## 2017-04-07 DIAGNOSIS — L570 Actinic keratosis: Secondary | ICD-10-CM | POA: Diagnosis not present

## 2017-04-07 DIAGNOSIS — L739 Follicular disorder, unspecified: Secondary | ICD-10-CM | POA: Diagnosis not present

## 2017-04-09 NOTE — Progress Notes (Signed)
Cardiology Office Note:    Date:  04/10/2017   ID:  Benedetto Goad, DOB [birth year], MRN [id number]  PCP:  Angelina Sheriff, MD  Cardiologist:  Shirlee More, MD    Referring MD: Angelina Sheriff, MD    ASSESSMENT:    1. 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 Essential hypertension   3. Hyperlipidemia, unspecified hyperlipidemia type   4. Coronary artery calcification seen on CT scan    PLAN:    In order of problems listed above:  1. Stable over two-year period of time by CT scan, continue antihypertensive lipid-lowering therapy and I will see back in the office in 1 year and recheck a CT scan in 18-24 months. 2. Stable blood pressure target continue ACE inhibitor 3. Stable continue statin 4. He is asymptomatic having no ischemic symptoms he is on good medical therapy and hypertensive lipid-lowering and at this time I would not advise an ischemia evaluation.   Next appointment: One year   Medication Adjustments/Labs and Tests Ordered: Current medicines are reviewed at length with the patient today.  Concerns regarding medicines are outlined above.  No orders of the defined types were placed in this encounter.  No orders of the defined types were placed in this encounter.   Chief Complaint  Patient presents with  . Follow-up    History of Present Illness:    Joseph Pham is a 66 y.o. 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 Compliance with diet, lifestyle and medications: Yes He has had no chest pain dyspnea palpitations syncope or TIA. Past Medical History:  Diagnosis Date  . Abnormal EKG 10/14/2015   Overview:  Poor R wave progression and abnormal T waves  . Acid reflux 03/16/2017  . Adenocarcinoma of prostate ([place]) 07/10/2011  . Aftercare following surgery 08/30/2015  . Arthritis HANDS AND LEFT KNEE  . 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 Diabetes ([place]) 03/16/2017  . Essential hypertension 10/14/2015  . GERD (gastroesophageal reflux disease)   . Hematuria   . Hemorrhoid   . History of kidney stones   . Hyperlipidemia   . Hypertension   . Kidney stone 03/16/2017  . Lumbar radiculopathy 03/16/2017  . OSA on CPAP   . Paronychia 08/16/2015  . Preoperative cardiovascular examination 08/19/2016  . Prostate cancer ([place]) 03/16/2017  . Right flank pain   . Right ureteral stone   . Skin cancer   . Type 2 diabetes, diet controlled ([name])   . Ureteral calculus, right 03/15/2012  . Wears glasses     Past Surgical History:  Procedure Laterality Date  . BILATERAL INGUINAL HERNIA REPAIR  1989  . 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  Marland Kitchen CARPAL TUNNEL RELEASE Left 06/2016  . CARPAL TUNNEL RELEASE Right 03/2017  . EXTRACORPOREAL SHOCK WAVE LITHOTRIPSY  02-08-2009  . LEFT KNEE [place]  NOV 2011  . LEFT URETEROSCOPIC STONE EXTRACTION  02-23-2009  . SKIN CANCER EXCISION     Burned off left side of nose, and head    Current Medications: Current Meds  Medication Sig  . atorvastatin (LIPITOR) 10 MG tablet Take 10 mg by mouth daily.  . ergocalciferol (VITAMIN D2) 50000 units capsule  Take 50,000 Units once a week by mouth.  . fexofenadine (ALLEGRA) 180 MG tablet Take 180 mg by mouth daily.  . fluticasone (FLONASE) 50 MCG/ACT nasal spray Place 2 sprays into the nose daily.  Marland Kitchen lisinopril (PRINIVIL,ZESTRIL) 10 MG tablet Take 10 mg by mouth every morning.   . Melatonin 5 MG CAPS Take 1 capsule by mouth at bedtime.   . Misc Natural Products (OSTEO BI-FLEX ADV DOUBLE ST) TABS Take 1 tablet by mouth daily.   . Multiple Vitamin (MULTI-VITAMINS) TABS Take by mouth.  . Omega 3 1200 MG CAPS Take 4 capsules by mouth daily.  Marland Kitchen omeprazole (PRILOSEC) 10 MG capsule Take 10 mg daily by mouth.   . pregabalin (LYRICA) 50 MG capsule Take 50 mg 2 (two) times daily by mouth.  . ranitidine (ZANTAC) 300 MG tablet Take 300 mg by mouth at bedtime.  . traZODone (DESYREL) 100 MG tablet Take 100 mg at bedtime by mouth.     Allergies:   Aspirin and Amoxicillin   Social History   Socioeconomic History  . Marital status: Married    Spouse name: None  . Number of children: None  . Years of education: None  . Highest education level: None  Social Needs  . Financial resource strain: None  . Food insecurity - worry: None  . Food insecurity - inability: None  . Transportation needs - medical: None  . Transportation needs - non-medical: None  Occupational History  . None  Tobacco Use  . Smoking status: Former Smoker    Years: 10.00    Types: Cigarettes    Last attempt to quit: 07/02/1993    Years since quitting: 23.7  . Smokeless tobacco: Former Network engineer and Sexual Activity  . Alcohol use: No  . Drug use: No  . Sexual activity: None  Other Topics Concern  . None  Social History Narrative  . None     Family History: The patient's family history includes CAD in his father; Cancer in his mother; Diabetes in his father and sister; Heart attack in his father. ROS:   Please see the history of present illness.    All other systems reviewed and are negative.  EKGs/Labs/Other Studies Reviewed:    The following studies were reviewed today:   CTA 10/17/16 unchanged 43 mm ascending aorta enlargement Recent Labs:  Recent Lipid Panel   Physical Exam:    VS:  BP 110/78 (BP Location: Left Arm, Patient Position: Sitting)   Pulse 62   Ht 5\' 6"  (1.676 m)   Wt 273 lb (123.8 kg)   SpO2 98%   BMI 44.06 kg/m     Wt Readings from Last 3 Encounters:  04/10/17 273 lb (123.8 kg)  12/26/13 252 lb (114.3 kg)  12/19/13 249 lb 6 oz (113.1 kg)     GEN:  Well nourished, well developed in no acute distress HEENT: Normal NECK: No JVD; No carotid bruits LYMPHATICS: No  lymphadenopathy CARDIAC: No aortic regurgitationRRR, no murmurs, rubs, gallops RESPIRATORY:  Clear to auscultation without rales, wheezing or rhonchi  ABDOMEN: Soft, non-tender, non-distended MUSCULOSKELETAL:  No edema; No deformity  SKIN: Warm and dry NEUROLOGIC:  Alert and oriented x 3 PSYCHIATRIC:  Normal affect    Signed, Shirlee More, MD  04/10/2017 12:41 PM    [facility]

## 2017-04-10 ENCOUNTER — Encounter: Payer: Self-pay | Admitting: Cardiology

## 2017-04-10 ENCOUNTER — Ambulatory Visit (INDEPENDENT_AMBULATORY_CARE_PROVIDER_SITE_OTHER): Payer: Medicare Other | Admitting: Cardiology

## 2017-04-10 VITALS — BP 110/78 | HR 62 | Ht 66.0 in | Wt 273.0 lb

## 2017-04-10 DIAGNOSIS — I7789 Other specified disorders of arteries and arterioles: Secondary | ICD-10-CM | POA: Diagnosis not present

## 2017-04-10 DIAGNOSIS — E785 Hyperlipidemia, unspecified: Secondary | ICD-10-CM | POA: Diagnosis not present

## 2017-04-10 DIAGNOSIS — I2510 Atherosclerotic heart disease of native coronary artery without angina pectoris: Secondary | ICD-10-CM

## 2017-04-10 DIAGNOSIS — I10 Essential (primary) hypertension: Secondary | ICD-10-CM

## 2017-04-10 NOTE — Patient Instructions (Signed)

## 2017-04-16 DIAGNOSIS — R972 Elevated prostate specific antigen [PSA]: Secondary | ICD-10-CM | POA: Diagnosis not present

## 2017-04-21 DIAGNOSIS — Z8546 Personal history of malignant neoplasm of prostate: Secondary | ICD-10-CM | POA: Diagnosis not present

## 2017-04-29 DIAGNOSIS — Z96652 Presence of left artificial knee joint: Secondary | ICD-10-CM | POA: Diagnosis not present

## 2017-05-07 DIAGNOSIS — M48062 Spinal stenosis, lumbar region with neurogenic claudication: Secondary | ICD-10-CM | POA: Diagnosis not present

## 2017-05-07 DIAGNOSIS — M545 Low back pain: Secondary | ICD-10-CM | POA: Diagnosis not present

## 2017-05-07 DIAGNOSIS — M47816 Spondylosis without myelopathy or radiculopathy, lumbar region: Secondary | ICD-10-CM | POA: Diagnosis not present

## 2017-05-14 DIAGNOSIS — M47817 Spondylosis without myelopathy or radiculopathy, lumbosacral region: Secondary | ICD-10-CM | POA: Diagnosis not present

## 2017-05-14 DIAGNOSIS — M48062 Spinal stenosis, lumbar region with neurogenic claudication: Secondary | ICD-10-CM | POA: Diagnosis not present

## 2017-05-14 DIAGNOSIS — M545 Low back pain: Secondary | ICD-10-CM | POA: Diagnosis not present

## 2017-06-02 HISTORY — PX: BACK SURGERY: SHX140

## 2017-06-08 DIAGNOSIS — M5416 Radiculopathy, lumbar region: Secondary | ICD-10-CM | POA: Diagnosis not present

## 2017-06-15 DIAGNOSIS — M65332 Trigger finger, left middle finger: Secondary | ICD-10-CM | POA: Diagnosis not present

## 2017-06-26 DIAGNOSIS — E119 Type 2 diabetes mellitus without complications: Secondary | ICD-10-CM | POA: Diagnosis not present

## 2017-06-26 DIAGNOSIS — Z79899 Other long term (current) drug therapy: Secondary | ICD-10-CM | POA: Diagnosis not present

## 2017-06-26 DIAGNOSIS — E785 Hyperlipidemia, unspecified: Secondary | ICD-10-CM | POA: Diagnosis not present

## 2017-07-01 DIAGNOSIS — M5416 Radiculopathy, lumbar region: Secondary | ICD-10-CM | POA: Diagnosis not present

## 2017-07-07 DIAGNOSIS — L820 Inflamed seborrheic keratosis: Secondary | ICD-10-CM | POA: Diagnosis not present

## 2017-07-08 DIAGNOSIS — Z1339 Encounter for screening examination for other mental health and behavioral disorders: Secondary | ICD-10-CM | POA: Diagnosis not present

## 2017-07-08 DIAGNOSIS — Z6841 Body Mass Index (BMI) 40.0 and over, adult: Secondary | ICD-10-CM | POA: Diagnosis not present

## 2017-07-08 DIAGNOSIS — Z0000 Encounter for general adult medical examination without abnormal findings: Secondary | ICD-10-CM | POA: Diagnosis not present

## 2017-07-08 DIAGNOSIS — Z9181 History of falling: Secondary | ICD-10-CM | POA: Diagnosis not present

## 2017-07-08 DIAGNOSIS — I10 Essential (primary) hypertension: Secondary | ICD-10-CM | POA: Diagnosis not present

## 2017-07-16 DIAGNOSIS — M48062 Spinal stenosis, lumbar region with neurogenic claudication: Secondary | ICD-10-CM | POA: Diagnosis not present

## 2017-07-16 DIAGNOSIS — M545 Low back pain: Secondary | ICD-10-CM | POA: Diagnosis not present

## 2017-07-16 DIAGNOSIS — M4726 Other spondylosis with radiculopathy, lumbar region: Secondary | ICD-10-CM | POA: Diagnosis not present

## 2017-07-22 DIAGNOSIS — I10 Essential (primary) hypertension: Secondary | ICD-10-CM | POA: Diagnosis not present

## 2017-07-22 DIAGNOSIS — Z01818 Encounter for other preprocedural examination: Secondary | ICD-10-CM | POA: Diagnosis not present

## 2017-08-10 DIAGNOSIS — L03012 Cellulitis of left finger: Secondary | ICD-10-CM | POA: Diagnosis not present

## 2017-08-21 DIAGNOSIS — M4726 Other spondylosis with radiculopathy, lumbar region: Secondary | ICD-10-CM | POA: Diagnosis not present

## 2017-08-21 DIAGNOSIS — M48062 Spinal stenosis, lumbar region with neurogenic claudication: Secondary | ICD-10-CM | POA: Diagnosis not present

## 2017-08-21 DIAGNOSIS — Z4689 Encounter for fitting and adjustment of other specified devices: Secondary | ICD-10-CM | POA: Diagnosis not present

## 2017-08-21 DIAGNOSIS — M4316 Spondylolisthesis, lumbar region: Secondary | ICD-10-CM | POA: Diagnosis not present

## 2017-09-01 DIAGNOSIS — Z01818 Encounter for other preprocedural examination: Secondary | ICD-10-CM | POA: Diagnosis not present

## 2017-09-01 DIAGNOSIS — M4316 Spondylolisthesis, lumbar region: Secondary | ICD-10-CM | POA: Diagnosis not present

## 2017-09-01 DIAGNOSIS — R001 Bradycardia, unspecified: Secondary | ICD-10-CM | POA: Diagnosis not present

## 2017-09-08 DIAGNOSIS — M4316 Spondylolisthesis, lumbar region: Secondary | ICD-10-CM | POA: Diagnosis not present

## 2017-09-08 DIAGNOSIS — M7918 Myalgia, other site: Secondary | ICD-10-CM | POA: Diagnosis not present

## 2017-09-08 DIAGNOSIS — M8568 Other cyst of bone, other site: Secondary | ICD-10-CM | POA: Diagnosis present

## 2017-09-08 DIAGNOSIS — M48062 Spinal stenosis, lumbar region with neurogenic claudication: Secondary | ICD-10-CM | POA: Diagnosis not present

## 2017-09-08 DIAGNOSIS — M4326 Fusion of spine, lumbar region: Secondary | ICD-10-CM | POA: Diagnosis not present

## 2017-09-08 DIAGNOSIS — M4726 Other spondylosis with radiculopathy, lumbar region: Secondary | ICD-10-CM | POA: Diagnosis not present

## 2017-09-08 DIAGNOSIS — M545 Low back pain: Secondary | ICD-10-CM | POA: Diagnosis not present

## 2017-09-08 DIAGNOSIS — Z981 Arthrodesis status: Secondary | ICD-10-CM | POA: Diagnosis not present

## 2017-09-08 DIAGNOSIS — M5416 Radiculopathy, lumbar region: Secondary | ICD-10-CM | POA: Diagnosis not present

## 2017-10-09 DIAGNOSIS — M549 Dorsalgia, unspecified: Secondary | ICD-10-CM | POA: Diagnosis not present

## 2017-10-09 DIAGNOSIS — M545 Low back pain: Secondary | ICD-10-CM | POA: Diagnosis not present

## 2017-10-09 DIAGNOSIS — Z6841 Body Mass Index (BMI) 40.0 and over, adult: Secondary | ICD-10-CM | POA: Diagnosis not present

## 2017-10-09 DIAGNOSIS — M4326 Fusion of spine, lumbar region: Secondary | ICD-10-CM | POA: Diagnosis not present

## 2017-10-12 DIAGNOSIS — R7302 Impaired glucose tolerance (oral): Secondary | ICD-10-CM | POA: Diagnosis not present

## 2017-10-12 DIAGNOSIS — E119 Type 2 diabetes mellitus without complications: Secondary | ICD-10-CM | POA: Diagnosis not present

## 2017-10-21 DIAGNOSIS — Z79899 Other long term (current) drug therapy: Secondary | ICD-10-CM | POA: Diagnosis not present

## 2017-10-21 DIAGNOSIS — D649 Anemia, unspecified: Secondary | ICD-10-CM | POA: Diagnosis not present

## 2017-11-25 DIAGNOSIS — Z1339 Encounter for screening examination for other mental health and behavioral disorders: Secondary | ICD-10-CM | POA: Diagnosis not present

## 2017-11-25 DIAGNOSIS — R5383 Other fatigue: Secondary | ICD-10-CM | POA: Diagnosis not present

## 2017-11-25 DIAGNOSIS — Z79899 Other long term (current) drug therapy: Secondary | ICD-10-CM | POA: Diagnosis not present

## 2017-11-25 DIAGNOSIS — R7302 Impaired glucose tolerance (oral): Secondary | ICD-10-CM | POA: Diagnosis not present

## 2017-11-25 DIAGNOSIS — Z6841 Body Mass Index (BMI) 40.0 and over, adult: Secondary | ICD-10-CM | POA: Diagnosis not present

## 2017-11-25 DIAGNOSIS — D649 Anemia, unspecified: Secondary | ICD-10-CM | POA: Diagnosis not present

## 2017-11-25 DIAGNOSIS — R252 Cramp and spasm: Secondary | ICD-10-CM | POA: Diagnosis not present

## 2017-12-09 DIAGNOSIS — M4326 Fusion of spine, lumbar region: Secondary | ICD-10-CM | POA: Diagnosis not present

## 2018-01-04 DIAGNOSIS — E119 Type 2 diabetes mellitus without complications: Secondary | ICD-10-CM | POA: Diagnosis not present

## 2018-01-13 ENCOUNTER — Encounter: Payer: Self-pay | Admitting: Gastroenterology

## 2018-01-13 DIAGNOSIS — Z6841 Body Mass Index (BMI) 40.0 and over, adult: Secondary | ICD-10-CM | POA: Diagnosis not present

## 2018-01-13 DIAGNOSIS — R7302 Impaired glucose tolerance (oral): Secondary | ICD-10-CM | POA: Diagnosis not present

## 2018-02-09 DIAGNOSIS — H52223 Regular astigmatism, bilateral: Secondary | ICD-10-CM | POA: Diagnosis not present

## 2018-02-09 DIAGNOSIS — I10 Essential (primary) hypertension: Secondary | ICD-10-CM | POA: Diagnosis not present

## 2018-02-09 DIAGNOSIS — H524 Presbyopia: Secondary | ICD-10-CM | POA: Diagnosis not present

## 2018-02-09 DIAGNOSIS — H25813 Combined forms of age-related cataract, bilateral: Secondary | ICD-10-CM | POA: Diagnosis not present

## 2018-02-09 DIAGNOSIS — H5203 Hypermetropia, bilateral: Secondary | ICD-10-CM | POA: Diagnosis not present

## 2018-02-23 ENCOUNTER — Ambulatory Visit (AMBULATORY_SURGERY_CENTER): Payer: Self-pay | Admitting: *Deleted

## 2018-02-23 VITALS — Ht 66.0 in | Wt 261.0 lb

## 2018-02-23 DIAGNOSIS — Z86010 Personal history of colonic polyps: Secondary | ICD-10-CM

## 2018-02-23 MED ORDER — SOD PICOSULFATE-MAG OX-CIT ACD 10-3.5-12 MG-GM -GM/160ML PO SOLN: 1.0000 | ORAL | 0 refills | Status: DC

## 2018-02-23 NOTE — Progress Notes (Signed)
No egg or soy allergy known to patient  No issues with past sedation with any surgeries  or procedures, no intubation problems  No diet pills per patient No home 02 use per patient  No blood thinners per patient  Pt denies issues with constipation  No A fib or A flutter  EMMI video sent to pt's e mail  Pt states last colon with Dr Earlean Shawl- Dr Steve Rattler last OV states 2014 1 polyp with 5 yr recall  Clenpiq sample  Lot [id number]  Exp 04-2018

## 2018-03-04 DIAGNOSIS — M1711 Unilateral primary osteoarthritis, right knee: Secondary | ICD-10-CM | POA: Diagnosis not present

## 2018-03-09 ENCOUNTER — Encounter: Payer: Self-pay | Admitting: Gastroenterology

## 2018-03-09 ENCOUNTER — Ambulatory Visit (AMBULATORY_SURGERY_CENTER): Payer: Medicare Other | Admitting: Gastroenterology

## 2018-03-09 VITALS — BP 105/60 | HR 47 | Temp 97.3°F | Resp 18 | Ht 66.0 in | Wt 261.0 lb

## 2018-03-09 DIAGNOSIS — Z86010 Personal history of colonic polyps: Secondary | ICD-10-CM | POA: Diagnosis not present

## 2018-03-09 MED ORDER — SODIUM CHLORIDE 0.9 % IV SOLN: 500.0000 mL | Freq: Once | INTRAVENOUS | Status: DC

## 2018-03-09 NOTE — Patient Instructions (Signed)
YOU HAD AN ENDOSCOPIC PROCEDURE TODAY AT [facility]:   Refer to the procedure report that was given to you for any specific questions about what was found during the examination.  If the procedure report does not answer your questions, please call your gastroenterologist to clarify.  If you requested that your care partner not be given the details of your procedure findings, then the procedure report has been included in a sealed envelope for you to review at your convenience later.  YOU SHOULD EXPECT: Some feelings of bloating in the abdomen. Passage of more gas than usual.  Walking can help get rid of the air that was put into your GI tract during the procedure and reduce the bloating. If you had a lower endoscopy (such as a colonoscopy or flexible sigmoidoscopy) you may notice spotting of blood in your stool or on the toilet paper. If you underwent a bowel prep for your procedure, you may not have a normal bowel movement for a few days.  Please Note:  You might notice some irritation and congestion in your nose or some drainage.  This is from the oxygen used during your procedure.  There is no need for concern and it should clear up in a day or so.  SYMPTOMS TO REPORT IMMEDIATELY:   Following lower endoscopy (colonoscopy or flexible sigmoidoscopy):  Excessive amounts of blood in the stool  Significant tenderness or worsening of abdominal pains  Swelling of the abdomen that is new, acute  Fever of 100F or higher   For urgent or emergent issues, a gastroenterologist can be reached at any hour by calling [phone].   DIET:  We do recommend a small meal at first, but then you may proceed to your regular diet.  Drink plenty of fluids but you should avoid alcoholic beverages for 24 hours.  ACTIVITY:  You should plan to take it easy for the rest of today and you should NOT DRIVE or use heavy machinery until tomorrow (because of the sedation medicines used during the test).     FOLLOW UP: Our staff will call the number listed on your records the next business day following your procedure to check on you and address any questions or concerns that you may have regarding the information given to you following your procedure. If we do not reach you, we will leave a message.  However, if you are feeling well and you are not experiencing any problems, there is no need to return our call.  We will assume that you have returned to your regular daily activities without incident.  If any biopsies were taken you will be contacted by phone or by letter within the next 1-3 weeks.  Please call us at [phone] if you have not heard about the biopsies in 3 weeks.    SIGNATURES/CONFIDENTIALITY: You and/or your care partner have signed paperwork which will be entered into your electronic medical record.  These signatures attest to the fact that that the information above on your After Visit Summary has been reviewed and is understood.  Full responsibility of the confidentiality of this discharge information lies with you and/or your care-partner.  Hemorrhoid, diverticulosis, and high fiber diet information given.

## 2018-03-09 NOTE — Op Note (Signed)
[facility] Patient Name: Joseph Pham Procedure Date: 03/09/2018 9:32 AM MRN: [id number] Endoscopist: Jackquline Denmark , MD Age: 67 Referring MD:  Date of Birth: [birth year] Gender: Male Account #: [id number] Procedure:                Colonoscopy Indications:              High risk colon cancer surveillance: Personal                            history of colonic polyps Medicines:                Monitored Anesthesia Care Procedure:                Pre-Anesthesia Assessment:                           - Prior to the procedure, a History and Physical                            was performed, and patient medications and                            allergies were reviewed. The patient's tolerance of                            previous anesthesia was also reviewed. The risks                            and benefits of the procedure and the sedation                            options and risks were discussed with the patient.                            All questions were answered, and informed consent                            was obtained. Prior Anticoagulants: The patient has                            taken no previous anticoagulant or antiplatelet                            agents. ASA Grade Assessment: II - A patient with                            mild systemic disease. After reviewing the risks                            and benefits, the patient was deemed in                            satisfactory condition to undergo the procedure.  After obtaining informed consent, the colonoscope                            was passed under direct vision. Throughout the                            procedure, the patient's blood pressure, pulse, and                            oxygen saturations were monitored continuously. The                            Colonoscope was introduced through the anus and                            advanced to the the cecum, identified by                           appendiceal orifice and ileocecal valve. The                            colonoscopy was performed without difficulty. The                            patient tolerated the procedure well. The quality                            of the bowel preparation was excellent. Scope In: 9:38:52 AM Scope Out: 9:47:14 AM Scope Withdrawal Time: 0 hours 5 minutes 47 seconds  Total Procedure Duration: 0 hours 8 minutes 22 seconds  Findings:                 Multiple small-mouthed diverticula were found in                            the sigmoid colon, few in descending colon and                            ascending colon.                           Non-bleeding internal hemorrhoids and external                            hemorrhoids were found during retroflexion and                            during perianal exam. The hemorrhoids were small.                            Anorectal scar consistent with previous hemorrhoid                            surgery.  The exam was otherwise without abnormality. Complications:            No immediate complications. Estimated Blood Loss:     Estimated blood loss: none. Impression:               -Pancolonic diverticulosis predominantly in the                            sigmoid colon.                           -Non-bleeding internal hemorrhoids.                           -The examination was otherwise normal. Recommendation:           - Patient has a contact number available for                            emergencies. The signs and symptoms of potential                            delayed complications were discussed with the                            patient. Return to normal activities tomorrow.                            Written discharge instructions were provided to the                            patient.                           - High fiber diet.                           - Continue present medications.                            - Repeat colonoscopy in 10 years for screening                            purposes.                           - Return to GI clinic PRN. Jackquline Denmark, MD 03/09/2018 9:52:55 AM This report has been signed electronically.

## 2018-03-09 NOTE — Progress Notes (Signed)
Spontaneous respirations throughout. VSS. Resting comfortably. To PACU on room air. Report to  RN. 

## 2018-03-09 NOTE — Progress Notes (Signed)
Pt's states no medical or surgical changes since previsit or office visit. 

## 2018-03-10 ENCOUNTER — Telehealth: Payer: Self-pay

## 2018-03-10 NOTE — Telephone Encounter (Signed)
  Follow up Call-  Call back number 03/09/2018  Post procedure Call Back phone  # [phone]  Permission to leave phone message Yes  Some recent data might be hidden     Patient questions:  Do you have a fever, pain , or abdominal swelling? No. Pain Score  0 *  Have you tolerated food without any problems? Yes.    Have you been able to return to your normal activities? Yes.    Do you have any questions about your discharge instructions: Diet   No. Medications  No. Follow up visit  No.  Do you have questions or concerns about your Care? No.  Actions: * If pain score is 4 or above: No action needed, pain <4.

## 2018-03-11 DIAGNOSIS — M4326 Fusion of spine, lumbar region: Secondary | ICD-10-CM | POA: Diagnosis not present

## 2018-03-11 DIAGNOSIS — M545 Low back pain: Secondary | ICD-10-CM | POA: Diagnosis not present

## 2018-03-22 ENCOUNTER — Telehealth: Payer: Self-pay | Admitting: Cardiology

## 2018-03-22 NOTE — Telephone Encounter (Signed)
Patient states he has been very short of breath the past couple days.  He has an appt with [facility] Thursday 03/25/2018 but is concerned and thinks he needs to seen sooner.  Please call patient to discuss at [phone]

## 2018-03-25 NOTE — Telephone Encounter (Signed)
done

## 2018-04-06 DIAGNOSIS — D1801 Hemangioma of skin and subcutaneous tissue: Secondary | ICD-10-CM | POA: Diagnosis not present

## 2018-04-06 DIAGNOSIS — L821 Other seborrheic keratosis: Secondary | ICD-10-CM | POA: Diagnosis not present

## 2018-04-06 DIAGNOSIS — L300 Nummular dermatitis: Secondary | ICD-10-CM | POA: Diagnosis not present

## 2018-04-06 DIAGNOSIS — L570 Actinic keratosis: Secondary | ICD-10-CM | POA: Diagnosis not present

## 2018-04-13 ENCOUNTER — Ambulatory Visit (INDEPENDENT_AMBULATORY_CARE_PROVIDER_SITE_OTHER): Payer: Medicare Other | Admitting: Cardiology

## 2018-04-13 ENCOUNTER — Encounter: Payer: Self-pay | Admitting: *Deleted

## 2018-04-13 ENCOUNTER — Encounter: Payer: Self-pay | Admitting: Cardiology

## 2018-04-13 VITALS — BP 112/68 | HR 62 | Ht 66.0 in | Wt 259.8 lb

## 2018-04-13 DIAGNOSIS — I712 Thoracic aortic aneurysm, without rupture, unspecified: Secondary | ICD-10-CM

## 2018-04-13 DIAGNOSIS — I7789 Other specified disorders of arteries and arterioles: Secondary | ICD-10-CM

## 2018-04-13 DIAGNOSIS — I10 Essential (primary) hypertension: Secondary | ICD-10-CM | POA: Diagnosis not present

## 2018-04-13 DIAGNOSIS — E785 Hyperlipidemia, unspecified: Secondary | ICD-10-CM | POA: Diagnosis not present

## 2018-04-13 DIAGNOSIS — R079 Chest pain, unspecified: Secondary | ICD-10-CM

## 2018-04-13 HISTORY — DX: Chest pain, unspecified: R07.9
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
## 2018-04-13 NOTE — Patient Instructions (Signed)
Medication Instructions:  Your physician recommends that you continue on your current medications as directed. Please refer to the Current Medication list given to you today.  If you need a refill on your cardiac medications before your next appointment, please call your pharmacy.   Lab work: None  If you have labs (blood work) drawn today and your tests are completely normal, you will receive your results only by: Marland Kitchen MyChart Message (if you have MyChart) OR . A paper copy in the mail If you have any lab test that is abnormal or we need to change your treatment, we will call you to review the results.  Testing/Procedures: You had an EKG today.   Non-Cardiac CT scanning, (CAT scanning), is a noninvasive, special x-ray that produces cross-sectional images of the body using x-rays and a computer. CT scans help physicians diagnose and treat medical conditions. For some CT exams, a contrast material is used to enhance visibility in the area of the body being studied. CT scans provide greater clarity and reveal more details than regular x-ray exams.  Your physician has requested that you have en exercise stress myoview. For further information please visit [email or web address]. Please follow instruction sheet, as given.  Follow-Up: At [facility], you and your health needs are our priority.  As part of our continuing mission to provide you with exceptional heart care, we have created designated Provider Care Teams.  These Care Teams include your primary Cardiologist (physician) and Advanced Practice Providers (APPs -  Physician Assistants and Nurse Practitioners) who all work together to provide you with the care you need, when you need it. You will need a follow up appointment in 1 years.  Please call our office 2 months in advance to schedule this appointment.        Exercise Stress Electrocardiogram An exercise stress electrocardiogram is a test to check how blood flows to your heart. It  is done to find areas of poor blood flow. You will need to walk on a treadmill for this test. The electrocardiogram will record your heartbeat when you are at rest and when you are exercising. What happens before the procedure?  Do not have drinks with caffeine or foods with caffeine for 24 hours before the test, or as told by your doctor. This includes coffee, tea (even decaf tea), sodas, chocolate, and cocoa.  Follow your doctor's instructions about eating and drinking before the test.  Ask your doctor what medicines you should or should not take before the test. Take your medicines with water unless told by your doctor not to.  If you use an inhaler, bring it with you to the test.  Bring a snack to eat after the test.  Do not  smoke for 4 hours before the test.  Do not put lotions, powders, creams, or oils on your chest before the test.  Wear comfortable shoes and clothing. What happens during the procedure?  You will have patches put on your chest. Small areas of your chest may need to be shaved. Wires will be connected to the patches.  Your heart rate will be watched while you are resting and while you are exercising.  You will walk on the treadmill. The treadmill will slowly get faster to raise your heart rate.  The test will take about 1-2 hours. What happens after the procedure?  Your heart rate and blood pressure will be watched after the test.  You may return to your normal diet, activities, and medicines or as  told by your doctor. This information is not intended to replace advice given to you by your health care provider. Make sure you discuss any questions you have with your health care provider. Document Released: 11/05/2007 Document Revised: 01/16/2016 Document Reviewed: 01/24/2013 Elsevier Interactive Patient Education  [employer].

## 2018-04-14 DIAGNOSIS — C61 Malignant neoplasm of prostate: Secondary | ICD-10-CM | POA: Diagnosis not present

## 2018-04-15 DIAGNOSIS — M1711 Unilateral primary osteoarthritis, right knee: Secondary | ICD-10-CM | POA: Diagnosis not present

## 2018-04-19 DIAGNOSIS — I7789 Other specified disorders of arteries and arterioles: Secondary | ICD-10-CM | POA: Diagnosis not present

## 2018-04-19 DIAGNOSIS — I712 Thoracic aortic aneurysm, without rupture: Secondary | ICD-10-CM | POA: Diagnosis not present

## 2018-04-19 DIAGNOSIS — I714 Abdominal aortic aneurysm, without rupture: Secondary | ICD-10-CM | POA: Diagnosis not present

## 2018-04-20 DIAGNOSIS — Z8546 Personal history of malignant neoplasm of prostate: Secondary | ICD-10-CM | POA: Diagnosis not present

## 2018-04-20 DIAGNOSIS — N200 Calculus of kidney: Secondary | ICD-10-CM | POA: Diagnosis not present

## 2018-04-20 DIAGNOSIS — N3944 Nocturnal enuresis: Secondary | ICD-10-CM | POA: Diagnosis not present

## 2018-05-13 ENCOUNTER — Telehealth (HOSPITAL_COMMUNITY): Payer: Self-pay | Admitting: *Deleted

## 2018-05-13 NOTE — Telephone Encounter (Signed)
Patient given detailed instructions per Myocardial Perfusion Study Information Sheet for the test on 05/18/18. Patient notified to arrive 15 minutes early and that it is imperative to arrive on time for appointment to keep from having the test rescheduled.  If you need to cancel or reschedule your appointment, please call the office within 24 hours of your appointment. . Patient verbalized understanding.Kirstie Peri

## 2018-05-18 ENCOUNTER — Ambulatory Visit (INDEPENDENT_AMBULATORY_CARE_PROVIDER_SITE_OTHER): Payer: Medicare Other

## 2018-05-18 VITALS — Ht 66.0 in | Wt 259.0 lb

## 2018-05-18 DIAGNOSIS — I2510 Atherosclerotic heart disease of native coronary artery without angina pectoris: Secondary | ICD-10-CM | POA: Diagnosis not present

## 2018-05-18 DIAGNOSIS — R079 Chest pain, unspecified: Secondary | ICD-10-CM

## 2018-05-18 LAB — MYOCARDIAL PERFUSION IMAGING
LV dias vol: 125 mL (ref 62–150)
LV sys vol: 56 mL
Peak HR: 70 {beats}/min
Rest HR: 52 {beats}/min
SDS: 2
SRS: 0
SSS: 2
TID: 1.25

## 2018-05-18 MED ORDER — TECHNETIUM TC 99M TETROFOSMIN IV KIT
9.7000 | PACK | Freq: Once | INTRAVENOUS | Status: AC | PRN
  Administered 2018-05-18: 9.7 via INTRAVENOUS

## 2018-05-18 MED ORDER — REGADENOSON 0.4 MG/5ML IV SOLN
0.4000 mg | Freq: Once | INTRAVENOUS | Status: AC
  Administered 2018-05-18: 0.4 mg via INTRAVENOUS

## 2018-05-18 MED ORDER — TECHNETIUM TC 99M TETROFOSMIN IV KIT
29.7000 | PACK | Freq: Once | INTRAVENOUS | Status: AC | PRN
  Administered 2018-05-18: 29.7 via INTRAVENOUS

## 2018-06-07 DIAGNOSIS — M1711 Unilateral primary osteoarthritis, right knee: Secondary | ICD-10-CM | POA: Diagnosis not present

## 2018-06-23 DIAGNOSIS — Z79899 Other long term (current) drug therapy: Secondary | ICD-10-CM | POA: Diagnosis not present

## 2018-06-23 DIAGNOSIS — R52 Pain, unspecified: Secondary | ICD-10-CM | POA: Diagnosis not present

## 2018-06-23 DIAGNOSIS — Z01818 Encounter for other preprocedural examination: Secondary | ICD-10-CM | POA: Diagnosis not present

## 2018-06-23 DIAGNOSIS — M79609 Pain in unspecified limb: Secondary | ICD-10-CM | POA: Diagnosis not present

## 2018-06-23 DIAGNOSIS — Z01811 Encounter for preprocedural respiratory examination: Secondary | ICD-10-CM | POA: Diagnosis not present

## 2018-06-23 DIAGNOSIS — E559 Vitamin D deficiency, unspecified: Secondary | ICD-10-CM | POA: Diagnosis not present

## 2018-06-24 DIAGNOSIS — G4733 Obstructive sleep apnea (adult) (pediatric): Secondary | ICD-10-CM | POA: Diagnosis not present

## 2018-06-24 DIAGNOSIS — Z6841 Body Mass Index (BMI) 40.0 and over, adult: Secondary | ICD-10-CM | POA: Diagnosis not present

## 2018-06-24 DIAGNOSIS — Z9989 Dependence on other enabling machines and devices: Secondary | ICD-10-CM | POA: Diagnosis not present

## 2018-07-19 DIAGNOSIS — M79609 Pain in unspecified limb: Secondary | ICD-10-CM | POA: Diagnosis not present

## 2018-07-19 DIAGNOSIS — Z79899 Other long term (current) drug therapy: Secondary | ICD-10-CM | POA: Diagnosis not present

## 2018-07-19 DIAGNOSIS — R52 Pain, unspecified: Secondary | ICD-10-CM | POA: Diagnosis not present

## 2018-07-26 DIAGNOSIS — H1033 Unspecified acute conjunctivitis, bilateral: Secondary | ICD-10-CM | POA: Diagnosis not present

## 2018-08-01 HISTORY — PX: TOTAL KNEE ARTHROPLASTY: SHX125

## 2018-08-09 DIAGNOSIS — Z79899 Other long term (current) drug therapy: Secondary | ICD-10-CM | POA: Diagnosis not present

## 2018-08-09 DIAGNOSIS — M79609 Pain in unspecified limb: Secondary | ICD-10-CM | POA: Diagnosis not present

## 2018-08-09 DIAGNOSIS — R52 Pain, unspecified: Secondary | ICD-10-CM | POA: Diagnosis not present

## 2018-08-10 DIAGNOSIS — Z8546 Personal history of malignant neoplasm of prostate: Secondary | ICD-10-CM | POA: Diagnosis not present

## 2018-08-10 DIAGNOSIS — Z87891 Personal history of nicotine dependence: Secondary | ICD-10-CM | POA: Diagnosis not present

## 2018-08-10 DIAGNOSIS — M1711 Unilateral primary osteoarthritis, right knee: Secondary | ICD-10-CM | POA: Diagnosis not present

## 2018-08-10 DIAGNOSIS — Z79899 Other long term (current) drug therapy: Secondary | ICD-10-CM | POA: Diagnosis not present

## 2018-08-10 DIAGNOSIS — Z471 Aftercare following joint replacement surgery: Secondary | ICD-10-CM | POA: Diagnosis not present

## 2018-08-10 DIAGNOSIS — E119 Type 2 diabetes mellitus without complications: Secondary | ICD-10-CM | POA: Diagnosis not present

## 2018-08-10 DIAGNOSIS — I10 Essential (primary) hypertension: Secondary | ICD-10-CM | POA: Diagnosis not present

## 2018-08-10 DIAGNOSIS — E7800 Pure hypercholesterolemia, unspecified: Secondary | ICD-10-CM | POA: Diagnosis not present

## 2018-08-10 DIAGNOSIS — Z6841 Body Mass Index (BMI) 40.0 and over, adult: Secondary | ICD-10-CM | POA: Diagnosis not present

## 2018-08-10 DIAGNOSIS — G4733 Obstructive sleep apnea (adult) (pediatric): Secondary | ICD-10-CM | POA: Diagnosis not present

## 2018-08-10 DIAGNOSIS — Z96651 Presence of right artificial knee joint: Secondary | ICD-10-CM | POA: Diagnosis not present

## 2018-08-11 DIAGNOSIS — I10 Essential (primary) hypertension: Secondary | ICD-10-CM | POA: Diagnosis not present

## 2018-08-11 DIAGNOSIS — M1711 Unilateral primary osteoarthritis, right knee: Secondary | ICD-10-CM | POA: Diagnosis not present

## 2018-08-11 DIAGNOSIS — Z6841 Body Mass Index (BMI) 40.0 and over, adult: Secondary | ICD-10-CM | POA: Diagnosis not present

## 2018-08-11 DIAGNOSIS — E119 Type 2 diabetes mellitus without complications: Secondary | ICD-10-CM | POA: Diagnosis not present

## 2018-08-11 DIAGNOSIS — E7800 Pure hypercholesterolemia, unspecified: Secondary | ICD-10-CM | POA: Diagnosis not present

## 2018-08-12 DIAGNOSIS — Z6841 Body Mass Index (BMI) 40.0 and over, adult: Secondary | ICD-10-CM | POA: Diagnosis not present

## 2018-08-12 DIAGNOSIS — E7800 Pure hypercholesterolemia, unspecified: Secondary | ICD-10-CM | POA: Diagnosis not present

## 2018-08-12 DIAGNOSIS — M1711 Unilateral primary osteoarthritis, right knee: Secondary | ICD-10-CM | POA: Diagnosis not present

## 2018-08-12 DIAGNOSIS — E119 Type 2 diabetes mellitus without complications: Secondary | ICD-10-CM | POA: Diagnosis not present

## 2018-08-12 DIAGNOSIS — I10 Essential (primary) hypertension: Secondary | ICD-10-CM | POA: Diagnosis not present

## 2018-08-13 DIAGNOSIS — Z7901 Long term (current) use of anticoagulants: Secondary | ICD-10-CM | POA: Diagnosis not present

## 2018-08-13 DIAGNOSIS — E559 Vitamin D deficiency, unspecified: Secondary | ICD-10-CM | POA: Diagnosis not present

## 2018-08-13 DIAGNOSIS — E119 Type 2 diabetes mellitus without complications: Secondary | ICD-10-CM | POA: Diagnosis not present

## 2018-08-13 DIAGNOSIS — I10 Essential (primary) hypertension: Secondary | ICD-10-CM | POA: Diagnosis not present

## 2018-08-13 DIAGNOSIS — Z96652 Presence of left artificial knee joint: Secondary | ICD-10-CM | POA: Diagnosis not present

## 2018-08-13 DIAGNOSIS — Z79891 Long term (current) use of opiate analgesic: Secondary | ICD-10-CM | POA: Diagnosis not present

## 2018-08-13 DIAGNOSIS — Z6838 Body mass index (BMI) 38.0-38.9, adult: Secondary | ICD-10-CM | POA: Diagnosis not present

## 2018-08-13 DIAGNOSIS — Z9181 History of falling: Secondary | ICD-10-CM | POA: Diagnosis not present

## 2018-08-13 DIAGNOSIS — Z471 Aftercare following joint replacement surgery: Secondary | ICD-10-CM | POA: Diagnosis not present

## 2018-08-13 DIAGNOSIS — Z96651 Presence of right artificial knee joint: Secondary | ICD-10-CM | POA: Diagnosis not present

## 2018-08-16 DIAGNOSIS — E559 Vitamin D deficiency, unspecified: Secondary | ICD-10-CM | POA: Diagnosis not present

## 2018-08-16 DIAGNOSIS — Z96651 Presence of right artificial knee joint: Secondary | ICD-10-CM | POA: Diagnosis not present

## 2018-08-16 DIAGNOSIS — Z471 Aftercare following joint replacement surgery: Secondary | ICD-10-CM | POA: Diagnosis not present

## 2018-08-16 DIAGNOSIS — E119 Type 2 diabetes mellitus without complications: Secondary | ICD-10-CM | POA: Diagnosis not present

## 2018-08-16 DIAGNOSIS — I10 Essential (primary) hypertension: Secondary | ICD-10-CM | POA: Diagnosis not present

## 2018-08-17 DIAGNOSIS — Z471 Aftercare following joint replacement surgery: Secondary | ICD-10-CM | POA: Diagnosis not present

## 2018-08-17 DIAGNOSIS — R7302 Impaired glucose tolerance (oral): Secondary | ICD-10-CM | POA: Diagnosis not present

## 2018-08-17 DIAGNOSIS — Z96651 Presence of right artificial knee joint: Secondary | ICD-10-CM | POA: Diagnosis not present

## 2018-08-17 DIAGNOSIS — Z6841 Body Mass Index (BMI) 40.0 and over, adult: Secondary | ICD-10-CM | POA: Diagnosis not present

## 2018-08-17 DIAGNOSIS — I10 Essential (primary) hypertension: Secondary | ICD-10-CM | POA: Diagnosis not present

## 2018-08-17 DIAGNOSIS — E559 Vitamin D deficiency, unspecified: Secondary | ICD-10-CM | POA: Diagnosis not present

## 2018-08-17 DIAGNOSIS — Z23 Encounter for immunization: Secondary | ICD-10-CM | POA: Diagnosis not present

## 2018-08-17 DIAGNOSIS — E119 Type 2 diabetes mellitus without complications: Secondary | ICD-10-CM | POA: Diagnosis not present

## 2018-08-18 DIAGNOSIS — I10 Essential (primary) hypertension: Secondary | ICD-10-CM | POA: Diagnosis not present

## 2018-08-18 DIAGNOSIS — E119 Type 2 diabetes mellitus without complications: Secondary | ICD-10-CM | POA: Diagnosis not present

## 2018-08-18 DIAGNOSIS — Z96651 Presence of right artificial knee joint: Secondary | ICD-10-CM | POA: Diagnosis not present

## 2018-08-18 DIAGNOSIS — Z471 Aftercare following joint replacement surgery: Secondary | ICD-10-CM | POA: Diagnosis not present

## 2018-08-18 DIAGNOSIS — E559 Vitamin D deficiency, unspecified: Secondary | ICD-10-CM | POA: Diagnosis not present

## 2018-08-19 DIAGNOSIS — I10 Essential (primary) hypertension: Secondary | ICD-10-CM | POA: Diagnosis not present

## 2018-08-19 DIAGNOSIS — Z471 Aftercare following joint replacement surgery: Secondary | ICD-10-CM | POA: Diagnosis not present

## 2018-08-19 DIAGNOSIS — Z96651 Presence of right artificial knee joint: Secondary | ICD-10-CM | POA: Diagnosis not present

## 2018-08-19 DIAGNOSIS — E119 Type 2 diabetes mellitus without complications: Secondary | ICD-10-CM | POA: Diagnosis not present

## 2018-08-19 DIAGNOSIS — E559 Vitamin D deficiency, unspecified: Secondary | ICD-10-CM | POA: Diagnosis not present

## 2018-08-20 DIAGNOSIS — I10 Essential (primary) hypertension: Secondary | ICD-10-CM | POA: Diagnosis not present

## 2018-08-20 DIAGNOSIS — E559 Vitamin D deficiency, unspecified: Secondary | ICD-10-CM | POA: Diagnosis not present

## 2018-08-20 DIAGNOSIS — E119 Type 2 diabetes mellitus without complications: Secondary | ICD-10-CM | POA: Diagnosis not present

## 2018-08-20 DIAGNOSIS — Z96651 Presence of right artificial knee joint: Secondary | ICD-10-CM | POA: Diagnosis not present

## 2018-08-20 DIAGNOSIS — Z471 Aftercare following joint replacement surgery: Secondary | ICD-10-CM | POA: Diagnosis not present

## 2018-08-24 DIAGNOSIS — M25561 Pain in right knee: Secondary | ICD-10-CM | POA: Diagnosis not present

## 2018-08-24 DIAGNOSIS — M25661 Stiffness of right knee, not elsewhere classified: Secondary | ICD-10-CM | POA: Diagnosis not present

## 2018-08-24 DIAGNOSIS — R2689 Other abnormalities of gait and mobility: Secondary | ICD-10-CM | POA: Diagnosis not present

## 2018-08-24 DIAGNOSIS — Z96651 Presence of right artificial knee joint: Secondary | ICD-10-CM | POA: Diagnosis not present

## 2018-08-26 DIAGNOSIS — M25561 Pain in right knee: Secondary | ICD-10-CM | POA: Diagnosis not present

## 2018-08-26 DIAGNOSIS — Z96651 Presence of right artificial knee joint: Secondary | ICD-10-CM | POA: Diagnosis not present

## 2018-08-26 DIAGNOSIS — R2689 Other abnormalities of gait and mobility: Secondary | ICD-10-CM | POA: Diagnosis not present

## 2018-08-26 DIAGNOSIS — M25661 Stiffness of right knee, not elsewhere classified: Secondary | ICD-10-CM | POA: Diagnosis not present

## 2018-08-30 DIAGNOSIS — M25661 Stiffness of right knee, not elsewhere classified: Secondary | ICD-10-CM | POA: Diagnosis not present

## 2018-08-30 DIAGNOSIS — R2689 Other abnormalities of gait and mobility: Secondary | ICD-10-CM | POA: Diagnosis not present

## 2018-08-30 DIAGNOSIS — M25561 Pain in right knee: Secondary | ICD-10-CM | POA: Diagnosis not present

## 2018-08-30 DIAGNOSIS — Z96651 Presence of right artificial knee joint: Secondary | ICD-10-CM | POA: Diagnosis not present

## 2018-09-21 DIAGNOSIS — M25561 Pain in right knee: Secondary | ICD-10-CM | POA: Diagnosis not present

## 2018-09-21 DIAGNOSIS — Z96651 Presence of right artificial knee joint: Secondary | ICD-10-CM | POA: Diagnosis not present

## 2018-10-19 DIAGNOSIS — Z8546 Personal history of malignant neoplasm of prostate: Secondary | ICD-10-CM | POA: Diagnosis not present

## 2018-11-03 DIAGNOSIS — M25561 Pain in right knee: Secondary | ICD-10-CM | POA: Diagnosis not present

## 2018-12-23 DIAGNOSIS — Z79899 Other long term (current) drug therapy: Secondary | ICD-10-CM | POA: Diagnosis not present

## 2018-12-23 DIAGNOSIS — E785 Hyperlipidemia, unspecified: Secondary | ICD-10-CM | POA: Diagnosis not present

## 2018-12-23 DIAGNOSIS — R5383 Other fatigue: Secondary | ICD-10-CM | POA: Diagnosis not present

## 2018-12-23 DIAGNOSIS — R7302 Impaired glucose tolerance (oral): Secondary | ICD-10-CM | POA: Diagnosis not present

## 2018-12-23 DIAGNOSIS — Z0000 Encounter for general adult medical examination without abnormal findings: Secondary | ICD-10-CM | POA: Diagnosis not present

## 2018-12-23 DIAGNOSIS — Z1331 Encounter for screening for depression: Secondary | ICD-10-CM | POA: Diagnosis not present

## 2018-12-29 DIAGNOSIS — M545 Low back pain: Secondary | ICD-10-CM | POA: Diagnosis not present

## 2019-02-01 DIAGNOSIS — I2699 Other pulmonary embolism without acute cor pulmonale: Secondary | ICD-10-CM

## 2019-02-01 HISTORY — DX: Other pulmonary embolism without acute cor pulmonale: I26.99

## 2019-02-02 ENCOUNTER — Emergency Department (HOSPITAL_COMMUNITY): Payer: Medicare Other

## 2019-02-02 ENCOUNTER — Inpatient Hospital Stay (HOSPITAL_COMMUNITY)
Admission: EM | Admit: 2019-02-02 | Discharge: 2019-02-04 | DRG: 176 | Disposition: A | Discharge status: Home / Self Care | Payer: Medicare Other | Attending: Internal Medicine | Admitting: Internal Medicine

## 2019-02-02 ENCOUNTER — Encounter (HOSPITAL_COMMUNITY): Payer: Self-pay

## 2019-02-02 ENCOUNTER — Other Ambulatory Visit: Payer: Self-pay

## 2019-02-02 ENCOUNTER — Observation Stay (HOSPITAL_COMMUNITY): Payer: Medicare Other

## 2019-02-02 DIAGNOSIS — I7789 Other specified disorders of arteries and arterioles: Secondary | ICD-10-CM | POA: Diagnosis not present

## 2019-02-02 DIAGNOSIS — N179 Acute kidney failure, unspecified: Secondary | ICD-10-CM | POA: Diagnosis not present

## 2019-02-02 DIAGNOSIS — R0602 Shortness of breath: Secondary | ICD-10-CM | POA: Diagnosis not present

## 2019-02-02 DIAGNOSIS — G4733 Obstructive sleep apnea (adult) (pediatric): Secondary | ICD-10-CM | POA: Diagnosis present

## 2019-02-02 DIAGNOSIS — Z833 Family history of diabetes mellitus: Secondary | ICD-10-CM

## 2019-02-02 DIAGNOSIS — I2699 Other pulmonary embolism without acute cor pulmonale: Principal | ICD-10-CM | POA: Diagnosis present

## 2019-02-02 DIAGNOSIS — K219 Gastro-esophageal reflux disease without esophagitis: Secondary | ICD-10-CM | POA: Diagnosis not present

## 2019-02-02 DIAGNOSIS — Z20828 Contact with and (suspected) exposure to other viral communicable diseases: Secondary | ICD-10-CM | POA: Diagnosis present

## 2019-02-02 DIAGNOSIS — R42 Dizziness and giddiness: Secondary | ICD-10-CM | POA: Diagnosis not present

## 2019-02-02 DIAGNOSIS — J80 Acute respiratory distress syndrome: Secondary | ICD-10-CM | POA: Diagnosis not present

## 2019-02-02 DIAGNOSIS — Z6841 Body Mass Index (BMI) 40.0 and over, adult: Secondary | ICD-10-CM | POA: Diagnosis not present

## 2019-02-02 DIAGNOSIS — R0902 Hypoxemia: Secondary | ICD-10-CM | POA: Diagnosis not present

## 2019-02-02 DIAGNOSIS — Z7951 Long term (current) use of inhaled steroids: Secondary | ICD-10-CM

## 2019-02-02 DIAGNOSIS — Z96652 Presence of left artificial knee joint: Secondary | ICD-10-CM | POA: Diagnosis present

## 2019-02-02 DIAGNOSIS — I10 Essential (primary) hypertension: Secondary | ICD-10-CM | POA: Diagnosis not present

## 2019-02-02 DIAGNOSIS — R072 Precordial pain: Secondary | ICD-10-CM

## 2019-02-02 DIAGNOSIS — E785 Hyperlipidemia, unspecified: Secondary | ICD-10-CM | POA: Diagnosis not present

## 2019-02-02 DIAGNOSIS — N182 Chronic kidney disease, stage 2 (mild): Secondary | ICD-10-CM | POA: Diagnosis present

## 2019-02-02 DIAGNOSIS — E1122 Type 2 diabetes mellitus with diabetic chronic kidney disease: Secondary | ICD-10-CM | POA: Diagnosis present

## 2019-02-02 DIAGNOSIS — R079 Chest pain, unspecified: Secondary | ICD-10-CM | POA: Diagnosis not present

## 2019-02-02 DIAGNOSIS — Z79899 Other long term (current) drug therapy: Secondary | ICD-10-CM

## 2019-02-02 DIAGNOSIS — I129 Hypertensive chronic kidney disease with stage 1 through stage 4 chronic kidney disease, or unspecified chronic kidney disease: Secondary | ICD-10-CM | POA: Diagnosis present

## 2019-02-02 DIAGNOSIS — Z87891 Personal history of nicotine dependence: Secondary | ICD-10-CM

## 2019-02-02 DIAGNOSIS — I712 Thoracic aortic aneurysm, without rupture: Secondary | ICD-10-CM | POA: Diagnosis present

## 2019-02-02 DIAGNOSIS — Z8546 Personal history of malignant neoplasm of prostate: Secondary | ICD-10-CM

## 2019-02-02 HISTORY — DX: Chronic kidney disease, stage 2 (mild): N18.2

## 2019-02-02 LAB — BASIC METABOLIC PANEL
Anion gap: 13 (ref 5–15)
BUN: 18 mg/dL (ref 8–23)
CO2: 22 mmol/L (ref 22–32)
Calcium: 9 mg/dL (ref 8.9–10.3)
Chloride: 102 mmol/L (ref 98–111)
Creatinine, Ser: 1.32 mg/dL — ABNORMAL HIGH (ref 0.61–1.24)
GFR calc Af Amer: >60 mL/min (ref >60)
GFR calc non Af Amer: 55 mL/min — ABNORMAL LOW (ref >60)
Glucose, Bld: 149 mg/dL — ABNORMAL HIGH (ref 70–99)
Potassium: 3.8 mmol/L (ref 3.5–5.1)
Sodium: 137 mmol/L (ref 135–145)

## 2019-02-02 LAB — TROPONIN I (HIGH SENSITIVITY): Troponin I (High Sensitivity): 5 ng/L (ref <18)

## 2019-02-02 LAB — CBC
HCT: 39.8 % (ref 39.0–52.0)
Hemoglobin: 13.7 g/dL (ref 13.0–17.0)
MCH: 30.3 pg (ref 26.0–34.0)
MCHC: 34.4 g/dL (ref 30.0–36.0)
MCV: 88.1 fL (ref 80.0–100.0)
Platelets: 168 10*3/uL (ref 150–400)
RBC: 4.52 MIL/uL (ref 4.22–5.81)
RDW: 13.3 % (ref 11.5–15.5)
WBC: 5.6 10*3/uL (ref 4.0–10.5)
nRBC: 0 % (ref 0.0–0.2)

## 2019-02-02 MED ORDER — MORPHINE SULFATE (PF) 2 MG/ML IV SOLN: 2.0000 mg | INTRAVENOUS | Status: DC | PRN

## 2019-02-02 MED ORDER — NITROGLYCERIN 0.4 MG SL SUBL: 0.4000 mg | SUBLINGUAL_TABLET | SUBLINGUAL | Status: DC | PRN

## 2019-02-02 MED ORDER — IOHEXOL 350 MG/ML SOLN
100.0000 mL | Freq: Once | INTRAVENOUS | Status: AC | PRN
  Administered 2019-02-02: 100 mL via INTRAVENOUS

## 2019-02-02 MED ORDER — SODIUM CHLORIDE 0.9% FLUSH
3.0000 mL | Freq: Once | INTRAVENOUS | Status: AC
  Administered 2019-02-02: 3 mL via INTRAVENOUS

## 2019-02-02 MED ORDER — CLOPIDOGREL BISULFATE 75 MG PO TABS
75.0000 mg | ORAL_TABLET | Freq: Once | ORAL | Status: AC
  Administered 2019-02-02: 75 mg via ORAL
  Filled 2019-02-02: qty 1

## 2019-02-02 MED ORDER — HYDRALAZINE HCL 20 MG/ML IJ SOLN: 5.0000 mg | INTRAMUSCULAR | Status: DC | PRN

## 2019-02-02 NOTE — ED Provider Notes (Addendum)
[facility] EMERGENCY DEPARTMENT Provider Note   CSN: [id number] Arrival date & time: 02/02/19  2056     History   Chief Complaint Chief Complaint  Patient presents with  . Chest Pain    HPI [name] is a 68 y.o. male.     Patient with hx htn, dm c/o mid chest tightness, new onset and intermittent over the course of the past 2-3 days. Symptoms acute onset, tightness/heaviness,  episodic, recurrent, worse w exertion, lasting several minutes. Mild sob w the chest discomfort. No nausea, vomiting or diaphoresis. Pain is intermittent and not pleuritic. No leg pain. No hx dvt or pe. +fam hx in parent and siblings at similar age to patient. Non smoker. Denies cough or uri symptoms. No fever or chills. No known covid+ exposure. No recent surgery, immobility or trauma. No heartburn.   The history is provided by the patient.  Chest Pain Associated symptoms: no abdominal pain, no back pain, no fever, no headache, no shortness of breath and no vomiting     Past Medical History:  Diagnosis Date  . Abnormal EKG 10/14/2015   Overview:  Poor R wave progression and abnormal T waves  . Acid reflux 03/16/2017  . 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 Aftercare following surgery 08/30/2015  . Allergy   . Arthritis HANDS AND LEFT KNEE  . 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 Cataract    forming bilaterally   . Diabetes ([place]) 03/16/2017  . Essential hypertension 10/14/2015  . GERD (gastroesophageal reflux disease)   . Hematuria   . Hemorrhoid   . History of kidney stones   . Hyperlipidemia   . Hypertension   . Kidney stone 03/16/2017  . Lumbar radiculopathy 03/16/2017  . OSA on CPAP   . Paronychia 08/16/2015  . Preoperative cardiovascular examination 08/19/2016  . Prostate cancer ([place]) 03/16/2017  . Right flank pain   . Right ureteral stone   . Skin cancer   . Sleep apnea   . Type 2 diabetes, diet controlled  ([place])    off Metformin > 1 yr now as of PV 02-23-18  . Ureteral calculus, right 03/15/2012  . Wears glasses     Patient Active Problem List   Diagnosis Date Noted  . Chest pain in adult 04/13/2018  . Acid reflux 03/16/2017  . Arthritis 03/16/2017  . Hyperlipidemia 03/16/2017  . Kidney stone 03/16/2017  . Lumbar radiculopathy 03/16/2017  . Prostate cancer ([place]) 03/16/2017  . Diabetes ([place]) 03/16/2017  . 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 Preoperative cardiovascular examination 08/19/2016  . Abnormal EKG 10/14/2015  . Essential hypertension 10/14/2015  . Aftercare following surgery 08/30/2015  . Paronychia 08/16/2015  . Ureteral calculus, right 03/15/2012  . 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 BACK SURGERY  2019   rod and screws   . BILATERAL INGUINAL HERNIA REPAIR  1989  . 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  Marland Kitchen CARPAL TUNNEL RELEASE Left 06/2016  . CARPAL TUNNEL RELEASE Right 03/2017  . COLONOSCOPY     last with [facility] unsure when   . CYSTOSCOPY WITH URETEROSCOPY AND STENT PLACEMENT Right 10/28/2013   Procedure: RIGHT URETEROSCOPY AND STENT PLACEMENT;  Surgeon: [name], MD;  Location: [facility];  Service: Urology;  Laterality: Right;  .  EXTRACORPOREAL SHOCK WAVE LITHOTRIPSY  02-08-2009  . HOLMIUM LASER APPLICATION Right [id number]   Procedure: HOLMIUM LASER LITHO;  Surgeon: [name], MD;  Location: [facility];  Service: Urology;  Laterality: Right;  . LEFT KNEE [place]  NOV 2011  . LEFT URETEROSCOPIC STONE EXTRACTION  02-23-2009  . POLYPECTOMY  2014   1 polyp per dr Steve Rattler notes- no surg path to determine polyps type but was a 5 yr recall   . RADIOACTIVE SEED IMPLANT  08/08/2011   Procedure: RADIOACTIVE SEED IMPLANT;  Surgeon: [name], MD;  Location: [facility];  Service: Urology;  Laterality: N/A;  seeds implanted 50 seeds found in bladder=none   . REPLACEMENT TOTAL KNEE Left 2018  . SKIN CANCER EXCISION     Burned off left side of nose, and head  . UPPER GASTROINTESTINAL ENDOSCOPY    . URETEROSCOPY  03/15/2012   Procedure: URETEROSCOPY;  Surgeon: [name], MD;  Location: [facility];  Service: Urology;  Laterality: Right;  Right Ueteroscopy         Home Medications    Prior to Admission medications   Medication Sig Start Date End Date Taking? Authorizing Provider  atorvastatin (LIPITOR) 10 MG tablet Take 10 mg by mouth daily.    [provider]  cholecalciferol (VITAMIN D3) 25 MCG (1000 UT) tablet Take 1,000 Units by mouth daily.    [provider]  fexofenadine (ALLEGRA) 180 MG tablet Take 180 mg by mouth daily.    [provider]  fluticasone (FLONASE) 50 MCG/ACT nasal spray Place 2 sprays into the nose daily as needed.     [provider]  lisinopril (PRINIVIL,ZESTRIL) 10 MG tablet Take 10 mg by mouth every morning.     [provider]  Melatonin 5 MG CAPS Take 1 capsule by mouth at bedtime.     [provider]  Multiple Vitamin (MULTI-VITAMINS) TABS Take by mouth.    [provider]  Omega 3 1200 MG CAPS Take 4 capsules by mouth daily.    [provider]  omeprazole (PRILOSEC) 10 MG capsule Take 10 mg daily by mouth.    [provider]  ranitidine (ZANTAC) 300 MG tablet Take 300 mg by mouth at bedtime.    [provider]  traZODone (DESYREL) 100 MG tablet Take 100 mg at bedtime by mouth.    [provider]    Family History Family History  Problem Relation Age of Onset  . Cancer Mother   . Diabetes Father   . CAD Father   . Heart attack Father   . Diabetes Sister   . Colon polyps Sister   . Colon polyps Brother   . Colon cancer Neg Hx   . Esophageal cancer  Neg Hx   . Rectal cancer Neg Hx   . Stomach cancer Neg Hx     Social History Social History   Tobacco Use  . Smoking status: Former Smoker    Years: 10.00    Types: Cigarettes    Quit date: 07/02/1993    Years since quitting: 25.6  . Smokeless tobacco: Former Systems developer    Types: Chew  Substance Use Topics  . Alcohol use: No  . Drug use: No     Allergies   Aspirin and Amoxicillin   Review of Systems Review of Systems  Constitutional: Negative for fever.  HENT: Negative for sore throat.   Eyes: Negative for  redness.  Respiratory: Negative for shortness of breath.   Cardiovascular: Positive for chest pain.  Gastrointestinal: Negative for abdominal pain and vomiting.  Genitourinary: Negative for flank pain.  Musculoskeletal: Negative for back pain and neck pain.  Skin: Negative for rash.  Neurological: Negative for headaches.  Hematological: Does not bruise/bleed easily.  Psychiatric/Behavioral: Negative for confusion.     Physical Exam Updated Vital Signs BP (!) 112/98   Pulse 69   Temp 98.2 F (36.8 C) (Oral)   Resp 19   Wt 112.5 kg   SpO2 95%   BMI 40.03 kg/m   Physical Exam Vitals signs and nursing note reviewed.  Constitutional:      Appearance: Normal appearance. He is well-developed.  HENT:     Head: Atraumatic.     Nose: Nose normal.     Mouth/Throat:     Mouth: Mucous membranes are moist.     Pharynx: Oropharynx is clear.  Eyes:     General: No scleral icterus.    Conjunctiva/sclera: Conjunctivae normal.     Pupils: Pupils are equal, round, and reactive to light.  Neck:     Musculoskeletal: Normal range of motion and neck supple. No neck rigidity.     Trachea: No tracheal deviation.  Cardiovascular:     Rate and Rhythm: Normal rate and regular rhythm.     Pulses: Normal pulses.     Heart sounds: Normal heart sounds. No murmur. No friction rub. No gallop.   Pulmonary:     Effort: Pulmonary effort is normal. No accessory muscle usage or  respiratory distress.     Breath sounds: Normal breath sounds.  Chest:     Chest wall: No tenderness.  Abdominal:     General: Bowel sounds are normal. There is no distension.     Palpations: Abdomen is soft.     Tenderness: There is no abdominal tenderness. There is no guarding.     Comments: obese  Genitourinary:    Comments: No cva tenderness. Musculoskeletal:     Comments: Symmetric bilateral lower leg/ankle edema, moderate. No calf pain/tenderness.   Skin:    General: Skin is warm and dry.     Findings: No rash.  Neurological:     Mental Status: He is alert.     Comments: Alert, speech clear.   Psychiatric:        Mood and Affect: Mood normal.      ED Treatments / Results  Labs (all labs ordered are listed, but only abnormal results are displayed) Results for orders placed or performed during the hospital encounter of [id number]  Basic metabolic panel  Result Value Ref Range   Sodium 137 135 - 145 mmol/L   Potassium 3.8 3.5 - 5.1 mmol/L   Chloride 102 98 - 111 mmol/L   CO2 22 22 - 32 mmol/L   Glucose, Bld 149 (H) 70 - 99 mg/dL   BUN 18 8 - 23 mg/dL   Creatinine, Ser 1.32 (H) 0.61 - 1.24 mg/dL   Calcium 9.0 8.9 - 10.3 mg/dL   GFR calc non Af Amer 55 (L) >60 mL/min   GFR calc Af Amer >60 >60 mL/min   Anion gap 13 5 - 15  CBC  Result Value Ref Range   WBC 5.6 4.0 - 10.5 K/uL   RBC 4.52 4.22 - 5.81 MIL/uL   Hemoglobin 13.7 13.0 - 17.0 g/dL   HCT 39.8 39.0 - 52.0 %   MCV 88.1 80.0 - 100.0 fL   MCH  30.3 26.0 - 34.0 pg   MCHC 34.4 30.0 - 36.0 g/dL   RDW 13.3 11.5 - 15.5 %   Platelets 168 150 - 400 K/uL   nRBC 0.0 0.0 - 0.2 %  Troponin I (High Sensitivity)  Result Value Ref Range   Troponin I (High Sensitivity) 5 <18 ng/L   Dg Chest Portable 1 View  Result Date: 02/02/2019 CLINICAL DATA:  Chest pain. EXAM: PORTABLE CHEST 1 VIEW COMPARISON:  Chest radiograph 06/23/2018, CT 04/19/2018 FINDINGS: The cardiomediastinal contours are unchanged with mild cardiomegaly.  The lungs are clear. Pulmonary vasculature is normal. No consolidation, pleural effusion, or pneumothorax. No acute osseous abnormalities are seen. IMPRESSION: Stable mild cardiomegaly without acute abnormality. Electronically Signed   By: Keith Rake M.D.   On: 02/02/2019 21:40    EKG EKG Interpretation  Date/Time:  Wednesday February 02 2019 21:08:26 EDT Ventricular Rate:  76 PR Interval:    QRS Duration: 106 QT Interval:  377 QTC Calculation: 424 R Axis:   37 Text Interpretation:  Sinus rhythm No significant change since last tracing Confirmed by Lajean Saver [phone]) on 02/02/2019 9:22:05 PM   Radiology Dg Chest Portable 1 View  Result Date: 02/02/2019 CLINICAL DATA:  Chest pain. EXAM: PORTABLE CHEST 1 VIEW COMPARISON:  Chest radiograph 06/23/2018, CT 04/19/2018 FINDINGS: The cardiomediastinal contours are unchanged with mild cardiomegaly. The lungs are clear. Pulmonary vasculature is normal. No consolidation, pleural effusion, or pneumothorax. No acute osseous abnormalities are seen. IMPRESSION: Stable mild cardiomegaly without acute abnormality. Electronically Signed   By: Keith Rake M.D.   On: 02/02/2019 21:40    Procedures Procedures (including critical care time)  Medications Ordered in ED Medications  sodium chloride flush (NS) 0.9 % injection 3 mL (3 mLs Intravenous Given 02/02/19 2125)     Initial Impression / Assessment and Plan / ED Course  I have reviewed the triage vital signs and the nursing notes.  Pertinent labs & imaging results that were available during my care of the patient were reviewed by me and considered in my medical decision making (see chart for details).  Iv ns. Continuous pulse ox and monitor. Stat labs. Ecg. Cxr.   Patient indicates is allergic to asa - severe swelling. plavix po.   Reviewed nursing notes and prior charts for additional history.   Labs reviewed by me - initial trop is normal.  cxr reviewed by me - no pna.    Patient with multiple risk factors for cad, elevated heart score 6, symptoms concerning for new onset angina - will consult medical service for admission, and further cardiac workup.  Recheck pt - no chest pain or discomfort.   Hospitalist consulted for admission - will admit.   Final Clinical Impressions(s) / ED Diagnoses   Final diagnoses:  None    ED Discharge Orders    None          Lajean Saver, MD 02/02/19 2320

## 2019-02-02 NOTE — ED Notes (Signed)
Patient transported to CT 

## 2019-02-02 NOTE — ED Triage Notes (Signed)
Pt reports shob 3x days with shortness of breath today. Pt describes chest pain as pressure. 134/76, rr16, hr 86, o2 95% ra, cbg 171.

## 2019-02-02 NOTE — H&P (Addendum)
History and Physical    Joseph Pham [id number] DOB: 1951/03/03 DOA: 02/02/2019  Referring MD/NP/PA:   PCP: Angelina Sheriff, MD   Patient coming from:  The patient is coming from home.  At baseline, pt is independent for most of ADL.        Chief Complaint: chest pain  HPI: Joseph Pham is a 68 y.o. 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  Patient states that she has been having intermittent chest pain the past 3 days.  The chest pain is located in the left lower chest, pressure-like, nonradiating.  It is associated with some shortness breath.  Patient has mild dry cough, but no fever or chills.  No recent long distant traveling.  No tenderness in the calf areas.  Patient denies nausea vomiting, diarrhea, abdominal pain, symptoms of UTI or unilateral weakness.  ED Course: pt was found to have troponin V, WBC 5.6, pending COVID-19 test, slightly worsening renal function, temperature normal, blood pressure 112/98, oxygen saturation 95% on room air, heart rate 69.  Patient is placed on telemetry bed for observation.  Review of Systems:   General: no fevers, chills, no body weight gain, has fatigue HEENT: no blurry vision, hearing changes or sore throat Respiratory: has dyspnea, coughing, no wheezing CV: has chest pain, no palpitations GI: no nausea, vomiting, abdominal pain, diarrhea, constipation GU: no dysuria, burning on urination, increased urinary frequency, hematuria  Ext: no leg edema Neuro: no unilateral weakness, numbness, or tingling, no vision change or hearing loss Skin: no rash, no skin tear. MSK: No muscle spasm, no deformity, no limitation of range of movement in spin Heme: No easy bruising.  Travel history: No recent long distant  travel.  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/2016   Overview:  43 mm ascending, 11/22/15   Cataract    forming bilaterally    Diabetes ([place]) 03/16/2017   Essential hypertension 10/14/2015   GERD (gastroesophageal reflux disease)    Hematuria    Hemorrhoid    History of kidney stones    Hyperlipidemia    Hypertension    Kidney stone 03/16/2017   Lumbar radiculopathy 03/16/2017   OSA on CPAP    Paronychia 08/16/2015   Preoperative cardiovascular examination 08/19/2016   Prostate cancer ([place]) 03/16/2017   Right flank pain    Right ureteral stone    Skin cancer    Sleep apnea    Type 2 diabetes, diet controlled ([place])    off Metformin > 1 yr now as of PV 02-23-18   Ureteral calculus, right 03/15/2012   Wears glasses     Past Surgical History:  Procedure Laterality Date   BACK SURGERY  2019   rod and screws    BILATERAL INGUINAL HERNIA REPAIR  1989   CARDIAC CATHETERIZATION  01-11-2004   NO EVIDENCE SIGNIFICANT EPICARDIAL FLOW LIMITING 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   CARPAL  TUNNEL RELEASE Left 06/2016   CARPAL TUNNEL RELEASE Right 03/2017   COLONOSCOPY     last with medoff unsure when    CYSTOSCOPY WITH URETEROSCOPY AND STENT PLACEMENT Right 10/28/2013   Procedure: RIGHT URETEROSCOPY AND STENT PLACEMENT;  Surgeon: [name], MD;  Location: [facility];  Service: Urology;  Laterality: Right;   EXTRACORPOREAL SHOCK WAVE LITHOTRIPSY  02-08-2009   HOLMIUM LASER APPLICATION Right [id number]   Procedure: HOLMIUM LASER LITHO;   Surgeon: [name], MD;  Location: [facility];  Service: Urology;  Laterality: Right;   LEFT KNEE [place]   LEFT URETEROSCOPIC STONE EXTRACTION  02-23-2009   POLYPECTOMY  2014   1 polyp per dr Steve Rattler notes- no surg path to determine polyps type but was a 5 yr recall    RADIOACTIVE SEED IMPLANT  08/08/2011   Procedure: RADIOACTIVE SEED IMPLANT;  Surgeon: [name], MD;  Location: [facility];  Service: Urology;  Laterality: N/A;  seeds implanted 50 seeds found in bladder=none    REPLACEMENT TOTAL KNEE Left 2018   SKIN CANCER EXCISION     Burned off left side of nose, and head   UPPER GASTROINTESTINAL ENDOSCOPY     URETEROSCOPY  03/15/2012   Procedure: URETEROSCOPY;  Surgeon: [name], MD;  [facility];  Service: Urology;  Laterality: Right;  Right Ueteroscopy     Social History:  reports that he quit smoking about 25 years ago. His smoking use included cigarettes. He quit after 10.00 years of use. He has quit using smokeless tobacco.  His smokeless tobacco use included chew. He reports that he does not drink alcohol or use drugs.  Family History:  Family History  Problem Relation Age of Onset   Cancer Mother    Diabetes Father    CAD Father    Heart attack Father    Diabetes Sister    Colon polyps Sister    Colon polyps Brother    Colon cancer Neg Hx    Esophageal cancer Neg Hx    Rectal cancer Neg Hx    Stomach cancer Neg Hx      Prior to Admission medications   Medication Sig Start Date End Date Taking? Authorizing Provider  atorvastatin (LIPITOR) 10 MG tablet Take 10 mg by mouth daily.    [provider]  cholecalciferol (VITAMIN D3) 25 MCG (1000 UT) tablet Take 1,000 Units by mouth daily.    [provider]  fexofenadine (ALLEGRA) 180 MG tablet Take 180 mg by mouth daily.    [provider]  fluticasone (FLONASE) 50 MCG/ACT nasal spray Place  2 sprays into the nose daily as needed.     [provider]  lisinopril (PRINIVIL,ZESTRIL) 10 MG tablet Take 10 mg by mouth every morning.     [provider]  Melatonin 5 MG CAPS Take 1 capsule by mouth at bedtime.     [provider]  Multiple Vitamin (MULTI-VITAMINS) TABS Take by mouth.    [provider]  Omega 3 1200 MG CAPS Take 4 capsules by mouth daily.    [provider]  omeprazole (PRILOSEC) 10 MG capsule Take 10 mg daily by mouth.    [provider]  ranitidine (ZANTAC) 300 MG tablet Take 300 mg by mouth at bedtime.    [provider]  traZODone (DESYREL) 100 MG tablet Take 100 mg at bedtime by mouth.  [provider]    Physical Exam: Vitals:   02/02/19 2245 02/02/19 2300 02/02/19 2315 02/02/19 2330  BP:  126/71  133/75  Pulse: 64 63 62 (!) 59  Resp: 13 19 16 16   Temp:      TempSrc:      SpO2: 95% 96% 97% 97%  Weight:       General: Not in acute distress HEENT:       Eyes: PERRL, EOMI, no scleral icterus.       ENT: No discharge from the ears and nose, no pharynx injection, no tonsillar enlargement.        Neck: No JVD, no bruit, no mass felt. Heme: No neck lymph node enlargement. Cardiac: S1/S2, RRR, No murmurs, No gallops or rubs. Respiratory: No rales, wheezing, rhonchi or rubs. GI: Soft, nondistended, nontender, no rebound pain, no organomegaly, BS present. GU: No hematuria Ext: No pitting leg edema bilaterally. 2+DP/PT pulse bilaterally. Musculoskeletal: No joint deformities, No joint redness or warmth, no limitation of ROM in spin. Skin: No rashes.  Neuro: Alert, oriented X3, cranial nerves II-XII grossly intact, moves all extremities normally. Psych: Patient is not psychotic, no suicidal or hemocidal ideation.  Labs on Admission: I have personally reviewed following labs and imaging studies  CBC: Recent Labs  Lab 02/02/19 2123  WBC 5.6  HGB 13.7  HCT 39.8  MCV 88.1  PLT [id number]    Basic Metabolic Panel: Recent Labs  Lab 02/02/19 2123  NA 137  K 3.8  CL 102  CO2 22  GLUCOSE 149*  BUN 18  CREATININE 1.32*  CALCIUM 9.0   GFR: CrCl cannot be calculated (Unknown ideal weight.). Liver Function Tests: No results for input(s): AST, ALT, ALKPHOS, BILITOT, PROT, ALBUMIN in the last 168 hours. No results for input(s): LIPASE, AMYLASE in the last 168 hours. No results for input(s): AMMONIA in the last 168 hours. Coagulation Profile: No results for input(s): INR, PROTIME in the last 168 hours. Cardiac Enzymes: No results for input(s): CKTOTAL, CKMB, CKMBINDEX, TROPONINI in the last 168 hours. BNP (last 3 results) No results for input(s): PROBNP in the last 8760 hours. HbA1C: No results for input(s): HGBA1C in the last 72 hours. CBG: No results for input(s): GLUCAP in the last 168 hours. Lipid Profile: No results for input(s): CHOL, HDL, LDLCALC, TRIG, CHOLHDL, LDLDIRECT in the last 72 hours. Thyroid Function Tests: No results for input(s): TSH, T4TOTAL, FREET4, T3FREE, THYROIDAB in the last 72 hours. Anemia Panel: No results for input(s): VITAMINB12, FOLATE, FERRITIN, TIBC, IRON, RETICCTPCT in the last 72 hours. Urine analysis:    Component Value Date/Time   COLORURINE YELLOW 02/07/2009 0800   APPEARANCEUR CLEAR 02/07/2009 0800   LABSPEC 1.018 02/07/2009 0800   PHURINE 5.5 02/07/2009 0800   GLUCOSEU NEGATIVE 02/07/2009 0800   HGBUR LARGE (A) 02/07/2009 0800   BILIRUBINUR NEGATIVE 02/07/2009 0800   KETONESUR NEGATIVE 02/07/2009 0800   PROTEINUR NEGATIVE 02/07/2009 0800   UROBILINOGEN 0.2 02/07/2009 0800   NITRITE NEGATIVE 02/07/2009 0800   LEUKOCYTESUR NEGATIVE 02/07/2009 0800   Sepsis Labs: @LABRCNTIP (procalcitonin:4,lacticidven:4) )No results found for this or any previous visit (from the past 240 hour(s)).   Radiological Exams on Admission: Dg Chest Portable 1 View  Result Date: 02/02/2019 CLINICAL DATA:  Chest pain. EXAM: PORTABLE CHEST 1  VIEW COMPARISON:  Chest radiograph 06/23/2018, CT 04/19/2018 FINDINGS: The cardiomediastinal contours are unchanged with mild cardiomegaly. The lungs are clear. Pulmonary vasculature is normal. No consolidation, pleural effusion, or pneumothorax. No acute osseous abnormalities  are seen. IMPRESSION: Stable mild cardiomegaly without acute abnormality. Electronically Signed   By: Keith Rake M.D.   On: 02/02/2019 21:40   Ct Angio Chest/abd/pel For Dissection W And/or W/wo  Addendum Date: 02/03/2019   ADDENDUM REPORT: 02/03/2019 00:34 ADDENDUM: These results were called by telephone on 02/03/2019 at 12:34 am to Dr. Ivor Costa , who verbally acknowledged these results. Electronically Signed   By: Lovena Le M.D.   On: 02/03/2019 00:34   Result Date: 02/03/2019 CLINICAL DATA:  Chest pain, acute aortic symptom suspected, hemodynamically stable EXAM: CT ANGIOGRAPHY CHEST, ABDOMEN AND PELVIS TECHNIQUE: Multidetector CT imaging through the chest, abdomen and pelvis was performed using the standard protocol during bolus administration of intravenous contrast. Multiplanar reconstructed images and MIPs were obtained and reviewed to evaluate the vascular anatomy. CONTRAST:  116mL OMNIPAQUE IOHEXOL 350 MG/ML SOLN COMPARISON:  CT chest 04/19/2018 FINDINGS: CTA CHEST FINDINGS Cardiovascular: Noncontrast CT scan of the chest reveals no abnormal mural hyper attenuation or plaque displacement to suggest intramural hematoma. Postcontrast CT images demonstrate preferential opacification of the thoracic aorta The aortic root is suboptimally assessed given cardiac pulsation artifact. Dilation of the ascending thoracic aorta up to 4.2 cm is not significantly changed in caliber from comparison CT dated 04/19/2018. There is reservation of a normal caliber by the level of the normally branching aortic arch with vessel caliber measuring 2.6 cm just distal to the left subclavian origin and the descending thoracic aorta measuring 2.4 cm.  No intramural hematoma, dissection flap or other luminal abnormality of the aorta is seen. No periaortic stranding or hemorrhage. Non tailored examination of the pulmonary arteries reveals few linear filling defects in the segmental pulmonary arteries of the posterior basal and anterior basal segments of the right lower lobe as well as the anterior segments of the right upper lobe. No significant increase in the RV-LV ratio (0.84). Normal heart size. No pericardial effusion. Atherosclerotic calcification of the coronary arteries. Few calcifications are present on the aortic leaflets. Punctate focus of gas in left brachiocephalic vein likely related to intravenous access. Mediastinum/Nodes: No enlarged mediastinal or axillary lymph nodes. Thyroid gland, trachea, and esophagus demonstrate no significant findings. Lungs/Pleura: No consolidation, features of edema, pneumothorax, or effusion. No suspicious pulmonary nodules or masses. Small amount of subpleural fat in left lung base Musculoskeletal: Multilevel degenerative changes are present in the imaged portions of the spine. No suspicious chest wall lesions. Mild bilateral gynecomastia. Review of the MIP images confirms the above findings. CTA ABDOMEN AND PELVIS FINDINGS VASCULAR Aorta: Atheromatous plaque seen throughout the abdominal aorta. No aneurysm or ectasia. No dissection, features of vasculitis or significant stenosis. Celiac: Patent without evidence of aneurysm, dissection, vasculitis or significant stenosis. SMA: Patent without evidence of aneurysm, dissection, vasculitis or significant stenosis. Renals: Single renal arteries bilaterally. Ostial plaque at the left renal artery results in at least mild stenosis. Right renal ostia is widely patent. No evidence of aneurysm, dissection or vasculitis. No features of fibromuscular dysplasia. IMA: Patent without evidence of aneurysm, dissection, vasculitis or significant stenosis. Inflow: Atheromatous plaque  throughout the inflows most pronounced in the common iliac arteries. No significant stenosis. No evidence of aneurysm, dissection or vasculitis. Veins: No obvious venous abnormality within the limitations of this arterial phase study. Review of the MIP images confirms the above findings. NON-VASCULAR Hepatobiliary: No focal liver abnormality is seen. No gallstones, gallbladder wall thickening, or biliary dilatation. Pancreas: Unremarkable. No pancreatic ductal dilatation or surrounding inflammatory changes. Spleen: Patchy enhancement the spleen, expected for  the arterial phase. No splenic abnormality. Normal size. Adrenals/Urinary Tract: Normal adrenal glands. Multiple nonobstructing calculi are present within the left kidney, largest in the upper pole measures 15 mm in size. No right urolithiasis. No obstructing calculi. No hydronephrosis. No concerning renal masses. Mild bilateral nonspecific perinephric stranding, a nonspecific finding though may correlate with either age or decreased renal function. Urinary bladder is unremarkable. Stomach/Bowel: Distal esophagus, stomach and duodenal sweep are unremarkable. No bowel wall thickening or dilatation. No evidence of obstruction. A normal appendix is visualized. Scattered colonic diverticula without focal pericolonic inflammation to suggest diverticulitis. Lymphatic: No suspicious or enlarged lymph nodes in the included lymphatic chains. Reproductive: Multiple prostatic brachytherapy implants are noted. Seminal vesicles are unremarkable. Other: No abdominopelvic free fluid or free gas. No bowel containing hernias. Small fat containing umbilical hernia Musculoskeletal: Prior L4-5 posterior spinal fusion and interbody spacer placement. 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 Review of the MIP images confirms the above findings. IMPRESSION: 1. No evidence of acute aortic syndrome. 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 Recommend semi-annual imaging followup by CTA or MRA and referral to cardiothoracic surgery if not already obtained. This recommendation follows 2010 ACCF/AHA/AATS/ACR/ASA/SCA/SCAI/SIR/STS/SVM Guidelines for the Diagnosis and Management of Patients With Thoracic Aortic Disease. Circulation. 2010; 121GL:6099015. Aortic aneurysm NOS (ICD10-I71.9) 2. Filling defects in the segmental pulmonary arteries of the right lower lobe and right upper lobe, consistent pulmonary embolus without CT evidence of right heart strain. 3. No acute intra-abdominal process. 4. Nonobstructing left nephrolithiasis. 5. Prostatic brachytherapy seeds are noted. 6. Colonic diverticulosis without evidence of diverticulitis. 7. Aortic Atherosclerosis (ICD10-I70.0). Mild ostial narrowing of the left renal artery. Currently attempting to contact the ordering provider with critical results. Addendum will be placed upon discussion with the patient's care team. Electronically Signed: By: Lovena Le M.D. On: 02/03/2019 00:29     EKG: Independently reviewed.  Sinus rhythm, QTC 424, low voltage, poor R wave progression, nonspecific T wave change.   Assessment/Plan Principal Problem:   PE (pulmonary thromboembolism) (HCC) Active Problems:   Ascending aorta enlargement (HCC)   Essential hypertension   Hyperlipidemia   CKD (chronic kidney disease), stage II   GERD (gastroesophageal reflux disease)   OSA (obstructive sleep apnea)   Chest pain   Chest pain: Patient has chest pain similar to be due to angina.  He has history of ascending aortic dilation, will need to rule out dissection.  Initial troponin 5.  - will place on Tele bed for obs - Trend Trop - Repeat EKG in the am  - prn Nitroglycerin, Morphine, and lipitor  - pt is allergic to ASA -->received 75 mg of plavix in ED - Risk factor stratification: will check FLP and A1C  - check UDS - f/u CAT to r/o dissection  Addendum: CTA showed stable  aneurysmal dilatation of the ascending thoracic aorta up to 4.2 cm, but showed filling defects in the segmental pulmonary arteries of the right lower lobe and right upper lobe, consistent pulmonary embolus, without CT evidence of right heart strain. -will start IV heparin -will get LE doppler and 2d echo -prn percocet -prn albuterol   Ascending aorta enlargement (HCC): -f/u CTA  Essential hypertension: Bp normal -IV Hydralazine prn -hold lisinopril due to slightly worsening renal function and normal Bp  Hyperlipidemia: -Lipitor  CKD (chronic kidney disease), stage II: Slightly worsening.  Baseline creatinine 1.12 on 09/09/2017.  His creatinine is 1.32, BUN 18. -  Hold lisinopril -IV fluid: Normal saline 75 cc/h  GERD (gastroesophageal reflux disease): -pepcid  OSA (obstructive sleep apnea): -CPAP   DVT ppx: On IV Heparin  Code Status: Full code Family Communication: None at bed side.   Disposition Plan:  Anticipate discharge back to previous home environment Consults called:  none Admission status: Obs / tele    Date of Service 02/03/2019    [facility]   If 7PM-7AM, please contact night-coverage [email or web address] Password [email or web address] 02/03/2019, 12:41 AM

## 2019-02-03 ENCOUNTER — Encounter (HOSPITAL_COMMUNITY): Payer: Self-pay | Admitting: General Practice

## 2019-02-03 ENCOUNTER — Observation Stay (HOSPITAL_COMMUNITY): Payer: Medicare Other

## 2019-02-03 ENCOUNTER — Observation Stay (HOSPITAL_BASED_OUTPATIENT_CLINIC_OR_DEPARTMENT_OTHER): Payer: Medicare Other

## 2019-02-03 ENCOUNTER — Other Ambulatory Visit: Payer: Self-pay

## 2019-02-03 DIAGNOSIS — N182 Chronic kidney disease, stage 2 (mild): Secondary | ICD-10-CM | POA: Diagnosis present

## 2019-02-03 DIAGNOSIS — K5790 Diverticulosis of intestine, part unspecified, without perforation or abscess without bleeding: Secondary | ICD-10-CM | POA: Diagnosis not present

## 2019-02-03 DIAGNOSIS — I701 Atherosclerosis of renal artery: Secondary | ICD-10-CM | POA: Diagnosis not present

## 2019-02-03 DIAGNOSIS — E1122 Type 2 diabetes mellitus with diabetic chronic kidney disease: Secondary | ICD-10-CM | POA: Diagnosis present

## 2019-02-03 DIAGNOSIS — Z20828 Contact with and (suspected) exposure to other viral communicable diseases: Secondary | ICD-10-CM | POA: Diagnosis present

## 2019-02-03 DIAGNOSIS — E785 Hyperlipidemia, unspecified: Secondary | ICD-10-CM | POA: Diagnosis present

## 2019-02-03 DIAGNOSIS — N200 Calculus of kidney: Secondary | ICD-10-CM | POA: Diagnosis not present

## 2019-02-03 DIAGNOSIS — I2699 Other pulmonary embolism without acute cor pulmonale: Secondary | ICD-10-CM

## 2019-02-03 DIAGNOSIS — Z87891 Personal history of nicotine dependence: Secondary | ICD-10-CM | POA: Diagnosis not present

## 2019-02-03 DIAGNOSIS — Z96652 Presence of left artificial knee joint: Secondary | ICD-10-CM | POA: Diagnosis present

## 2019-02-03 DIAGNOSIS — Z8546 Personal history of malignant neoplasm of prostate: Secondary | ICD-10-CM | POA: Diagnosis not present

## 2019-02-03 DIAGNOSIS — G4733 Obstructive sleep apnea (adult) (pediatric): Secondary | ICD-10-CM | POA: Diagnosis present

## 2019-02-03 DIAGNOSIS — I129 Hypertensive chronic kidney disease with stage 1 through stage 4 chronic kidney disease, or unspecified chronic kidney disease: Secondary | ICD-10-CM | POA: Diagnosis present

## 2019-02-03 DIAGNOSIS — N179 Acute kidney failure, unspecified: Secondary | ICD-10-CM | POA: Diagnosis present

## 2019-02-03 DIAGNOSIS — Z6841 Body Mass Index (BMI) 40.0 and over, adult: Secondary | ICD-10-CM | POA: Diagnosis not present

## 2019-02-03 DIAGNOSIS — I712 Thoracic aortic aneurysm, without rupture: Secondary | ICD-10-CM | POA: Diagnosis not present

## 2019-02-03 DIAGNOSIS — K219 Gastro-esophageal reflux disease without esophagitis: Secondary | ICD-10-CM | POA: Diagnosis present

## 2019-02-03 DIAGNOSIS — I351 Nonrheumatic aortic (valve) insufficiency: Secondary | ICD-10-CM

## 2019-02-03 DIAGNOSIS — I340 Nonrheumatic mitral (valve) insufficiency: Secondary | ICD-10-CM

## 2019-02-03 DIAGNOSIS — Z79899 Other long term (current) drug therapy: Secondary | ICD-10-CM | POA: Diagnosis not present

## 2019-02-03 DIAGNOSIS — R072 Precordial pain: Secondary | ICD-10-CM | POA: Diagnosis not present

## 2019-02-03 DIAGNOSIS — Z7951 Long term (current) use of inhaled steroids: Secondary | ICD-10-CM | POA: Diagnosis not present

## 2019-02-03 DIAGNOSIS — Z833 Family history of diabetes mellitus: Secondary | ICD-10-CM | POA: Diagnosis not present

## 2019-02-03 LAB — ECHOCARDIOGRAM COMPLETE
Height: 66 in
Weight: 3968.28 oz

## 2019-02-03 LAB — LIPID PANEL
Cholesterol: 153 mg/dL (ref 0–200)
HDL: 35 mg/dL — ABNORMAL LOW (ref >40)
LDL Cholesterol: 71 mg/dL (ref 0–99)
Total CHOL/HDL Ratio: 4.4 RATIO
Triglycerides: 235 mg/dL — ABNORMAL HIGH (ref <150)
VLDL: 47 mg/dL — ABNORMAL HIGH (ref 0–40)

## 2019-02-03 LAB — CBC
HCT: 40.2 % (ref 39.0–52.0)
Hemoglobin: 13.6 g/dL (ref 13.0–17.0)
MCH: 30.3 pg (ref 26.0–34.0)
MCHC: 33.8 g/dL (ref 30.0–36.0)
MCV: 89.5 fL (ref 80.0–100.0)
Platelets: 152 10*3/uL (ref 150–400)
RBC: 4.49 MIL/uL (ref 4.22–5.81)
RDW: 13.4 % (ref 11.5–15.5)
WBC: 5.3 10*3/uL (ref 4.0–10.5)
nRBC: 0 % (ref 0.0–0.2)

## 2019-02-03 LAB — HEMOGLOBIN A1C
Hgb A1c MFr Bld: 6.1 % — ABNORMAL HIGH (ref 4.8–5.6)
Mean Plasma Glucose: 128.37 mg/dL

## 2019-02-03 LAB — GLUCOSE, CAPILLARY: Glucose-Capillary: 127 mg/dL — ABNORMAL HIGH (ref 70–99)

## 2019-02-03 LAB — TROPONIN I (HIGH SENSITIVITY)
Troponin I (High Sensitivity): 4 ng/L (ref <18)
Troponin I (High Sensitivity): 5 ng/L (ref <18)
Troponin I (High Sensitivity): 5 ng/L (ref <18)

## 2019-02-03 LAB — RAPID URINE DRUG SCREEN, HOSP PERFORMED
Amphetamines: NOT DETECTED
Barbiturates: NOT DETECTED
Benzodiazepines: NOT DETECTED
Cocaine: NOT DETECTED
Opiates: NOT DETECTED
Tetrahydrocannabinol: NOT DETECTED

## 2019-02-03 LAB — HEPARIN LEVEL (UNFRACTIONATED): Heparin Unfractionated: 0.44 IU/mL (ref 0.30–0.70)

## 2019-02-03 LAB — SARS CORONAVIRUS 2 (TAT 6-24 HRS): SARS Coronavirus 2: NEGATIVE

## 2019-02-03 MED ORDER — LORATADINE 10 MG PO TABS
10.0000 mg | ORAL_TABLET | Freq: Every day | ORAL | Status: DC
  Administered 2019-02-03 – 2019-02-04 (×2): 10 mg via ORAL
  Filled 2019-02-03 (×2): qty 1

## 2019-02-03 MED ORDER — VITAMIN D 25 MCG (1000 UNIT) PO TABS
1000.0000 [IU] | ORAL_TABLET | Freq: Every day | ORAL | Status: DC
  Administered 2019-02-03 – 2019-02-04 (×2): 1000 [IU] via ORAL
  Filled 2019-02-03 (×3): qty 1

## 2019-02-03 MED ORDER — OMEGA 3 1200 MG PO CAPS: 4.0000 | ORAL_CAPSULE | Freq: Every day | ORAL | Status: DC

## 2019-02-03 MED ORDER — DM-GUAIFENESIN ER 30-600 MG PO TB12: 1.0000 | ORAL_TABLET | Freq: Two times a day (BID) | ORAL | Status: DC | PRN

## 2019-02-03 MED ORDER — MELATONIN 3 MG PO TABS
6.0000 mg | ORAL_TABLET | Freq: Every day | ORAL | Status: DC
  Administered 2019-02-03 (×2): 6 mg via ORAL
  Filled 2019-02-03 (×2): qty 2

## 2019-02-03 MED ORDER — HEPARIN SODIUM (PORCINE) 5000 UNIT/ML IJ SOLN
5000.0000 [IU] | Freq: Three times a day (TID) | INTRAMUSCULAR | Status: DC
  Administered 2019-02-03: 5000 [IU] via SUBCUTANEOUS
  Filled 2019-02-03: qty 1

## 2019-02-03 MED ORDER — ALBUTEROL SULFATE (2.5 MG/3ML) 0.083% IN NEBU: 3.0000 mL | INHALATION_SOLUTION | RESPIRATORY_TRACT | Status: DC | PRN

## 2019-02-03 MED ORDER — HEPARIN (PORCINE) 25000 UT/250ML-% IV SOLN
1600.0000 [IU]/h | INTRAVENOUS | Status: DC
  Administered 2019-02-03: 1600 [IU]/h via INTRAVENOUS
  Filled 2019-02-03: qty 250

## 2019-02-03 MED ORDER — FLUTICASONE PROPIONATE 50 MCG/ACT NA SUSP: 2.0000 | Freq: Every day | NASAL | Status: DC | PRN

## 2019-02-03 MED ORDER — ADULT MULTIVITAMIN W/MINERALS CH
1.0000 | ORAL_TABLET | Freq: Every day | ORAL | Status: DC
  Administered 2019-02-03 – 2019-02-04 (×2): 1 via ORAL
  Filled 2019-02-03 (×2): qty 1

## 2019-02-03 MED ORDER — HEPARIN BOLUS VIA INFUSION
4000.0000 [IU] | Freq: Once | INTRAVENOUS | Status: AC
  Administered 2019-02-03: 4000 [IU] via INTRAVENOUS
  Filled 2019-02-03: qty 4000

## 2019-02-03 MED ORDER — OXYCODONE-ACETAMINOPHEN 5-325 MG PO TABS: 1.0000 | ORAL_TABLET | ORAL | Status: DC | PRN

## 2019-02-03 MED ORDER — PERFLUTREN LIPID MICROSPHERE
1.0000 mL | INTRAVENOUS | Status: AC | PRN
  Administered 2019-02-03: 3 mL via INTRAVENOUS
  Filled 2019-02-03: qty 10

## 2019-02-03 MED ORDER — ONDANSETRON HCL 4 MG/2ML IJ SOLN: 4.0000 mg | Freq: Four times a day (QID) | INTRAMUSCULAR | Status: DC | PRN

## 2019-02-03 MED ORDER — SODIUM CHLORIDE 0.9 % IV SOLN
INTRAVENOUS | Status: DC
  Administered 2019-02-03: 01:00:00 via INTRAVENOUS

## 2019-02-03 MED ORDER — RIVAROXABAN 15 MG PO TABS
15.0000 mg | ORAL_TABLET | Freq: Two times a day (BID) | ORAL | Status: DC
  Administered 2019-02-03 – 2019-02-04 (×2): 15 mg via ORAL
  Filled 2019-02-03 (×2): qty 1

## 2019-02-03 MED ORDER — ACETAMINOPHEN 325 MG PO TABS: 650.0000 mg | ORAL_TABLET | ORAL | Status: DC | PRN

## 2019-02-03 MED ORDER — FAMOTIDINE 20 MG PO TABS
20.0000 mg | ORAL_TABLET | Freq: Every day | ORAL | Status: DC
  Administered 2019-02-03 (×2): 20 mg via ORAL
  Filled 2019-02-03 (×2): qty 1

## 2019-02-03 MED ORDER — TRAZODONE HCL 100 MG PO TABS
100.0000 mg | ORAL_TABLET | Freq: Every day | ORAL | Status: DC
  Administered 2019-02-03 (×2): 100 mg via ORAL
  Filled 2019-02-03: qty 2
  Filled 2019-02-03: qty 1

## 2019-02-03 MED ORDER — ATORVASTATIN CALCIUM 10 MG PO TABS
10.0000 mg | ORAL_TABLET | Freq: Every day | ORAL | Status: DC
  Administered 2019-02-03 – 2019-02-04 (×2): 10 mg via ORAL
  Filled 2019-02-03 (×2): qty 1

## 2019-02-03 MED ORDER — OMEGA-3-ACID ETHYL ESTERS 1 G PO CAPS
2.0000 g | ORAL_CAPSULE | Freq: Every day | ORAL | Status: DC
  Administered 2019-02-03 – 2019-02-04 (×2): 2 g via ORAL
  Filled 2019-02-03 (×3): qty 2

## 2019-02-03 NOTE — Progress Notes (Signed)
  Echocardiogram 2D Echocardiogram with definity has been performed.  Darlina Sicilian M 02/03/2019, 10:03 AM

## 2019-02-03 NOTE — Progress Notes (Signed)
Transitions of Care Pharmacist Note  Joseph Pham is a 68 y.o. male that has been diagnosed with a DVT and PE and will be prescribed Xarelto (rivaroxaban) at discharge.   Patient Education: I provided the following education on Xarelto to the patient: How to take the medication Described what the medication is Signs of bleeding Signs/symptoms of VTE and stroke  Answered their questions  Discharge Medications Plan: The patient wants to have their discharge medications filled by the Transitions of Care pharmacy rather than their usual pharmacy.  The primary doctor for this patient has been contacted to send all discharge medication prescriptions to the Transitions of Care pharmacy, the discharge orders pharmacy has been changed to the Transitions of Care pharmacy, the patient will receive a phone call regarding co-pay, and their medications will be delivered by the Transitions of Care pharmacy.   Insurance information: N/A   Thank you,   Ethelbert Candle, PharmD PGY-1 Pharmacy Resident  February 03, 2019

## 2019-02-03 NOTE — Discharge Instructions (Signed)
Information on my medicine - XARELTO (rivaroxaban)  This medication education was reviewed with me or my healthcare representative as part of my discharge preparation.  The pharmacist that spoke with me during my hospital stay was:  Maryan Char Cataleia Gade, RPH  WHY WAS XARELTO PRESCRIBED FOR YOU? Xarelto was prescribed to treat blood clots that may have been found in the veins of your legs (deep vein thrombosis) or in your lungs (pulmonary embolism) and to reduce the risk of them occurring again.  What do you need to know about Xarelto? The starting dose is one 15 mg tablet taken TWICE daily with food for the FIRST 21 DAYS then on 02/24/2019  the dose is changed to one 20 mg tablet taken ONCE A DAY with your evening meal.  DO NOT stop taking Xarelto without talking to the health care provider who prescribed the medication.  Refill your prescription for 20 mg tablets before you run out.  After discharge, you should have regular check-up appointments with your healthcare provider that is prescribing your Xarelto.  In the future your dose may need to be changed if your kidney function changes by a significant amount.  What do you do if you miss a dose? If you are taking Xarelto TWICE DAILY and you miss a dose, take it as soon as you remember. You may take two 15 mg tablets (total 30 mg) at the same time then resume your regularly scheduled 15 mg twice daily the next day.  If you are taking Xarelto ONCE DAILY and you miss a dose, take it as soon as you remember on the same day then continue your regularly scheduled once daily regimen the next day. Do not take two doses of Xarelto at the same time.   Important Safety Information Xarelto is a blood thinner medicine that can cause bleeding. You should call your healthcare provider right away if you experience any of the following: ? Bleeding from an injury or your nose that does not stop. ? Unusual colored urine (red or dark brown) or unusual  colored stools (red or black). ? Unusual bruising for unknown reasons. ? A serious fall or if you hit your head (even if there is no bleeding).  Some medicines may interact with Xarelto and might increase your risk of bleeding while on Xarelto. To help avoid this, consult your healthcare provider or pharmacist prior to using any new prescription or non-prescription medications, including herbals, vitamins, non-steroidal anti-inflammatory drugs (NSAIDs) and supplements.  This website has more information on Xarelto: [email or web address]

## 2019-02-03 NOTE — Progress Notes (Signed)
Pt ambulated about 1600 ft in hall independently. Denies chest pain, tolerated well.   Fransico Michael, RN

## 2019-02-03 NOTE — Progress Notes (Addendum)
PROGRESS NOTE    Joseph Pham  [id number] DOB: [birth year] DOA: 02/02/2019 PCP: Angelina Sheriff, MD  Brief Narrative: Chief Complaint: chest pain  HPI: Joseph Pham is a 68 y.o. 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 -Has been having worsening dyspnea on exertion for 2 to 3 weeks which got worse last night -CT angiogram the emergency room showed ascending aortic aneurysm and bilateral pulmonary embolism without right heart strain   Assessment & Plan:   Acute pulmonary embolism -Now on IV heparin -Discussed anticoagulation options with patient, will transition to Xarelto -Unprovoked clot will need at least 3 to 6 months of anticoagulation potentially long-term -Follow-up 2D echocardiogram -Patient is morbidly obese with multiple cardiopulmonary risk factors, started on anticoagulation after midnight today for acute PE in my clinical opinion he needs to be ambulated, and monitored at least another 24 hours inpatient, check ambulatory vitals and oxygen saturation  Chest pain -Likely secondary to above, has risk factors for CAD too, follow-up 2D echocardiogram, -Aggressive cardiac work-up not indicated in the setting of acute PE  Ascending aortic aneurysm -4.2 cm on CT -Followed by cardiology Dr. Bettina Gavia, needs follow-up with the CTS  Mild AKI -Baseline creatinine 1.1 last year, hold lisinopril and monitor -Stop IV fluids  Hypertension -Hold lisinopril  Dyslipidemia,  -continue Lipitor  OSA -Continue CPAP  Borderline diabetes -, hemoglobin A1c is 6.1  DVT prophylaxis: On IV heparin changed to Xarelto CODE STATUS full code Family communication none at bedside Disposition Home in 1 to 2 days   Consultants:      Procedures:    Antimicrobials:    Subjective: -Feels better this morning, chest discomfort last night was more pressure like Objective: Vitals:   02/03/19 0800 02/03/19 1000 02/03/19 1057 02/03/19 1142  BP: 129/78 121/75 (!) 113/91 117/77  Pulse: (!) 49 (!) 51 (!) 50 (!) 52  Resp: (!) 7 20 16 20   Temp:    (!) 97.5 F (36.4 C)  TempSrc:    Oral  SpO2: 96% 98% 98% 99%  Weight:    114.4 kg  Height:    5\' 6"  (1.676 m)    Intake/Output Summary (Last 24 hours) at 02/03/2019 1236 Last data filed at 02/03/2019 1011 Gross per 24 hour  Intake --  Output 1925 ml  Net -1925 ml   Filed Weights   02/02/19 2113 02/03/19 0000 02/03/19 1142  Weight: 112.5 kg 112.5 kg 114.4 kg    Examination:  General exam: Obese male laying in bed, AAO x3, no distress Respiratory system: Clear anteriorly, distant breath sounds Cardiovascular system: S1 & S2 heard, RRR. Gastrointestinal system: Abdomen is nondistended, soft and nontender.Normal bowel sounds heard. Central nervous system: Alert and oriented. No focal neurological deficits. Extremities: No edema Skin: No rashes, lesions or ulcers Psychiatry: Judgement and insight appear normal. Mood & affect appropriate.     Data Reviewed:   CBC: Recent Labs  Lab 02/02/19 2123 02/03/19 0130  WBC 5.6 5.3  HGB 13.7 13.6  HCT 39.8 40.2  MCV 88.1 89.5  PLT 168 0000000   Basic Metabolic Panel: Recent Labs  Lab 02/02/19 2123  NA 137  K 3.8  CL 102  CO2 22  GLUCOSE 149*  BUN 18  CREATININE 1.32*  CALCIUM 9.0   GFR: Estimated Creatinine Clearance: 64.5 mL/min (A) (by C-G  formula based on SCr of 1.32 mg/dL (H)). Liver Function Tests: No results for input(s): AST, ALT, ALKPHOS, BILITOT, PROT, ALBUMIN in the last 168 hours. No results for input(s): LIPASE, AMYLASE in the last 168 hours. No results for input(s): AMMONIA in the last 168 hours. Coagulation Profile: No results for input(s): INR, PROTIME in the last 168 hours. Cardiac Enzymes: No results for  input(s): CKTOTAL, CKMB, CKMBINDEX, TROPONINI in the last 168 hours. BNP (last 3 results) No results for input(s): PROBNP in the last 8760 hours. HbA1C: Recent Labs    02/03/19 0130  HGBA1C 6.1*   CBG: Recent Labs  Lab 02/03/19 1158  GLUCAP 127*   Lipid Profile: Recent Labs    02/03/19 0130  CHOL 153  HDL 35*  LDLCALC 71  TRIG 235*  CHOLHDL 4.4   Thyroid Function Tests: No results for input(s): TSH, T4TOTAL, FREET4, T3FREE, THYROIDAB in the last 72 hours. Anemia Panel: No results for input(s): VITAMINB12, FOLATE, FERRITIN, TIBC, IRON, RETICCTPCT in the last 72 hours. Urine analysis:    Component Value Date/Time   COLORURINE YELLOW 02/07/2009 0800   APPEARANCEUR CLEAR 02/07/2009 0800   LABSPEC 1.018 02/07/2009 0800   PHURINE 5.5 02/07/2009 0800   GLUCOSEU NEGATIVE 02/07/2009 0800   HGBUR LARGE (A) 02/07/2009 0800   BILIRUBINUR NEGATIVE 02/07/2009 0800   KETONESUR NEGATIVE 02/07/2009 0800   PROTEINUR NEGATIVE 02/07/2009 0800   UROBILINOGEN 0.2 02/07/2009 0800   NITRITE NEGATIVE 02/07/2009 0800   LEUKOCYTESUR NEGATIVE 02/07/2009 0800   Sepsis Labs: @LABRCNTIP (procalcitonin:4,lacticidven:4)  ) Recent Results (from the past 240 hour(s))  SARS CORONAVIRUS 2 (TAT 6-24 HRS) Nasopharyngeal Nasopharyngeal Swab     Status: None   Collection Time: 02/02/19 11:24 PM   Specimen: Nasopharyngeal Swab  Result Value Ref Range Status   SARS Coronavirus 2 NEGATIVE NEGATIVE Final    Comment: (NOTE) SARS-CoV-2 target nucleic acids are NOT DETECTED. The SARS-CoV-2 RNA is generally detectable in upper and lower respiratory specimens during the acute phase of infection. Negative results do not preclude SARS-CoV-2 infection, do not rule out co-infections with other pathogens, and should not be used as the sole basis for treatment or other patient management decisions. Negative results must be combined with clinical observations, patient history, and epidemiological information.  The expected result is Negative. Fact Sheet for Patients: SugarRoll.be Fact Sheet for Healthcare Providers: https://www.woods-mathews.com/ This test is not yet approved or cleared by the [place] FDA and  has been authorized for detection and/or diagnosis of SARS-CoV-2 by FDA under an Emergency Use Authorization (EUA). This EUA will remain  in effect (meaning this test can be used) for the duration of the COVID-19 declaration under Section 56 4(b)(1) of the Act, 21 U.S.C. section 360bbb-3(b)(1), unless the authorization is terminated or revoked sooner. Performed at [facility], [place] [address], [place], [place] [address]          Radiology Studies: Dg Chest Portable 1 View  Result Date: 02/02/2019 CLINICAL DATA:  Chest pain. EXAM: PORTABLE CHEST 1 VIEW COMPARISON:  Chest radiograph 06/23/2018, CT 04/19/2018 FINDINGS: The cardiomediastinal contours are unchanged with mild cardiomegaly. The lungs are clear. Pulmonary vasculature is normal. No consolidation, pleural effusion, or pneumothorax. No acute osseous abnormalities are seen. IMPRESSION: Stable mild cardiomegaly without acute abnormality. Electronically Signed   By: Keith Rake M.D.   On: 02/02/2019 21:40   Ct Angio Chest/abd/pel For Dissection W And/or W/wo  Addendum Date: 02/03/2019   ADDENDUM REPORT: 02/03/2019 00:34 ADDENDUM: These results were called by  telephone on 02/03/2019 at 12:34 am to Dr. Ivor Costa , who verbally acknowledged these results. Electronically Signed   By: Lovena Le M.D.   On: 02/03/2019 00:34   Result Date: 02/03/2019 CLINICAL DATA:  Chest pain, acute aortic symptom suspected, hemodynamically stable EXAM: CT ANGIOGRAPHY CHEST, ABDOMEN AND PELVIS TECHNIQUE: Multidetector CT imaging through the chest, abdomen and pelvis was performed using the standard protocol during bolus administration of intravenous contrast. Multiplanar reconstructed images and MIPs  were obtained and reviewed to evaluate the vascular anatomy. CONTRAST:  128mL OMNIPAQUE IOHEXOL 350 MG/ML SOLN COMPARISON:  CT chest 04/19/2018 FINDINGS: CTA CHEST FINDINGS Cardiovascular: Noncontrast CT scan of the chest reveals no abnormal mural hyper attenuation or plaque displacement to suggest intramural hematoma. Postcontrast CT images demonstrate preferential opacification of the thoracic aorta The aortic root is suboptimally assessed given cardiac pulsation artifact. Dilation of the ascending thoracic aorta up to 4.2 cm is not significantly changed in caliber from comparison CT dated 04/19/2018. There is reservation of a normal caliber by the level of the normally branching aortic arch with vessel caliber measuring 2.6 cm just distal to the left subclavian origin and the descending thoracic aorta measuring 2.4 cm. No intramural hematoma, dissection flap or other luminal abnormality of the aorta is seen. No periaortic stranding or hemorrhage. Non tailored examination of the pulmonary arteries reveals few linear filling defects in the segmental pulmonary arteries of the posterior basal and anterior basal segments of the right lower lobe as well as the anterior segments of the right upper lobe. No significant increase in the RV-LV ratio (0.84). Normal heart size. No pericardial effusion. Atherosclerotic calcification of the coronary arteries. Few calcifications are present on the aortic leaflets. Punctate focus of gas in left brachiocephalic vein likely related to intravenous access. Mediastinum/Nodes: No enlarged mediastinal or axillary lymph nodes. Thyroid gland, trachea, and esophagus demonstrate no significant findings. Lungs/Pleura: No consolidation, features of edema, pneumothorax, or effusion. No suspicious pulmonary nodules or masses. Small amount of subpleural fat in left lung base Musculoskeletal: Multilevel degenerative changes are present in the imaged portions of the spine. No suspicious chest wall  lesions. Mild bilateral gynecomastia. Review of the MIP images confirms the above findings. CTA ABDOMEN AND PELVIS FINDINGS VASCULAR Aorta: Atheromatous plaque seen throughout the abdominal aorta. No aneurysm or ectasia. No dissection, features of vasculitis or significant stenosis. Celiac: Patent without evidence of aneurysm, dissection, vasculitis or significant stenosis. SMA: Patent without evidence of aneurysm, dissection, vasculitis or significant stenosis. Renals: Single renal arteries bilaterally. Ostial plaque at the left renal artery results in at least mild stenosis. Right renal ostia is widely patent. No evidence of aneurysm, dissection or vasculitis. No features of fibromuscular dysplasia. IMA: Patent without evidence of aneurysm, dissection, vasculitis or significant stenosis. Inflow: Atheromatous plaque throughout the inflows most pronounced in the common iliac arteries. No significant stenosis. No evidence of aneurysm, dissection or vasculitis. Veins: No obvious venous abnormality within the limitations of this arterial phase study. Review of the MIP images confirms the above findings. NON-VASCULAR Hepatobiliary: No focal liver abnormality is seen. No gallstones, gallbladder wall thickening, or biliary dilatation. Pancreas: Unremarkable. No pancreatic ductal dilatation or surrounding inflammatory changes. Spleen: Patchy enhancement the spleen, expected for the arterial phase. No splenic abnormality. Normal size. Adrenals/Urinary Tract: Normal adrenal glands. Multiple nonobstructing calculi are present within the left kidney, largest in the upper pole measures 15 mm in size. No right urolithiasis. No obstructing calculi. No hydronephrosis. No concerning renal masses. Mild bilateral nonspecific perinephric stranding,  a nonspecific finding though may correlate with either age or decreased renal function. Urinary bladder is unremarkable. Stomach/Bowel: Distal esophagus, stomach and duodenal sweep are  unremarkable. No bowel wall thickening or dilatation. No evidence of obstruction. A normal appendix is visualized. Scattered colonic diverticula without focal pericolonic inflammation to suggest diverticulitis. Lymphatic: No suspicious or enlarged lymph nodes in the included lymphatic chains. Reproductive: Multiple prostatic brachytherapy implants are noted. Seminal vesicles are unremarkable. Other: No abdominopelvic free fluid or free gas. No bowel containing hernias. Small fat containing umbilical hernia Musculoskeletal: Prior L4-5 posterior spinal fusion and interbody spacer placement. 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 Review of the MIP images confirms the above findings. IMPRESSION: 1. No evidence of acute aortic syndrome. Unchanged aneurysmal dilatation of the ascending thoracic aorta up to 4.2 cm. Recommend semi-annual imaging followup by CTA or MRA and referral to cardiothoracic surgery if not already obtained. This recommendation follows 2010 ACCF/AHA/AATS/ACR/ASA/SCA/SCAI/SIR/STS/SVM Guidelines for the Diagnosis and Management of Patients With Thoracic Aortic Disease. Circulation. 2010; [id number]. Aortic aneurysm NOS (ICD10-I71.9) 2. Filling defects in the segmental pulmonary arteries of the right lower lobe and right upper lobe, consistent pulmonary embolus without CT evidence of right heart strain. 3. No acute intra-abdominal process. 4. Nonobstructing left nephrolithiasis. 5. Prostatic brachytherapy seeds are noted. 6. Colonic diverticulosis without evidence of diverticulitis. 7. Aortic Atherosclerosis (ICD10-I70.0). Mild ostial narrowing of the left renal artery. Currently attempting to contact the ordering provider with critical results. Addendum will be placed upon discussion with the patient's care team. Electronically Signed: By: Lovena Le M.D. On: 02/03/2019 00:29   Vas [place] Lower Extremity Venous (dvt)  Result Date: 02/03/2019  Lower Venous  Study Indications: Pulmonary embolism.  Risk Factors: None identified. Comparison Study: No prior studies. Performing Technologist: Oliver Hum RVT  Examination Guidelines: A complete evaluation includes B-mode imaging, spectral Doppler, color Doppler, and power Doppler as needed of all accessible portions of each vessel. Bilateral testing is considered an integral part of a complete examination. Limited examinations for reoccurring indications may be performed as noted.  +---------+---------------+---------+-----------+----------+--------------+  RIGHT     Compressibility Phasicity Spontaneity Properties Thrombus Aging  +---------+---------------+---------+-----------+----------+--------------+  CFV       Full            Yes       Yes                                    +---------+---------------+---------+-----------+----------+--------------+  SFJ       Full                                                             +---------+---------------+---------+-----------+----------+--------------+  FV Prox   Full                                                             +---------+---------------+---------+-----------+----------+--------------+  FV Mid    Full                                                             +---------+---------------+---------+-----------+----------+--------------+  FV Distal Full                                                             +---------+---------------+---------+-----------+----------+--------------+  PFV       Full                                                             +---------+---------------+---------+-----------+----------+--------------+  POP       Full            Yes       Yes                                    +---------+---------------+---------+-----------+----------+--------------+  PTV       Full                                                             +---------+---------------+---------+-----------+----------+--------------+  PERO      Full                                                              +---------+---------------+---------+-----------+----------+--------------+   +---------+---------------+---------+-----------+----------+--------------+  LEFT      Compressibility Phasicity Spontaneity Properties Thrombus Aging  +---------+---------------+---------+-----------+----------+--------------+  CFV       Full            Yes       Yes                                    +---------+---------------+---------+-----------+----------+--------------+  SFJ       Full                                                             +---------+---------------+---------+-----------+----------+--------------+  FV Prox   Full                                                             +---------+---------------+---------+-----------+----------+--------------+  FV Mid    Full                                                             +---------+---------------+---------+-----------+----------+--------------+  FV Distal Full                                                             +---------+---------------+---------+-----------+----------+--------------+  PFV       Full                                                             +---------+---------------+---------+-----------+----------+--------------+  POP       Full            Yes       Yes                                    +---------+---------------+---------+-----------+----------+--------------+  PTV       Full                                                             +---------+---------------+---------+-----------+----------+--------------+  PERO      Full                                                             +---------+---------------+---------+-----------+----------+--------------+     Summary: Right: There is no evidence of deep vein thrombosis in the lower extremity. No cystic structure found in the popliteal fossa. Left: There is no evidence of deep vein thrombosis in the lower extremity. No cystic  structure found in the popliteal fossa.  *See table(s) above for measurements and observations.    Preliminary         Scheduled Meds:  atorvastatin  10 mg Oral Daily   cholecalciferol  1,000 Units Oral Daily   famotidine  20 mg Oral QHS   loratadine  10 mg Oral Daily   Melatonin  6 mg Oral QHS   multivitamin with minerals  1 tablet Oral Daily   omega-3 acid ethyl esters  2 g Oral Daily   traZODone  100 mg Oral QHS   Continuous Infusions:  sodium chloride 75 mL/hr at 02/03/19 0037   heparin 1,600 Units/hr (02/03/19 0142)     LOS: 0 days    Time spent: 94min    Domenic Polite, MD [facility]  02/03/2019, 12:36 PM

## 2019-02-03 NOTE — Progress Notes (Signed)
Bilateral lower extremity venous duplex has been completed. Preliminary results can be found in CV Proc through chart review.   02/03/19 10:58 AM Carlos Levering RVT

## 2019-02-03 NOTE — Progress Notes (Signed)
ANTICOAGULATION CONSULT NOTE - Initial Consult  Pharmacy Consult for heparin Indication: pulmonary embolus  Allergies  Allergen Reactions  . Aspirin Anaphylaxis  . Amoxicillin Itching    Patient Measurements: Height: 5\' 6"  (167.6 cm) Weight: 248 lb 0.3 oz (112.5 kg) IBW/kg (Calculated) : 63.8 Heparin Dosing Weight: 90kg  Vital Signs: Temp: 98.2 F (36.8 C) (09/02 2112) Temp Source: Oral (09/02 2112) BP: 133/75 (09/02 2330) Pulse Rate: 59 (09/02 2330)  Labs: Recent Labs    02/02/19 2123 02/02/19 2323  HGB 13.7  --   HCT 39.8  --   PLT 168  --   CREATININE 1.32*  --   TROPONINIHS 5 4    Estimated Creatinine Clearance: 64 mL/min (A) (by C-G formula based on SCr of 1.32 mg/dL (H)).   Medical History: Past Medical History:  Diagnosis Date  . Abnormal EKG 10/14/2015   Overview:  Poor R wave progression and abnormal T waves  . Acid reflux 03/16/2017  . 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 Aftercare following surgery 08/30/2015  . Allergy   . Arthritis HANDS AND LEFT KNEE  . Ascending aorta enlargement ([place]) 08/19/2016   Overview:  43 mm ascending, 11/22/15  . Cataract    forming bilaterally   . Diabetes ([place]) 03/16/2017  . Essential hypertension 10/14/2015  . GERD (gastroesophageal reflux disease)   . Hematuria   . Hemorrhoid   . History of kidney stones   . Hyperlipidemia   . Hypertension   . Kidney stone 03/16/2017  . Lumbar radiculopathy 03/16/2017  . OSA on CPAP   . Paronychia 08/16/2015  . Preoperative cardiovascular examination 08/19/2016  . Prostate cancer ([place]) 03/16/2017  . Right flank pain   . Right ureteral stone   . Skin cancer   . Sleep apnea   . Type 2 diabetes, diet controlled ([place])    off Metformin > 1 yr now as of PV 02-23-18  . Ureteral calculus, right 03/15/2012  . Wears glasses     Assessment: 68yo male c/o SOB and chest pressure x3d, initially admitted for ACS r/o but now CT c/w RLL/RUL PE, to begin heparin.  Goal of  Therapy:  Heparin level 0.3-0.7 units/ml Monitor platelets by anticoagulation protocol: Yes   Plan:  Rec'd SQ heparin <1h ago; will give smaller heparin bolus of 4000 units x1 and begin heparin gtt at 1600 units/hr and monitor heparin levels and CBC.  Wynona Neat, PharmD, BCPS  02/03/2019,12:43 AM

## 2019-02-03 NOTE — Progress Notes (Signed)
Patient stated he has had a CPAP for 6 years, he self manages his machine.  Placed sterile water in machine for patient and placed within reach.  No distress noted.

## 2019-02-04 LAB — BASIC METABOLIC PANEL
Anion gap: 10 (ref 5–15)
BUN: 16 mg/dL (ref 8–23)
CO2: 23 mmol/L (ref 22–32)
Calcium: 9.1 mg/dL (ref 8.9–10.3)
Chloride: 104 mmol/L (ref 98–111)
Creatinine, Ser: 1.13 mg/dL (ref 0.61–1.24)
GFR calc Af Amer: >60 mL/min (ref >60)
GFR calc non Af Amer: >60 mL/min (ref >60)
Glucose, Bld: 110 mg/dL — ABNORMAL HIGH (ref 70–99)
Potassium: 4 mmol/L (ref 3.5–5.1)
Sodium: 137 mmol/L (ref 135–145)

## 2019-02-04 LAB — CBC
HCT: 40.6 % (ref 39.0–52.0)
Hemoglobin: 14 g/dL (ref 13.0–17.0)
MCH: 30.4 pg (ref 26.0–34.0)
MCHC: 34.5 g/dL (ref 30.0–36.0)
MCV: 88.3 fL (ref 80.0–100.0)
Platelets: 156 10*3/uL (ref 150–400)
RBC: 4.6 MIL/uL (ref 4.22–5.81)
RDW: 13.7 % (ref 11.5–15.5)
WBC: 5.3 10*3/uL (ref 4.0–10.5)
nRBC: 0 % (ref 0.0–0.2)

## 2019-02-04 LAB — HIV ANTIBODY (ROUTINE TESTING W REFLEX): HIV Screen 4th Generation wRfx: NONREACTIVE

## 2019-02-04 MED ORDER — RIVAROXABAN (XARELTO) VTE STARTER PACK (15 & 20 MG): ORAL_TABLET | ORAL | 0 refills | Status: DC

## 2019-02-04 MED FILL — XARELTO STARTER PACK: 15 & 20 | 30 days supply | Qty: 51 | Fill #0

## 2019-02-04 NOTE — Discharge Summary (Signed)
Physician Discharge Summary  Joseph Pham [id number] DOB: Oct 15, 1950 DOA: 02/02/2019  PCP: Joseph Sheriff, MD  Admit date: 02/02/2019 Discharge date: 02/04/2019  Time spent: 45 minutes  Recommendations for Outpatient Follow-up:  PCP in 1 week Cardiology Dr. Bettina Gavia in 1 month CVTS, needs referral for ascending aortic aneurysm  Discharge Diagnoses:  Principal Problem:   PE (pulmonary thromboembolism) ([place]) Active Problems:   Ascending aorta enlargement ([place])   Essential hypertension   Hyperlipidemia   CKD (chronic kidney disease), stage II   GERD (gastroesophageal reflux disease)   OSA (obstructive sleep apnea)   Chest pain   Discharge Condition: Improved  Diet recommendation: Low-sodium heart healthy  Filed Weights   02/03/19 0000 02/03/19 1142 02/04/19 0328  Weight: 112.5 kg 114.4 kg 112.9 kg    History of present illness:  Joseph T Briggsis a 68 y.o.malewith medical history significant ofhypertension, hyperlipidemia, diet-controlled diabetes, GERD, OSA on CPAP, prostate cancer (radiation seeds placement), kidney stone, ascending aorta enlargement (43 mm 2017), CKD stage II, who presented with chest pain. -Has been having worsening dyspnea on exertion for 2 to 3 weeks which got worse last night -CT angiogram the emergency room showed ascending aortic aneurysm and bilateral pulmonary embolism without right heart strain  Hospital Course:   Acute pulmonary embolism -Treated with IV heparin on admission, transitioned to [place] yesterday -Unprovoked clot will need at least  6 months of anticoagulation potentially long-term -2D echocardiogram noted preserved EF, no RV failure -Remains stable, ambulating without much distress or hypoxia -Discharged home in a stable condition, advised to follow-up with his PCP in 1 week  Chest pain -Likely secondary to above, has risk factors for CAD too,  2D echo noted preserved EF and wall motion -Aggressive cardiac work-up  not indicated in the setting of acute PE -Follow-up with Dr. Bettina Gavia in 1 to 2 months  Ascending aortic aneurysm -4.2 cm on CT -Followed by cardiology Dr. Bettina Gavia, needs follow-up with the CTS  Mild AKI -Baseline creatinine 1.1 last year,  -Resolved with hydration, lisinopril restarted  Hypertension -Resumed lisinopril  Dyslipidemia,  -continue Lipitor  OSA -Continue CPAP  Borderline diabetes -, hemoglobin A1c is 6.1  Discharge Exam: Vitals:   02/04/19 0815 02/04/19 0853  BP: 125/85 129/81  Pulse: (!) 55 (!) 59  Resp:  17  Temp:  97.8 F (36.6 C)  SpO2:  98%    General: AAOx3 Cardiovascular: S1S2/RRR Respiratory: CTAB  Discharge Instructions   Discharge Instructions    Diet - low sodium heart healthy   Complete by: As directed    Increase activity slowly   Complete by: As directed      Allergies as of 02/04/2019      Reactions   Aspirin Anaphylaxis   Amoxicillin Itching      Medication List    TAKE these medications   atorvastatin 10 MG tablet Commonly known as: LIPITOR Take 10 mg by mouth daily.   cholecalciferol 25 MCG (1000 UT) tablet Commonly known as: VITAMIN D3 Take 1,000 Units by mouth daily.   famotidine 20 MG tablet Commonly known as: PEPCID Take 20 mg by mouth at bedtime.   fexofenadine 180 MG tablet Commonly known as: ALLEGRA Take 180 mg by mouth daily.   fluticasone 50 MCG/ACT nasal spray Commonly known as: FLONASE Place 2 sprays into the nose daily as needed for allergies.   lisinopril 10 MG tablet Commonly known as: ZESTRIL Take 10 mg by mouth daily.   Melatonin 5 MG Caps Take  1 capsule by mouth at bedtime.   multivitamin with minerals Tabs tablet Take 1 tablet by mouth daily.   Omega 3 1200 MG Caps Take 4 capsules by mouth daily.   Rivaroxaban 15 & 20 MG Tbpk Take as directed: Start with one 15mg  tablet by mouth twice a day with food. On Day 22, switch to one 20mg  tablet once a day with food.   traZODone 100  MG tablet Commonly known as: DESYREL Take 100 mg at bedtime by mouth.      Allergies  Allergen Reactions  . Aspirin Anaphylaxis  . Amoxicillin Itching   Follow-up Information    Joseph Sheriff, MD. Go on 02/10/2019.   Specialty: Family Medicine Why: @11 :10 Contact information: [place] [address] [phone]            The results of significant diagnostics from this hospitalization (including imaging, microbiology, ancillary and laboratory) are listed below for reference.    Significant Diagnostic Studies: Dg Chest Portable 1 View  Result Date: 02/02/2019 CLINICAL DATA:  Chest pain. EXAM: PORTABLE CHEST 1 VIEW COMPARISON:  Chest radiograph 06/23/2018, CT 04/19/2018 FINDINGS: The cardiomediastinal contours are unchanged with mild cardiomegaly. The lungs are clear. Pulmonary vasculature is normal. No consolidation, pleural effusion, or pneumothorax. No acute osseous abnormalities are seen. IMPRESSION: Stable mild cardiomegaly without acute abnormality. Electronically Signed   By: Keith Rake M.D.   On: 02/02/2019 21:40   Ct Angio Chest/abd/pel For Dissection W And/or W/wo  Addendum Date: 02/03/2019   ADDENDUM REPORT: 02/03/2019 00:34 ADDENDUM: These results were called by telephone on 02/03/2019 at 12:34 am to Dr. Ivor Costa , who verbally acknowledged these results. Electronically Signed   By: Lovena Le M.D.   On: 02/03/2019 00:34   Result Date: 02/03/2019 CLINICAL DATA:  Chest pain, acute aortic symptom suspected, hemodynamically stable EXAM: CT ANGIOGRAPHY CHEST, ABDOMEN AND PELVIS TECHNIQUE: Multidetector CT imaging through the chest, abdomen and pelvis was performed using the standard protocol during bolus administration of intravenous contrast. Multiplanar reconstructed images and MIPs were obtained and reviewed to evaluate the vascular anatomy. CONTRAST:  120mL OMNIPAQUE IOHEXOL 350 MG/ML SOLN COMPARISON:  CT chest 04/19/2018 FINDINGS: CTA CHEST  FINDINGS Cardiovascular: Noncontrast CT scan of the chest reveals no abnormal mural hyper attenuation or plaque displacement to suggest intramural hematoma. Postcontrast CT images demonstrate preferential opacification of the thoracic aorta The aortic root is suboptimally assessed given cardiac pulsation artifact. Dilation of the ascending thoracic aorta up to 4.2 cm is not significantly changed in caliber from comparison CT dated 04/19/2018. There is reservation of a normal caliber by the level of the normally branching aortic arch with vessel caliber measuring 2.6 cm just distal to the left subclavian origin and the descending thoracic aorta measuring 2.4 cm. No intramural hematoma, dissection flap or other luminal abnormality of the aorta is seen. No periaortic stranding or hemorrhage. Non tailored examination of the pulmonary arteries reveals few linear filling defects in the segmental pulmonary arteries of the posterior basal and anterior basal segments of the right lower lobe as well as the anterior segments of the right upper lobe. No significant increase in the RV-LV ratio (0.84). Normal heart size. No pericardial effusion. Atherosclerotic calcification of the coronary arteries. Few calcifications are present on the aortic leaflets. Punctate focus of gas in left brachiocephalic vein likely related to intravenous access. Mediastinum/Nodes: No enlarged mediastinal or axillary lymph nodes. Thyroid gland, trachea, and esophagus demonstrate no significant findings. Lungs/Pleura: No consolidation, features  of edema, pneumothorax, or effusion. No suspicious pulmonary nodules or masses. Small amount of subpleural fat in left lung base Musculoskeletal: Multilevel degenerative changes are present in the imaged portions of the spine. No suspicious chest wall lesions. Mild bilateral gynecomastia. Review of the MIP images confirms the above findings. CTA ABDOMEN AND PELVIS FINDINGS VASCULAR Aorta: Atheromatous plaque  seen throughout the abdominal aorta. No aneurysm or ectasia. No dissection, features of vasculitis or significant stenosis. Celiac: Patent without evidence of aneurysm, dissection, vasculitis or significant stenosis. SMA: Patent without evidence of aneurysm, dissection, vasculitis or significant stenosis. Renals: Single renal arteries bilaterally. Ostial plaque at the left renal artery results in at least mild stenosis. Right renal ostia is widely patent. No evidence of aneurysm, dissection or vasculitis. No features of fibromuscular dysplasia. IMA: Patent without evidence of aneurysm, dissection, vasculitis or significant stenosis. Inflow: Atheromatous plaque throughout the inflows most pronounced in the common iliac arteries. No significant stenosis. No evidence of aneurysm, dissection or vasculitis. Veins: No obvious venous abnormality within the limitations of this arterial phase study. Review of the MIP images confirms the above findings. NON-VASCULAR Hepatobiliary: No focal liver abnormality is seen. No gallstones, gallbladder wall thickening, or biliary dilatation. Pancreas: Unremarkable. No pancreatic ductal dilatation or surrounding inflammatory changes. Spleen: Patchy enhancement the spleen, expected for the arterial phase. No splenic abnormality. Normal size. Adrenals/Urinary Tract: Normal adrenal glands. Multiple nonobstructing calculi are present within the left kidney, largest in the upper pole measures 15 mm in size. No right urolithiasis. No obstructing calculi. No hydronephrosis. No concerning renal masses. Mild bilateral nonspecific perinephric stranding, a nonspecific finding though may correlate with either age or decreased renal function. Urinary bladder is unremarkable. Stomach/Bowel: Distal esophagus, stomach and duodenal sweep are unremarkable. No bowel wall thickening or dilatation. No evidence of obstruction. A normal appendix is visualized. Scattered colonic diverticula without focal  pericolonic inflammation to suggest diverticulitis. Lymphatic: No suspicious or enlarged lymph nodes in the included lymphatic chains. Reproductive: Multiple prostatic brachytherapy implants are noted. Seminal vesicles are unremarkable. Other: No abdominopelvic free fluid or free gas. No bowel containing hernias. Small fat containing umbilical hernia Musculoskeletal: Prior L4-5 posterior spinal fusion and interbody spacer placement. 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 Review of the MIP images confirms the above findings. IMPRESSION: 1. No evidence of acute aortic syndrome. Unchanged aneurysmal dilatation of the ascending thoracic aorta up to 4.2 cm. Recommend semi-annual imaging followup by CTA or MRA and referral to cardiothoracic surgery if not already obtained. This recommendation follows 2010 ACCF/AHA/AATS/ACR/ASA/SCA/SCAI/SIR/STS/SVM Guidelines for the Diagnosis and Management of Patients With Thoracic Aortic Disease. Circulation. 2010; [id number]. Aortic aneurysm NOS (ICD10-I71.9) 2. Filling defects in the segmental pulmonary arteries of the right lower lobe and right upper lobe, consistent pulmonary embolus without CT evidence of right heart strain. 3. No acute intra-abdominal process. 4. Nonobstructing left nephrolithiasis. 5. Prostatic brachytherapy seeds are noted. 6. Colonic diverticulosis without evidence of diverticulitis. 7. Aortic Atherosclerosis (ICD10-I70.0). Mild ostial narrowing of the left renal artery. Currently attempting to contact the ordering provider with critical results. Addendum will be placed upon discussion with the patient's care team. Electronically Signed: By: Lovena Le M.D. On: 02/03/2019 00:29   Vas [place] Lower Extremity Venous (dvt)  Result Date: 02/03/2019  Lower Venous Study Indications: Pulmonary embolism.  Risk Factors: None identified. Comparison Study: No prior studies. Performing Technologist: Oliver Hum RVT   Examination Guidelines: A complete evaluation includes B-mode imaging, spectral Doppler, color Doppler, and power  Doppler as needed of all accessible portions of each vessel. Bilateral testing is considered an integral part of a complete examination. Limited examinations for reoccurring indications may be performed as noted.  +---------+---------------+---------+-----------+----------+--------------+ RIGHT    CompressibilityPhasicitySpontaneityPropertiesThrombus Aging +---------+---------------+---------+-----------+----------+--------------+ CFV      Full           Yes      Yes                                 +---------+---------------+---------+-----------+----------+--------------+ SFJ      Full                                                        +---------+---------------+---------+-----------+----------+--------------+ FV Prox  Full                                                        +---------+---------------+---------+-----------+----------+--------------+ FV Mid   Full                                                        +---------+---------------+---------+-----------+----------+--------------+ FV DistalFull                                                        +---------+---------------+---------+-----------+----------+--------------+ PFV      Full                                                        +---------+---------------+---------+-----------+----------+--------------+ POP      Full           Yes      Yes                                 +---------+---------------+---------+-----------+----------+--------------+ PTV      Full                                                        +---------+---------------+---------+-----------+----------+--------------+ PERO     Full                                                        +---------+---------------+---------+-----------+----------+--------------+    +---------+---------------+---------+-----------+----------+--------------+ LEFT     CompressibilityPhasicitySpontaneityPropertiesThrombus Aging +---------+---------------+---------+-----------+----------+--------------+ CFV  Full           Yes      Yes                                 +---------+---------------+---------+-----------+----------+--------------+ SFJ      Full                                                        +---------+---------------+---------+-----------+----------+--------------+ FV Prox  Full                                                        +---------+---------------+---------+-----------+----------+--------------+ FV Mid   Full                                                        +---------+---------------+---------+-----------+----------+--------------+ FV DistalFull                                                        +---------+---------------+---------+-----------+----------+--------------+ PFV      Full                                                        +---------+---------------+---------+-----------+----------+--------------+ POP      Full           Yes      Yes                                 +---------+---------------+---------+-----------+----------+--------------+ PTV      Full                                                        +---------+---------------+---------+-----------+----------+--------------+ PERO     Full                                                        +---------+---------------+---------+-----------+----------+--------------+     Summary: Right: There is no evidence of deep vein thrombosis in the lower extremity. No cystic structure found in the popliteal fossa. Left: There is no evidence of deep vein thrombosis in the lower extremity. No cystic structure found in the popliteal fossa.  *See table(s) above for measurements and observations. Electronically signed by Servando Snare MD  on 02/03/2019 at 3:40:46 PM.    Final     Microbiology: Recent Results (from the past 240 hour(s))  SARS CORONAVIRUS 2 (TAT 6-24 HRS) Nasopharyngeal Nasopharyngeal Swab     Status: None   Collection Time: 02/02/19 11:24 PM   Specimen: Nasopharyngeal Swab  Result Value Ref Range Status   SARS Coronavirus 2 NEGATIVE NEGATIVE Final    Comment: (NOTE) SARS-CoV-2 target nucleic acids are NOT DETECTED. The SARS-CoV-2 RNA is generally detectable in upper and lower respiratory specimens during the acute phase of infection. Negative results do not preclude SARS-CoV-2 infection, do not rule out co-infections with other pathogens, and should not be used as the sole basis for treatment or other patient management decisions. Negative results must be combined with clinical observations, patient history, and epidemiological information. The expected result is Negative. Fact Sheet for Patients: SugarRoll.be Fact Sheet for Healthcare Providers: https://www.woods-mathews.com/ This test is not yet approved or cleared by the [place] FDA and  has been authorized for detection and/or diagnosis of SARS-CoV-2 by FDA under an Emergency Use Authorization (EUA). This EUA will remain  in effect (meaning this test can be used) for the duration of the COVID-19 declaration under Section 56 4(b)(1) of the Act, 21 U.S.C. section 360bbb-3(b)(1), unless the authorization is terminated or revoked sooner. Performed at [facility], [place] [address] [place], [place] [address]      Labs: Basic Metabolic Panel: Recent Labs  Lab 02/02/19 2123 02/04/19 0516  NA 137 137  K 3.8 4.0  CL 102 104  CO2 22 23  GLUCOSE 149* 110*  BUN 18 16  CREATININE 1.32* 1.13  CALCIUM 9.0 9.1   Liver Function Tests: No results for input(s): AST, ALT, ALKPHOS, BILITOT, PROT, ALBUMIN in the last 168 hours. No results for input(s): LIPASE, AMYLASE in the last 168  hours. No results for input(s): AMMONIA in the last 168 hours. CBC: Recent Labs  Lab 02/02/19 2123 02/03/19 0130 02/04/19 0516  WBC 5.6 5.3 5.3  HGB 13.7 13.6 14.0  HCT 39.8 40.2 40.6  MCV 88.1 89.5 88.3  PLT 168 152 156   Cardiac Enzymes: No results for input(s): CKTOTAL, CKMB, CKMBINDEX, TROPONINI in the last 168 hours. BNP: BNP (last 3 results) No results for input(s): BNP in the last 8760 hours.  ProBNP (last 3 results) No results for input(s): PROBNP in the last 8760 hours.  CBG: Recent Labs  Lab 02/03/19 1158  GLUCAP 127*       Signed:  Domenic Polite MD.  [facility] 02/04/2019, 2:39 PM

## 2019-02-10 DIAGNOSIS — Z1331 Encounter for screening for depression: Secondary | ICD-10-CM | POA: Diagnosis not present

## 2019-02-10 DIAGNOSIS — Z9181 History of falling: Secondary | ICD-10-CM | POA: Diagnosis not present

## 2019-02-10 DIAGNOSIS — Z6841 Body Mass Index (BMI) 40.0 and over, adult: Secondary | ICD-10-CM | POA: Diagnosis not present

## 2019-02-10 DIAGNOSIS — I2699 Other pulmonary embolism without acute cor pulmonale: Secondary | ICD-10-CM | POA: Diagnosis not present

## 2019-02-24 DIAGNOSIS — Z832 Family history of diseases of the blood and blood-forming organs and certain disorders involving the immune mechanism: Secondary | ICD-10-CM | POA: Diagnosis not present

## 2019-02-24 DIAGNOSIS — E7800 Pure hypercholesterolemia, unspecified: Secondary | ICD-10-CM | POA: Diagnosis not present

## 2019-02-24 DIAGNOSIS — E785 Hyperlipidemia, unspecified: Secondary | ICD-10-CM | POA: Diagnosis not present

## 2019-02-24 DIAGNOSIS — Z7901 Long term (current) use of anticoagulants: Secondary | ICD-10-CM | POA: Diagnosis not present

## 2019-02-24 DIAGNOSIS — Z8546 Personal history of malignant neoplasm of prostate: Secondary | ICD-10-CM | POA: Diagnosis not present

## 2019-02-24 DIAGNOSIS — M1990 Unspecified osteoarthritis, unspecified site: Secondary | ICD-10-CM | POA: Diagnosis not present

## 2019-02-24 DIAGNOSIS — K219 Gastro-esophageal reflux disease without esophagitis: Secondary | ICD-10-CM | POA: Diagnosis not present

## 2019-02-24 DIAGNOSIS — I10 Essential (primary) hypertension: Secondary | ICD-10-CM | POA: Diagnosis not present

## 2019-02-24 DIAGNOSIS — I2699 Other pulmonary embolism without acute cor pulmonale: Secondary | ICD-10-CM | POA: Diagnosis not present

## 2019-02-24 DIAGNOSIS — C61 Malignant neoplasm of prostate: Secondary | ICD-10-CM | POA: Diagnosis not present

## 2019-03-03 DIAGNOSIS — M25561 Pain in right knee: Secondary | ICD-10-CM | POA: Diagnosis not present

## 2019-03-10 DIAGNOSIS — I2699 Other pulmonary embolism without acute cor pulmonale: Secondary | ICD-10-CM | POA: Diagnosis not present

## 2019-03-10 DIAGNOSIS — Z7901 Long term (current) use of anticoagulants: Secondary | ICD-10-CM | POA: Diagnosis not present

## 2019-03-10 DIAGNOSIS — C61 Malignant neoplasm of prostate: Secondary | ICD-10-CM | POA: Diagnosis not present

## 2019-04-12 DIAGNOSIS — D1801 Hemangioma of skin and subcutaneous tissue: Secondary | ICD-10-CM | POA: Diagnosis not present

## 2019-04-12 DIAGNOSIS — L820 Inflamed seborrheic keratosis: Secondary | ICD-10-CM | POA: Diagnosis not present

## 2019-04-12 DIAGNOSIS — L570 Actinic keratosis: Secondary | ICD-10-CM | POA: Diagnosis not present

## 2019-04-14 NOTE — Progress Notes (Signed)
Cardiology Office Note:    Date:  04/15/2019   ID:  Joseph Pham, DOB Nov 08, 1950, MRN [id number]  PCP:  Angelina Sheriff, MD  Cardiologist:  Shirlee More, MD    Referring MD: Angelina Sheriff, MD    ASSESSMENT:    1. 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 Essential hypertension   3. Hyperlipidemia, unspecified hyperlipidemia type   4. Chronic anticoagulation   5. Nonrheumatic aortic valve insufficiency   6. Renal artery stenosis ([place])   7. Coronary artery calcification seen on CAT scan    PLAN:    In order of problems listed above:  1. Stable recent CTA will recheck in 1 to 2 years 2. Stable BP at target continue current treatment ACE inhibitor he has normal renal function and potassium 3. Stable lipids at target continue high intensity statin with coronary and aortic atherosclerosis 4. Continue his anticoagulant 6 months, he has been seen by hematology personally I would favor low-dose anticoagulation afterwards with his family history and unprovoked pulmonary embolism 5. Mild consider repeat echocardiogram in a year 6. Normal renal function blood pressure controlled at this time I would not advocate angiography or intervention 7. Continue good blood pressure control statin and I do not think he requires further cardiac imaging at this time    Next appointment: 12 months   Medication Adjustments/Labs and Tests Ordered: Current medicines are reviewed at length with the patient today.  Concerns regarding medicines are outlined above.  No orders of the defined types were placed in this encounter.  No orders of the defined types were placed in this encounter.   No chief complaint on file.   History of Present Illness:    Joseph Pham is a 68 y.o. male with a hx of  enlargement of the ascending thoracic aorta 24  in Nov 2018, Dyslipidemia, HTN, and Type 2 DM  last seen 04/13/2018. Compliance with diet, lifestyle and medications: Yes  The records  below with the patient and told him that I finally accessed his heart catheterization from 2012.  He was discharged from [facility] 02/04/2019 after presenting to the emergency room with increasing shortness of breath and was found to have bilateral pulmonary embolism.  He was admitted to the hospital initially treated with heparin and transitioned to Xarelto.  The venous thromboembolism was felt to be unprovoked and recommended initially for 6 months of anticoagulation and consideration of long-term treatment.  In hospital he had an echocardiogram performed showing normal right ventricular size and function left ventricular ejection fraction was 60 to 65%.  He also had mild aortic regurgitation.  CTA showed unchanged in the aneurysmal dilation of ascending aorta 42 mm and the findings of filling defect in the segmental pulmonary arteries of the right upper lobe right lower lobe without evidence of right heart strain.  He also has mild ostial narrowing of the left renal artery.  I personally reviewed his EKG 02/03/2019 sinus rhythm normal  Cardiac cath 10/15/2010:  PROCEDURE PERFORMED:  1. Left heart catheterization with selective coronary angiography.  2. Ventriculography.  3. Aortography.  DIAGNOSIS:  1. No evidence of significant epicardial flow limiting coronary artery     disease.  2. Normal left ventricular systolic function.  3. 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  SELECTIVE CORONARY ANGIOGRAPHY:  1. Left main coronary artery was short without evidence of flow limiting     disease.  2. Left anterior descending  artery was a large caliber  vessel without any     flow limiting lesions.  The diagonal branches were free of disease.  3. Left circumflex coronary artery essentially a normal vessel.  4. Right coronary artery demonstrates no significant flow limiting lesions.     The PDA and posterolateral branches were free of flow limiting lesions.  He is improved no  longer short of breath no swelling chest pain palpitation or syncope.  He had a total knee arthroplasty performed in March but no perioperative findings to suggest deep vein thrombosis he was not prophylactically anticoagulated.  Since he has been seen by hematology and evaluated for genetic for coagulability but note his mother died of recurrent venous thromboembolism.  In my opinion his pulmonary embolism was unprovoked and at the end of 6 months I would favor low-dose anticoagulation especially as he is allergic to aspirin.  He asked that I send this opinion to the hematologist and to his primary care.  Recent labs 02/03/2019 show cholesterol 153 LDL at target 71 HDL 35 A1c 6.1% Past Medical History:  Diagnosis Date  . Abnormal EKG 10/14/2015   Overview:  Poor R wave progression and abnormal T waves  . Acid reflux 03/16/2017  . 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 Aftercare following surgery 08/30/2015  . Allergy   . Arthritis HANDS AND LEFT KNEE  . 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 Cataract    forming bilaterally   . Diabetes ([place]) 03/16/2017  . Essential hypertension 10/14/2015  . GERD (gastroesophageal reflux disease)   . Hematuria   . Hemorrhoid   . History of kidney stones   . Hyperlipidemia   . Hypertension   . Kidney stone 03/16/2017  . Lumbar radiculopathy 03/16/2017  . OSA on CPAP   . Paronychia 08/16/2015  . PE (pulmonary thromboembolism) ([place]) 02/2019  . Preoperative cardiovascular examination 08/19/2016  . Prostate cancer ([place]) 03/16/2017  . Right flank pain   . Right ureteral stone   . Skin cancer   . Sleep apnea   . Type 2 diabetes, diet controlled ([place])    off Metformin > 1 yr now as of PV 02-23-18  . Ureteral calculus, right 03/15/2012  . Wears glasses     Past Surgical History:  Procedure Laterality Date  . BACK SURGERY  2019   rod and screws   . BILATERAL INGUINAL HERNIA REPAIR  1989  . CARDIAC  CATHETERIZATION  01-11-2004   NO EVIDENCE SIGNIFICANT EPICARDIAL FLOW LIMITING 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  Marland Kitchen CARPAL TUNNEL RELEASE Left 06/2016  . CARPAL TUNNEL RELEASE Right 03/2017  . COLONOSCOPY     last with [facility] unsure when   . CYSTOSCOPY WITH URETEROSCOPY AND STENT PLACEMENT Right 10/28/2013   Procedure: RIGHT URETEROSCOPY AND STENT PLACEMENT;  Surgeon: [name], MD;  Location[facility];  Service: Urology;  Laterality: Right;  . EXTRACORPOREAL SHOCK WAVE LITHOTRIPSY  02-08-2009  . HOLMIUM LASER APPLICATION Right [id number]   Procedure: HOLMIUM LASER LITHO;  Surgeon: [name], MD;  Location: [facility];  Service: Urology;  Laterality: Right;  . LEFT KNEE [place]  NOV 2011  . LEFT URETEROSCOPIC STONE EXTRACTION  02-23-2009  . POLYPECTOMY  2014   1 polyp per dr Steve Rattler notes- no surg path to determine polyps type but was a 5 yr recall   . RADIOACTIVE SEED IMPLANT  08/08/2011   Procedure: RADIOACTIVE  SEED IMPLANT;  Surgeon: [name], MD;  Location[facility];  Service: Urology;  Laterality: N/A;  seeds implanted 50 seeds found in bladder=none   . REPLACEMENT TOTAL KNEE Left 2018  . SKIN CANCER EXCISION     Burned off left side of nose, and head  . TOTAL KNEE ARTHROPLASTY Right 08/2018  . UPPER GASTROINTESTINAL ENDOSCOPY    . URETEROSCOPY  03/15/2012   Procedure: URETEROSCOPY;  Surgeon: [name], MD;  [facility];  Service: Urology;  Laterality: Right;  Right Ueteroscopy     Current Medications: Current Meds  Medication Sig  . atorvastatin (LIPITOR) 10 MG tablet Take 10 mg by mouth daily.  . cholecalciferol (VITAMIN D3) 25 MCG (1000 UT) tablet Take 1,000 Units by mouth daily.  . famotidine (PEPCID) 40 MG tablet Take 40 mg by mouth at bedtime.   . fexofenadine (ALLEGRA) 180 MG tablet Take 180 mg by mouth daily.  . fluticasone (FLONASE) 50  MCG/ACT nasal spray Place 2 sprays into the nose daily as needed for allergies.   Marland Kitchen lisinopril (PRINIVIL,ZESTRIL) 10 MG tablet Take 10 mg by mouth daily.   . Melatonin 5 MG CAPS Take 1 capsule by mouth at bedtime.   . Multiple Vitamin (MULTIVITAMIN WITH MINERALS) TABS tablet Take 1 tablet by mouth daily.  . Omega 3 1200 MG CAPS Take 4 capsules by mouth daily.  Marland Kitchen omeprazole (PRILOSEC) 20 MG capsule Take 20 mg by mouth daily.  . rivaroxaban (XARELTO) 20 MG TABS tablet Take 20 mg by mouth daily.  . traZODone (DESYREL) 100 MG tablet Take 100 mg at bedtime by mouth.     Allergies:   Aspirin and Amoxicillin   Social History   Socioeconomic History  . Marital status: Married    Spouse name: Not on file  . Number of children: Not on file  . Years of education: Not on file  . Highest education level: Not on file  Occupational History  . Not on file  Social Needs  . Financial resource strain: Not on file  . Food insecurity    Worry: Not on file    Inability: Not on file  . Transportation needs    Medical: Not on file    Non-medical: Not on file  Tobacco Use  . Smoking status: Former Smoker    Years: 10.00    Types: Cigarettes    Quit date: 07/02/1993    Years since quitting: 25.8  . Smokeless tobacco: Former Systems developer    Types: Chew  Substance and Sexual Activity  . Alcohol use: No  . Drug use: No  . Sexual activity: Not on file  Lifestyle  . Physical activity    Days per week: Not on file    Minutes per session: Not on file  . Stress: Not on file  Relationships  . Social Herbalist on phone: Not on file    Gets together: Not on file    Attends religious service: Not on file    Active member of club or organization: Not on file    Attends meetings of clubs or organizations: Not on file    Relationship status: Not on file  Other Topics Concern  . Not on file  Social History Narrative  . Not on file     Family History: The patient's family history includes CAD in  his father; Cancer in his mother; Colon polyps in his brother and sister; Diabetes in his  father and sister; Heart attack in his father. There is no history of Colon cancer, Esophageal cancer, Rectal cancer, or Stomach cancer. ROS:   Please see the history of present illness.    All other systems reviewed and are negative.  EKGs/Labs/Other Studies Reviewed:    The following studies were reviewed today:   Recent Labs: 02/04/2019: BUN 16; Creatinine, Ser 1.13; Hemoglobin 14.0; Platelets 156; Potassium 4.0; Sodium 137  Recent Lipid Panel    Component Value Date/Time   CHOL 153 02/03/2019 0130   TRIG 235 (H) 02/03/2019 0130   HDL 35 (L) 02/03/2019 0130   CHOLHDL 4.4 02/03/2019 0130   VLDL 47 (H) 02/03/2019 0130   LDLCALC 71 02/03/2019 0130    Physical Exam:    VS:  BP 130/70 (BP Location: Left Arm, Patient Position: Sitting, Cuff Size: Large)   Pulse 71   Ht 5\' 6"  (1.676 m)   Wt 264 lb 6.4 oz (119.9 kg)   SpO2 97%   BMI 42.68 kg/m     Wt Readings from Last 3 Encounters:  04/15/19 264 lb 6.4 oz (119.9 kg)  02/04/19 248 lb 12.8 oz (112.9 kg)  05/18/18 259 lb (117.5 kg)     GEN:  Well nourished, well developed in no acute distress HEENT: Normal NECK: No JVD; No carotid bruits LYMPHATICS: No lymphadenopathy CARDIAC: RRR, no murmurs, rubs, gallops RESPIRATORY:  Clear to auscultation without rales, wheezing or rhonchi  ABDOMEN: Soft, non-tender, non-distended MUSCULOSKELETAL:  No edema; No deformity  SKIN: Warm and dry NEUROLOGIC:  Alert and oriented x 3 PSYCHIATRIC:  Normal affect    Signed, Shirlee More, MD  04/15/2019 9:04 AM    [place]

## 2019-04-15 ENCOUNTER — Encounter: Payer: Self-pay | Admitting: Cardiology

## 2019-04-15 ENCOUNTER — Other Ambulatory Visit: Payer: Self-pay

## 2019-04-15 ENCOUNTER — Ambulatory Visit (INDEPENDENT_AMBULATORY_CARE_PROVIDER_SITE_OTHER): Payer: Medicare Other | Admitting: Cardiology

## 2019-04-15 VITALS — BP 130/70 | HR 71 | Ht 66.0 in | Wt 264.4 lb

## 2019-04-15 DIAGNOSIS — Z7901 Long term (current) use of anticoagulants: Secondary | ICD-10-CM

## 2019-04-15 DIAGNOSIS — I10 Essential (primary) hypertension: Secondary | ICD-10-CM

## 2019-04-15 DIAGNOSIS — I701 Atherosclerosis of renal artery: Secondary | ICD-10-CM

## 2019-04-15 DIAGNOSIS — I7789 Other specified disorders of arteries and arterioles: Secondary | ICD-10-CM | POA: Diagnosis not present

## 2019-04-15 DIAGNOSIS — I2510 Atherosclerotic heart disease of native coronary artery without angina pectoris: Secondary | ICD-10-CM

## 2019-04-15 DIAGNOSIS — I351 Nonrheumatic aortic (valve) insufficiency: Secondary | ICD-10-CM | POA: Diagnosis not present

## 2019-04-15 DIAGNOSIS — E785 Hyperlipidemia, unspecified: Secondary | ICD-10-CM | POA: Diagnosis not present

## 2019-04-15 NOTE — Patient Instructions (Signed)
Medication Instructions:  Your physician recommends that you continue on your current medications as directed. Please refer to the Current Medication list given to you today.  *If you need a refill on your cardiac medications before your next appointment, please call your pharmacy*  Lab Work: None  If you have labs (blood work) drawn today and your tests are completely normal, you will receive your results only by: Marland Kitchen MyChart Message (if you have MyChart) OR . A paper copy in the mail If you have any lab test that is abnormal or we need to change your treatment, we will call you to review the results.  Testing/Procedures: None  Follow-Up: At [facility], you and your health needs are our priority.  As part of our continuing mission to provide you with exceptional heart care, we have created designated Provider Care Teams.  These Care Teams include your primary Cardiologist (physician) and Advanced Practice Providers (APPs -  Physician Assistants and Nurse Practitioners) who all work together to provide you with the care you need, when you need it.  Your next appointment:   12 months  The format for your next appointment:   In Person  Provider:   Shirlee More, MD

## 2019-04-21 DIAGNOSIS — C61 Malignant neoplasm of prostate: Secondary | ICD-10-CM | POA: Diagnosis not present

## 2019-04-26 DIAGNOSIS — Z8546 Personal history of malignant neoplasm of prostate: Secondary | ICD-10-CM | POA: Diagnosis not present

## 2019-07-04 DIAGNOSIS — H9319 Tinnitus, unspecified ear: Secondary | ICD-10-CM | POA: Diagnosis not present

## 2019-07-04 DIAGNOSIS — H6592 Unspecified nonsuppurative otitis media, left ear: Secondary | ICD-10-CM | POA: Diagnosis not present

## 2019-07-04 DIAGNOSIS — Z6841 Body Mass Index (BMI) 40.0 and over, adult: Secondary | ICD-10-CM | POA: Diagnosis not present

## 2019-07-04 DIAGNOSIS — J029 Acute pharyngitis, unspecified: Secondary | ICD-10-CM | POA: Diagnosis not present

## 2019-07-18 DIAGNOSIS — Z9181 History of falling: Secondary | ICD-10-CM | POA: Diagnosis not present

## 2019-07-18 DIAGNOSIS — Z1331 Encounter for screening for depression: Secondary | ICD-10-CM | POA: Diagnosis not present

## 2019-07-18 DIAGNOSIS — Z6841 Body Mass Index (BMI) 40.0 and over, adult: Secondary | ICD-10-CM | POA: Diagnosis not present

## 2019-07-18 DIAGNOSIS — R7302 Impaired glucose tolerance (oral): Secondary | ICD-10-CM | POA: Diagnosis not present

## 2019-07-19 DIAGNOSIS — G4733 Obstructive sleep apnea (adult) (pediatric): Secondary | ICD-10-CM | POA: Diagnosis not present

## 2019-07-19 DIAGNOSIS — H9193 Unspecified hearing loss, bilateral: Secondary | ICD-10-CM

## 2019-07-19 DIAGNOSIS — J3501 Chronic tonsillitis: Secondary | ICD-10-CM | POA: Insufficient documentation

## 2019-07-19 DIAGNOSIS — J343 Hypertrophy of nasal turbinates: Secondary | ICD-10-CM

## 2019-07-19 DIAGNOSIS — J342 Deviated nasal septum: Secondary | ICD-10-CM

## 2019-07-19 HISTORY — DX: Unspecified hearing loss, bilateral: H91.93

## 2019-07-19 HISTORY — DX: Deviated nasal septum: J34.2

## 2019-07-19 HISTORY — DX: Hypertrophy of nasal turbinates: J34.3

## 2019-07-19 HISTORY — DX: Chronic tonsillitis: J35.01

## 2019-08-29 DIAGNOSIS — H903 Sensorineural hearing loss, bilateral: Secondary | ICD-10-CM | POA: Diagnosis not present

## 2019-08-31 DIAGNOSIS — H9193 Unspecified hearing loss, bilateral: Secondary | ICD-10-CM | POA: Diagnosis not present

## 2019-08-31 DIAGNOSIS — H9313 Tinnitus, bilateral: Secondary | ICD-10-CM

## 2019-08-31 HISTORY — DX: Tinnitus, bilateral: H93.13

## 2019-09-08 DIAGNOSIS — Z7901 Long term (current) use of anticoagulants: Secondary | ICD-10-CM | POA: Diagnosis not present

## 2019-09-15 DIAGNOSIS — C61 Malignant neoplasm of prostate: Secondary | ICD-10-CM | POA: Diagnosis not present

## 2019-09-15 DIAGNOSIS — Z86711 Personal history of pulmonary embolism: Secondary | ICD-10-CM | POA: Diagnosis not present

## 2019-09-15 DIAGNOSIS — Z7901 Long term (current) use of anticoagulants: Secondary | ICD-10-CM | POA: Diagnosis not present

## 2019-09-15 DIAGNOSIS — M19072 Primary osteoarthritis, left ankle and foot: Secondary | ICD-10-CM | POA: Diagnosis not present

## 2019-12-27 DIAGNOSIS — N200 Calculus of kidney: Secondary | ICD-10-CM | POA: Diagnosis not present

## 2019-12-27 DIAGNOSIS — Z6841 Body Mass Index (BMI) 40.0 and over, adult: Secondary | ICD-10-CM | POA: Diagnosis not present

## 2019-12-27 DIAGNOSIS — Z23 Encounter for immunization: Secondary | ICD-10-CM | POA: Diagnosis not present

## 2019-12-27 DIAGNOSIS — E7800 Pure hypercholesterolemia, unspecified: Secondary | ICD-10-CM | POA: Diagnosis not present

## 2019-12-27 DIAGNOSIS — Z0000 Encounter for general adult medical examination without abnormal findings: Secondary | ICD-10-CM | POA: Diagnosis not present

## 2019-12-27 DIAGNOSIS — R7302 Impaired glucose tolerance (oral): Secondary | ICD-10-CM | POA: Diagnosis not present

## 2019-12-27 DIAGNOSIS — Z79899 Other long term (current) drug therapy: Secondary | ICD-10-CM | POA: Diagnosis not present

## 2020-02-02 DIAGNOSIS — M76822 Posterior tibial tendinitis, left leg: Secondary | ICD-10-CM | POA: Diagnosis not present

## 2020-02-02 DIAGNOSIS — M216X1 Other acquired deformities of right foot: Secondary | ICD-10-CM | POA: Insufficient documentation

## 2020-02-02 DIAGNOSIS — M216X2 Other acquired deformities of left foot: Secondary | ICD-10-CM | POA: Diagnosis not present

## 2020-02-02 DIAGNOSIS — M76821 Posterior tibial tendinitis, right leg: Secondary | ICD-10-CM | POA: Insufficient documentation

## 2020-02-02 DIAGNOSIS — M722 Plantar fascial fibromatosis: Secondary | ICD-10-CM | POA: Diagnosis not present

## 2020-02-02 HISTORY — DX: Other acquired deformities of right foot: M21.6X1

## 2020-02-02 HISTORY — DX: Posterior tibial tendinitis, right leg: M76.821

## 2020-04-12 DIAGNOSIS — D485 Neoplasm of uncertain behavior of skin: Secondary | ICD-10-CM | POA: Diagnosis not present

## 2020-04-12 DIAGNOSIS — L820 Inflamed seborrheic keratosis: Secondary | ICD-10-CM | POA: Diagnosis not present

## 2020-04-12 DIAGNOSIS — L570 Actinic keratosis: Secondary | ICD-10-CM | POA: Diagnosis not present

## 2020-04-16 DIAGNOSIS — N201 Calculus of ureter: Secondary | ICD-10-CM | POA: Insufficient documentation

## 2020-04-16 DIAGNOSIS — R10A1 Flank pain, right side: Secondary | ICD-10-CM | POA: Insufficient documentation

## 2020-04-16 DIAGNOSIS — Z87442 Personal history of urinary calculi: Secondary | ICD-10-CM | POA: Insufficient documentation

## 2020-04-16 DIAGNOSIS — R319 Hematuria, unspecified: Secondary | ICD-10-CM | POA: Insufficient documentation

## 2020-04-16 DIAGNOSIS — K649 Unspecified hemorrhoids: Secondary | ICD-10-CM | POA: Insufficient documentation

## 2020-04-16 DIAGNOSIS — G4733 Obstructive sleep apnea (adult) (pediatric): Secondary | ICD-10-CM | POA: Insufficient documentation

## 2020-04-16 DIAGNOSIS — Z973 Presence of spectacles and contact lenses: Secondary | ICD-10-CM | POA: Insufficient documentation

## 2020-04-16 DIAGNOSIS — T7840XA Allergy, unspecified, initial encounter: Secondary | ICD-10-CM | POA: Insufficient documentation

## 2020-04-16 DIAGNOSIS — E119 Type 2 diabetes mellitus without complications: Secondary | ICD-10-CM | POA: Insufficient documentation

## 2020-04-16 DIAGNOSIS — H269 Unspecified cataract: Secondary | ICD-10-CM | POA: Insufficient documentation

## 2020-04-16 DIAGNOSIS — I10 Essential (primary) hypertension: Secondary | ICD-10-CM | POA: Insufficient documentation

## 2020-04-16 DIAGNOSIS — R109 Unspecified abdominal pain: Secondary | ICD-10-CM | POA: Insufficient documentation

## 2020-04-16 DIAGNOSIS — G4730 Sleep apnea, unspecified: Secondary | ICD-10-CM | POA: Insufficient documentation

## 2020-04-16 DIAGNOSIS — C4490 Unspecified malignant neoplasm of skin, unspecified: Secondary | ICD-10-CM | POA: Insufficient documentation

## 2020-04-18 DIAGNOSIS — N401 Enlarged prostate with lower urinary tract symptoms: Secondary | ICD-10-CM | POA: Diagnosis not present

## 2020-04-18 NOTE — Progress Notes (Addendum)
Cardiology Office Note:    Date:  04/19/2020   ID:  Joseph Pham, DOB [birth year], MRN [id number]  PCP:  Angelina Sheriff, MD  Cardiologist:  Shirlee More, MD    Referring MD: Angelina Sheriff, MD    ASSESSMENT:    1. Chronic anticoagulation   2. Essential hypertension   3. Mixed hyperlipidemia   4. Ascending aorta enlargement (HCC)   5. Nonrheumatic aortic valve insufficiency   6. Family history of abdominal aortic aneurysm    PLAN:    In order of problems listed above:  1. He had unprovoked pulmonary embolism remain on indefinite anticoagulation.  Anticipates ankle surgery after the first of the year we can withdraw his anticoagulant for 2 full days prior and usually reinstituted 2 days after I will send a copy in the care team to the surgeon he is going to say. 2. BP is at target current treatment including ACE inhibitor 3. Lipids at target continue his high intensity statin low-dose 4. Recheck CT no contrast 5. Clinically stable I do not see the need for repeat echocardiogram 6. Is a striking family history of abdominal aortic aneurysm he will have a  duplex performed   Next appointment: 1 year   Medication Adjustments/Labs and Tests Ordered: Current medicines are reviewed at length with the patient today.  Concerns regarding medicines are outlined above.  Orders Placed This Encounter  Procedures  . EKG 12-Lead  . VAS [place] AAA DUPLEX   No orders of the defined types were placed in this encounter.   Chief Complaint  Patient presents with  . Follow-up    Enlargement ascending aorta  . Hypertension  . Hyperlipidemia    History of Present Illness:    Joseph Pham is a 69 y.o. male with a hx of enlargement of ascending aorta diabetes hypertension unprovoked pulmonary embolism mild aortic insufficiency renal artery stenosis last seen 04/14/2020.  He had normal coronary arteriography 10/05/2019 maximum diameter ascending aorta 42 mm on  CT.  Compliance with diet, lifestyle and medications: Yes  He was discharged from [facility] 02/04/2019 after presenting to the emergency room with increasing shortness of breath and was found to have bilateral pulmonary embolism.  He was admitted to the hospital initially treated with heparin and transitioned to Xarelto.  The venous thromboembolism was felt to be unprovoked and recommended initially for 6 months of anticoagulation and consideration of long-term treatment.  In hospital he had an echocardiogram performed showing normal right ventricular size and function left ventricular ejection fraction was 60 to 65%.  He also had mild aortic regurgitation.  CTA showed unchanged in the aneurysmal dilation of ascending aorta 42 mm and the findings of filling defect in the segmental pulmonary arteries of the right upper lobe right lower lobe without evidence of right heart strain.  He also has mild ostial narrowing of the left renal artery.  Noncontrast CTA 2019 November maximum diameter 43 mm.  He has had a good year no chest pain shortness of breath palpitation or syncope and has had 3 doses of mRNA COVID-19 vaccine.  He is due for follow-up CT noncontrast the chest regarding large and ascending aorta is a striking family history of abdominal aortic aneurysm with death and will do a screening duplex in our office.  Laboratory studies from his PCPs office 12/27/2019 showed lipids at target cholesterol 122 LDL 54 triglycerides 175 HDL 33 A1c normal 6.2% and creatinine normal 1.10 Past Medical History:  Diagnosis Date  . Abnormal EKG 10/14/2015   Overview:  Poor R wave progression and abnormal T waves  . Acid reflux 03/16/2017  . 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 Aftercare following surgery 08/30/2015  . Allergy   . Arthritis HANDS AND LEFT KNEE  . 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 Cataract    forming bilaterally   . Diabetes  ([place]) 03/16/2017  . Essential hypertension 10/14/2015  . GERD (gastroesophageal reflux disease)   . Hematuria   . Hemorrhoid   . History of kidney stones   . Hyperlipidemia   . Hypertension   . Kidney stone 03/16/2017  . Lumbar radiculopathy 03/16/2017  . OSA (obstructive sleep apnea)   . OSA on CPAP   . Paronychia 08/16/2015  . PE (pulmonary thromboembolism) ([place]) 02/2019  . Preoperative cardiovascular examination 08/19/2016  . Prostate cancer ([place]) 03/16/2017  . Right flank pain   . Right ureteral stone   . Skin cancer   . Sleep apnea   . Type 2 diabetes, diet controlled ([place])    off Metformin > 1 yr now as of PV 02-23-18  . Ureteral calculus, right 03/15/2012  . Wears glasses     Past Surgical History:  Procedure Laterality Date  . BACK SURGERY  2019   rod and screws   . BILATERAL INGUINAL HERNIA REPAIR  1989  . 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  Marland Kitchen CARPAL TUNNEL RELEASE Left 06/2016  . CARPAL TUNNEL RELEASE Right 03/2017  . COLONOSCOPY     last with [facility] unsure when   . CYSTOSCOPY WITH URETEROSCOPY AND STENT PLACEMENT Right 10/28/2013   Procedure: RIGHT URETEROSCOPY AND STENT PLACEMENT;  Surgeon: [name], MD;  Location[facility];  Service: Urology;  Laterality: Right;  . EXTRACORPOREAL SHOCK WAVE LITHOTRIPSY  02-08-2009  . HOLMIUM LASER APPLICATION Right 1/93/7902   Procedure: HOLMIUM LASER LITHO;  Surgeon: [name], MD;  Location[facility];  Service: Urology;  Laterality: Right;  . LEFT KNEE [place]  NOV 2011  . LEFT URETEROSCOPIC STONE EXTRACTION  02-23-2009  . POLYPECTOMY  2014   1 polyp per dr Steve Rattler notes- no surg path to determine polyps type but was a 5 yr recall   . RADIOACTIVE SEED IMPLANT  08/08/2011   Procedure: RADIOACTIVE SEED IMPLANT;  Surgeon: [name], MD;  Location: [facility];  Service: Urology;  Laterality: N/A;  seeds implanted 50 seeds found in bladder=none   . REPLACEMENT TOTAL KNEE Left 2018  . SKIN CANCER EXCISION     Burned off left side of nose, and head  . TOTAL KNEE ARTHROPLASTY Right 08/2018  . UPPER GASTROINTESTINAL ENDOSCOPY    . URETEROSCOPY  03/15/2012   Procedure: URETEROSCOPY;  Surgeon: [name], MD;  Location: [facility];  Service: Urology;  Laterality: Right;  Right Ueteroscopy     Current Medications: Current Meds  Medication Sig  . atorvastatin (LIPITOR) 10 MG tablet Take 10 mg by mouth daily.  . cholecalciferol (VITAMIN D3) 25 MCG (1000 UT) tablet Take 1,000 Units by mouth daily.  . famotidine (PEPCID) 40 MG tablet Take 40 mg by mouth at bedtime.   . fexofenadine (ALLEGRA) 180 MG tablet Take 180 mg by mouth daily.  . fluticasone (FLONASE) 50 MCG/ACT nasal spray  Place 2 sprays into the nose daily as needed for allergies.   Marland Kitchen lisinopril (PRINIVIL,ZESTRIL) 10 MG tablet Take 10 mg by mouth daily.   . Melatonin 5 MG CAPS Take 1 capsule by mouth at bedtime.   . Omega 3 1200 MG CAPS Take 4 capsules by mouth daily.  Marland Kitchen omeprazole (PRILOSEC) 20 MG capsule Take 20 mg by mouth daily.  . rivaroxaban (XARELTO) 20 MG TABS tablet Take 20 mg by mouth daily.  . traZODone (DESYREL) 100 MG tablet Take 100 mg at bedtime by mouth.     Allergies:   Aspirin and Amoxicillin   Social History   Socioeconomic History  . Marital status: Married    Spouse name: Not on file  . Number of children: Not on file  . Years of education: Not on file  . Highest education level: Not on file  Occupational History  . Not on file  Tobacco Use  . Smoking status: Former Smoker    Years: 10.00    Types: Cigarettes    Quit date: 07/02/1993    Years since quitting: 26.8  . Smokeless tobacco: Former Systems developer    Types: Secondary school teacher  . Vaping Use: Never used  Substance and Sexual Activity  . Alcohol use: No  . Drug use: No  .  Sexual activity: Not on file  Other Topics Concern  . Not on file  Social History Narrative  . Not on file   Social Determinants of Health   Financial Resource Strain:   . Difficulty of Paying Living Expenses: Not on file  Food Insecurity:   . Worried About Charity fundraiser in the Last Year: Not on file  . Ran Out of Food in the Last Year: Not on file  Transportation Needs:   . Lack of Transportation (Medical): Not on file  . Lack of Transportation (Non-Medical): Not on file  Physical Activity:   . Days of Exercise per Week: Not on file  . Minutes of Exercise per Session: Not on file  Stress:   . Feeling of Stress : Not on file  Social Connections:   . Frequency of Communication with Friends and Family: Not on file  . Frequency of Social Gatherings with Friends and Family: Not on file  . Attends Religious Services: Not on file  . Active Member of Clubs or Organizations: Not on file  . Attends Archivist Meetings: Not on file  . Marital Status: Not on file     Family History: The patient's family history includes CAD in his father; Cancer in his mother; Colon polyps in his brother and sister; Diabetes in his father and sister; Heart attack in his father. There is no history of Colon cancer, Esophageal cancer, Rectal cancer, or Stomach cancer. ROS:   Please see the history of present illness.    All other systems reviewed and are negative.  EKGs/Labs/Other Studies Reviewed:    The following studies were reviewed today:  EKG:  EKG ordered today and personally reviewed.  The ekg ordered today demonstrates sinus rhythm normal EKG  Recent Labs: No results found for requested labs within last 8760 hours.  Recent Lipid Panel    Component Value Date/Time   CHOL 153 02/03/2019 0130   TRIG 235 (H) 02/03/2019 0130   HDL 35 (L) 02/03/2019 0130   CHOLHDL 4.4 02/03/2019 0130   VLDL 47 (H) 02/03/2019 0130   LDLCALC 71 02/03/2019 0130    Physical Exam:  VS:  BP  128/67   Pulse 70   Ht 5\' 6"  (1.676 m)   Wt 267 lb 9.6 oz (121.4 kg)   SpO2 97%   BMI 43.19 kg/m     Wt Readings from Last 3 Encounters:  04/19/20 267 lb 9.6 oz (121.4 kg)  04/15/19 264 lb 6.4 oz (119.9 kg)  02/04/19 248 lb 12.8 oz (112.9 kg)     GEN: Obese well nourished, well developed in no acute distress HEENT: Normal NECK: No JVD; No carotid bruits LYMPHATICS: No lymphadenopathy CARDIAC: RRR, no murmurs, rubs, gallops RESPIRATORY:  Clear to auscultation without rales, wheezing or rhonchi  ABDOMEN: Soft, non-tender, non-distended MUSCULOSKELETAL:  No edema; No deformity  SKIN: Warm and dry NEUROLOGIC:  Alert and oriented x 3 PSYCHIATRIC:  Normal affect    Signed, Shirlee More, MD  04/19/2020 11:38 AM    [place]

## 2020-04-19 ENCOUNTER — Other Ambulatory Visit: Payer: Self-pay

## 2020-04-19 ENCOUNTER — Telehealth: Payer: Self-pay | Admitting: Cardiology

## 2020-04-19 ENCOUNTER — Ambulatory Visit (INDEPENDENT_AMBULATORY_CARE_PROVIDER_SITE_OTHER): Payer: Medicare Other | Admitting: Cardiology

## 2020-04-19 ENCOUNTER — Encounter: Payer: Self-pay | Admitting: Cardiology

## 2020-04-19 VITALS — BP 128/67 | HR 70 | Ht 66.0 in | Wt 267.6 lb

## 2020-04-19 DIAGNOSIS — Z7901 Long term (current) use of anticoagulants: Secondary | ICD-10-CM | POA: Diagnosis not present

## 2020-04-19 DIAGNOSIS — E782 Mixed hyperlipidemia: Secondary | ICD-10-CM | POA: Diagnosis not present

## 2020-04-19 DIAGNOSIS — R931 Abnormal findings on diagnostic imaging of heart and coronary circulation: Secondary | ICD-10-CM

## 2020-04-19 DIAGNOSIS — I351 Nonrheumatic aortic (valve) insufficiency: Secondary | ICD-10-CM

## 2020-04-19 DIAGNOSIS — Z8249 Family history of ischemic heart disease and other diseases of the circulatory system: Secondary | ICD-10-CM

## 2020-04-19 DIAGNOSIS — I7789 Other specified disorders of arteries and arterioles: Secondary | ICD-10-CM | POA: Diagnosis not present

## 2020-04-19 DIAGNOSIS — I10 Essential (primary) hypertension: Secondary | ICD-10-CM

## 2020-04-19 NOTE — Telephone Encounter (Signed)
Left message for pt to call office for CT appt details at [facility].

## 2020-04-19 NOTE — Patient Instructions (Signed)
Medication Instructions:  Your physician recommends that you continue on your current medications as directed. Please refer to the Current Medication list given to you today.  *If you need a refill on your cardiac medications before your next appointment, please call your pharmacy*   Lab Work: None If you have labs (blood work) drawn today and your tests are completely normal, you will receive your results only by: Marland Kitchen MyChart Message (if you have MyChart) OR . A paper copy in the mail If you have any lab test that is abnormal or we need to change your treatment, we will call you to review the results.   Testing/Procedures: Your physician has requested that you have an abdominal aorta duplex. During this test, an ultrasound is used to evaluate the aorta. Allow 30 minutes for this exam. Do not eat after midnight the day before and avoid carbonated beverages  We have put in the order for you to have a Chest CT without contrast. We will call you once this is scheduled.    Follow-Up: At [facility], you and your health needs are our priority.  As part of our continuing mission to provide you with exceptional heart care, we have created designated Provider Care Teams.  These Care Teams include your primary Cardiologist (physician) and Advanced Practice Providers (APPs -  Physician Assistants and Nurse Practitioners) who all work together to provide you with the care you need, when you need it.  We recommend signing up for the patient portal called "MyChart".  Sign up information is provided on this After Visit Summary.  MyChart is used to connect with patients for Virtual Visits (Telemedicine).  Patients are able to view lab/test results, encounter notes, upcoming appointments, etc.  Non-urgent messages can be sent to your provider as well.   To learn more about what you can do with MyChart, go to [email or web address].    Your next appointment:   1 year(s)  The format for your next  appointment:   In Person  Provider:   Shirlee More, MD   Other Instructions

## 2020-04-24 DIAGNOSIS — I700 Atherosclerosis of aorta: Secondary | ICD-10-CM | POA: Diagnosis not present

## 2020-04-24 DIAGNOSIS — I2510 Atherosclerotic heart disease of native coronary artery without angina pectoris: Secondary | ICD-10-CM | POA: Diagnosis not present

## 2020-04-24 DIAGNOSIS — I712 Thoracic aortic aneurysm, without rupture: Secondary | ICD-10-CM | POA: Diagnosis not present

## 2020-04-24 DIAGNOSIS — I358 Other nonrheumatic aortic valve disorders: Secondary | ICD-10-CM | POA: Diagnosis not present

## 2020-04-25 DIAGNOSIS — R310 Gross hematuria: Secondary | ICD-10-CM | POA: Diagnosis not present

## 2020-04-25 DIAGNOSIS — Z8546 Personal history of malignant neoplasm of prostate: Secondary | ICD-10-CM | POA: Diagnosis not present

## 2020-05-03 DIAGNOSIS — E7800 Pure hypercholesterolemia, unspecified: Secondary | ICD-10-CM | POA: Diagnosis not present

## 2020-05-03 DIAGNOSIS — Z6841 Body Mass Index (BMI) 40.0 and over, adult: Secondary | ICD-10-CM | POA: Diagnosis not present

## 2020-05-03 DIAGNOSIS — R7302 Impaired glucose tolerance (oral): Secondary | ICD-10-CM | POA: Diagnosis not present

## 2020-05-04 DIAGNOSIS — R310 Gross hematuria: Secondary | ICD-10-CM | POA: Diagnosis not present

## 2020-05-04 DIAGNOSIS — R319 Hematuria, unspecified: Secondary | ICD-10-CM | POA: Diagnosis not present

## 2020-05-04 DIAGNOSIS — N200 Calculus of kidney: Secondary | ICD-10-CM | POA: Diagnosis not present

## 2020-05-04 DIAGNOSIS — N3289 Other specified disorders of bladder: Secondary | ICD-10-CM | POA: Diagnosis not present

## 2020-05-04 DIAGNOSIS — Z8546 Personal history of malignant neoplasm of prostate: Secondary | ICD-10-CM | POA: Diagnosis not present

## 2020-05-09 DIAGNOSIS — M67961 Unspecified disorder of synovium and tendon, right lower leg: Secondary | ICD-10-CM | POA: Diagnosis not present

## 2020-05-09 DIAGNOSIS — M79672 Pain in left foot: Secondary | ICD-10-CM | POA: Diagnosis not present

## 2020-05-09 DIAGNOSIS — M67962 Unspecified disorder of synovium and tendon, left lower leg: Secondary | ICD-10-CM | POA: Diagnosis not present

## 2020-05-09 DIAGNOSIS — M79671 Pain in right foot: Secondary | ICD-10-CM | POA: Diagnosis not present

## 2020-05-17 DIAGNOSIS — M25571 Pain in right ankle and joints of right foot: Secondary | ICD-10-CM | POA: Diagnosis not present

## 2020-05-17 DIAGNOSIS — M25572 Pain in left ankle and joints of left foot: Secondary | ICD-10-CM | POA: Diagnosis not present

## 2020-05-17 DIAGNOSIS — M6281 Muscle weakness (generalized): Secondary | ICD-10-CM | POA: Diagnosis not present

## 2020-05-21 ENCOUNTER — Telehealth: Payer: Self-pay

## 2020-05-21 ENCOUNTER — Other Ambulatory Visit: Payer: Self-pay

## 2020-05-21 ENCOUNTER — Ambulatory Visit (INDEPENDENT_AMBULATORY_CARE_PROVIDER_SITE_OTHER): Payer: Medicare Other

## 2020-05-21 DIAGNOSIS — I7789 Other specified disorders of arteries and arterioles: Secondary | ICD-10-CM

## 2020-05-21 NOTE — Telephone Encounter (Signed)
-----   Message from Richardo Priest, MD sent at 05/21/2020 12:04 PM EST ----- Good result  No aneurysm

## 2020-05-21 NOTE — Telephone Encounter (Signed)
Results informed to the patient and he verbalized understanding.

## 2020-05-21 NOTE — Progress Notes (Signed)
Abdominal aortic duplex exam performed.  Jimmy Esmirna Ravan RDCS, RVT 

## 2020-05-21 NOTE — Telephone Encounter (Signed)
Left a message to return my call.

## 2020-05-22 DIAGNOSIS — M25571 Pain in right ankle and joints of right foot: Secondary | ICD-10-CM | POA: Diagnosis not present

## 2020-05-22 DIAGNOSIS — M6281 Muscle weakness (generalized): Secondary | ICD-10-CM | POA: Diagnosis not present

## 2020-05-22 DIAGNOSIS — M25572 Pain in left ankle and joints of left foot: Secondary | ICD-10-CM | POA: Diagnosis not present

## 2020-05-23 DIAGNOSIS — N200 Calculus of kidney: Secondary | ICD-10-CM | POA: Diagnosis not present

## 2020-05-23 DIAGNOSIS — R310 Gross hematuria: Secondary | ICD-10-CM | POA: Diagnosis not present

## 2020-05-23 DIAGNOSIS — Z8546 Personal history of malignant neoplasm of prostate: Secondary | ICD-10-CM | POA: Diagnosis not present

## 2020-05-24 DIAGNOSIS — M25571 Pain in right ankle and joints of right foot: Secondary | ICD-10-CM | POA: Diagnosis not present

## 2020-05-24 DIAGNOSIS — M6281 Muscle weakness (generalized): Secondary | ICD-10-CM | POA: Diagnosis not present

## 2020-05-24 DIAGNOSIS — M25572 Pain in left ankle and joints of left foot: Secondary | ICD-10-CM | POA: Diagnosis not present

## 2020-05-28 DIAGNOSIS — M25571 Pain in right ankle and joints of right foot: Secondary | ICD-10-CM | POA: Diagnosis not present

## 2020-05-28 DIAGNOSIS — M25572 Pain in left ankle and joints of left foot: Secondary | ICD-10-CM | POA: Diagnosis not present

## 2020-05-28 DIAGNOSIS — M6281 Muscle weakness (generalized): Secondary | ICD-10-CM | POA: Diagnosis not present

## 2020-05-30 DIAGNOSIS — M25572 Pain in left ankle and joints of left foot: Secondary | ICD-10-CM | POA: Diagnosis not present

## 2020-05-30 DIAGNOSIS — M25571 Pain in right ankle and joints of right foot: Secondary | ICD-10-CM | POA: Diagnosis not present

## 2020-05-30 DIAGNOSIS — M6281 Muscle weakness (generalized): Secondary | ICD-10-CM | POA: Diagnosis not present

## 2020-06-06 DIAGNOSIS — M25572 Pain in left ankle and joints of left foot: Secondary | ICD-10-CM | POA: Diagnosis not present

## 2020-06-06 DIAGNOSIS — M25571 Pain in right ankle and joints of right foot: Secondary | ICD-10-CM | POA: Diagnosis not present

## 2020-06-06 DIAGNOSIS — M6281 Muscle weakness (generalized): Secondary | ICD-10-CM | POA: Diagnosis not present

## 2020-06-07 DIAGNOSIS — M25572 Pain in left ankle and joints of left foot: Secondary | ICD-10-CM | POA: Diagnosis not present

## 2020-06-07 DIAGNOSIS — M25571 Pain in right ankle and joints of right foot: Secondary | ICD-10-CM | POA: Diagnosis not present

## 2020-06-07 DIAGNOSIS — M6281 Muscle weakness (generalized): Secondary | ICD-10-CM | POA: Diagnosis not present

## 2020-06-11 DIAGNOSIS — M6281 Muscle weakness (generalized): Secondary | ICD-10-CM | POA: Diagnosis not present

## 2020-06-11 DIAGNOSIS — M25571 Pain in right ankle and joints of right foot: Secondary | ICD-10-CM | POA: Diagnosis not present

## 2020-06-11 DIAGNOSIS — M25572 Pain in left ankle and joints of left foot: Secondary | ICD-10-CM | POA: Diagnosis not present

## 2020-06-14 DIAGNOSIS — M6281 Muscle weakness (generalized): Secondary | ICD-10-CM | POA: Diagnosis not present

## 2020-06-14 DIAGNOSIS — M25571 Pain in right ankle and joints of right foot: Secondary | ICD-10-CM | POA: Diagnosis not present

## 2020-06-14 DIAGNOSIS — M25572 Pain in left ankle and joints of left foot: Secondary | ICD-10-CM | POA: Diagnosis not present

## 2020-06-20 DIAGNOSIS — M67962 Unspecified disorder of synovium and tendon, left lower leg: Secondary | ICD-10-CM | POA: Diagnosis not present

## 2020-06-20 DIAGNOSIS — M67961 Unspecified disorder of synovium and tendon, right lower leg: Secondary | ICD-10-CM | POA: Diagnosis not present

## 2020-06-21 DIAGNOSIS — M25571 Pain in right ankle and joints of right foot: Secondary | ICD-10-CM | POA: Diagnosis not present

## 2020-06-21 DIAGNOSIS — M25572 Pain in left ankle and joints of left foot: Secondary | ICD-10-CM | POA: Diagnosis not present

## 2020-06-21 DIAGNOSIS — M6281 Muscle weakness (generalized): Secondary | ICD-10-CM | POA: Diagnosis not present

## 2020-08-01 DIAGNOSIS — M67962 Unspecified disorder of synovium and tendon, left lower leg: Secondary | ICD-10-CM | POA: Diagnosis not present

## 2020-08-01 DIAGNOSIS — M67961 Unspecified disorder of synovium and tendon, right lower leg: Secondary | ICD-10-CM | POA: Diagnosis not present

## 2020-09-03 DIAGNOSIS — N200 Calculus of kidney: Secondary | ICD-10-CM | POA: Diagnosis not present

## 2020-09-04 ENCOUNTER — Other Ambulatory Visit: Payer: Self-pay | Admitting: Urology

## 2020-09-04 ENCOUNTER — Encounter (HOSPITAL_COMMUNITY): Payer: Self-pay | Admitting: Urology

## 2020-09-04 ENCOUNTER — Other Ambulatory Visit: Payer: Self-pay

## 2020-09-04 NOTE — Progress Notes (Addendum)
COVID Vaccine Completed: Yes  Date COVID Vaccine completed: x2 Has received booster: Yes COVID vaccine manufacturer:   Moderna    Date of COVID positive in last 90 days: No  PCP - Jacqlyn Larsen II, MD Cardiologist - Dr. Shirlee More last office visit 04/19/2020 in epic  Chest x-ray - greater than 1 year in epic EKG - 04/19/2020 in epic Stress Test - greater than 2 years in epic ECHO - 02/03/2019 in epic Cardiac Cath - greater than 2 years Pacemaker/ICD device last checked: N/A  Sleep Study - Yes  CPAP - Yes  Fasting Blood Sugar - 120-130's Checks Blood Sugar ___1__ times a day  Blood Thinner Instructions: Remain on anticoagulants per Dr. Milford Cage Aspirin Instructions: N/A Last Dose:N/A   Activity level:  Able to exercise without symptoms     Anesthesia review: Ascending aorta enlargement, Nonrheumatic aortic valve insufficiency, hypertension,  PE, diabetic  Patient denies shortness of breath, fever, cough and chest pain at PAT appointment   Patient verbalized understanding of instructions that were given to them at the PAT appointment. Patient was also instructed that they will need to review over the PAT instructions again at home before surgery.

## 2020-09-05 MED ORDER — SODIUM CHLORIDE 0.9 % IV SOLN
2.0000 g | INTRAVENOUS | Status: AC
  Administered 2020-09-06: 2 g via INTRAVENOUS
  Filled 2020-09-05: qty 2

## 2020-09-05 NOTE — H&P (Signed)
had prostate cancer treated.  HPI: Joseph Pham is a 70 year-old male established patient who is here for prostate cancer which has been treated.   His PSA at his time of diagnosis was 5.53. His cancer was T2a, Gleason score 4+3 = 7 in 1 core, 3+4 = 7 in 4 cores and 3+3 = 6 in 3 cores. His most recent PSA is <0.015. His PSA results have been low since his prostate cancer treatment.   He received radiation therapy for his cancer. He was treated with Brachytherapy for his cancer. His radiation treatment was complete 08/08/2011. He has not undergone Hormonal Therapy for treatment. He does not have urinary incontinence.   He has not seen blood in his stool since the biopsy. He is not having new bone pain. He has not recently had unwanted weight loss. This condition would be considered of mild to moderate severity with no modifying factors or associated signs or symptoms other than as noted above.   Adenocarcinoma of the prostate: His PSA in 10/11 was 5.9. A repeat PSA in 5/12 was noted to be 5.53 with a free to total ratio of 5%. He was also noted on DRE to have induration in the right apex of the prostate. Prostate vol. - 26.6cc.  Path. - adenocarcinoma Gleason score 4+3 = 7 in 1 core, 3+4 = 7 in 4 cores and 3+3 = 6 in 3 cores for a total of 8/12 cores positive.  Clinical stage T2a.  Treatment: I-125 seed implant on 08/08/11.   04/20/18: He reports to me that since I have seen him last he has had 04/26/19: He returns today for annual follow-up of prostate cancer treated with I 125 seeds in 3/13. He has been doing well over the past year from urologic standpoint with no new urologic complaints. In addition he has not had any weight loss or unexplained bone pain. Did have a PE and is currently on Xarelto. His PSA went from undetectable to just slightly detectable last year but I rechecked it and it had returned to undetectable.   -04/25/20-patient with history of adenocarcinoma the prostate status post  interstitial brachytherapy in March 2013. PSAs have been undetectable last PSA was undetectable on 04/18/2020. Here for follow-up. No interim GU issues except that he had an episode of dark urine earlier this morning. Has had no previous episodes of hematuria.  Micro urinalysis today shows greater than 60 RBCs  -05/23/20-patient with history of adenocarcinoma the prostate status post interstitial brachytherapy in March 2013 as above. Has had some recent gross hematuria and has had CT renal scan in the interim on 05/04/2020. This is reviewed and shows bilateral small probable renal cyst but also has significant stone burden in the left kidney with several stones largest being 1.3 cm stone left renal pelvis area. They are nonobstructing. No significant stones noted on the right. Here for cysto to assess bladder. Micro urinalysis today is clear on urine spun sediment.  Cysto performed today and shows: Normal pendulous prostatic urethral area. Bladder was inspected show no mucosal lesions.  -09/03/20-patient with history of adenocarcinoma the prostate status post interstitial brachytherapy in March 2013 as above also has had recent hematuria felt secondary to probable left renal calculi noted on CT scan in December of 2021. We managed conservatively. He presents today with     ALLERGIES: Aspirin TABS Trimox CAPS    MEDICATIONS: Allegra 60 mg capsule Oral  Atorvastatin Calcium 10 mg tablet Oral  Famotidine 40 mg tablet  Fish Oil CAPS Oral  Lisinopril 10 mg tablet Oral  Melatonin 5 mg capsule Oral  Metformin Hcl 500 mg tablet Oral  Multivitamin/Iron TABS Oral  Omega 3  Osteo Bi-Flex Regular Strength TABS Oral  Ranitidine Hcl 300 mg capsule  Rosuvastatin Calcium 10 mg tablet  Trazodone Hcl 100 mg tablet  Vitamin B12  Vitamin C  Xarelto 20 mg tablet     GU PSH: Cysto Uretero Balloon Dil Strict - 2015 Cysto Uretero Lithotripsy - 2015, 2013, 2010 Cystoscopy - 05/23/2020, 2017 Cystoscopy Insert  Stent - 2015, 2013, 2010 ESWL - 2015, 2010 Locm 300-399Mg /Ml Iodine,1Ml - 05/04/2020 Prostate Needle Biopsy - 2012       PSH Notes: Lithotripsy, Cystoscopy With Ureteroscopy With Lithotripsy, Cystourethroscopy W/ Ureteroscopy W/ Tx Of Ureteral Strict, Cystoscopy With Insertion Of Ureteral Stent Right, Cystoscopy With Ureteroscopy With Lithotripsy, Cystoscopy With Insertion Of Ureteral Stent Right, Radiation Therapy, Biopsy Of The Prostate Needle, Cystoscopy With Pyeloscopy With Lithotripsy, Lithotripsy, Cystoscopy With Insertion Of Ureteral Stent Left, Hernia Repair, Knee Surgery   NON-GU PSH: Carpal Tunnel Surgery.Marland Kitchen Hernia Repair - 2010 Knee replacement, Left, 08/2018     GU PMH: Gross hematuria - 05/23/2020, - 05/04/2020, - 04/25/2020 (Stable), I have reassured the patient that with no abnormality of the upper tract and no abnormality of the bladder that his hematuria is almost certainly coming from the thinned superficial blood vessels of his prostatic urethra secondary to the effects of radiation. I did not recommend any form of surgical or pharmacologic therapy but if he continues to have recurrent bleeding he will contact me. Otherwise I have reassured him. He will return to see me as previously scheduled for routine prostate cancer follow-up in one year., - 2017, - 2017 History of prostate cancer - 05/23/2020, - 04/25/2020 (Stable), His prostate remains smooth and benign. His PSA is undetectable and therefore he will return again in a year for DRE and PSA., - 04/26/2019 (Stable), His prostate was completely benign to exam but his PSA has increased slightly from where was a year ago. It still remains exceedingly low. I told him because of this I wanted to recheck the PSA again in 6 months and then will see him back again in 12 months for another PSA and DRE at that time., - 04/20/2018 (Stable), His prostate remains benign to exam with a PSA that remains exceedingly low. I have therefore  recommended continued yearly DRE and PSA., - 2018 (Stable), Prostate is flat and smooth. In addition his PSA remains exceedingly low. I told him that this likely indicates complete care. I will continue to monitor him on a yearly basis with DRE and PSA., - 2017 Renal calculus (Stable) - 05/23/2020, - 2017 (Stable), Left, His left renal calculus remained stable with no evidence of metabolic stone activity. It sounds as if he may of passed a small stone recently. That would account for the fact that I didn't see a calcification last year that was not present on his KUB within the left kidney this year., - 2017, Bilateral kidney stones, - 2015 Flank Pain - 2017 Nocturnal Enuresis, Nocturnal enuresis - 2017 ED due to arterial insufficiency, Erectile dysfunction due to arterial insufficiency - 2016 Prostate Cancer, Adenocarcinoma of prostate - 2016 Prostate Stones, Calculus of prostate - 2016 Residual Hemorrhoid Tags, External hemorrhoids - 2015 Male ED, unspecified, Erectile dysfunction - 2015 Hydronephrosis Unspec, Hydronephrosis On The Left - 2014      PMH Notes: Nephrolithiasis: He underwent left ureteroscopy and laser lithotripsy of  a left UPJ stone as well as removal of stones in the lower pole of his left kidney in 9/10.  He had lithotripsy of the stone previously without fragmentation.  Right ureteroscopy and laser lithotripsy of a UPJ stone in10/13.  Right ureteroscopy and laser lithotripsy in 5/15.  ESWL 7/15 - right renal pelvis.  Stone analysis: 100% calcium oxalate  Stone risk: Normal serum studies. 24 hour urine showed hypocitraturia and low urine volume.   Erectile dysfunction: He developed erectile dysfunction which was likely secondary to both a combination of his diabetes but more likely from the effects of his prostate radiation.  Current therapy: Viagra     NON-GU PMH: Stenosis of anus and rectum, Rectal/anal stenosis - 2015 Encounter for general adult medical examination  without abnormal findings, Encounter for preventive health examination - 2015 Personal history of other endocrine, nutritional and metabolic disease, History of type 2 diabetes mellitus - 2015    FAMILY HISTORY: No pertinent family history - Other   SOCIAL HISTORY: Marital Status: Married Preferred Language: English; Ethnicity: Not Hispanic Or Latino; Race: White Current Smoking Status: Patient does not smoke anymore.  Does drink.  Drinks 1 caffeinated drink per day.     Notes: Former smoker, Alcohol Use, Tobacco Use, Occupation:, Caffeine Use, Marital History - Currently Married   REVIEW OF SYSTEMS:    GU Review Male:   Patient denies frequent urination, hard to postpone urination, burning/ pain with urination, get up at night to urinate, leakage of urine, stream starts and stops, trouble starting your stream, have to strain to urinate , erection problems, and penile pain.  Gastrointestinal (Upper):   Patient denies nausea, vomiting, and indigestion/ heartburn.  Gastrointestinal (Lower):   Patient denies diarrhea and constipation.  Constitutional:   Patient denies fever, night sweats, weight loss, and fatigue.  Skin:   Patient denies skin rash/ lesion and itching.  Eyes:   Patient denies blurred vision and double vision.  Ears/ Nose/ Throat:   Patient denies sore throat and sinus problems.  Hematologic/Lymphatic:   Patient denies swollen glands and easy bruising.  Cardiovascular:   Patient denies leg swelling and chest pains.  Respiratory:   Patient denies cough and shortness of breath.  Endocrine:   Patient denies excessive thirst.  Musculoskeletal:   Patient denies back pain and joint pain.  Neurological:   Patient denies headaches and dizziness.  Psychologic:   Patient denies depression and anxiety.   VITAL SIGNS:      09/03/2020 02:20 PM  Weight 268 lb / 121.56 kg  Height 66 in / 167.64 cm  BP 133/83 mmHg  Pulse 75 /min  Temperature 97.5 F / 36.3 C  BMI 43.3 kg/m    MULTI-SYSTEM PHYSICAL EXAMINATION:    Constitutional: Obese. No physical deformities. Normally developed. Good grooming.   Neck: Neck symmetrical, not swollen. Normal tracheal position.  Respiratory: No labored breathing, no use of accessory muscles.   Cardiovascular: Normal temperature, normal extremity pulses, no swelling, no varicosities.  Lymphatic: No enlargement of neck, axillae, groin.  Skin: No paleness, no jaundice, no cyanosis. No lesion, no ulcer, no rash.  Neurologic / Psychiatric: Oriented to time, oriented to place, oriented to person. No depression, no anxiety, no agitation.  Gastrointestinal: Positive mild-to-moderate left CVA tenderness  Eyes: Normal conjunctivae. Normal eyelids.  Ears, Nose, Mouth, and Throat: Left ear no scars, no lesions, no masses. Right ear no scars, no lesions, no masses. Nose no scars, no lesions, no masses. Normal hearing. Normal lips.  Musculoskeletal: Normal gait and station of head and neck.     Complexity of Data:  Source Of History:  Patient  Records Review:   Previous Doctor Records, Previous Hospital Records, Previous Patient Records  X-Ray Review: KUB: Reviewed Films. Reviewed Report. Discussed With Patient.     04/18/20 04/25/19 10/19/18 04/14/18 04/16/17 04/21/16 04/24/15 05/02/14  PSA  Total PSA <0.015 ng/mL <0.015 ng/mL <0.015 ng/mL 0.12 ng/mL <0.015 ng/mL 0.021 ng/dl 0.04  0.09     PROCEDURES:         KUB - 74018  A single view of the abdomen is obtained.      Patient confirmed No Neulasta OnPro Device. KUB is reviewed today. Shows hardware in the back area from prior surgical procedure. There is a calcification measuring about 7 x 7 mm in size at the region of L3-4 on the left consistent with probable ureteral calculus. There is another stone that is 1.3 cm in size overlying the left renal pelvis area. Brachytherapy seeds also noted.      ASSESSMENT:      ICD-10 Details  1 GU:   Flank Pain - C94.49 Acute, Complicated  Injury  2   Renal calculus - Q75.9 Acute, Complicated Injury  3   Ureteral calculus - F63.8 Acute, Complicated Injury   PLAN:            Medications New Meds: Tamsulosin Hcl 0.4 mg capsule 1 capsule PO Q HS   #30  1 Refill(s)  Oxycodone-Acetaminophen 5 mg-325 mg tablet 1 tablet PO Q 6 H PRN   #15  0 Refill(s)            Orders X-Rays: KUB          Schedule         Document Letter(s):  Created for Patient: Clinical Summary         Notes:   I discussed the KUB findings with the patient. Recommended cysto and left JJ stent. We may attempt left ureteroscopy if ureter looks dilated enough to access with laser lithotripsy. Risks and benefits discussed as outlined below. Will schedule accordingly in the near future. I have given him prescription for Percocet to take in the interim and will initiate tamsulosin 0.4 mg daily. He will call us if he has worsening nausea vomiting pain or fever. I have recommended retrograde pyelogram, ureteroscopic stone manipulation with laser lithotripsy.  I have discussed in detail the risks, benefits and alternatives of ureteroscopic stone extraction to include but not limited to:  Bleeding, infection, ureteral perforation with need for open repair, inability to place the stent necessitating the need for further procedures, possible percutaneous nephrostomy tube placement, discomfort from the stents, hematuria, urgency, frequency and refractory problems after the stent is removed.  I discussed the stent is not a permanent stent and will require a followup for stent removal or stent exchange.  The patient knows there is high risk for ureteral stent incrustation if this is not removed or exchanged within 3 months.  Patient voices understanding of the risks and benefits of the procedure and consents to the procedure.

## 2020-09-06 ENCOUNTER — Ambulatory Visit (HOSPITAL_COMMUNITY)
Admission: RE | Admit: 2020-09-06 | Discharge: 2020-09-06 | Disposition: A | Discharge status: Home / Self Care | Payer: Medicare Other | Attending: Urology | Admitting: Urology

## 2020-09-06 ENCOUNTER — Encounter (HOSPITAL_COMMUNITY): Payer: Self-pay | Admitting: Urology

## 2020-09-06 ENCOUNTER — Ambulatory Visit (HOSPITAL_COMMUNITY): Payer: Medicare Other

## 2020-09-06 ENCOUNTER — Ambulatory Visit (HOSPITAL_COMMUNITY): Payer: Medicare Other | Admitting: Physician Assistant

## 2020-09-06 ENCOUNTER — Encounter (HOSPITAL_COMMUNITY)
Admission: RE | Disposition: A | Discharge status: Home / Self Care | Payer: Self-pay | Source: Home / Self Care | Attending: Urology

## 2020-09-06 DIAGNOSIS — Z880 Allergy status to penicillin: Secondary | ICD-10-CM | POA: Diagnosis not present

## 2020-09-06 DIAGNOSIS — I129 Hypertensive chronic kidney disease with stage 1 through stage 4 chronic kidney disease, or unspecified chronic kidney disease: Secondary | ICD-10-CM | POA: Diagnosis not present

## 2020-09-06 DIAGNOSIS — Z87891 Personal history of nicotine dependence: Secondary | ICD-10-CM | POA: Insufficient documentation

## 2020-09-06 DIAGNOSIS — Z20822 Contact with and (suspected) exposure to covid-19: Secondary | ICD-10-CM | POA: Diagnosis not present

## 2020-09-06 DIAGNOSIS — N182 Chronic kidney disease, stage 2 (mild): Secondary | ICD-10-CM | POA: Diagnosis not present

## 2020-09-06 DIAGNOSIS — G4733 Obstructive sleep apnea (adult) (pediatric): Secondary | ICD-10-CM | POA: Diagnosis not present

## 2020-09-06 DIAGNOSIS — Z96652 Presence of left artificial knee joint: Secondary | ICD-10-CM | POA: Insufficient documentation

## 2020-09-06 DIAGNOSIS — N201 Calculus of ureter: Secondary | ICD-10-CM | POA: Diagnosis not present

## 2020-09-06 DIAGNOSIS — Z8546 Personal history of malignant neoplasm of prostate: Secondary | ICD-10-CM | POA: Diagnosis not present

## 2020-09-06 DIAGNOSIS — Z79899 Other long term (current) drug therapy: Secondary | ICD-10-CM | POA: Insufficient documentation

## 2020-09-06 DIAGNOSIS — Z7901 Long term (current) use of anticoagulants: Secondary | ICD-10-CM | POA: Insufficient documentation

## 2020-09-06 DIAGNOSIS — K219 Gastro-esophageal reflux disease without esophagitis: Secondary | ICD-10-CM | POA: Diagnosis not present

## 2020-09-06 DIAGNOSIS — Z7984 Long term (current) use of oral hypoglycemic drugs: Secondary | ICD-10-CM | POA: Diagnosis not present

## 2020-09-06 DIAGNOSIS — Z886 Allergy status to analgesic agent status: Secondary | ICD-10-CM | POA: Insufficient documentation

## 2020-09-06 DIAGNOSIS — E1122 Type 2 diabetes mellitus with diabetic chronic kidney disease: Secondary | ICD-10-CM | POA: Diagnosis not present

## 2020-09-06 HISTORY — PX: CYSTOSCOPY/URETEROSCOPY/HOLMIUM LASER/STENT PLACEMENT: SHX6546

## 2020-09-06 HISTORY — DX: Other complications of anesthesia, initial encounter: T88.59XA

## 2020-09-06 LAB — CBC
HCT: 39.5 % (ref 39.0–52.0)
Hemoglobin: 13.7 g/dL (ref 13.0–17.0)
MCH: 31.2 pg (ref 26.0–34.0)
MCHC: 34.7 g/dL (ref 30.0–36.0)
MCV: 90 fL (ref 80.0–100.0)
Platelets: 180 10*3/uL (ref 150–400)
RBC: 4.39 MIL/uL (ref 4.22–5.81)
RDW: 12.8 % (ref 11.5–15.5)
WBC: 7 10*3/uL (ref 4.0–10.5)
nRBC: 0 % (ref 0.0–0.2)

## 2020-09-06 LAB — BASIC METABOLIC PANEL
Anion gap: 12 (ref 5–15)
BUN: 29 mg/dL — ABNORMAL HIGH (ref 8–23)
CO2: 21 mmol/L — ABNORMAL LOW (ref 22–32)
Calcium: 8.9 mg/dL (ref 8.9–10.3)
Chloride: 101 mmol/L (ref 98–111)
Creatinine, Ser: 2.32 mg/dL — ABNORMAL HIGH (ref 0.61–1.24)
GFR, Estimated: 30 mL/min — ABNORMAL LOW (ref >60)
Glucose, Bld: 129 mg/dL — ABNORMAL HIGH (ref 70–99)
Potassium: 4.9 mmol/L (ref 3.5–5.1)
Sodium: 134 mmol/L — ABNORMAL LOW (ref 135–145)

## 2020-09-06 LAB — HEMOGLOBIN A1C
Hgb A1c MFr Bld: 5.8 % — ABNORMAL HIGH (ref 4.8–5.6)
Mean Plasma Glucose: 119.76 mg/dL

## 2020-09-06 LAB — GLUCOSE, CAPILLARY
Glucose-Capillary: 130 mg/dL — ABNORMAL HIGH (ref 70–99)
Glucose-Capillary: 131 mg/dL — ABNORMAL HIGH (ref 70–99)

## 2020-09-06 LAB — SARS CORONAVIRUS 2 BY RT PCR (HOSPITAL ORDER, PERFORMED IN ~~LOC~~ HOSPITAL LAB): SARS Coronavirus 2: NEGATIVE

## 2020-09-06 SURGERY — CYSTOSCOPY/URETEROSCOPY/HOLMIUM LASER/STENT PLACEMENT
Anesthesia: General | Laterality: Left

## 2020-09-06 MED ORDER — ONDANSETRON HCL 4 MG/2ML IJ SOLN
4.0000 mg | Freq: Once | INTRAMUSCULAR | Status: DC | PRN
Start: 2020-09-06 — End: 2020-09-06

## 2020-09-06 MED ORDER — PROPOFOL 10 MG/ML IV BOLUS
INTRAVENOUS | Status: DC | PRN
  Administered 2020-09-06: 150 mg via INTRAVENOUS

## 2020-09-06 MED ORDER — EPHEDRINE SULFATE 50 MG/ML IJ SOLN
INTRAMUSCULAR | Status: DC | PRN
  Administered 2020-09-06 (×3): 10 mg via INTRAVENOUS

## 2020-09-06 MED ORDER — FENTANYL CITRATE (PF) 100 MCG/2ML IJ SOLN
INTRAMUSCULAR | Status: AC
  Filled 2020-09-06: qty 2

## 2020-09-06 MED ORDER — DEXAMETHASONE SODIUM PHOSPHATE 4 MG/ML IJ SOLN
INTRAMUSCULAR | Status: DC | PRN
  Administered 2020-09-06: 4 mg via INTRAVENOUS

## 2020-09-06 MED ORDER — GLYCOPYRROLATE 0.2 MG/ML IJ SOLN
INTRAMUSCULAR | Status: DC | PRN
  Administered 2020-09-06: .1 mg via INTRAVENOUS
  Administered 2020-09-06: .2 mg via INTRAVENOUS

## 2020-09-06 MED ORDER — LACTATED RINGERS IV SOLN: INTRAVENOUS | Status: DC

## 2020-09-06 MED ORDER — GLYCOPYRROLATE PF 0.2 MG/ML IJ SOSY
PREFILLED_SYRINGE | INTRAMUSCULAR | Status: AC
  Filled 2020-09-06: qty 2

## 2020-09-06 MED ORDER — FENTANYL CITRATE (PF) 100 MCG/2ML IJ SOLN
INTRAMUSCULAR | Status: DC | PRN
  Administered 2020-09-06 (×3): 25 ug via INTRAVENOUS
  Administered 2020-09-06: 50 ug via INTRAVENOUS
  Administered 2020-09-06 (×3): 25 ug via INTRAVENOUS

## 2020-09-06 MED ORDER — CHLORHEXIDINE GLUCONATE 0.12 % MT SOLN
15.0000 mL | Freq: Once | OROMUCOSAL | Status: AC
  Administered 2020-09-06: 15 mL via OROMUCOSAL

## 2020-09-06 MED ORDER — OXYCODONE HCL 5 MG PO TABS
5.0000 mg | ORAL_TABLET | Freq: Once | ORAL | Status: DC | PRN
Start: 2020-09-06 — End: 2020-09-06

## 2020-09-06 MED ORDER — HYDROMORPHONE HCL 1 MG/ML IJ SOLN: 0.2500 mg | INTRAMUSCULAR | Status: DC | PRN

## 2020-09-06 MED ORDER — IOHEXOL 300 MG/ML  SOLN
INTRAMUSCULAR | Status: DC | PRN
  Administered 2020-09-06: 15 mL via URETHRAL

## 2020-09-06 MED ORDER — TAMSULOSIN HCL 0.4 MG PO CAPS: 0.4000 mg | ORAL_CAPSULE | Freq: Every day | ORAL | 0 refills | Status: DC

## 2020-09-06 MED ORDER — ORAL CARE MOUTH RINSE: 15.0000 mL | Freq: Once | OROMUCOSAL | Status: AC

## 2020-09-06 MED ORDER — LEVOFLOXACIN 500 MG PO TABS: 500.0000 mg | ORAL_TABLET | Freq: Every day | ORAL | 0 refills | Status: AC

## 2020-09-06 MED ORDER — OXYCODONE HCL 5 MG/5ML PO SOLN: 5.0000 mg | Freq: Once | ORAL | Status: DC | PRN

## 2020-09-06 MED ORDER — LIDOCAINE 2% (20 MG/ML) 5 ML SYRINGE
INTRAMUSCULAR | Status: DC | PRN
  Administered 2020-09-06: 80 mg via INTRAVENOUS

## 2020-09-06 MED ORDER — ONDANSETRON HCL 4 MG/2ML IJ SOLN
INTRAMUSCULAR | Status: DC | PRN
  Administered 2020-09-06: 4 mg via INTRAVENOUS

## 2020-09-06 MED ORDER — STERILE WATER FOR IRRIGATION IR SOLN
Status: DC | PRN
  Administered 2020-09-06: 3000 mL

## 2020-09-06 MED ORDER — EPHEDRINE 5 MG/ML INJ
INTRAVENOUS | Status: AC
  Filled 2020-09-06: qty 10

## 2020-09-06 SURGICAL SUPPLY — 20 items
BAG URO CATCHER STRL LF (MISCELLANEOUS) ×2 IMPLANT
BULB IRRIG PATHFIND (MISCELLANEOUS) ×2 IMPLANT
CATH URET 5FR 28IN OPEN ENDED (CATHETERS) ×2 IMPLANT
EXTRACTOR STONE 1.7FRX115CM (UROLOGICAL SUPPLIES) ×2 IMPLANT
FIBER LASER TRAC TIP (UROLOGICAL SUPPLIES) ×2 IMPLANT
GLOVE SURG ENC TEXT LTX SZ7.5 (GLOVE) ×2 IMPLANT
GOWN STRL REUS W/TWL XL LVL3 (GOWN DISPOSABLE) ×2 IMPLANT
GUIDEWIRE STR DUAL SENSOR (WIRE) ×2 IMPLANT
HOVERMATT SINGLE USE (MISCELLANEOUS) ×2 IMPLANT
IV NS 1000ML (IV SOLUTION) ×2
IV NS 1000ML BAXH (IV SOLUTION) ×1 IMPLANT
KIT TURNOVER KIT A (KITS) ×2 IMPLANT
MANIFOLD NEPTUNE II (INSTRUMENTS) ×2 IMPLANT
PACK CYSTO (CUSTOM PROCEDURE TRAY) ×2 IMPLANT
SHEATH PEELAWAY SET 9 (SHEATH) ×2 IMPLANT
SHEATH URETERAL 12FRX35CM (MISCELLANEOUS) ×2 IMPLANT
SYR 20ML LL LF (SYRINGE) ×2 IMPLANT
TRACTIP FLEXIVA PULSE ID 200 (Laser) ×2 IMPLANT
TUBING CONNECTING 10 (TUBING) ×2 IMPLANT
TUBING UROLOGY SET (TUBING) ×2 IMPLANT

## 2020-09-06 NOTE — Interval H&P Note (Signed)
History and Physical Interval Note:  09/06/2020 9:38 AM  Joseph Pham  has presented today for surgery, with the diagnosis of LEFT URETERAL CALCULUS.  The various methods of treatment have been discussed with the patient and family. After consideration of risks, benefits and other options for treatment, the patient has consented to  Procedure(s) with comments: CYSTOSCOPY/POSSIBLE URETEROSCOPY/HOLMIUM LASER/STENT PLACEMENT (Left) - 30 MINS as a surgical intervention.  The patient's history has been reviewed, patient examined, no change in status, stable for surgery.  I have reviewed the patient's chart and labs.  Questions were answered to the patient's satisfaction.     Remi Haggard

## 2020-09-06 NOTE — Anesthesia Postprocedure Evaluation (Signed)
Anesthesia Post Note  Patient: Joseph Pham  Procedure(s) Performed: CYSTOSCOPY/POSSIBLE URETEROSCOPY/HOLMIUM LASER/STENT PLACEMENT (Left )     Patient location during evaluation: PACU Anesthesia Type: General Level of consciousness: awake and alert and oriented Pain management: pain level controlled Vital Signs Assessment: post-procedure vital signs reviewed and stable Respiratory status: spontaneous breathing, nonlabored ventilation and respiratory function stable Cardiovascular status: blood pressure returned to baseline and stable Postop Assessment: no apparent nausea or vomiting Anesthetic complications: no   No complications documented.  Last Vitals:  Vitals:   09/06/20 1300 09/06/20 1306  BP: (!) 153/87 (!) 146/84  Pulse: 84 85  Resp: 16 20  Temp: 36.6 C   SpO2: 92% 93%    Last Pain:  Vitals:   09/06/20 1306  TempSrc:   PainSc: 0-No pain                 Jerron Niblack A.

## 2020-09-06 NOTE — Op Note (Signed)
Preoperative diagnosis:  1.  Left ureteral calculus  Postoperative diagnosis: 1.  Left ureteral calculus  Procedure(s): 1.  Cystoscopy, left retrograde pyelogram with intraoperative interpretation, left ureteroscopy with holmium laser lithotripsy, stone extraction insertion of left JJ stent  Surgeon: Dr. Harold Barban  Anesthesia: General  Complications: None  EBL: Minimal  Specimens: Kidney stone  Disposition of specimens: Stones with the patient  Intraoperative findings: Approximate 7 to 8 mm left mid ureteral calculus at L3-4 level.  Stone was fragmented and fragments extracted.  Some bloody cloudy urine noted after removal of the stone from left ureter.  6 [place] by 26 cm soft contour stent placed  Indication: Patient is a 70 year old white male with history of stones.  Presented with acute onset left-sided flank pain 2 days ago.  Was noted to have probable 7 mm left mid ureteral calculus and larger left renal calculus.  Presents this time to undergo cystoscopy possible left ureteroscopy and insertion of left JJ stent  Description of procedure:  After obtaining form consent the patient was taken the major cystoscopy suite placed under general anesthesia.  Placed in the dorsolithotomy position genitalia prepped and draped in usual sterile fashion.  Proper pause and timeout was performed for site of procedure.  25 Fransico was advanced in the bladder prostatic urethra appeared patent.  Left ureteral orifice was easily identified near the bladder neck.  Remainder the bladder appeared grossly normal.  A retrograde pyelogram was performed with 5 French open tip catheter and confirmed filling defect in the left mid ureter at region of L3-4.  A sensor wire was subsequently passed up the left ureter manipulated to the left renal pelvis without difficulty.  The distal ureter appeared somewhat open and it was felt that high ureteroscopy could be attempted.  A 13-15 French ureteral access  sheath was subsequently passed over the guidewire into the distal ureter first passing the obturator and then the obturator and access sheath without resistance.  The long semirigid single channel ureteroscope was then passed alongside the guidewire and was able to manipulated up to the level of the stone stone was somewhat impacted in the ureter.  The 200 m holmium laser fiber subsequently utilized at a rate of 0.8 and 8 to fragment the stone into smaller pieces.  The stone was fairly hard but fragmented into small pieces.  Once the stone had been completely fragmented the an engage basket was utilized to extract the larger pieces with separate passes of the ureteroscope.  Smaller pieces irrigated through the access sheath and out.  Final ureter ureteroscopic view revealed no remaining stone fragments up to the level of the stone lodgment site and proximally.  Retrograde pyelogram through the ureteroscope revealed good ureteral integrity without evidence of extravasation.  Guidewire was already in place having been previously placed and the access sheath and ureteroscope were removed leaving the guidewire in place.  The wire was then back fed through the cystoscope and a 6 [place] by 26 cm Percuflex plus soft Contour stent was placed leaving a proximal coil in the renal pelvis and a distal coil in the bladder.  There was brisk flow of mildly bloody and cloudy urine through and around the stent noted.  Bladder was emptied and nylon suture was left on the distal end of the stent and left protruding just inside the urethral meatus to facilitate future removal.  Procedure was terminated he was awakened from anesthesia and taken back to the recovery in stable condition.  No immediate  complication from the procedure.

## 2020-09-06 NOTE — Anesthesia Procedure Notes (Signed)
Procedure Name: LMA Insertion Date/Time: 09/06/2020 11:25 AM Performed by: Lavina Hamman, CRNA Pre-anesthesia Checklist: Patient identified, Emergency Drugs available, Suction available and Patient being monitored Patient Re-evaluated:Patient Re-evaluated prior to induction Oxygen Delivery Method: Circle System Utilized Preoxygenation: Pre-oxygenation with 100% oxygen Induction Type: IV induction Ventilation: Mask ventilation without difficulty LMA: LMA inserted LMA Size: 4.0 Number of attempts: 1 Airway Equipment and Method: Bite block Placement Confirmation: positive ETCO2 Tube secured with: Tape Dental Injury: Teeth and Oropharynx as per pre-operative assessment

## 2020-09-06 NOTE — Anesthesia Preprocedure Evaluation (Addendum)
Anesthesia Evaluation  Patient identified by MRN, date of birth, ID band Patient awake    Reviewed: Allergy & Precautions, NPO status , Patient's Chart, lab work & pertinent test results, reviewed documented beta blocker date and time   History of Anesthesia Complications (+) history of anesthetic complications  Airway Mallampati: II  TM Distance: >3 FB Neck ROM: Full    Dental  (+) Dental Advisory Given, Partial Upper,    Pulmonary sleep apnea and Continuous Positive Airway Pressure Ventilation , former smoker,    Pulmonary exam normal breath sounds clear to auscultation       Cardiovascular hypertension, Pt. on medications Normal cardiovascular exam Rhythm:Regular Rate:Normal  EKG 04/19/20 NSR, Normal  Echo 02/03/19 1. The left ventricle has normal systolic function with an ejection fraction of 60-65%. The cavity size was mildly dilated. Left ventricular diastolic Doppler parameters are consistent with impaired relaxation.  2. The right ventricle has normal systolic function. The cavity was normal.  3. The mitral valve is grossly normal.  4. The tricuspid valve is grossly normal.  5. The aortic valve is tricuspid. Mild thickening of the aortic valve. Aortic valve regurgitation is mild by color flow Doppler. No stenosis of the aortic valve.  6. The aorta is normal unless otherwise noted.  7. Technically difficult; definity used; mild to moderate global reduction in LV systolic function (EF 45); sclerotic aortic valve with mild AI.    Neuro/Psych  Neuromuscular disease negative psych ROS   GI/Hepatic Neg liver ROS, GERD  Medicated and Controlled,  Endo/Other  diabetes, Well Controlled, Type 2, Oral Hypoglycemic AgentsMorbid obesityHyperlipidemia  Renal/GU Renal diseaseLeft ureteral calculus   Hx/o prostate Ca S/P Seed placement    Musculoskeletal  (+) Arthritis , Osteoarthritis,  Lumbar radiculopathy    Abdominal (+) + obese,   Peds  Hematology negative hematology ROS (+) Xarelto therapy- last dose this am   Anesthesia Other Findings   Reproductive/Obstetrics                          Anesthesia Physical Anesthesia Plan  ASA: III  Anesthesia Plan: General   Post-op Pain Management:    Induction: Intravenous  PONV Risk Score and Plan: 4 or greater and Treatment may vary due to age or medical condition and Ondansetron  Airway Management Planned: LMA  Additional Equipment:   Intra-op Plan:   Post-operative Plan: Extubation in OR  Informed Consent: I have reviewed the patients History and Physical, chart, labs and discussed the procedure including the risks, benefits and alternatives for the proposed anesthesia with the patient or authorized representative who has indicated his/her understanding and acceptance.     Dental advisory given  Plan Discussed with: CRNA and Anesthesiologist  Anesthesia Plan Comments:         Anesthesia Quick Evaluation

## 2020-09-06 NOTE — Transfer of Care (Signed)
Immediate Anesthesia Transfer of Care Note  Patient: Joseph Pham  Procedure(s) Performed: Procedure(s) with comments: CYSTOSCOPY/POSSIBLE URETEROSCOPY/HOLMIUM LASER/STENT PLACEMENT (Left) - 30 MINS  Patient Location: PACU  Anesthesia Type:General  Level of Consciousness:  sedated, patient cooperative and responds to stimulation  Airway & Oxygen Therapy:Patient Spontanous Breathing and Patient connected to face mask oxgen  Post-op Assessment:  Report given to PACU RN and Post -op Vital signs reviewed and stable  Post vital signs:  Reviewed and stable  Last Vitals:  Vitals:   09/06/20 0814  BP: 126/75  Pulse: 68  Resp: 18  Temp: 36.8 C  SpO2: 67%    Complications: No apparent anesthesia complications

## 2020-09-07 ENCOUNTER — Encounter (HOSPITAL_COMMUNITY): Payer: Self-pay | Admitting: Urology

## 2020-09-13 DIAGNOSIS — N201 Calculus of ureter: Secondary | ICD-10-CM | POA: Diagnosis not present

## 2020-10-01 DIAGNOSIS — E1129 Type 2 diabetes mellitus with other diabetic kidney complication: Secondary | ICD-10-CM | POA: Diagnosis not present

## 2020-10-01 DIAGNOSIS — Z6841 Body Mass Index (BMI) 40.0 and over, adult: Secondary | ICD-10-CM | POA: Diagnosis not present

## 2020-10-01 DIAGNOSIS — Z9181 History of falling: Secondary | ICD-10-CM | POA: Diagnosis not present

## 2020-10-01 DIAGNOSIS — Z1331 Encounter for screening for depression: Secondary | ICD-10-CM | POA: Diagnosis not present

## 2020-10-16 DIAGNOSIS — H9313 Tinnitus, bilateral: Secondary | ICD-10-CM | POA: Diagnosis not present

## 2020-10-16 DIAGNOSIS — G4733 Obstructive sleep apnea (adult) (pediatric): Secondary | ICD-10-CM | POA: Diagnosis not present

## 2020-10-16 DIAGNOSIS — H903 Sensorineural hearing loss, bilateral: Secondary | ICD-10-CM | POA: Diagnosis not present

## 2020-10-16 DIAGNOSIS — H9193 Unspecified hearing loss, bilateral: Secondary | ICD-10-CM | POA: Diagnosis not present

## 2020-10-16 DIAGNOSIS — J3501 Chronic tonsillitis: Secondary | ICD-10-CM | POA: Diagnosis not present

## 2020-11-21 DIAGNOSIS — N200 Calculus of kidney: Secondary | ICD-10-CM | POA: Diagnosis not present

## 2020-11-21 DIAGNOSIS — Z8546 Personal history of malignant neoplasm of prostate: Secondary | ICD-10-CM | POA: Diagnosis not present

## 2020-12-12 DIAGNOSIS — Z20822 Contact with and (suspected) exposure to covid-19: Secondary | ICD-10-CM | POA: Diagnosis not present

## 2021-01-07 DIAGNOSIS — R7302 Impaired glucose tolerance (oral): Secondary | ICD-10-CM | POA: Diagnosis not present

## 2021-01-07 DIAGNOSIS — Z79899 Other long term (current) drug therapy: Secondary | ICD-10-CM | POA: Diagnosis not present

## 2021-01-07 DIAGNOSIS — Z0000 Encounter for general adult medical examination without abnormal findings: Secondary | ICD-10-CM | POA: Diagnosis not present

## 2021-01-07 DIAGNOSIS — G4733 Obstructive sleep apnea (adult) (pediatric): Secondary | ICD-10-CM | POA: Diagnosis not present

## 2021-01-07 DIAGNOSIS — Z6841 Body Mass Index (BMI) 40.0 and over, adult: Secondary | ICD-10-CM | POA: Diagnosis not present

## 2021-01-07 DIAGNOSIS — E7800 Pure hypercholesterolemia, unspecified: Secondary | ICD-10-CM | POA: Diagnosis not present

## 2021-01-26 ENCOUNTER — Emergency Department (HOSPITAL_COMMUNITY): Payer: Medicare Other

## 2021-01-26 ENCOUNTER — Emergency Department (HOSPITAL_COMMUNITY)
Admission: EM | Admit: 2021-01-26 | Discharge: 2021-01-27 | Disposition: A | Discharge status: Home / Self Care | Payer: Medicare Other | Attending: Emergency Medicine | Admitting: Emergency Medicine

## 2021-01-26 DIAGNOSIS — Z7984 Long term (current) use of oral hypoglycemic drugs: Secondary | ICD-10-CM | POA: Insufficient documentation

## 2021-01-26 DIAGNOSIS — Z8546 Personal history of malignant neoplasm of prostate: Secondary | ICD-10-CM | POA: Insufficient documentation

## 2021-01-26 DIAGNOSIS — Z96653 Presence of artificial knee joint, bilateral: Secondary | ICD-10-CM | POA: Insufficient documentation

## 2021-01-26 DIAGNOSIS — J9811 Atelectasis: Secondary | ICD-10-CM | POA: Diagnosis not present

## 2021-01-26 DIAGNOSIS — R609 Edema, unspecified: Secondary | ICD-10-CM | POA: Diagnosis not present

## 2021-01-26 DIAGNOSIS — I700 Atherosclerosis of aorta: Secondary | ICD-10-CM | POA: Diagnosis not present

## 2021-01-26 DIAGNOSIS — Z79899 Other long term (current) drug therapy: Secondary | ICD-10-CM | POA: Diagnosis not present

## 2021-01-26 DIAGNOSIS — Z87891 Personal history of nicotine dependence: Secondary | ICD-10-CM | POA: Insufficient documentation

## 2021-01-26 DIAGNOSIS — R0789 Other chest pain: Secondary | ICD-10-CM | POA: Diagnosis not present

## 2021-01-26 DIAGNOSIS — S0990XA Unspecified injury of head, initial encounter: Secondary | ICD-10-CM | POA: Insufficient documentation

## 2021-01-26 DIAGNOSIS — Y900 Blood alcohol level of less than 20 mg/100 ml: Secondary | ICD-10-CM | POA: Insufficient documentation

## 2021-01-26 DIAGNOSIS — W11XXXA Fall on and from ladder, initial encounter: Secondary | ICD-10-CM | POA: Diagnosis not present

## 2021-01-26 DIAGNOSIS — E1169 Type 2 diabetes mellitus with other specified complication: Secondary | ICD-10-CM | POA: Diagnosis not present

## 2021-01-26 DIAGNOSIS — S199XXA Unspecified injury of neck, initial encounter: Secondary | ICD-10-CM | POA: Diagnosis not present

## 2021-01-26 DIAGNOSIS — Z20822 Contact with and (suspected) exposure to covid-19: Secondary | ICD-10-CM | POA: Diagnosis not present

## 2021-01-26 DIAGNOSIS — M542 Cervicalgia: Secondary | ICD-10-CM | POA: Insufficient documentation

## 2021-01-26 DIAGNOSIS — T1490XA Injury, unspecified, initial encounter: Secondary | ICD-10-CM

## 2021-01-26 DIAGNOSIS — E785 Hyperlipidemia, unspecified: Secondary | ICD-10-CM | POA: Insufficient documentation

## 2021-01-26 DIAGNOSIS — Z85828 Personal history of other malignant neoplasm of skin: Secondary | ICD-10-CM | POA: Insufficient documentation

## 2021-01-26 DIAGNOSIS — S8991XA Unspecified injury of right lower leg, initial encounter: Secondary | ICD-10-CM | POA: Diagnosis not present

## 2021-01-26 DIAGNOSIS — Z043 Encounter for examination and observation following other accident: Secondary | ICD-10-CM | POA: Diagnosis not present

## 2021-01-26 DIAGNOSIS — W19XXXA Unspecified fall, initial encounter: Secondary | ICD-10-CM | POA: Diagnosis not present

## 2021-01-26 DIAGNOSIS — I10 Essential (primary) hypertension: Secondary | ICD-10-CM | POA: Diagnosis not present

## 2021-01-26 DIAGNOSIS — R079 Chest pain, unspecified: Secondary | ICD-10-CM | POA: Diagnosis not present

## 2021-01-26 DIAGNOSIS — R0902 Hypoxemia: Secondary | ICD-10-CM | POA: Diagnosis not present

## 2021-01-26 LAB — CBC
HCT: 39.9 % (ref 39.0–52.0)
Hemoglobin: 13.7 g/dL (ref 13.0–17.0)
MCH: 30.6 pg (ref 26.0–34.0)
MCHC: 34.3 g/dL (ref 30.0–36.0)
MCV: 89.3 fL (ref 80.0–100.0)
Platelets: 159 10*3/uL (ref 150–400)
RBC: 4.47 MIL/uL (ref 4.22–5.81)
RDW: 12.8 % (ref 11.5–15.5)
WBC: 6.5 10*3/uL (ref 4.0–10.5)
nRBC: 0 % (ref 0.0–0.2)

## 2021-01-26 LAB — COMPREHENSIVE METABOLIC PANEL
ALT: 27 U/L (ref 0–44)
AST: 23 U/L (ref 15–41)
Albumin: 3.7 g/dL (ref 3.5–5.0)
Alkaline Phosphatase: 38 U/L (ref 38–126)
Anion gap: 8 (ref 5–15)
BUN: 21 mg/dL (ref 8–23)
CO2: 24 mmol/L (ref 22–32)
Calcium: 9.2 mg/dL (ref 8.9–10.3)
Chloride: 106 mmol/L (ref 98–111)
Creatinine, Ser: 1.75 mg/dL — ABNORMAL HIGH (ref 0.61–1.24)
GFR, Estimated: 42 mL/min — ABNORMAL LOW (ref >60)
Glucose, Bld: 137 mg/dL — ABNORMAL HIGH (ref 70–99)
Potassium: 4 mmol/L (ref 3.5–5.1)
Sodium: 138 mmol/L (ref 135–145)
Total Bilirubin: 0.8 mg/dL (ref 0.3–1.2)
Total Protein: 6.6 g/dL (ref 6.5–8.1)

## 2021-01-26 LAB — I-STAT CHEM 8, ED
BUN: 25 mg/dL — ABNORMAL HIGH (ref 8–23)
Calcium, Ion: 1.12 mmol/L — ABNORMAL LOW (ref 1.15–1.40)
Chloride: 105 mmol/L (ref 98–111)
Creatinine, Ser: 1.8 mg/dL — ABNORMAL HIGH (ref 0.61–1.24)
Glucose, Bld: 128 mg/dL — ABNORMAL HIGH (ref 70–99)
HCT: 41 % (ref 39.0–52.0)
Hemoglobin: 13.9 g/dL (ref 13.0–17.0)
Potassium: 3.9 mmol/L (ref 3.5–5.1)
Sodium: 139 mmol/L (ref 135–145)
TCO2: 26 mmol/L (ref 22–32)

## 2021-01-26 LAB — PROTIME-INR
INR: 1.5 — ABNORMAL HIGH (ref 0.8–1.2)
Prothrombin Time: 17.9 seconds — ABNORMAL HIGH (ref 11.4–15.2)

## 2021-01-26 LAB — SAMPLE TO BLOOD BANK

## 2021-01-26 LAB — ETHANOL: Alcohol, Ethyl (B): <10 mg/dL (ref <10)

## 2021-01-26 LAB — LACTIC ACID, PLASMA: Lactic Acid, Venous: 1.7 mmol/L (ref 0.5–1.9)

## 2021-01-26 MED ORDER — IOHEXOL 350 MG/ML SOLN
75.0000 mL | Freq: Once | INTRAVENOUS | Status: AC | PRN
  Administered 2021-01-26: 75 mL via INTRAVENOUS

## 2021-01-26 NOTE — ED Provider Notes (Signed)
[facility] EMERGENCY DEPARTMENT Provider Note   CSN: [id number] Arrival date & time: 01/26/21  2229     History No chief complaint on file.   Joseph Pham is a 70 y.o. male.  The history is provided by the patient and medical records. No language interpreter was used.  Trauma Mechanism of injury: Fall Injury location: head/neck and torso Injury location detail: head   Fall:      Fall occurred: from a ladder  EMS/PTA data:      Ambulatory at scene: yes      Loss of consciousness: no  Current symptoms:      Associated symptoms:            Reports chest pain, headache and neck pain.            Denies abdominal pain, back pain, loss of consciousness, nausea and vomiting.   Relevant PMH:      Pharmacological risk factors:            Anticoagulation therapy.      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   Overview:  43 mm ascending, 11/22/15   Cataract    forming bilaterally    Complication of anesthesia    low heart rate during lithotripsy procedure   Diabetes ([place]) 03/16/2017   Essential hypertension 10/14/2015   GERD (gastroesophageal reflux disease)    Hematuria    Hemorrhoid    History of kidney stones    Hyperlipidemia    Hypertension    Lumbar radiculopathy 03/16/2017   OSA on CPAP    Paronychia 08/16/2015   PE (pulmonary thromboembolism) ([place]) 02/2019   Preoperative cardiovascular examination 08/19/2016   Prostate cancer ([place]) 03/16/2017   Right flank pain    Right ureteral stone    Skin cancer    Type 2 diabetes, diet controlled ([place])    off Metformin > 1 yr now as of PV 02-23-18   Ureteral calculus, right 03/15/2012   Wears glasses     Patient  Active Problem List   Diagnosis Date Noted   Wears glasses    Type 2 diabetes, diet controlled ([place])    Sleep apnea    Skin cancer    Right ureteral stone    Right flank pain    OSA on CPAP    Hypertension    History of kidney stones    Hemorrhoid    Hematuria    Cataract    Allergy    PE (pulmonary thromboembolism) ([place]) 02/03/2019   CKD (chronic kidney disease), 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   GERD (gastroesophageal reflux disease)    OSA (obstructive sleep apnea)    Chest pain    Chest pain in adult 04/13/2018   Acid reflux 03/16/2017   Arthritis 03/16/2017   Hyperlipidemia 03/16/2017   Kidney stone 03/16/2017   Lumbar radiculopathy 03/16/2017   Prostate cancer ([place]) 03/16/2017   Diabetes ([place]) 03/16/2017   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   CARPAL TUNNEL RELEASE Left 06/2016   CARPAL TUNNEL RELEASE Right 03/2017   COLONOSCOPY     last with medoff unsure when    CYSTOSCOPY WITH URETEROSCOPY AND STENT PLACEMENT Right 10/28/2013   Procedure: RIGHT URETEROSCOPY AND STENT PLACEMENT;  Surgeon: [name], MD;  Location: [facility];  Service: Urology;  Laterality: Right;   CYSTOSCOPY/URETEROSCOPY/HOLMIUM LASER/STENT PLACEMENT Left 09/06/2020   Procedure: CYSTOSCOPY/POSSIBLE URETEROSCOPY/HOLMIUM LASER/STENT PLACEMENT;  Surgeon: Remi Haggard, MD;  Location: [facility];  Service: Urology;  Laterality: Left;  30  MINS   EXTRACORPOREAL SHOCK WAVE LITHOTRIPSY  02-08-2009   HOLMIUM LASER APPLICATION Right [id number]   Procedure: HOLMIUM LASER LITHO;  Surgeon: [name], MD;  Location: [facility];  Service: Urology;  Laterality: Right;   LEFT KNEE [facility]   LEFT URETEROSCOPIC STONE EXTRACTION  02-23-2009   POLYPECTOMY  2014   1 polyp per dr Steve Rattler notes- no surg path to determine polyps type but was a 5 yr recall    RADIOACTIVE SEED IMPLANT  08/08/2011   Procedure: RADIOACTIVE SEED IMPLANT;  Surgeon: [name], MD;  Location: [facility];  Service: Urology;  Laterality: N/A;  seeds implanted 50 seeds found in bladder=none    REPLACEMENT TOTAL KNEE Left 2018   SKIN CANCER EXCISION     Burned off left side of nose, and head   TOTAL KNEE ARTHROPLASTY Right 08/2018   UPPER GASTROINTESTINAL ENDOSCOPY     URETEROSCOPY  03/15/2012   Procedure: URETEROSCOPY;  Surgeon: [name], MD;  Location: [facility];  Service: Urology;  Laterality: Right;  Right Ueteroscopy        Family History  Problem Relation Age of Onset   Cancer Mother    Diabetes Father    CAD Father    Heart attack Father    Diabetes Sister    Colon polyps Sister    Colon polyps Brother    Colon cancer Neg Hx    Esophageal cancer Neg Hx    Rectal cancer Neg Hx    Stomach cancer Neg Hx     Social History   Tobacco Use   Smoking status: Former    Years: 10.00    Types: Cigarettes    Quit date: 07/02/1993    Years since quitting: 27.5   Smokeless tobacco: Former    Types: Nurse, children's Use: Never used  Substance Use Topics   Alcohol use: No   Drug use: No    Home Medications Prior to Admission medications   Medication Sig Start Date End Date Taking? Authorizing Provider  atorvastatin (LIPITOR) 10 MG tablet Take 10 mg by mouth at bedtime.    [provider]  cholecalciferol (VITAMIN D3) 25 MCG (1000 UT) tablet Take 1,000  Units by mouth daily.    [provider]  famotidine (PEPCID) 40 MG tablet Take 40 mg by mouth at bedtime.     [provider]  fexofenadine (ALLEGRA) 180 MG tablet Take 180 mg by mouth daily.    [provider]  fluticasone (FLONASE) 50 MCG/ACT  nasal spray Place 2 sprays into the nose daily as needed for allergies.     [provider]  lisinopril (PRINIVIL,ZESTRIL) 10 MG tablet Take 10 mg by mouth daily.     [provider]  Melatonin 5 MG CAPS Take 5 mg by mouth at bedtime.    [provider]  metFORMIN (GLUCOPHAGE) 500 MG tablet Take 500 mg by mouth at bedtime. 05/07/20   [provider]  omega-3 acid ethyl esters (LOVAZA) 1 g capsule Take 4 capsules by mouth at bedtime. 05/02/20   [provider]  omeprazole (PRILOSEC) 20 MG capsule Take 20 mg by mouth daily.    [provider]  rivaroxaban (XARELTO) 20 MG TABS tablet Take 20 mg by mouth daily.    [provider]  tamsulosin (FLOMAX) 0.4 MG CAPS capsule Take 1 capsule (0.4 mg total) by mouth daily. 09/06/20   Remi Haggard, MD  traZODone (DESYREL) 100 MG tablet Take 100 mg at bedtime by mouth.    [provider]    Allergies    Aspirin and Amoxicillin  Review of Systems   Review of Systems  Constitutional:  Negative for chills, diaphoresis, fatigue and fever.  HENT:  Negative for congestion.   Eyes:  Negative for visual disturbance.  Respiratory:  Negative for cough, chest tightness, shortness of breath and wheezing.   Cardiovascular:  Positive for chest pain. Negative for palpitations.  Gastrointestinal:  Negative for abdominal pain, constipation, diarrhea, nausea and vomiting.  Genitourinary:  Negative for dysuria, flank pain and frequency.  Musculoskeletal:  Positive for neck pain. Negative for back pain and neck stiffness.  Skin:  Negative for wound.  Neurological:  Positive for headaches. Negative for dizziness, loss of  consciousness, weakness and light-headedness.  Psychiatric/Behavioral:  Negative for agitation and confusion.   All other systems reviewed and are negative.  Physical Exam Updated Vital Signs BP 115/63   Pulse 65   Temp 98.7 F (37.1 C) (Oral)   Resp 13   Ht '5\' 6"'$  (1.676 m)   Wt 117 kg   SpO2 95%   BMI 41.63 kg/m   Physical Exam Vitals and nursing note reviewed.  Constitutional:      General: He is not in acute distress.    Appearance: He is well-developed. He is not ill-appearing, toxic-appearing or diaphoretic.  HENT:     Head: Normocephalic and atraumatic.     Nose: No congestion or rhinorrhea.     Mouth/Throat:     Mouth: Mucous membranes are moist.     Pharynx: No oropharyngeal exudate or posterior oropharyngeal erythema.  Eyes:     Extraocular Movements: Extraocular movements intact.     Conjunctiva/sclera: Conjunctivae normal.     Pupils: Pupils are equal, round, and reactive to light.  Cardiovascular:     Rate and Rhythm: Normal rate and regular rhythm.     Heart sounds: No murmur heard. Pulmonary:     Effort: Pulmonary effort is normal. No respiratory distress.     Breath sounds: Normal breath sounds. No wheezing, rhonchi or rales.  Chest:     Chest wall: Tenderness present.  Abdominal:     General: Abdomen is flat.     Palpations: Abdomen is soft.     Tenderness: There is no abdominal tenderness. There is no right CVA tenderness, left CVA tenderness, guarding or rebound.  Musculoskeletal:        General: Tenderness present.     Cervical back: Neck supple. Tenderness present.  Skin:    General: Skin is warm and dry.     Capillary Refill: Capillary refill takes less than 2 seconds.     Findings: No erythema.  Neurological:     General: No focal deficit present.     Mental Status: He is alert.     Sensory: No sensory deficit.     Motor: No weakness.  Psychiatric:        Mood and Affect: Mood normal.    ED Results / Procedures / Treatments    Labs (all labs ordered are listed, but only abnormal results are displayed) Labs Reviewed  COMPREHENSIVE METABOLIC PANEL - Abnormal; Notable for the following components:      Result Value   Glucose, Bld 137 (*)    Creatinine, Ser 1.75 (*)    GFR, Estimated 42 (*)    All other components within normal limits  PROTIME-INR - Abnormal; Notable for the following components:   Prothrombin Time 17.9 (*)    INR 1.5 (*)    All other components within normal limits  I-STAT CHEM 8, ED - Abnormal; Notable for the following components:   BUN 25 (*)    Creatinine, Ser 1.80 (*)    Glucose, Bld 128 (*)    Calcium, Ion 1.12 (*)    All other components within normal limits  RESP PANEL BY RT-PCR (FLU A&B, COVID) ARPGX2  CBC  ETHANOL  LACTIC ACID, PLASMA  URINALYSIS, ROUTINE W REFLEX MICROSCOPIC  SAMPLE TO BLOOD BANK    EKG None  Radiology DG Chest Port 1 View  Result Date: 01/26/2021 CLINICAL DATA:  Status post trauma with left-sided chest pain. EXAM: PORTABLE CHEST 1 VIEW COMPARISON:  February 02, 2019 FINDINGS: The cardiac silhouette is mildly enlarged and unchanged in size. Both lungs are clear. The visualized skeletal structures are unremarkable. IMPRESSION: Stable exam without active cardiopulmonary disease. Electronically Signed   By: Virgina Norfolk M.D.   On: 01/26/2021 23:07    Procedures Procedures   Medications Ordered in ED Medications  iohexol (OMNIPAQUE) 350 MG/ML injection 75 mL (75 mLs Intravenous Contrast Given 01/26/21 2328)    ED Course  I have reviewed the triage vital signs and the nursing notes.  Pertinent labs & imaging results that were available during my care of the patient were reviewed by me and considered in my medical decision making (see chart for details).    MDM Rules/Calculators/A&P                           Joseph Pham is a 70 y.o. male with a past medical history significant for previous pulmonary embolism on Xarelto therapy,  hypertension, diabetes, GERD, hyperlipidemia, CKD, sleep apnea, and previous prostate cancer who presents for trauma.  According to patient, he was brought to the emergency department for evaluation after he had a fall this evening while trying to get some jars for barbecue sauce.  He reports he was on a stepladder when he fell at least 3 feet up in the air and hit his left elbow, left shoulder, neck, head, left chest, and right knee on the cabinet in the floor.  He did not lose consciousness but is having significant pain.  As he is on Xarelto, he was told to come in.  Patient was brought in by family and then upon arrival to the emergency department given the head injury and trauma, was made a level 2 trauma.  Patient was quickly brought  back to the resuscitation room where I evaluated the patient.  Airway is intact and breath sounds are equal bilaterally.  Patient is tender on his left chest under his breast.  His back was nontender with careful rolling but he does have bruising and swelling to the right knee and tenderness in the left elbow and left shoulder.  Tenderness also in the cervical spine and the left occipital area.  No focal neurologic deficits initially.  Pupils symmetric and reactive.  Clear speech.  Patient is very pleasant and denies any abdominal tenderness or pelvis tenderness.  Patient had portable chest x-ray which I did not see evidence of pneumothorax.  Due to the tenderness, will get CT head, CT neck, CT chest and x-rays of the left shoulder, left elbow, and right knee.  He will also get trauma labs.  Anticipate reassessment after work-up is completed to determine disposition.   Final Clinical Impression(s) / ED Diagnoses Final diagnoses:  Trauma  Fall from ladder, initial encounter  Injury of head, initial encounter    Rx / DC Orders ED Discharge Orders          Ordered    lidocaine (LIDODERM) 5 %  Every 24 hours        01/27/21 0042    cyclobenzaprine (FLEXERIL) 5  MG tablet  2 times daily PRN        01/27/21 0042            Clinical Impression: 1. Fall from ladder, initial encounter   2. Trauma   3. Injury of head, initial encounter     Disposition: Discharge  Condition: Good  I have discussed the results, Dx and Tx plan with the pt(& family if present). He/she/they expressed understanding and agree(s) with the plan. Discharge instructions discussed at great length. Strict return precautions discussed and pt &/or family have verbalized understanding of the instructions. No further questions at time of discharge.    New Prescriptions   CYCLOBENZAPRINE (FLEXERIL) 5 MG TABLET    Take 1 tablet (5 mg total) by mouth 2 (two) times daily as needed for up to 10 days for muscle spasms.   LIDOCAINE (LIDODERM) 5 %    Place 1 patch onto the skin daily. Remove & Discard patch within 12 hours or as directed by MD    Follow Up: Angelina Sheriff, MD [place] [id number] [place] [address] [id number] mc [place]       Kayleen Alig, Gwenyth Allegra, MD 01/27/21 [phone]

## 2021-01-26 NOTE — ED Notes (Signed)
Back to room at this time.

## 2021-01-26 NOTE — ED Notes (Signed)
Log rolled at this time. Reports of cervical tenderness.

## 2021-01-26 NOTE — ED Provider Notes (Signed)
Emergency Medicine Provider Triage Evaluation Note  Joseph Pham , a 70 y.o. male  was evaluated in triage.  Pt complains of fall on thinners. Xarelto, hx of PE. Standing on stool. Went out from under him. Hit back of head and neck. Has right knee pain, swelling  Review of Systems  Positive: Fall, neck/ head trauma, right knee pain Negative: Weakness, numbness  Physical Exam  There were no vitals taken for this visit. Gen:   Awake, no distress   Neck:  C collar present Resp:  Normal effort  MSK:   Moves extremities without difficulty, hematoma to right medial knee Other:    Medical Decision Making  Medically screening exam initiated at 10:33 PM.  Appropriate orders placed.  Joseph Pham was informed that the remainder of the evaluation will be completed by another provider, this initial triage assessment does not replace that evaluation, and the importance of remaining in the ED until their evaluation is complete.  Fall on thinner, neck pain, right knee pain  Level 2 trauma called due to fall on thinners, patient moved to back room   Demarr Kluever A, PA-C 01/26/21 2235    Tegeler, Gwenyth Allegra, MD 01/27/21 [phone]

## 2021-01-26 NOTE — ED Notes (Signed)
Patient transported to CT 

## 2021-01-26 NOTE — ED Notes (Signed)
Family updated as to patient's status. Currently at bedside.

## 2021-01-26 NOTE — ED Triage Notes (Signed)
Pt had mechanical fall - hit back of head , right leg and left arm. Pt on xarelto.

## 2021-01-27 LAB — RESP PANEL BY RT-PCR (FLU A&B, COVID) ARPGX2
Influenza A by PCR: NEGATIVE
Influenza B by PCR: NEGATIVE
SARS Coronavirus 2 by RT PCR: NEGATIVE

## 2021-01-27 MED ORDER — LIDOCAINE 5 % EX PTCH: 1.0000 | MEDICATED_PATCH | CUTANEOUS | 0 refills | Status: DC

## 2021-01-27 MED ORDER — CYCLOBENZAPRINE HCL 5 MG PO TABS: 5.0000 mg | ORAL_TABLET | Freq: Two times a day (BID) | ORAL | 0 refills | Status: AC | PRN

## 2021-01-27 NOTE — Discharge Instructions (Addendum)
Your history, exam, work-up today are consistent with likely musculoskeletal and soft tissue injury after the fall you sustained this evening.  Fortunately, your work-up did not show any acute traumatic injuries requiring intervention or admission at this time.  Your labs were similar to prior or improved.  Please use the muscle relaxant and the patches to help with the discomfort and please continue to take deep breaths.  Please follow-up with your primary doctor for reassessment.  If any symptoms change or worsen, please return to the nearest emergency department.

## 2021-02-01 DIAGNOSIS — L03119 Cellulitis of unspecified part of limb: Secondary | ICD-10-CM | POA: Diagnosis not present

## 2021-02-01 DIAGNOSIS — Z6841 Body Mass Index (BMI) 40.0 and over, adult: Secondary | ICD-10-CM | POA: Diagnosis not present

## 2021-02-01 DIAGNOSIS — T148XXA Other injury of unspecified body region, initial encounter: Secondary | ICD-10-CM | POA: Diagnosis not present

## 2021-02-05 DIAGNOSIS — L570 Actinic keratosis: Secondary | ICD-10-CM | POA: Diagnosis not present

## 2021-02-05 DIAGNOSIS — L738 Other specified follicular disorders: Secondary | ICD-10-CM | POA: Diagnosis not present

## 2021-02-05 DIAGNOSIS — M7981 Nontraumatic hematoma of soft tissue: Secondary | ICD-10-CM | POA: Diagnosis not present

## 2021-02-05 DIAGNOSIS — L821 Other seborrheic keratosis: Secondary | ICD-10-CM | POA: Diagnosis not present

## 2021-02-05 DIAGNOSIS — Z85828 Personal history of other malignant neoplasm of skin: Secondary | ICD-10-CM | POA: Diagnosis not present

## 2021-02-11 DIAGNOSIS — M76829 Posterior tibial tendinitis, unspecified leg: Secondary | ICD-10-CM | POA: Insufficient documentation

## 2021-02-11 DIAGNOSIS — M76822 Posterior tibial tendinitis, left leg: Secondary | ICD-10-CM | POA: Diagnosis not present

## 2021-02-11 DIAGNOSIS — M25571 Pain in right ankle and joints of right foot: Secondary | ICD-10-CM | POA: Diagnosis not present

## 2021-02-11 DIAGNOSIS — M25572 Pain in left ankle and joints of left foot: Secondary | ICD-10-CM | POA: Diagnosis not present

## 2021-02-11 DIAGNOSIS — M65861 Other synovitis and tenosynovitis, right lower leg: Secondary | ICD-10-CM | POA: Diagnosis not present

## 2021-02-11 HISTORY — DX: Posterior tibial tendinitis, unspecified leg: M76.829

## 2021-02-24 DIAGNOSIS — M25571 Pain in right ankle and joints of right foot: Secondary | ICD-10-CM | POA: Diagnosis not present

## 2021-02-24 DIAGNOSIS — M25572 Pain in left ankle and joints of left foot: Secondary | ICD-10-CM | POA: Diagnosis not present

## 2021-03-06 DIAGNOSIS — M25872 Other specified joint disorders, left ankle and foot: Secondary | ICD-10-CM | POA: Insufficient documentation

## 2021-03-06 HISTORY — DX: Other specified joint disorders, left ankle and foot: M25.872

## 2021-04-04 DIAGNOSIS — H52223 Regular astigmatism, bilateral: Secondary | ICD-10-CM | POA: Diagnosis not present

## 2021-04-04 DIAGNOSIS — Z7984 Long term (current) use of oral hypoglycemic drugs: Secondary | ICD-10-CM | POA: Diagnosis not present

## 2021-04-04 DIAGNOSIS — H25011 Cortical age-related cataract, right eye: Secondary | ICD-10-CM | POA: Diagnosis not present

## 2021-04-04 DIAGNOSIS — H5203 Hypermetropia, bilateral: Secondary | ICD-10-CM | POA: Diagnosis not present

## 2021-04-04 DIAGNOSIS — E119 Type 2 diabetes mellitus without complications: Secondary | ICD-10-CM | POA: Diagnosis not present

## 2021-04-04 DIAGNOSIS — H25012 Cortical age-related cataract, left eye: Secondary | ICD-10-CM | POA: Diagnosis not present

## 2021-04-04 DIAGNOSIS — H25092 Other age-related incipient cataract, left eye: Secondary | ICD-10-CM | POA: Diagnosis not present

## 2021-04-04 DIAGNOSIS — I10 Essential (primary) hypertension: Secondary | ICD-10-CM | POA: Diagnosis not present

## 2021-04-04 DIAGNOSIS — H524 Presbyopia: Secondary | ICD-10-CM | POA: Diagnosis not present

## 2021-04-08 DIAGNOSIS — Z20822 Contact with and (suspected) exposure to covid-19: Secondary | ICD-10-CM | POA: Diagnosis not present

## 2021-04-15 ENCOUNTER — Other Ambulatory Visit: Payer: Self-pay

## 2021-04-15 ENCOUNTER — Ambulatory Visit (INDEPENDENT_AMBULATORY_CARE_PROVIDER_SITE_OTHER): Payer: Medicare Other | Admitting: Cardiology

## 2021-04-15 ENCOUNTER — Encounter: Payer: Self-pay | Admitting: Cardiology

## 2021-04-15 VITALS — BP 129/83 | HR 58 | Ht 66.0 in | Wt 267.0 lb

## 2021-04-15 DIAGNOSIS — I10 Essential (primary) hypertension: Secondary | ICD-10-CM

## 2021-04-15 DIAGNOSIS — I7789 Other specified disorders of arteries and arterioles: Secondary | ICD-10-CM

## 2021-04-15 DIAGNOSIS — Z7901 Long term (current) use of anticoagulants: Secondary | ICD-10-CM

## 2021-04-15 DIAGNOSIS — I351 Nonrheumatic aortic (valve) insufficiency: Secondary | ICD-10-CM

## 2021-04-15 MED ORDER — RIVAROXABAN 15 MG PO TABS: 15.0000 mg | ORAL_TABLET | Freq: Every day | ORAL | 3 refills | Status: DC

## 2021-04-15 NOTE — Progress Notes (Signed)
Cardiology Office Note:    Date:  04/15/2021   ID:  Joseph Pham, DOB 08-06-1950, MRN [id number]  PCP:  Angelina Sheriff, MD  Cardiologist:  Shirlee More, MD    Referring MD: Angelina Sheriff, MD    ASSESSMENT:    1. Chronic anticoagulation   2. Hypertension, unspecified type   3. Essential hypertension   4. Ascending aorta enlargement (HCC)   5. Nonrheumatic aortic valve insufficiency    PLAN:    In order of problems listed above:  He continues to do well continue his anticoagulant for reducing to 15 mg with his CKD Stable well-controlled continue treatment including ACE inhibitors Stable CT including his thoracic aorta on angiography in the emergency room I do not think we need to repeat one this year Stable mild     Next appointment: 1 year   Medication Adjustments/Labs and Tests Ordered: Current medicines are reviewed at length with the patient today.  Concerns regarding medicines are outlined above.  Orders Placed This Encounter  Procedures   EKG 12-Lead    Meds ordered this encounter  Medications   Rivaroxaban (XARELTO) 15 MG TABS tablet    Sig: Take 1 tablet (15 mg total) by mouth daily.    Dispense:  90 tablet    Refill:  3     Chief Complaint  Patient presents with   Follow-up   Anticoagulation    History of Present Illness:    Joseph Pham is a 70 y.o. male with a hx of enlarging ascending aorta 42 mm on CT scan 10/05/2019 mild aortic insufficiency hypertension with renal artery stenosis and unprovoked pulmonary embolism with long-term anticoagulation.  He was last seen 04/19/2020.He was discharged from [facility] 02/04/2019 after presenting to the emergency room with increasing shortness of breath and was found to have bilateral pulmonary embolism.  He was admitted to the hospital initially treated with heparin and transitioned to Xarelto.  The venous thromboembolism was felt to be unprovoked and recommended initially  for 6 months of anticoagulation and consideration of long-term treatment.  In hospital he had an echocardiogram performed showing normal right ventricular size and function left ventricular ejection fraction was 60 to 65%.  He also had mild aortic regurgitation.  CTA showed unchanged in the aneurysmal dilation of ascending aorta 42 mm and the findings of filling defect in the segmental pulmonary arteries of the right upper lobe right lower lobe without evidence of right heart strain.  He also has mild ostial narrowing of the left renal artery.  Noncontrast CTA 2019 November maximum diameter 43 mm.   Compliance with diet, lifestyle and medications: Yes  Had CT of the chest performed 01/26/2021 there is no mention of his thoracic aorta being enlarged and says he has mild atherosclerotic calcification.  Since his fall he has done well no clinical bleeding shortness of breath edema chest pain palpitation or syncope. 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   Aftercare following surgery 08/30/2015   Allergy    Arthritis HANDS AND LEFT KNEE   Ascending aorta enlargement ([place]) 08/19/2016   Overview:  43 mm ascending, 11/22/15   Cataract    forming bilaterally    Complication of anesthesia    low heart rate during lithotripsy procedure   Diabetes ([place]) 03/16/2017   Essential  hypertension 10/14/2015   GERD (gastroesophageal reflux disease)    Hematuria    Hemorrhoid    History of kidney stones    Hyperlipidemia    Hypertension    Lumbar radiculopathy 03/16/2017   OSA on CPAP    Paronychia 08/16/2015   PE (pulmonary thromboembolism) ([place]) 02/2019   Preoperative cardiovascular examination 08/19/2016   Prostate cancer ([place]) 03/16/2017   Right flank pain    Right ureteral stone    Skin cancer    Type 2 diabetes, diet controlled ([place])    off  Metformin > 1 yr now as of PV 02-23-18   Ureteral calculus, right 03/15/2012   Wears glasses     Past Surgical History:  Procedure Laterality Date   BACK SURGERY  2019   rod and screws    BILATERAL INGUINAL HERNIA REPAIR  1989   CARDIAC CATHETERIZATION  01-11-2004   NO EVIDENCE SIGNIFICANT EPICARDIAL FLOW LIMITING 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   CARPAL TUNNEL RELEASE Left 06/2016   CARPAL TUNNEL RELEASE Right 03/2017   COLONOSCOPY     last with medoff unsure when    CYSTOSCOPY WITH URETEROSCOPY AND STENT PLACEMENT Right 10/28/2013   Procedure: RIGHT URETEROSCOPY AND STENT PLACEMENT;  Surgeon: [name], MD;  Location[facility];  Service: Urology;  Laterality: Right;   CYSTOSCOPY/URETEROSCOPY/HOLMIUM LASER/STENT PLACEMENT Left 09/06/2020   Procedure: CYSTOSCOPY/POSSIBLE URETEROSCOPY/HOLMIUM LASER/STENT PLACEMENT;  Surgeon: Remi Haggard, MD;  Location: [facility];  Service: Urology;  Laterality: Left;  30 MINS   EXTRACORPOREAL SHOCK WAVE LITHOTRIPSY  02-08-2009   HOLMIUM LASER APPLICATION Right 11/22/6331   Procedure: HOLMIUM LASER LITHO;  Surgeon: [name], MD;  Location: [facility];  Service: Urology;  Laterality: Right;   LEFT KNEE [place]   LEFT URETEROSCOPIC STONE EXTRACTION  02-23-2009   POLYPECTOMY  2014   1 polyp per dr Steve Rattler notes- no surg path to determine polyps type but was a 5 yr recall    RADIOACTIVE SEED IMPLANT  08/08/2011   Procedure: RADIOACTIVE SEED IMPLANT;  Surgeon: [name], MD;  Location: [facility];  Service: Urology;  Laterality: N/A;  seeds implanted 50 seeds found in bladder=none    REPLACEMENT TOTAL KNEE Left 2018   SKIN CANCER EXCISION     Burned off left side of nose, and head   TOTAL KNEE ARTHROPLASTY Right 08/2018   UPPER GASTROINTESTINAL ENDOSCOPY     URETEROSCOPY  03/15/2012   Procedure: URETEROSCOPY;  Surgeon: [name], MD;   Location[facility];  Service: Urology;  Laterality: Right;  Right Ueteroscopy     Current Medications: Current Meds  Medication Sig   atorvastatin (LIPITOR) 10 MG tablet Take 10 mg by mouth at bedtime.   cholecalciferol (VITAMIN D3) 25 MCG (1000 UT) tablet Take 1,000 Units by mouth daily.   famotidine (PEPCID) 40 MG tablet Take 40 mg by mouth at bedtime.    fexofenadine (ALLEGRA) 180 MG tablet Take 180 mg by mouth daily.   fluticasone (FLONASE) 50 MCG/ACT nasal spray Place 2 sprays into the nose daily as needed for allergies.    lisinopril (PRINIVIL,ZESTRIL) 10 MG tablet Take 10 mg by mouth daily.    Melatonin 5 MG CAPS Take 5 mg by mouth at bedtime.   metFORMIN (GLUCOPHAGE) 500 MG tablet Take 500 mg by mouth at bedtime.   omega-3 acid ethyl esters (LOVAZA) 1 g capsule  Take 4 capsules by mouth at bedtime.   omeprazole (PRILOSEC) 20 MG capsule Take 20 mg by mouth daily.   Rivaroxaban (XARELTO) 15 MG TABS tablet Take 1 tablet (15 mg total) by mouth daily.   tamsulosin (FLOMAX) 0.4 MG CAPS capsule Take 1 capsule (0.4 mg total) by mouth daily.   traZODone (DESYREL) 100 MG tablet Take 100 mg at bedtime by mouth.   [DISCONTINUED] rivaroxaban (XARELTO) 20 MG TABS tablet Take 20 mg by mouth daily.     Allergies:   Aspirin and Amoxicillin   Social History   Socioeconomic History   Marital status: Married    Spouse name: Not on file   Number of children: Not on file   Years of education: Not on file   Highest education level: Not on file  Occupational History   Not on file  Tobacco Use   Smoking status: Former    Years: 10.00    Types: Cigarettes    Quit date: 07/02/1993    Years since quitting: 27.8   Smokeless tobacco: Former    Types: Nurse, children's Use: Never used  Substance and Sexual Activity   Alcohol use: No   Drug use: No   Sexual activity: Not on file  Other Topics Concern   Not on file  Social History Narrative   Not on file   Social  Determinants of Health   Financial Resource Strain: Not on file  Food Insecurity: Not on file  Transportation Needs: Not on file  Physical Activity: Not on file  Stress: Not on file  Social Connections: Not on file     Family History: The patient's family history includes CAD in his father; Cancer in his mother; Colon polyps in his brother and sister; Diabetes in his father and sister; Heart attack in his father. There is no history of Colon cancer, Esophageal cancer, Rectal cancer, or Stomach cancer. ROS:   Please see the history of present illness.    All other systems reviewed and are negative.  EKGs/Labs/Other Studies Reviewed:    The following studies were reviewed today:  EKG:  EKG ordered today and personally reviewed.  The ekg ordered today demonstrates sinus rhythm normal EKG heart rate 58 bpm  Recent Labs: 01/26/2021: ALT 27; BUN 25; Creatinine, Ser 1.80; Hemoglobin 13.9; Platelets 159; Potassium 3.9; Sodium 139  Recent Lipid Panel    Component Value Date/Time   CHOL 153 02/03/2019 0130   TRIG 235 (H) 02/03/2019 0130   HDL 35 (L) 02/03/2019 0130   CHOLHDL 4.4 02/03/2019 0130   VLDL 47 (H) 02/03/2019 0130   LDLCALC 71 02/03/2019 0130    Physical Exam:    VS:  BP 129/83   Pulse (!) 58   Ht 5\' 6"  (1.676 m)   Wt 267 lb (121.1 kg)   SpO2 97%   BMI 43.09 kg/m     Wt Readings from Last 3 Encounters:  04/15/21 267 lb (121.1 kg)  01/26/21 257 lb 15 oz (117 kg)  09/04/20 258 lb (117 kg)     GEN: Obese BMI 43 well nourished, well developed in no acute distress HEENT: Normal NECK: No JVD; No carotid bruits LYMPHATICS: No lymphadenopathy CARDIAC: RRR, no murmurs, rubs, gallops RESPIRATORY:  Clear to auscultation without rales, wheezing or rhonchi  ABDOMEN: Soft, non-tender, non-distended MUSCULOSKELETAL:  No edema; No deformity  SKIN: Warm and dry NEUROLOGIC:  Alert and oriented x 3 PSYCHIATRIC:  Normal affect    Signed, Aaron Edelman  Bettina Gavia, MD  04/15/2021 10:31  AM    [place]

## 2021-04-15 NOTE — Patient Instructions (Addendum)
Medication Instructions:  Your physician has recommended you make the following change in your medication:  CHANGE: Xarelto 15 mg take one tablet by mouth daily.   *If you need a refill on your cardiac medications before your next appointment, please call your pharmacy*   Lab Work: None If you have labs (blood work) drawn today and your tests are completely normal, you will receive your results only by: [place] (if you have MyChart) OR A paper copy in the mail If you have any lab test that is abnormal or we need to change your treatment, we will call you to review the results.   Testing/Procedures: None   Follow-Up: At [facility], you and your health needs are our priority.  As part of our continuing mission to provide you with exceptional heart care, we have created designated Provider Care Teams.  These Care Teams include your primary Cardiologist (physician) and Advanced Practice Providers (APPs -  Physician Assistants and Nurse Practitioners) who all work together to provide you with the care you need, when you need it.  We recommend signing up for the patient portal called "MyChart".  Sign up information is provided on this After Visit Summary.  MyChart is used to connect with patients for Virtual Visits (Telemedicine).  Patients are able to view lab/test results, encounter notes, upcoming appointments, etc.  Non-urgent messages can be sent to your provider as well.   To learn more about what you can do with MyChart, go to [email or web address].    Your next appointment:   1 year(s)  The format for your next appointment:   In Person  Provider:   Shirlee More, MD    Other Instructions

## 2021-05-09 DIAGNOSIS — H25811 Combined forms of age-related cataract, right eye: Secondary | ICD-10-CM | POA: Diagnosis not present

## 2021-05-09 DIAGNOSIS — Z01818 Encounter for other preprocedural examination: Secondary | ICD-10-CM | POA: Diagnosis not present

## 2021-05-09 DIAGNOSIS — E119 Type 2 diabetes mellitus without complications: Secondary | ICD-10-CM | POA: Diagnosis not present

## 2021-05-10 DIAGNOSIS — Z8546 Personal history of malignant neoplasm of prostate: Secondary | ICD-10-CM | POA: Diagnosis not present

## 2021-05-15 DIAGNOSIS — R310 Gross hematuria: Secondary | ICD-10-CM | POA: Diagnosis not present

## 2021-05-15 DIAGNOSIS — N3000 Acute cystitis without hematuria: Secondary | ICD-10-CM | POA: Diagnosis not present

## 2021-05-15 DIAGNOSIS — N200 Calculus of kidney: Secondary | ICD-10-CM | POA: Diagnosis not present

## 2021-06-11 DIAGNOSIS — R7302 Impaired glucose tolerance (oral): Secondary | ICD-10-CM | POA: Diagnosis not present

## 2021-06-11 DIAGNOSIS — M79673 Pain in unspecified foot: Secondary | ICD-10-CM | POA: Diagnosis not present

## 2021-06-11 DIAGNOSIS — Z6841 Body Mass Index (BMI) 40.0 and over, adult: Secondary | ICD-10-CM | POA: Diagnosis not present

## 2021-06-12 DIAGNOSIS — H2511 Age-related nuclear cataract, right eye: Secondary | ICD-10-CM | POA: Diagnosis not present

## 2021-06-12 DIAGNOSIS — H52221 Regular astigmatism, right eye: Secondary | ICD-10-CM | POA: Diagnosis not present

## 2021-06-12 DIAGNOSIS — H5201 Hypermetropia, right eye: Secondary | ICD-10-CM | POA: Diagnosis not present

## 2021-06-12 DIAGNOSIS — H25811 Combined forms of age-related cataract, right eye: Secondary | ICD-10-CM | POA: Diagnosis not present

## 2021-06-12 DIAGNOSIS — Z9841 Cataract extraction status, right eye: Secondary | ICD-10-CM | POA: Diagnosis not present

## 2021-06-12 DIAGNOSIS — Z961 Presence of intraocular lens: Secondary | ICD-10-CM | POA: Diagnosis not present

## 2021-07-03 DIAGNOSIS — M65861 Other synovitis and tenosynovitis, right lower leg: Secondary | ICD-10-CM | POA: Diagnosis not present

## 2021-07-10 DIAGNOSIS — H2512 Age-related nuclear cataract, left eye: Secondary | ICD-10-CM | POA: Diagnosis not present

## 2021-07-10 DIAGNOSIS — H25812 Combined forms of age-related cataract, left eye: Secondary | ICD-10-CM | POA: Diagnosis not present

## 2021-07-19 DIAGNOSIS — R509 Fever, unspecified: Secondary | ICD-10-CM | POA: Diagnosis not present

## 2021-07-19 DIAGNOSIS — J324 Chronic pansinusitis: Secondary | ICD-10-CM | POA: Diagnosis not present

## 2021-07-19 DIAGNOSIS — Z20828 Contact with and (suspected) exposure to other viral communicable diseases: Secondary | ICD-10-CM | POA: Diagnosis not present

## 2021-07-23 DIAGNOSIS — J40 Bronchitis, not specified as acute or chronic: Secondary | ICD-10-CM | POA: Diagnosis not present

## 2021-07-23 DIAGNOSIS — J329 Chronic sinusitis, unspecified: Secondary | ICD-10-CM | POA: Diagnosis not present

## 2021-07-23 DIAGNOSIS — J302 Other seasonal allergic rhinitis: Secondary | ICD-10-CM | POA: Diagnosis not present

## 2021-08-04 DIAGNOSIS — Z20828 Contact with and (suspected) exposure to other viral communicable diseases: Secondary | ICD-10-CM | POA: Diagnosis not present

## 2021-08-28 DIAGNOSIS — Z20822 Contact with and (suspected) exposure to covid-19: Secondary | ICD-10-CM | POA: Diagnosis not present

## 2021-09-04 DIAGNOSIS — Z20822 Contact with and (suspected) exposure to covid-19: Secondary | ICD-10-CM | POA: Diagnosis not present

## 2021-09-24 DIAGNOSIS — R5383 Other fatigue: Secondary | ICD-10-CM | POA: Diagnosis not present

## 2021-09-24 DIAGNOSIS — E7800 Pure hypercholesterolemia, unspecified: Secondary | ICD-10-CM | POA: Diagnosis not present

## 2021-09-24 DIAGNOSIS — Z6841 Body Mass Index (BMI) 40.0 and over, adult: Secondary | ICD-10-CM | POA: Diagnosis not present

## 2021-09-24 DIAGNOSIS — I10 Essential (primary) hypertension: Secondary | ICD-10-CM | POA: Diagnosis not present

## 2021-09-24 DIAGNOSIS — R7302 Impaired glucose tolerance (oral): Secondary | ICD-10-CM | POA: Diagnosis not present

## 2021-09-26 DIAGNOSIS — D519 Vitamin B12 deficiency anemia, unspecified: Secondary | ICD-10-CM | POA: Diagnosis not present

## 2021-10-03 DIAGNOSIS — D519 Vitamin B12 deficiency anemia, unspecified: Secondary | ICD-10-CM | POA: Diagnosis not present

## 2021-10-09 DIAGNOSIS — D519 Vitamin B12 deficiency anemia, unspecified: Secondary | ICD-10-CM | POA: Diagnosis not present

## 2021-10-16 DIAGNOSIS — D519 Vitamin B12 deficiency anemia, unspecified: Secondary | ICD-10-CM | POA: Diagnosis not present

## 2021-11-13 DIAGNOSIS — R0981 Nasal congestion: Secondary | ICD-10-CM | POA: Diagnosis not present

## 2021-11-13 DIAGNOSIS — J324 Chronic pansinusitis: Secondary | ICD-10-CM | POA: Diagnosis not present

## 2021-11-22 DIAGNOSIS — R3121 Asymptomatic microscopic hematuria: Secondary | ICD-10-CM | POA: Diagnosis not present

## 2021-11-22 DIAGNOSIS — N200 Calculus of kidney: Secondary | ICD-10-CM | POA: Diagnosis not present

## 2021-12-09 DIAGNOSIS — Z79899 Other long term (current) drug therapy: Secondary | ICD-10-CM | POA: Diagnosis not present

## 2021-12-09 DIAGNOSIS — E7800 Pure hypercholesterolemia, unspecified: Secondary | ICD-10-CM | POA: Diagnosis not present

## 2021-12-09 DIAGNOSIS — R7302 Impaired glucose tolerance (oral): Secondary | ICD-10-CM | POA: Diagnosis not present

## 2021-12-09 DIAGNOSIS — D510 Vitamin B12 deficiency anemia due to intrinsic factor deficiency: Secondary | ICD-10-CM | POA: Diagnosis not present

## 2021-12-09 DIAGNOSIS — I10 Essential (primary) hypertension: Secondary | ICD-10-CM | POA: Diagnosis not present

## 2021-12-09 DIAGNOSIS — Z6841 Body Mass Index (BMI) 40.0 and over, adult: Secondary | ICD-10-CM | POA: Diagnosis not present

## 2022-01-06 DIAGNOSIS — D510 Vitamin B12 deficiency anemia due to intrinsic factor deficiency: Secondary | ICD-10-CM | POA: Diagnosis not present

## 2022-01-31 DIAGNOSIS — R059 Cough, unspecified: Secondary | ICD-10-CM | POA: Diagnosis not present

## 2022-01-31 DIAGNOSIS — J209 Acute bronchitis, unspecified: Secondary | ICD-10-CM | POA: Diagnosis not present

## 2022-01-31 DIAGNOSIS — R519 Headache, unspecified: Secondary | ICD-10-CM | POA: Diagnosis not present

## 2022-02-05 ENCOUNTER — Institutional Professional Consult (permissible substitution): Payer: Medicare Other | Admitting: Pulmonary Disease

## 2022-02-11 DIAGNOSIS — X32XXXS Exposure to sunlight, sequela: Secondary | ICD-10-CM | POA: Diagnosis not present

## 2022-02-11 DIAGNOSIS — L570 Actinic keratosis: Secondary | ICD-10-CM | POA: Diagnosis not present

## 2022-02-11 DIAGNOSIS — L578 Other skin changes due to chronic exposure to nonionizing radiation: Secondary | ICD-10-CM | POA: Diagnosis not present

## 2022-02-11 DIAGNOSIS — L821 Other seborrheic keratosis: Secondary | ICD-10-CM | POA: Diagnosis not present

## 2022-02-11 DIAGNOSIS — L918 Other hypertrophic disorders of the skin: Secondary | ICD-10-CM | POA: Diagnosis not present

## 2022-02-11 DIAGNOSIS — D1801 Hemangioma of skin and subcutaneous tissue: Secondary | ICD-10-CM | POA: Diagnosis not present

## 2022-02-11 DIAGNOSIS — L814 Other melanin hyperpigmentation: Secondary | ICD-10-CM | POA: Diagnosis not present

## 2022-02-11 DIAGNOSIS — D692 Other nonthrombocytopenic purpura: Secondary | ICD-10-CM | POA: Diagnosis not present

## 2022-02-11 DIAGNOSIS — Z85828 Personal history of other malignant neoplasm of skin: Secondary | ICD-10-CM | POA: Diagnosis not present

## 2022-02-27 ENCOUNTER — Ambulatory Visit (INDEPENDENT_AMBULATORY_CARE_PROVIDER_SITE_OTHER): Payer: Medicare Other | Admitting: Pulmonary Disease

## 2022-02-27 ENCOUNTER — Encounter: Payer: Self-pay | Admitting: Pulmonary Disease

## 2022-02-27 VITALS — BP 124/78 | HR 68 | Temp 98.0°F | Ht 66.0 in | Wt 225.0 lb

## 2022-02-27 DIAGNOSIS — Z9989 Dependence on other enabling machines and devices: Secondary | ICD-10-CM | POA: Diagnosis not present

## 2022-02-27 DIAGNOSIS — G4733 Obstructive sleep apnea (adult) (pediatric): Secondary | ICD-10-CM | POA: Diagnosis not present

## 2022-02-27 MED ORDER — ZOLPIDEM TARTRATE ER 12.5 MG PO TBCR: 12.5000 mg | EXTENDED_RELEASE_TABLET | Freq: Every evening | ORAL | 0 refills | Status: DC | PRN

## 2022-02-27 NOTE — Patient Instructions (Signed)
I will see you back in 6 months  Continue using CPAP on a nightly basis  Prescription for Ambien 12.5 sent to pharmacy for you, use this in place of the trazodone to see if it helps better  Call us with significant concerns

## 2022-02-27 NOTE — Progress Notes (Signed)
Joseph Pham    [id number]    01/16/1951  Primary Care Physician:[place], Angelique Blonder, MD  Referring Physician: Angelina Sheriff, MD [place],  [place] [address]  Chief complaint:   Patient is in for evaluation of obstructive sleep apnea  HPI:  Recently got an updated machine Uses a CPAP nightly CPAP of 11  Has been using CPAP for about 15 years Sleeps well with CPAP  He does have sinus problems with associated sinus drainage, sinus stuffiness for which he uses a nasal spray as needed  Wakes up feeling like he is at a good nights rest Usually goes to bed about 10-11 PM About 20 minutes to fall asleep No significant awakenings Final wake up time between 5 and 6 AM  His weight has remained about the same  Quit smoking in 1995  He has hypertension, diabetes, hypercholesterolemia  History of back surgery  Unable to be as active as he wants to be because of chronic back pain  He does have some insomnia for which she has been on trazodone, was recently told to cut trazodone to only 50 mg at night, this is not working as well  Outpatient Encounter Medications as of 02/27/2022  Medication Sig   atorvastatin (LIPITOR) 10 MG tablet Take 10 mg by mouth at bedtime.   cholecalciferol (VITAMIN D3) 25 MCG (1000 UT) tablet Take 1,000 Units by mouth daily.   famotidine (PEPCID) 40 MG tablet Take 40 mg by mouth at bedtime.    fexofenadine (ALLEGRA) 180 MG tablet Take 180 mg by mouth daily.   fluticasone (FLONASE) 50 MCG/ACT nasal spray Place 2 sprays into the nose daily as needed for allergies.    lisinopril (PRINIVIL,ZESTRIL) 10 MG tablet Take 10 mg by mouth daily.    Melatonin 5 MG CAPS Take 5 mg by mouth at bedtime.   metFORMIN (GLUCOPHAGE) 500 MG tablet Take 500 mg by mouth at bedtime.   omega-3 acid ethyl esters (LOVAZA) 1 g capsule Take 4 capsules by mouth at bedtime.   omeprazole (PRILOSEC) 20 MG capsule Take 20 mg by mouth daily.    Rivaroxaban (XARELTO) 15 MG TABS tablet Take 1 tablet (15 mg total) by mouth daily.   tamsulosin (FLOMAX) 0.4 MG CAPS capsule Take 1 capsule (0.4 mg total) by mouth daily.   traZODone (DESYREL) 100 MG tablet Take 100 mg at bedtime by mouth.   zolpidem (AMBIEN CR) 12.5 MG CR tablet Take 1 tablet (12.5 mg total) by mouth at bedtime as needed for sleep.   No facility-administered encounter medications on file as of 02/27/2022.    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   Aftercare following surgery 08/30/2015   Allergy    Arthritis HANDS AND LEFT KNEE   Ascending aorta enlargement ([place]) 08/19/2016   Overview:  43 mm ascending, 11/22/15   Cataract    forming bilaterally    Complication of anesthesia    low heart rate during lithotripsy procedure   Diabetes ([place]) 03/16/2017   Essential hypertension 10/14/2015   GERD (gastroesophageal reflux disease)    Hematuria    Hemorrhoid  History of kidney stones    Hyperlipidemia    Hypertension    Lumbar radiculopathy 03/16/2017   OSA on CPAP    Paronychia 08/16/2015   PE (pulmonary thromboembolism) ([place]) 02/2019   Preoperative cardiovascular examination 08/19/2016   Prostate cancer ([place]) 03/16/2017   Right flank pain    Right ureteral stone    Skin cancer    Type 2 diabetes, diet controlled ([place])    off Metformin > 1 yr now as of PV 02-23-18   Ureteral calculus, right 03/15/2012   Wears glasses     Past Surgical History:  Procedure Laterality Date   BACK SURGERY  2019   rod and screws    BILATERAL INGUINAL HERNIA REPAIR  1989   CARDIAC CATHETERIZATION  01-11-2004   NO EVIDENCE SIGNIFICANT EPICARDIAL FLOW LIMITING CAD/ NORMAL LVSF/  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   CARPAL TUNNEL RELEASE Left 06/2016   CARPAL TUNNEL RELEASE Right 03/2017   COLONOSCOPY     last with medoff unsure when    CYSTOSCOPY WITH URETEROSCOPY AND STENT PLACEMENT Right 10/28/2013   Procedure: RIGHT URETEROSCOPY AND STENT PLACEMENT;  Surgeon: [name], MD;  Location: [facility];  Service: Urology;  Laterality: Right;   CYSTOSCOPY/URETEROSCOPY/HOLMIUM LASER/STENT PLACEMENT Left 09/06/2020   Procedure: CYSTOSCOPY/POSSIBLE URETEROSCOPY/HOLMIUM LASER/STENT PLACEMENT;  Surgeon: Remi Haggard, MD;  Location: [facility];  Service: Urology;  Laterality: Left;  30 MINS   EXTRACORPOREAL SHOCK WAVE LITHOTRIPSY  02-08-2009   HOLMIUM LASER APPLICATION Right 6/72/0947   Procedure: HOLMIUM LASER LITHO;  Surgeon: [name], MD;  Location: [facility];  Service: Urology;  Laterality: Right;   LEFT KNEE [place]   LEFT URETEROSCOPIC STONE EXTRACTION  02-23-2009   POLYPECTOMY  2014   1 polyp per dr Steve Rattler notes- no surg path to determine polyps type but was a 5 yr recall    RADIOACTIVE SEED IMPLANT  08/08/2011   Procedure: RADIOACTIVE SEED IMPLANT;  Surgeon: [name], MD;  [facility];  Service: Urology;  Laterality: N/A;  seeds implanted 50 seeds found in bladder=none    REPLACEMENT TOTAL KNEE Left 2018   SKIN CANCER EXCISION     Burned off left side of nose, and head   TOTAL KNEE ARTHROPLASTY Right 08/2018   UPPER GASTROINTESTINAL ENDOSCOPY     URETEROSCOPY  03/15/2012   Procedure: URETEROSCOPY;  Surgeon: [name], MD;  [facility]  Service: Urology;  Laterality: Right;  Right Ueteroscopy     Family History  Problem Relation Age of Onset   Cancer Mother    Diabetes Father    CAD Father    Heart attack Father    Diabetes Sister    Colon polyps Sister    Colon polyps Brother    Colon cancer Neg Hx    Esophageal cancer Neg Hx     Rectal cancer Neg Hx    Stomach cancer Neg Hx     Social History   Socioeconomic History   Marital status: Married    Spouse name: Not on file   Number of children: Not on file   Years of education: Not on file   Highest education level: Not on file  Occupational History   Not on file  Tobacco Use   Smoking status: Former    Years: 10.00    Types: Cigarettes    Quit date: 07/02/1993  Years since quitting: 28.6   Smokeless tobacco: Former    Types: Nurse, children's Use: Never used  Substance and Sexual Activity   Alcohol use: No   Drug use: No   Sexual activity: Not on file  Other Topics Concern   Not on file  Social History Narrative   Not on file   Social Determinants of Health   Financial Resource Strain: Not on file  Food Insecurity: Not on file  Transportation Needs: Not on file  Physical Activity: Not on file  Stress: Not on file  Social Connections: Not on file  Intimate Partner Violence: Not on file    Review of Systems  Respiratory:  Positive for apnea.   Psychiatric/Behavioral:  Positive for sleep disturbance.     Vitals:   02/27/22 1136  BP: 124/78  Pulse: 68  Temp: 98 F (36.7 C)  SpO2: 98%     Physical Exam Constitutional:      Appearance: He is obese.  HENT:     Head: Normocephalic.     Mouth/Throat:     Mouth: Mucous membranes are moist.  Cardiovascular:     Rate and Rhythm: Normal rate and regular rhythm.     Heart sounds: No murmur heard.    No friction rub.  Pulmonary:     Effort: No respiratory distress.     Breath sounds: No stridor. No wheezing or rhonchi.  Musculoskeletal:     Cervical back: No rigidity or tenderness.  Neurological:     Mental Status: He is alert.  Psychiatric:        Mood and Affect: Mood normal.    Data Reviewed: Compliance data not available today   Assessment:  History of obstructive sleep apnea -On CPAP nightly -Tolerating CPAP well -Benefiting from  CPAP  Insomnia -Currently on trazodone, does not seem to be working -May have tried Ambien 10 mg in the past -Discussed other options of treatment  Plan/Recommendations: Trial with Ambien 12.5  May try other sleep aids if Ambien does not work controlled  Continue using CPAP on a nightly basis  Tentative follow-up in 6 months  Encouraged to call with significant concerns   Sherrilyn Rist MD [facility] 02/27/2022, 12:00 PM  CC: Angelina Sheriff, MD

## 2022-03-05 DIAGNOSIS — R7302 Impaired glucose tolerance (oral): Secondary | ICD-10-CM | POA: Diagnosis not present

## 2022-03-05 DIAGNOSIS — I10 Essential (primary) hypertension: Secondary | ICD-10-CM | POA: Diagnosis not present

## 2022-03-05 DIAGNOSIS — R5383 Other fatigue: Secondary | ICD-10-CM | POA: Diagnosis not present

## 2022-03-05 DIAGNOSIS — Z23 Encounter for immunization: Secondary | ICD-10-CM | POA: Diagnosis not present

## 2022-03-05 DIAGNOSIS — G4733 Obstructive sleep apnea (adult) (pediatric): Secondary | ICD-10-CM | POA: Diagnosis not present

## 2022-03-05 DIAGNOSIS — E7800 Pure hypercholesterolemia, unspecified: Secondary | ICD-10-CM | POA: Diagnosis not present

## 2022-03-05 DIAGNOSIS — Z6841 Body Mass Index (BMI) 40.0 and over, adult: Secondary | ICD-10-CM | POA: Diagnosis not present

## 2022-03-05 DIAGNOSIS — Z1331 Encounter for screening for depression: Secondary | ICD-10-CM | POA: Diagnosis not present

## 2022-04-09 ENCOUNTER — Inpatient Hospital Stay (HOSPITAL_COMMUNITY): Payer: Medicare Other | Admitting: Anesthesiology

## 2022-04-09 ENCOUNTER — Inpatient Hospital Stay (HOSPITAL_COMMUNITY)
Admission: AD | Admit: 2022-04-09 | Discharge: 2022-04-12 | DRG: 660 | Disposition: A | Discharge status: Home / Self Care | Payer: Medicare Other | Attending: Urology | Admitting: Urology

## 2022-04-09 ENCOUNTER — Other Ambulatory Visit: Payer: Self-pay | Admitting: Urology

## 2022-04-09 ENCOUNTER — Encounter (HOSPITAL_COMMUNITY): Payer: Self-pay | Admitting: Urology

## 2022-04-09 ENCOUNTER — Encounter (HOSPITAL_COMMUNITY)
Admission: AD | Disposition: A | Discharge status: Home / Self Care | Payer: Self-pay | Source: Home / Self Care | Attending: Urology

## 2022-04-09 ENCOUNTER — Inpatient Hospital Stay (HOSPITAL_COMMUNITY): Payer: Medicare Other

## 2022-04-09 DIAGNOSIS — N201 Calculus of ureter: Secondary | ICD-10-CM | POA: Diagnosis present

## 2022-04-09 DIAGNOSIS — Z79899 Other long term (current) drug therapy: Secondary | ICD-10-CM | POA: Diagnosis not present

## 2022-04-09 DIAGNOSIS — Z87891 Personal history of nicotine dependence: Secondary | ICD-10-CM

## 2022-04-09 DIAGNOSIS — Z981 Arthrodesis status: Secondary | ICD-10-CM | POA: Diagnosis not present

## 2022-04-09 DIAGNOSIS — R3121 Asymptomatic microscopic hematuria: Secondary | ICD-10-CM | POA: Diagnosis not present

## 2022-04-09 DIAGNOSIS — I10 Essential (primary) hypertension: Secondary | ICD-10-CM

## 2022-04-09 DIAGNOSIS — Z87442 Personal history of urinary calculi: Secondary | ICD-10-CM

## 2022-04-09 DIAGNOSIS — Z20822 Contact with and (suspected) exposure to covid-19: Secondary | ICD-10-CM | POA: Diagnosis present

## 2022-04-09 DIAGNOSIS — N1330 Unspecified hydronephrosis: Secondary | ICD-10-CM | POA: Diagnosis not present

## 2022-04-09 DIAGNOSIS — N132 Hydronephrosis with renal and ureteral calculous obstruction: Secondary | ICD-10-CM | POA: Diagnosis not present

## 2022-04-09 DIAGNOSIS — Z888 Allergy status to other drugs, medicaments and biological substances status: Secondary | ICD-10-CM

## 2022-04-09 DIAGNOSIS — N3000 Acute cystitis without hematuria: Secondary | ICD-10-CM | POA: Diagnosis not present

## 2022-04-09 DIAGNOSIS — K5730 Diverticulosis of large intestine without perforation or abscess without bleeding: Secondary | ICD-10-CM | POA: Diagnosis not present

## 2022-04-09 DIAGNOSIS — Z886 Allergy status to analgesic agent status: Secondary | ICD-10-CM | POA: Diagnosis not present

## 2022-04-09 DIAGNOSIS — Z7984 Long term (current) use of oral hypoglycemic drugs: Secondary | ICD-10-CM

## 2022-04-09 DIAGNOSIS — R1084 Generalized abdominal pain: Secondary | ICD-10-CM | POA: Diagnosis not present

## 2022-04-09 DIAGNOSIS — B961 Klebsiella pneumoniae [K. pneumoniae] as the cause of diseases classified elsewhere: Secondary | ICD-10-CM | POA: Diagnosis not present

## 2022-04-09 DIAGNOSIS — N390 Urinary tract infection, site not specified: Secondary | ICD-10-CM | POA: Diagnosis not present

## 2022-04-09 DIAGNOSIS — Z7901 Long term (current) use of anticoagulants: Secondary | ICD-10-CM | POA: Diagnosis not present

## 2022-04-09 DIAGNOSIS — K439 Ventral hernia without obstruction or gangrene: Secondary | ICD-10-CM | POA: Diagnosis not present

## 2022-04-09 DIAGNOSIS — N202 Calculus of kidney with calculus of ureter: Secondary | ICD-10-CM | POA: Diagnosis not present

## 2022-04-09 DIAGNOSIS — Z8546 Personal history of malignant neoplasm of prostate: Secondary | ICD-10-CM | POA: Diagnosis not present

## 2022-04-09 HISTORY — DX: Urinary tract infection, site not specified: N39.0

## 2022-04-09 HISTORY — PX: CYSTOSCOPY WITH RETROGRADE PYELOGRAM, URETEROSCOPY AND STENT PLACEMENT: SHX5789

## 2022-04-09 LAB — COMPREHENSIVE METABOLIC PANEL
ALT: 24 U/L (ref 0–44)
AST: 25 U/L (ref 15–41)
Albumin: 3.6 g/dL (ref 3.5–5.0)
Alkaline Phosphatase: 36 U/L — ABNORMAL LOW (ref 38–126)
Anion gap: 13 (ref 5–15)
BUN: 26 mg/dL — ABNORMAL HIGH (ref 8–23)
CO2: 23 mmol/L (ref 22–32)
Calcium: 8.9 mg/dL (ref 8.9–10.3)
Chloride: 102 mmol/L (ref 98–111)
Creatinine, Ser: 2.32 mg/dL — ABNORMAL HIGH (ref 0.61–1.24)
GFR, Estimated: 29 mL/min — ABNORMAL LOW (ref >60)
Glucose, Bld: 150 mg/dL — ABNORMAL HIGH (ref 70–99)
Potassium: 4 mmol/L (ref 3.5–5.1)
Sodium: 138 mmol/L (ref 135–145)
Total Bilirubin: 1.5 mg/dL — ABNORMAL HIGH (ref 0.3–1.2)
Total Protein: 7.4 g/dL (ref 6.5–8.1)

## 2022-04-09 LAB — CBC WITH DIFFERENTIAL/PLATELET
Abs Immature Granulocytes: 0.31 10*3/uL — ABNORMAL HIGH (ref 0.00–0.07)
Basophils Absolute: 0.1 10*3/uL (ref 0.0–0.1)
Basophils Relative: 0 %
Eosinophils Absolute: 0.1 10*3/uL (ref 0.0–0.5)
Eosinophils Relative: 1 %
HCT: 42.4 % (ref 39.0–52.0)
Hemoglobin: 14.6 g/dL (ref 13.0–17.0)
Immature Granulocytes: 2 %
Lymphocytes Relative: 6 %
Lymphs Abs: 1.1 10*3/uL (ref 0.7–4.0)
MCH: 30.5 pg (ref 26.0–34.0)
MCHC: 34.4 g/dL (ref 30.0–36.0)
MCV: 88.5 fL (ref 80.0–100.0)
Monocytes Absolute: 1.4 10*3/uL — ABNORMAL HIGH (ref 0.1–1.0)
Monocytes Relative: 8 %
Neutro Abs: 14.7 10*3/uL — ABNORMAL HIGH (ref 1.7–7.7)
Neutrophils Relative %: 83 %
Platelets: 163 10*3/uL (ref 150–400)
RBC: 4.79 MIL/uL (ref 4.22–5.81)
RDW: 14.2 % (ref 11.5–15.5)
WBC: 17.7 10*3/uL — ABNORMAL HIGH (ref 4.0–10.5)
nRBC: 0 % (ref 0.0–0.2)

## 2022-04-09 LAB — GLUCOSE, CAPILLARY
Glucose-Capillary: 141 mg/dL — ABNORMAL HIGH (ref 70–99)
Glucose-Capillary: 129 mg/dL — ABNORMAL HIGH (ref 70–99)
Glucose-Capillary: 211 mg/dL — ABNORMAL HIGH (ref 70–99)

## 2022-04-09 SURGERY — CYSTOURETEROSCOPY, WITH RETROGRADE PYELOGRAM AND STENT INSERTION
Anesthesia: General | Laterality: Left

## 2022-04-09 MED ORDER — FAMOTIDINE 20 MG PO TABS
40.0000 mg | ORAL_TABLET | Freq: Every day | ORAL | Status: DC
  Administered 2022-04-09 – 2022-04-11 (×3): 40 mg via ORAL
  Filled 2022-04-09 (×3): qty 2

## 2022-04-09 MED ORDER — ONDANSETRON HCL 4 MG/2ML IJ SOLN
INTRAMUSCULAR | Status: DC | PRN
  Administered 2022-04-09: 4 mg via INTRAVENOUS

## 2022-04-09 MED ORDER — DIPHENHYDRAMINE HCL 50 MG/ML IJ SOLN: 12.5000 mg | Freq: Four times a day (QID) | INTRAMUSCULAR | Status: DC | PRN

## 2022-04-09 MED ORDER — SODIUM CHLORIDE 0.9 % IR SOLN
Status: DC | PRN
  Administered 2022-04-09: 3000 mL

## 2022-04-09 MED ORDER — PHENYLEPHRINE HCL-NACL 20-0.9 MG/250ML-% IV SOLN
INTRAVENOUS | Status: DC | PRN
  Administered 2022-04-09: 50 ug/min via INTRAVENOUS

## 2022-04-09 MED ORDER — SODIUM CHLORIDE 0.9 % IV SOLN: INTRAVENOUS | Status: DC

## 2022-04-09 MED ORDER — MORPHINE SULFATE (PF) 2 MG/ML IV SOLN: 2.0000 mg | INTRAVENOUS | Status: DC | PRN

## 2022-04-09 MED ORDER — ATORVASTATIN CALCIUM 10 MG PO TABS
10.0000 mg | ORAL_TABLET | Freq: Every day | ORAL | Status: DC
  Administered 2022-04-09 – 2022-04-11 (×3): 10 mg via ORAL
  Filled 2022-04-09 (×3): qty 1

## 2022-04-09 MED ORDER — SENNA 8.6 MG PO TABS
1.0000 | ORAL_TABLET | Freq: Two times a day (BID) | ORAL | Status: DC
  Administered 2022-04-09 – 2022-04-12 (×6): 8.6 mg via ORAL
  Filled 2022-04-09 (×6): qty 1

## 2022-04-09 MED ORDER — ROCURONIUM BROMIDE 10 MG/ML (PF) SYRINGE
PREFILLED_SYRINGE | INTRAVENOUS | Status: DC | PRN
  Administered 2022-04-09: 20 mg via INTRAVENOUS

## 2022-04-09 MED ORDER — OMEGA-3-ACID ETHYL ESTERS 1 G PO CAPS
4.0000 | ORAL_CAPSULE | Freq: Every day | ORAL | Status: DC
  Administered 2022-04-09 – 2022-04-11 (×3): 4 g via ORAL
  Filled 2022-04-09 (×4): qty 4

## 2022-04-09 MED ORDER — MIDAZOLAM HCL 2 MG/2ML IJ SOLN
INTRAMUSCULAR | Status: AC
  Filled 2022-04-09: qty 2

## 2022-04-09 MED ORDER — FLUTICASONE PROPIONATE 50 MCG/ACT NA SUSP: 2.0000 | Freq: Every day | NASAL | Status: DC | PRN

## 2022-04-09 MED ORDER — TRAZODONE HCL 100 MG PO TABS
100.0000 mg | ORAL_TABLET | Freq: Every day | ORAL | Status: DC
  Administered 2022-04-09 – 2022-04-11 (×3): 100 mg via ORAL
  Filled 2022-04-09 (×3): qty 1

## 2022-04-09 MED ORDER — ACETAMINOPHEN 10 MG/ML IV SOLN
INTRAVENOUS | Status: AC
  Filled 2022-04-09: qty 100

## 2022-04-09 MED ORDER — SUCCINYLCHOLINE CHLORIDE 200 MG/10ML IV SOSY
PREFILLED_SYRINGE | INTRAVENOUS | Status: AC
  Filled 2022-04-09: qty 10

## 2022-04-09 MED ORDER — ACETAMINOPHEN 10 MG/ML IV SOLN
INTRAVENOUS | Status: DC | PRN
  Administered 2022-04-09: 1000 mg via INTRAVENOUS

## 2022-04-09 MED ORDER — PANTOPRAZOLE SODIUM 40 MG PO TBEC
40.0000 mg | DELAYED_RELEASE_TABLET | Freq: Every day | ORAL | Status: DC
  Administered 2022-04-10 – 2022-04-12 (×3): 40 mg via ORAL
  Filled 2022-04-09 (×3): qty 1

## 2022-04-09 MED ORDER — PROPOFOL 10 MG/ML IV BOLUS
INTRAVENOUS | Status: DC | PRN
  Administered 2022-04-09: 140 mg via INTRAVENOUS

## 2022-04-09 MED ORDER — LORATADINE 10 MG PO TABS
10.0000 mg | ORAL_TABLET | Freq: Every day | ORAL | Status: DC
  Administered 2022-04-10 – 2022-04-12 (×3): 10 mg via ORAL
  Filled 2022-04-09 (×3): qty 1

## 2022-04-09 MED ORDER — 0.9 % SODIUM CHLORIDE (POUR BTL) OPTIME
TOPICAL | Status: DC | PRN
  Administered 2022-04-09: 1000 mL

## 2022-04-09 MED ORDER — MIDAZOLAM HCL 2 MG/2ML IJ SOLN
INTRAMUSCULAR | Status: DC | PRN
  Administered 2022-04-09 (×2): 1 mg via INTRAVENOUS

## 2022-04-09 MED ORDER — TRAMADOL HCL 50 MG PO TABS: 50.0000 mg | ORAL_TABLET | Freq: Two times a day (BID) | ORAL | Status: DC | PRN

## 2022-04-09 MED ORDER — OXYBUTYNIN CHLORIDE 5 MG PO TABS: 5.0000 mg | ORAL_TABLET | Freq: Three times a day (TID) | ORAL | Status: DC | PRN

## 2022-04-09 MED ORDER — LISINOPRIL 10 MG PO TABS
10.0000 mg | ORAL_TABLET | Freq: Every day | ORAL | Status: DC
  Administered 2022-04-10 – 2022-04-12 (×3): 10 mg via ORAL
  Filled 2022-04-09 (×3): qty 1

## 2022-04-09 MED ORDER — FENTANYL CITRATE (PF) 100 MCG/2ML IJ SOLN
INTRAMUSCULAR | Status: DC | PRN
  Administered 2022-04-09 (×2): 50 ug via INTRAVENOUS

## 2022-04-09 MED ORDER — LIDOCAINE 2% (20 MG/ML) 5 ML SYRINGE
INTRAMUSCULAR | Status: DC | PRN
  Administered 2022-04-09: 100 mg via INTRAVENOUS

## 2022-04-09 MED ORDER — FENTANYL CITRATE (PF) 100 MCG/2ML IJ SOLN
INTRAMUSCULAR | Status: AC
  Filled 2022-04-09: qty 2

## 2022-04-09 MED ORDER — PHENYLEPHRINE HCL (PRESSORS) 10 MG/ML IV SOLN
INTRAVENOUS | Status: DC | PRN
  Administered 2022-04-09: 160 ug via INTRAVENOUS
  Administered 2022-04-09: 80 ug via INTRAVENOUS

## 2022-04-09 MED ORDER — CHLORHEXIDINE GLUCONATE 0.12 % MT SOLN
15.0000 mL | Freq: Once | OROMUCOSAL | Status: AC
  Administered 2022-04-09: 15 mL via OROMUCOSAL

## 2022-04-09 MED ORDER — TRIPLE ANTIBIOTIC 3.5-400-5000 EX OINT: 1.0000 | TOPICAL_OINTMENT | Freq: Three times a day (TID) | CUTANEOUS | Status: DC | PRN

## 2022-04-09 MED ORDER — RIVAROXABAN 15 MG PO TABS
15.0000 mg | ORAL_TABLET | Freq: Every day | ORAL | Status: DC
  Administered 2022-04-10 – 2022-04-11 (×2): 15 mg via ORAL
  Filled 2022-04-09 (×2): qty 1

## 2022-04-09 MED ORDER — INSULIN ASPART 100 UNIT/ML IJ SOLN
0.0000 [IU] | Freq: Three times a day (TID) | INTRAMUSCULAR | Status: DC
  Administered 2022-04-10 (×3): 3 [IU] via SUBCUTANEOUS
  Administered 2022-04-11 (×2): 2 [IU] via SUBCUTANEOUS
  Administered 2022-04-11 – 2022-04-12 (×3): 3 [IU] via SUBCUTANEOUS

## 2022-04-09 MED ORDER — LIDOCAINE HCL (PF) 2 % IJ SOLN
INTRAMUSCULAR | Status: AC
  Filled 2022-04-09: qty 5

## 2022-04-09 MED ORDER — PROPOFOL 10 MG/ML IV BOLUS
INTRAVENOUS | Status: AC
  Filled 2022-04-09: qty 20

## 2022-04-09 MED ORDER — DOCUSATE SODIUM 100 MG PO CAPS
100.0000 mg | ORAL_CAPSULE | Freq: Two times a day (BID) | ORAL | Status: DC
  Administered 2022-04-09 – 2022-04-12 (×6): 100 mg via ORAL
  Filled 2022-04-09 (×6): qty 1

## 2022-04-09 MED ORDER — ACETAMINOPHEN 325 MG PO TABS
650.0000 mg | ORAL_TABLET | ORAL | Status: DC | PRN
  Administered 2022-04-10 – 2022-04-11 (×3): 650 mg via ORAL
  Filled 2022-04-09 (×3): qty 2

## 2022-04-09 MED ORDER — SUCCINYLCHOLINE CHLORIDE 200 MG/10ML IV SOSY
PREFILLED_SYRINGE | INTRAVENOUS | Status: DC | PRN
  Administered 2022-04-09: 80 mg via INTRAVENOUS

## 2022-04-09 MED ORDER — SUGAMMADEX SODIUM 200 MG/2ML IV SOLN
INTRAVENOUS | Status: DC | PRN
  Administered 2022-04-09: 400 mg via INTRAVENOUS

## 2022-04-09 MED ORDER — IOHEXOL 300 MG/ML  SOLN
INTRAMUSCULAR | Status: DC | PRN
  Administered 2022-04-09: 18 mL via URETHRAL

## 2022-04-09 MED ORDER — ZOLPIDEM TARTRATE 5 MG PO TABS: 5.0000 mg | ORAL_TABLET | Freq: Every evening | ORAL | Status: DC | PRN

## 2022-04-09 MED ORDER — SODIUM CHLORIDE 0.9 % IV SOLN
2.0000 g | INTRAVENOUS | Status: AC
  Administered 2022-04-09: 2 g via INTRAVENOUS
  Filled 2022-04-09: qty 20

## 2022-04-09 MED ORDER — DIPHENHYDRAMINE HCL 12.5 MG/5ML PO ELIX: 12.5000 mg | ORAL_SOLUTION | Freq: Four times a day (QID) | ORAL | Status: DC | PRN

## 2022-04-09 MED ORDER — DEXAMETHASONE SODIUM PHOSPHATE 10 MG/ML IJ SOLN
INTRAMUSCULAR | Status: DC | PRN
  Administered 2022-04-09: 5 mg via INTRAVENOUS

## 2022-04-09 MED ORDER — PHENYLEPHRINE HCL (PRESSORS) 10 MG/ML IV SOLN
INTRAVENOUS | Status: AC
  Filled 2022-04-09: qty 1

## 2022-04-09 MED ORDER — SODIUM CHLORIDE 0.9 % IV SOLN
2.0000 g | INTRAVENOUS | Status: DC
  Administered 2022-04-10 – 2022-04-11 (×2): 2 g via INTRAVENOUS
  Filled 2022-04-09 (×2): qty 20

## 2022-04-09 MED ORDER — VITAMIN D 25 MCG (1000 UNIT) PO TABS
1000.0000 [IU] | ORAL_TABLET | Freq: Every day | ORAL | Status: DC
  Administered 2022-04-10 – 2022-04-12 (×3): 1000 [IU] via ORAL
  Filled 2022-04-09 (×3): qty 1

## 2022-04-09 MED ORDER — ONDANSETRON HCL 4 MG/2ML IJ SOLN: 4.0000 mg | INTRAMUSCULAR | Status: DC | PRN

## 2022-04-09 MED ORDER — MELATONIN 5 MG PO TABS
5.0000 mg | ORAL_TABLET | Freq: Every day | ORAL | Status: DC
  Administered 2022-04-09 – 2022-04-11 (×3): 5 mg via ORAL
  Filled 2022-04-09 (×3): qty 1

## 2022-04-09 SURGICAL SUPPLY — 25 items
BAG URO CATCHER STRL LF (MISCELLANEOUS) ×1 IMPLANT
BASKET LASER NITINOL 1.9FR (BASKET) IMPLANT
BASKET ZERO TIP NITINOL 2.4FR (BASKET) IMPLANT
CATH URETERAL DUAL LUMEN 10F (MISCELLANEOUS) IMPLANT
CATH URETL OPEN END 6FR 70 (CATHETERS) ×1 IMPLANT
CLOTH BEACON ORANGE TIMEOUT ST (SAFETY) ×1 IMPLANT
EXTRACTOR STONE 1.7FRX115CM (UROLOGICAL SUPPLIES) IMPLANT
GLOVE BIO SURGEON STRL SZ7.5 (GLOVE) ×1 IMPLANT
GLOVE BIO SURGEON STRL SZ8 (GLOVE) IMPLANT
GLOVE BIOGEL PI IND STRL 8 (GLOVE) IMPLANT
GOWN STRL REUS W/ TWL XL LVL3 (GOWN DISPOSABLE) ×1 IMPLANT
GOWN STRL REUS W/TWL XL LVL3 (GOWN DISPOSABLE) ×2
GUIDEWIRE ANG ZIPWIRE 038X150 (WIRE) IMPLANT
GUIDEWIRE STR DUAL SENSOR (WIRE) ×1 IMPLANT
KIT TURNOVER KIT A (KITS) IMPLANT
LASER FIB FLEXIVA PULSE ID 365 (Laser) IMPLANT
MANIFOLD NEPTUNE II (INSTRUMENTS) ×1 IMPLANT
PACK CYSTO (CUSTOM PROCEDURE TRAY) ×1 IMPLANT
SHEATH NAVIGATOR HD 11/13X28 (SHEATH) IMPLANT
SHEATH NAVIGATOR HD 11/13X36 (SHEATH) IMPLANT
STENT URET 6FRX26 CONTOUR (STENTS) IMPLANT
TRACTIP FLEXIVA PULS ID 200XHI (Laser) IMPLANT
TRACTIP FLEXIVA PULSE ID 200 (Laser)
TUBING CONNECTING 10 (TUBING) ×1 IMPLANT
TUBING UROLOGY SET (TUBING) ×1 IMPLANT

## 2022-04-09 NOTE — Anesthesia Preprocedure Evaluation (Addendum)
Anesthesia Evaluation  Patient identified by MRN, date of birth, ID band Patient awake    Reviewed: Allergy & Precautions, NPO status , Patient's Chart, lab work & pertinent test results  History of Anesthesia Complications (+) history of anesthetic complications  Airway Mallampati: III  TM Distance: >3 FB Neck ROM: Full    Dental  (+) Dental Advisory Given,    Pulmonary sleep apnea and Continuous Positive Airway Pressure Ventilation , former smoker   Pulmonary exam normal breath sounds clear to auscultation       Cardiovascular hypertension, Pt. on medications Normal cardiovascular exam Rhythm:Regular Rate:Normal  EKG 04/19/20 NSR, Normal  Echo 02/03/19  1. The left ventricle has normal systolic function with an ejection fraction of 60-65%. The cavity size was mildly dilated. Left ventricular diastolic Doppler parameters are consistent with impaired relaxation.   2. The right ventricle has normal systolic function. The cavity was normal.   3. The mitral valve is grossly normal.   4. The tricuspid valve is grossly normal.   5. The aortic valve is tricuspid. Mild thickening of the aortic valve.  Aortic valve regurgitation is mild by color flow Doppler. No stenosis of the aortic valve.   6. The aorta is normal unless otherwise noted.   7. Technically difficult; definity used; mild to moderate global reduction in LV systolic function (EF 45); sclerotic aortic valve with mild AI.    NM Stress  The left ventricular ejection fraction is normal (55-65%).  Nuclear stress EF: 55%.  There was no ST segment deviation noted during stress.  The study is normal.  This is a low risk study.    Neuro/Psych  Neuromuscular disease  negative psych ROS   GI/Hepatic Neg liver ROS,GERD  Medicated and Controlled,,  Endo/Other  diabetes, Well Controlled, Type 2, Oral Hypoglycemic Agents  Morbid obesityHyperlipidemia  Renal/GU Renal  diseaseLeft ureteral calculus   Hx/o prostate Ca S/P Seed placement    Musculoskeletal  (+) Arthritis , Osteoarthritis,  Lumbar radiculopathy   Abdominal  (+) + obese  Peds  Hematology negative hematology ROS (+) Xarelto therapy- last dose this am   Anesthesia Other Findings   Reproductive/Obstetrics                             Anesthesia Physical Anesthesia Plan  ASA: 3  Anesthesia Plan: General   Post-op Pain Management: Ofirmev IV (intra-op)*   Induction: Intravenous, Rapid sequence and Cricoid pressure planned  PONV Risk Score and Plan: 4 or greater and Treatment may vary due to age or medical condition, Ondansetron and Dexamethasone  Airway Management Planned: Oral ETT and Video Laryngoscope Planned  Additional Equipment: None  Intra-op Plan:   Post-operative Plan: Extubation in OR  Informed Consent: I have reviewed the patients History and Physical, chart, labs and discussed the procedure including the risks, benefits and alternatives for the proposed anesthesia with the patient or authorized representative who has indicated his/her understanding and acceptance.     Dental advisory given  Plan Discussed with: CRNA  Anesthesia Plan Comments:         Anesthesia Quick Evaluation

## 2022-04-09 NOTE — Anesthesia Procedure Notes (Signed)
Procedure Name: Intubation Date/Time: 04/09/2022 7:01 PM  Performed by: Cynda Familia, CRNAPre-anesthesia Checklist: Patient identified, Emergency Drugs available, Suction available and Patient being monitored Patient Re-evaluated:Patient Re-evaluated prior to induction Oxygen Delivery Method: Circle System Utilized Preoxygenation: Pre-oxygenation with 100% oxygen Induction Type: IV induction, Rapid sequence and Inhalational induction with existing ETT Ventilation: Mask ventilation without difficulty Laryngoscope Size: Glidescope Grade View: Grade II Tube type: Oral Number of attempts: 1 Airway Equipment and Method: Stylet Placement Confirmation: ETT inserted through vocal cords under direct vision, positive ETCO2 and breath sounds checked- equal and bilateral Secured at: 22 cm Tube secured with: Tape Dental Injury: Teeth and Oropharynx as per pre-operative assessment  Comments: IV induction germeroth-- intubation AM CRNA atraumatic-- teeth and mouth as preop- bilat BS Germeroth

## 2022-04-09 NOTE — Discharge Instructions (Signed)

## 2022-04-09 NOTE — Anesthesia Procedure Notes (Signed)
Date/Time: 04/09/2022 7:33 PM  Performed by: Cynda Familia, CRNAOxygen Delivery Method: Simple face mask Placement Confirmation: breath sounds checked- equal and bilateral and positive ETCO2 Dental Injury: Teeth and Oropharynx as per pre-operative assessment

## 2022-04-09 NOTE — H&P (Signed)
CC/HPI: had prostate cancer treated.  HPI: Joseph Pham is a 71 year-old male established patient who is here for prostate cancer which has been treated.   His PSA at his time of diagnosis was 5.53. His cancer was T2a, Gleason score 4+3 = 7 in 1 core, 3+4 = 7 in 4 cores and 3+3 = 6 in 3 cores. His most recent PSA is <0.015. His PSA results have been low since his prostate cancer treatment.   He received radiation therapy for his cancer. He was treated with Brachytherapy for his cancer. His radiation treatment was complete 08/08/2011. He has not undergone Hormonal Therapy for treatment. He does not have urinary incontinence.   He has not seen blood in his stool since the biopsy. He is not having new bone pain. He has not recently had unwanted weight loss. This condition would be considered of mild to moderate severity with no modifying factors or associated signs or symptoms other than as noted above.   Adenocarcinoma of the prostate: His PSA in 10/11 was 5.9. A repeat PSA in 5/12 was noted to be 5.53 with a free to total ratio of 5%. He was also noted on DRE to have induration in the right apex of the prostate. Prostate vol. - 26.6cc.  Path. - adenocarcinoma Gleason score 4+3 = 7 in 1 core, 3+4 = 7 in 4 cores and 3+3 = 6 in 3 cores for a total of 8/12 cores positive.  Clinical stage T2a.  Treatment: I-125 seed implant on 08/08/11.   04/20/18: He reports to me that since I have seen him last he has had 04/26/19: He returns today for annual follow-up of prostate cancer treated with I 125 seeds in 3/13. He has been doing well over the past year from urologic standpoint with no new urologic complaints. In addition he has not had any weight loss or unexplained bone pain. Did have a PE and is currently on Xarelto. His PSA went from undetectable to just slightly detectable last year but I rechecked it and it had returned to undetectable.   -04/25/20-patient with history of adenocarcinoma the prostate status  post interstitial brachytherapy in March 2013. PSAs have been undetectable last PSA was undetectable on 04/18/2020. Here for follow-up. No interim GU issues except that he had an episode of dark urine earlier this morning. Has had no previous episodes of hematuria.  Micro urinalysis today shows greater than 60 RBCs  -05/23/20-patient with history of adenocarcinoma the prostate status post interstitial brachytherapy in March 2013 as above. Has had some recent gross hematuria and has had CT renal scan in the interim on 05/04/2020. This is reviewed and shows bilateral small probable renal cyst but also has significant stone burden in the left kidney with several stones largest being 1.3 cm stone left renal pelvis area. They are nonobstructing. No significant stones noted on the right. Here for cysto to assess bladder. Micro urinalysis today is clear on urine spun sediment.  Cysto performed today and shows: Normal pendulous prostatic urethral area. Bladder was inspected show no mucosal lesions.  -09/03/20-patient with history of adenocarcinoma the prostate status post interstitial brachytherapy in March 2013 as above also has had recent hematuria felt secondary to probable left renal calculi noted on CT scan in December of 2021. We managed conservatively. He presents today with   09/13/2020 71 year old male who underwent a left-sided ureteroscopy 1 week ago. The procedure was uncomplicated and he tolerated it well. He presents today for stent removal. He denies  nausea, vomiting. He denies fevers and chills. He is not having any flank pain or discomfort. He does report some irritative voiding symptoms.  -11/21/20-patient status post recent left ureteroscopy with laser on 09/06/2020. Stent removed on 09/13/2020. Is now here for follow-up of his prostate cancer. He underwent interstitial brachytherapy in March 2013 PSAs have been undetectable. Last PSA 04/18/2020.  KUB is reviewed today shows no obvious ureteral  stone. There is a persistent 8 mm calcification overlying the left lower pole area consistent with known stone in the left kidney. No interim GU issues or hematuria.  -05/15/21-patient with history of nephrolithiasis also history of adenocarcinoma the prostate status post interstitial brachytherapy in 2013. PSAs have been undetectable. Last PSA undetectable on 05/10/2021. No interim stone issues. Here for follow-up KUB.  Micro urinalysis  KUB is reviewed today and shows: Persistent calcification over the left lower calyceal area measuring about 1 cm in size.   -11/22/21-patient with known left renal calculus and history of adenocarcinoma the prostate status post interstitial brachytherapy in 2013. PSAs been undetectable with last value being undetectable on 05/10/2021. No interim stone issues/hematuria. Here for follow-up KUB. Anticoagulation and would be at high risk for interventional treatment of his stone.  Micro urinalysis shows 6-10 WBCs greater than 60 RBCs and rare bacteria.  KUB is reviewed today and shows: Spinal fusion rods L4-5, interstitial brachytherapy seeds noted. There is persistent calcification approximately 8-76m in size overlying the left lower pole area of the left renal contour which is unchanged. No stone seen overlying the right renal shadow or over the expected course of the ureters.   04/09/2022  History as above. Briefly, has a history of prostate cancer status post brachytherapy in 2013. Also has a history of nephrolithiasis and has undergone multiple stone procedures in the past. Last underwent left ureteroscopy on 09/06/2020. He comes in today with a 1 day history of severe left-sided flank pain, nausea, vomiting. Also had shaking and chills last night. Urinalysis consistent with UTI. Temperature 100.6, heart rate 95.     ALLERGIES: Aspirin TABS Trimox CAPS    MEDICATIONS: Allegra 60 mg capsule Oral  Atorvastatin Calcium 10 mg tablet Oral  Famotidine 40 mg tablet  Fish  Oil CAPS Oral  Lisinopril 10 mg tablet Oral  Melatonin 5 mg capsule Oral  Metformin Hcl 500 mg tablet Oral  Multivitamin/Iron TABS Oral  Omega 3  Rosuvastatin Calcium 10 mg tablet  Tramadol Hcl  Trazodone Hcl 100 mg tablet  Vitamin B Complex  Vitamin B12  Vitamin C  Xarelto 20 mg tablet     GU PSH: Cysto Remove Stent FB Sim - 2022 Cysto Uretero Balloon Dil Strict - 2015 Cysto Uretero Lithotripsy - 2015, 2013, 2010 Cystoscopy - 05/23/2020, 2017 Cystoscopy Insert Stent - 2015, 2013, 2010 ESWL - 2015, 2010 Locm 300-'399Mg'$ /Ml Iodine,1Ml - 05/04/2020 Prostate Needle Biopsy - 2012 Ureteroscopic laser litho - 2022       PSH Notes: Lithotripsy, Cystoscopy With Ureteroscopy With Lithotripsy, Cystourethroscopy W/ Ureteroscopy W/ Tx Of Ureteral Strict, Cystoscopy With Insertion Of Ureteral Stent Right, Cystoscopy With Ureteroscopy With Lithotripsy, Cystoscopy With Insertion Of Ureteral Stent Right, Radiation Therapy, Biopsy Of The Prostate Needle, Cystoscopy With Pyeloscopy With Lithotripsy, Lithotripsy, Cystoscopy With Insertion Of Ureteral Stent Left, Hernia Repair, Knee Surgery   NON-GU PSH: Carpal Tunnel Surgery..Marland KitchenHernia Repair - 2010 Knee replacement, Left, 08/2018     GU PMH: History of prostate cancer - 11/22/2021, - 11/21/2020, - 05/23/2020, - 04/25/2020 (Stable), His prostate remains smooth and  benign. His PSA is undetectable and therefore he will return again in a year for DRE and PSA., - 2020 (Stable), His prostate was completely benign to exam but his PSA has increased slightly from where was a year ago. It still remains exceedingly low. I told him because of this I wanted to recheck the PSA again in 6 months and then will see him back again in 12 months for another PSA and DRE at that time., - 2019 (Stable), His prostate remains benign to exam with a PSA that remains exceedingly low. I have therefore recommended continued yearly DRE and PSA., - 2018 (Stable), Prostate is flat and  smooth. In addition his PSA remains exceedingly low. I told him that this likely indicates complete care. I will continue to monitor him on a yearly basis with DRE and PSA., - 2017 Microscopic hematuria - 11/22/2021 Renal calculus - 11/22/2021, - 05/15/2021 (Stable), - 11/21/2020, - 2022 (Stable), - 05/23/2020, - 2017 (Stable), Left, His left renal calculus remained stable with no evidence of metabolic stone activity. It sounds as if he may of passed a small stone recently. That would account for the fact that I didn't see a calcification last year that was not present on his KUB within the left kidney this year., - 2017, Bilateral kidney stones, - 2015 Acute Cystitis/UTI - 05/15/2021 Gross hematuria - 05/15/2021, - 05/23/2020, - 05/04/2020, - 04/25/2020 (Stable), I have reassured the patient that with no abnormality of the upper tract and no abnormality of the bladder that his hematuria is almost certainly coming from the thinned superficial blood vessels of his prostatic urethra secondary to the effects of radiation. I did not recommend any form of surgical or pharmacologic therapy but if he continues to have recurrent bleeding he will contact me. Otherwise I have reassured him. 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  He had lithotripsy of the stone previously without fragmentation.  Right ureteroscopy and laser lithotripsy of a UPJ stone in10/13.  Right ureteroscopy and laser lithotripsy in 5/15.  ESWL 7/15 - right renal pelvis.  Stone analysis: 100% calcium oxalate  Stone risk: Normal serum studies. 24 hour urine showed hypocitraturia and low urine volume.   Erectile dysfunction: He developed erectile dysfunction which was likely secondary to both a combination of his diabetes but more likely from the effects of his prostate radiation.  Current therapy: Viagra     NON-GU PMH: Stenosis of anus and rectum, Rectal/anal stenosis - 2015 Encounter for general adult medical examination without abnormal findings, Encounter for preventive health examination - 2015 Personal history of other endocrine, nutritional and metabolic disease, History of type 2 diabetes mellitus - 2015    FAMILY HISTORY: No pertinent family history - Other   SOCIAL HISTORY: Marital Status: Married Preferred Language: English; Ethnicity: Not Hispanic Or Latino;  Race: White Current Smoking Status: Patient does not smoke anymore.  Does drink.  Drinks 1 caffeinated drink per day.     Notes: Former smoker, Alcohol Use, Tobacco Use, Occupation:, Caffeine Use, Marital History - Currently Married   REVIEW OF SYSTEMS:    GU Review Male:   Patient denies frequent urination, hard to postpone urination, burning/ pain with urination, get up at night to urinate, leakage of urine, stream starts and stops, trouble starting your stream, have to strain to urinate , erection problems, and penile pain.  Gastrointestinal (Upper):   Patient denies nausea, vomiting, and indigestion/ heartburn.  Gastrointestinal (Lower):   Patient denies diarrhea and constipation.  Constitutional:   Patient denies fever, night sweats, weight loss, and fatigue.  Skin:   Patient denies skin rash/ lesion and itching.  Eyes:   Patient denies blurred vision  and double vision.  Ears/ Nose/ Throat:   Patient denies sore throat and sinus problems.  Hematologic/Lymphatic:   Patient denies swollen glands and easy bruising.  Cardiovascular:   Patient denies leg swelling and chest pains.  Respiratory:   Patient denies cough and shortness of breath.  Endocrine:   Patient denies excessive thirst.  Musculoskeletal:   Patient denies back pain and joint pain.  Neurological:   Patient denies dizziness and headaches.  Psychologic:   Patient denies depression and anxiety.   VITAL SIGNS:      04/09/2022 01:21 PM  BP 124/67 mmHg  Heart Rate 95 /min  Temperature 100.6 F / 38.1 C   MULTI-SYSTEM PHYSICAL EXAMINATION:    Gastrointestinal: No mass, no tenderness, no rigidity, obese abdomen. No CVA tenderness     Complexity of Data:  Source Of History:  Patient  Records Review:   Previous Doctor Records, Previous Patient Records  Urine Test Review:   Urinalysis  X-Ray Review: C.T. Abdomen/Pelvis: Reviewed Films. Reviewed Report. Discussed With Patient.     05/10/21 04/18/20 04/25/19 10/19/18 04/14/18 04/16/17 04/21/16 04/24/15  PSA  Total PSA <0.015 ng/mL <0.015 ng/mL <0.015 ng/mL <0.015 ng/mL 0.12 ng/mL <0.015 ng/mL 0.021 ng/dl 0.04     PROCEDURES:         C.T. Urogram - [id number]      Patient confirmed No Neulasta OnPro Device.         Urinalysis w/Scope Dipstick Dipstick Cont'd Micro  Color: Yellow Bilirubin: Neg mg/dL WBC/hpf: >60/hpf  Appearance: Turbid Ketones: Neg mg/dL RBC/hpf: 20 - 40/hpf  Specific Gravity: 1.025 Blood: 3+ ery/uL Bacteria: Many (>50/hpf)  pH: 5.5 Protein: 1+ mg/dL Cystals: NS (Not Seen)  Glucose: Neg mg/dL Urobilinogen: 0.2 mg/dL Casts: NS (Not Seen)    Nitrites: Neg Trichomonas: Not Present    Leukocyte Esterase: 3+ leu/uL Mucous: Not Present      Epithelial Cells: NS (Not Seen)      Yeast: NS (Not Seen)      Sperm: Not Present    ASSESSMENT:      ICD-10 Details  1 GU:   Flank Pain - R10.84 Undiagnosed New Problem   2   Microscopic hematuria - R31.21 Undiagnosed New Problem  3   Acute Cystitis/UTI - N30.00 Undiagnosed New Problem  4   Renal and ureteral calculus - N20.2 Undiagnosed New Problem   PLAN:           Orders Labs Urine Culture  X-Rays: C.T. Stone Protocol Without I.V. Contrast  X-Ray Notes: History:  Hematuria: Yes/No  Patient to see MD after exam: Yes/No  Previous exam: CT / IVP/ US/ KUB/ None  When:  Where:  Diabetic: Yes/ No  BUN/ Creatinine:  Date of last BUN Creatinine:  Weight in pounds:  Allergy- IV Contrast: Yes/ No  Conflicting diabetic meds: Yes/ No  Diabetic Meds:  Prior Authorization #: [id number]            Schedule         Document Letter(s):  Created for Patient: Clinical Summary         Notes:   Patient has an obstructing left distal ureteral calculus and multiple left ureteral calculi And has concerns for UTI on urinalysis. Plan for urgent ureteral stent placement this evening. Risk benefits discussed.   CC: Dr. Lin Landsman  Dr. Milford Cage        Next Appointment:      Next Appointment: 05/15/2022 02:00 PM    Appointment Type: Laboratory Appointment    Location: [facility] [phone]    Provider: Lab LAB    Reason for Visit: 6 mo PSA      Signed by Link Snuffer, III, M.D. on 04/09/22 at 2:58 PM (EST

## 2022-04-09 NOTE — Anesthesia Postprocedure Evaluation (Signed)
Anesthesia Post Note  Patient: Joseph Pham  Procedure(s) Performed: CYSTOSCOPY WITH RETROGRADE PYELOGRAM AND STENT PLACEMENT (Left)     Patient location during evaluation: PACU Anesthesia Type: General Level of consciousness: sedated and patient cooperative Pain management: pain level controlled Vital Signs Assessment: post-procedure vital signs reviewed and stable Respiratory status: spontaneous breathing Cardiovascular status: stable Anesthetic complications: no   No notable events documented.  Last Vitals:  Vitals:   04/09/22 2020 04/09/22 2048  BP: (!) 100/58 (!) 103/57  Pulse: 71 71  Resp: (!) 21 19  Temp: 36.9 C 37.2 C  SpO2: 97% 95%    Last Pain:  Vitals:   04/09/22 2050  TempSrc:   PainSc: 2                  Nolon Nations

## 2022-04-09 NOTE — Op Note (Signed)
Operative Note  Preoperative diagnosis:  1.  Left ureteral calculus 2.  UTI  Post operative diagnosis: 1.  Left ureteral calculus 2.  UTI  Procedure(s): 1.  Cystoscopy with left retrograde pyelogram and left ureteral stent placement  Surgeon: Link Snuffer, MD  Assistants: None  Anesthesia: General  Complications: None immediate  EBL: Minimal  Specimens: 1.  Urine culture through the cystoscope  Drains/Catheters: 1.  6 X 26 double-J ureteral stent 2.  Foley catheter  Intraoperative findings: 1.  Normal urethra and bladder 2.  Left retrograde pyelogram revealed a filling defect at the level of the stone with upstream hydroureteronephrosis 3.  Large amount of purulent efflux after placement of stent  Indication: 71 year old male with an obstructing left ureteral calculus and concerns for UTI.  Presents for urgent ureteral stent placement.  Description of procedure:  The patient was identified and consent was obtained.  The patient was taken to the operating room and placed in the supine position.  The patient was placed under general anesthesia.  Perioperative antibiotics were administered.  The patient was placed in dorsal lithotomy.  Patient was prepped and draped in a standard sterile fashion and a timeout was performed.  A 21 French rigid cystoscope was advanced into the urethra and into the bladder.  The left distal most portion of the ureter was cannulated with an open-ended ureteral catheter.  Retrograde pyelogram was performed with the findings noted above.  A sensor wire was then advanced up to the kidney under fluoroscopic guidance.  A 6 X 26 double-J ureteral stent was advanced up to the kidney under fluoroscopic guidance.  The wire was withdrawn and fluoroscopy confirmed good proximal placement and direct visualization confirmed a good coil within the bladder.  There was a large amount of purulent  efflux and the specimen was obtained through the scope.  This was sent  for culture.  I withdrew the scope and a Foley catheter was placed.  This concluded the operation.  Patient tolerated procedure well and was stable postoperatively.  Plan: Follow-up in 2 to 3 weeks for ureteroscopy.  We will keep him admitted to the hospital and continue ceftriaxone.  Follow-up on final cultures.

## 2022-04-09 NOTE — Transfer of Care (Signed)
Immediate Anesthesia Transfer of Care Note  Patient: Joseph Pham  Procedure(s) Performed: CYSTOSCOPY WITH RETROGRADE PYELOGRAM AND STENT PLACEMENT (Left)  Patient Location: PACU  Anesthesia Type:General  Level of Consciousness: awake  Airway & Oxygen Therapy: Patient Spontanous Breathing and Patient connected to face mask oxygen  Post-op Assessment: Report given to RN and Post -op Vital signs reviewed and stable  Post vital signs: Reviewed and stable  Last Vitals:  Vitals Value Taken Time  BP 107/55 04/09/22 1939  Temp    Pulse 71 04/09/22 1943  Resp 26 04/09/22 1943  SpO2 94 % 04/09/22 1943  Vitals shown include unvalidated device data.  Last Pain:  Vitals:   04/09/22 1631  TempSrc: Oral  PainSc: 6          Complications: No notable events documented.

## 2022-04-10 ENCOUNTER — Encounter (HOSPITAL_COMMUNITY): Payer: Self-pay | Admitting: Urology

## 2022-04-10 ENCOUNTER — Other Ambulatory Visit: Payer: Self-pay

## 2022-04-10 LAB — CBC
HCT: 36.7 % — ABNORMAL LOW (ref 39.0–52.0)
Hemoglobin: 12.3 g/dL — ABNORMAL LOW (ref 13.0–17.0)
MCH: 30 pg (ref 26.0–34.0)
MCHC: 33.5 g/dL (ref 30.0–36.0)
MCV: 89.5 fL (ref 80.0–100.0)
Platelets: 139 10*3/uL — ABNORMAL LOW (ref 150–400)
RBC: 4.1 MIL/uL — ABNORMAL LOW (ref 4.22–5.81)
RDW: 14.5 % (ref 11.5–15.5)
WBC: 16.5 10*3/uL — ABNORMAL HIGH (ref 4.0–10.5)
nRBC: 0 % (ref 0.0–0.2)

## 2022-04-10 LAB — BASIC METABOLIC PANEL
Anion gap: 6 (ref 5–15)
BUN: 26 mg/dL — ABNORMAL HIGH (ref 8–23)
CO2: 20 mmol/L — ABNORMAL LOW (ref 22–32)
Calcium: 6.2 mg/dL — CL (ref 8.9–10.3)
Chloride: 114 mmol/L — ABNORMAL HIGH (ref 98–111)
Creatinine, Ser: 1.72 mg/dL — ABNORMAL HIGH (ref 0.61–1.24)
GFR, Estimated: 42 mL/min — ABNORMAL LOW (ref >60)
Glucose, Bld: 140 mg/dL — ABNORMAL HIGH (ref 70–99)
Potassium: 3.6 mmol/L (ref 3.5–5.1)
Sodium: 140 mmol/L (ref 135–145)

## 2022-04-10 LAB — HIV ANTIBODY (ROUTINE TESTING W REFLEX): HIV Screen 4th Generation wRfx: NONREACTIVE

## 2022-04-10 LAB — GLUCOSE, CAPILLARY
Glucose-Capillary: 160 mg/dL — ABNORMAL HIGH (ref 70–99)
Glucose-Capillary: 160 mg/dL — ABNORMAL HIGH (ref 70–99)
Glucose-Capillary: 185 mg/dL — ABNORMAL HIGH (ref 70–99)
Glucose-Capillary: 137 mg/dL — ABNORMAL HIGH (ref 70–99)

## 2022-04-10 LAB — ALBUMIN: Albumin: 2.1 g/dL — ABNORMAL LOW (ref 3.5–5.0)

## 2022-04-10 NOTE — Progress Notes (Signed)
Urology Inpatient Progress Report  UTI (urinary tract infection) [N39.0]  Procedure(s): CYSTOSCOPY WITH RETROGRADE PYELOGRAM AND STENT PLACEMENT  1 Day Post-Op   Intv/Subj: No acute events overnight. Patient is without complaint. Foley out and voiding now.  Urine cultures pending.  Remains on ceftriaxone.  Fattening improved.  Principal Problem:   UTI (urinary tract infection)  Current Facility-Administered Medications  Medication Dose Route Frequency Provider Last Rate Last Admin   0.9 %  sodium chloride infusion   Intravenous Continuous Marton Redwood III, MD 50 mL/hr at 04/09/22 1650 Restarted at 04/09/22 1852   0.9 %  sodium chloride infusion   Intravenous Continuous Marton Redwood III, MD 75 mL/hr at 04/10/22 0729 New Bag at 04/10/22 0729   acetaminophen (TYLENOL) tablet 650 mg  650 mg Oral Q4H PRN Marton Redwood III, MD   650 mg at 04/10/22 1702   atorvastatin (LIPITOR) tablet 10 mg  10 mg Oral QHS Marton Redwood III, MD   10 mg at 04/09/22 2136   cefTRIAXone (ROCEPHIN) 2 g in sodium chloride 0.9 % 100 mL IVPB  2 g Intravenous Q24H Marton Redwood III, MD 200 mL/hr at 04/10/22 1657 2 g at 04/10/22 1657   cholecalciferol (VITAMIN D3) 25 MCG (1000 UNIT) tablet 1,000 Units  1,000 Units Oral Daily Marton Redwood III, MD   1,000 Units at 04/10/22 0940   diphenhydrAMINE (BENADRYL) injection 12.5 mg  12.5 mg Intravenous Q6H PRN Marton Redwood III, MD       Or   diphenhydrAMINE (BENADRYL) 12.5 MG/5ML elixir 12.5 mg  12.5 mg Oral Q6H PRN Marton Redwood III, MD       docusate sodium (COLACE) capsule 100 mg  100 mg Oral BID Marton Redwood III, MD   100 mg at 04/10/22 0940   famotidine (PEPCID) tablet 40 mg  40 mg Oral QHS Marton Redwood III, MD   40 mg at 04/09/22 2136   fluticasone (FLONASE) 50 MCG/ACT nasal spray 2 spray  2 spray Each Nare Daily PRN Marton Redwood III, MD       insulin aspart (novoLOG) injection 0-15 Units  0-15 Units Subcutaneous TID WC Marton Redwood III, MD   3 Units  at 04/10/22 1652   lisinopril (ZESTRIL) tablet 10 mg  10 mg Oral Daily Marton Redwood III, MD   10 mg at 04/10/22 0940   loratadine (CLARITIN) tablet 10 mg  10 mg Oral Daily Marton Redwood III, MD   10 mg at 04/10/22 0940   melatonin tablet 5 mg  5 mg Oral QHS Marton Redwood III, MD   5 mg at 04/09/22 2136   morphine (PF) 2 MG/ML injection 2-4 mg  2-4 mg Intravenous Q2H PRN Marton Redwood III, MD       neomycin-bacitracin-polymyxin [phone] OINT 1 Application  1 Application Topical TID PRN Marton Redwood III, MD       omega-3 acid ethyl esters (LOVAZA) capsule 4 g  4 capsule Oral QHS Marton Redwood III, MD   4 g at 04/09/22 2136   ondansetron (ZOFRAN) injection 4 mg  4 mg Intravenous Q4H PRN Marton Redwood III, MD       oxybutynin (DITROPAN) tablet 5 mg  5 mg Oral Q8H PRN Marton Redwood III, MD       pantoprazole (PROTONIX) EC tablet 40 mg  40 mg Oral Daily Marton Redwood III, MD   40 mg at  04/10/22 0940   Rivaroxaban (XARELTO) tablet 15 mg  15 mg Oral QPC supper Marton Redwood III, MD   15 mg at 04/10/22 1701   senna (SENOKOT) tablet 8.6 mg  1 tablet Oral BID Marton Redwood III, MD   8.6 mg at 04/10/22 0940   traMADol (ULTRAM) tablet 50 mg  50 mg Oral BID PRN Marton Redwood III, MD       traZODone (DESYREL) tablet 100 mg  100 mg Oral QHS Marton Redwood III, MD   100 mg at 04/09/22 2136   zolpidem (AMBIEN) tablet 5 mg  5 mg Oral QHS PRN,MR X 1 Marton Redwood III, MD         Objective: Vital: Vitals:   04/09/22 2355 04/10/22 0355 04/10/22 0656 04/10/22 1404  BP: (!) 101/58 105/61 112/70 111/63  Pulse: 65 65 67 75  Resp: '19 19 18   '$ Temp: 98.5 F (36.9 C) 98.4 F (36.9 C) 98.4 F (36.9 C) 98.4 F (36.9 C)  TempSrc: Oral Oral Oral Oral  SpO2: 94% 94% 97% 95%  Weight:      Height:       I/Os: I/O last 3 completed shifts: In: 1945 [P.O.:600; I.V.:1245; IV Piggyback:100] Out: 750 [Urine:750]  Physical Exam:  General: Patient is in no apparent distress Lungs: Normal respiratory  effort, chest expands symmetrically. GI: The abdomen is soft and nontender without mass. Ext: lower extremities symmetric  Lab Results: Recent Labs    04/09/22 1607 04/10/22 0533  WBC 17.7* 16.5*  HGB 14.6 12.3*  HCT 42.4 36.7*   Recent Labs    04/09/22 1607 04/10/22 0431  NA 138 140  K 4.0 3.6  CL 102 114*  CO2 23 20*  GLUCOSE 150* 140*  BUN 26* 26*  CREATININE 2.32* 1.72*  CALCIUM 8.9 6.2*   No results for input(s): "LABPT", "INR" in the last 72 hours. No results for input(s): "LABURIN" in the last 72 hours. Results for orders placed or performed during the hospital encounter of 01/26/21  Resp Panel by RT-PCR (Flu A&B, Covid) Nasopharyngeal Swab     Status: None   Collection Time: 01/26/21 10:44 PM   Specimen: Nasopharyngeal Swab; Nasopharyngeal(NP) swabs in vial transport medium  Result Value Ref Range Status   SARS Coronavirus 2 by RT PCR NEGATIVE NEGATIVE Final    Comment: (NOTE) SARS-CoV-2 target nucleic acids are NOT DETECTED.  The SARS-CoV-2 RNA is generally detectable in upper respiratory specimens during the acute phase of infection. The lowest concentration of SARS-CoV-2 viral copies this assay can detect is 138 copies/mL. A negative result does not preclude SARS-Cov-2 infection and should not be used as the sole basis for treatment or other patient management decisions. A negative result may occur with  improper specimen collection/handling, submission of specimen other than nasopharyngeal swab, presence of viral mutation(s) within the areas targeted by this assay, and inadequate number of viral copies(<138 copies/mL). A negative result must be combined with clinical observations, patient history, and epidemiological information. The expected result is Negative.  Fact Sheet for Patients:  EntrepreneurPulse.com.au  Fact Sheet for Healthcare Providers:  IncredibleEmployment.be  This test is no t yet approved or  cleared by the [place] FDA and  has been authorized for detection and/or diagnosis of SARS-CoV-2 by FDA under an Emergency Use Authorization (EUA). This EUA will remain  in effect (meaning this test can be used) for the duration of the COVID-19 declaration under Section 564(b)(1) of the Act, 21 U.S.C.section  360bbb-3(b)(1), unless the authorization is terminated  or revoked sooner.       Influenza A by PCR NEGATIVE NEGATIVE Final   Influenza B by PCR NEGATIVE NEGATIVE Final    Comment: (NOTE) The Xpert Xpress SARS-CoV-2/FLU/RSV plus assay is intended as an aid in the diagnosis of influenza from Nasopharyngeal swab specimens and should not be used as a sole basis for treatment. Nasal washings and aspirates are unacceptable for Xpert Xpress SARS-CoV-2/FLU/RSV testing.  Fact Sheet for Patients: EntrepreneurPulse.com.au  Fact Sheet for Healthcare Providers: IncredibleEmployment.be  This test is not yet approved or cleared by the [place] FDA and has been authorized for detection and/or diagnosis of SARS-CoV-2 by FDA under an Emergency Use Authorization (EUA). This EUA will remain in effect (meaning this test can be used) for the duration of the COVID-19 declaration under Section 564(b)(1) of the Act, 21 U.S.C. section 360bbb-3(b)(1), unless the authorization is terminated or revoked.  Performed at [facility], [place] [address], [place], [place] [address]     Studies/Results: DG C-Arm 1-60 Min-No Report  Result Date: 04/09/2022 Fluoroscopy was utilized by the requesting physician.  No radiographic interpretation.    Assessment: Left ureteral calculus Urinary tract infection  Procedure(s): CYSTOSCOPY WITH RETROGRADE PYELOGRAM AND STENT PLACEMENT, 1 Day Post-Op  doing well.  Plan: Continue ceftriaxone.  Stop IV fluids.  He will be stable for discharge once we have sensitivities back and are able to send him home safely  with a p.o. antibiotic.   Link Snuffer, MD Urology 04/10/2022, 6:10 PM

## 2022-04-10 NOTE — Progress Notes (Signed)
Date and time results received: 04/10/22 0557 am (use smartphrase ".now" to insert current time)  Test: BMP Critical Value: Calcium 6.2  Name of Provider Notified: Nonie Hoyer (on call provider)  Orders Received? Or Actions Taken?: Provider aware with no new orders for now.

## 2022-04-11 LAB — BASIC METABOLIC PANEL
Anion gap: 10 (ref 5–15)
BUN: 35 mg/dL — ABNORMAL HIGH (ref 8–23)
CO2: 23 mmol/L (ref 22–32)
Calcium: 8.6 mg/dL — ABNORMAL LOW (ref 8.9–10.3)
Chloride: 104 mmol/L (ref 98–111)
Creatinine, Ser: 1.96 mg/dL — ABNORMAL HIGH (ref 0.61–1.24)
GFR, Estimated: 36 mL/min — ABNORMAL LOW (ref >60)
Glucose, Bld: 185 mg/dL — ABNORMAL HIGH (ref 70–99)
Potassium: 3.8 mmol/L (ref 3.5–5.1)
Sodium: 137 mmol/L (ref 135–145)

## 2022-04-11 LAB — GLUCOSE, CAPILLARY
Glucose-Capillary: 162 mg/dL — ABNORMAL HIGH (ref 70–99)
Glucose-Capillary: 141 mg/dL — ABNORMAL HIGH (ref 70–99)
Glucose-Capillary: 146 mg/dL — ABNORMAL HIGH (ref 70–99)
Glucose-Capillary: 132 mg/dL — ABNORMAL HIGH (ref 70–99)

## 2022-04-11 NOTE — Progress Notes (Signed)
Mobility Specialist - Progress Note   04/11/22 1426  Mobility  Activity Ambulated with assistance in hallway  Level of Assistance Independent after set-up  Assistive Device Cane  Distance Ambulated (ft) 500 ft  Activity Response Tolerated well  Mobility Referral Yes  $Mobility charge 1 Mobility   Pt received in bed and agreeable to mobility. No complaints during mobility. Pt to bed after session with all needs met & wife in room.    [facility]

## 2022-04-11 NOTE — Progress Notes (Signed)
  Transition of Care [facility]) Screening Note   Patient Details  Name: Joseph Pham Date of Birth: [birth year]   Transition of [facility]) CM/SW Contact:    Vassie Moselle, LCSW Phone Number: 04/11/2022, 9:05 AM    Transition of Care Department [facility]) has reviewed patient and no TOC needs have been identified at this time. We will continue to monitor patient advancement through interdisciplinary progression rounds. If new patient transition needs arise, please place a TOC consult.

## 2022-04-11 NOTE — Progress Notes (Signed)
Urology Inpatient Progress Report  UTI (urinary tract infection) [N39.0]  Procedure(s): CYSTOSCOPY WITH RETROGRADE PYELOGRAM AND STENT PLACEMENT  2 Days Post-Op   Intv/Subj: No acute events overnight. Patient is without complaint. Ucx + for klebsiella, sensitivities pending  Principal Problem:   UTI (urinary tract infection)  Current Facility-Administered Medications  Medication Dose Route Frequency Provider Last Rate Last Admin   acetaminophen (TYLENOL) tablet 650 mg  650 mg Oral Q4H PRN Marton Redwood III, MD   650 mg at 04/11/22 1413   atorvastatin (LIPITOR) tablet 10 mg  10 mg Oral QHS Marton Redwood III, MD   10 mg at 04/10/22 2113   cefTRIAXone (ROCEPHIN) 2 g in sodium chloride 0.9 % 100 mL IVPB  2 g Intravenous Q24H Marton Redwood III, MD 200 mL/hr at 04/11/22 1720 2 g at 04/11/22 1720   cholecalciferol (VITAMIN D3) 25 MCG (1000 UNIT) tablet 1,000 Units  1,000 Units Oral Daily Marton Redwood III, MD   1,000 Units at 04/11/22 0920   diphenhydrAMINE (BENADRYL) injection 12.5 mg  12.5 mg Intravenous Q6H PRN Marton Redwood III, MD       Or   diphenhydrAMINE (BENADRYL) 12.5 MG/5ML elixir 12.5 mg  12.5 mg Oral Q6H PRN Marton Redwood III, MD       docusate sodium (COLACE) capsule 100 mg  100 mg Oral BID Marton Redwood III, MD   100 mg at 04/11/22 0915   famotidine (PEPCID) tablet 40 mg  40 mg Oral QHS Marton Redwood III, MD   40 mg at 04/10/22 2112   fluticasone (FLONASE) 50 MCG/ACT nasal spray 2 spray  2 spray Each Nare Daily PRN Marton Redwood III, MD       insulin aspart (novoLOG) injection 0-15 Units  0-15 Units Subcutaneous TID WC Marton Redwood III, MD   2 Units at 04/11/22 1719   lisinopril (ZESTRIL) tablet 10 mg  10 mg Oral Daily Marton Redwood III, MD   10 mg at 04/11/22 0915   loratadine (CLARITIN) tablet 10 mg  10 mg Oral Daily Marton Redwood III, MD   10 mg at 04/11/22 0915   melatonin tablet 5 mg  5 mg Oral QHS Marton Redwood III, MD   5 mg at 04/10/22 2112   morphine  (PF) 2 MG/ML injection 2-4 mg  2-4 mg Intravenous Q2H PRN Marton Redwood III, MD       neomycin-bacitracin-polymyxin [phone] OINT 1 Application  1 Application Topical TID PRN Marton Redwood III, MD       omega-3 acid ethyl esters (LOVAZA) capsule 4 g  4 capsule Oral QHS Marton Redwood III, MD   4 g at 04/10/22 2310   ondansetron (ZOFRAN) injection 4 mg  4 mg Intravenous Q4H PRN Marton Redwood III, MD       oxybutynin (DITROPAN) tablet 5 mg  5 mg Oral Q8H PRN Marton Redwood III, MD       pantoprazole (PROTONIX) EC tablet 40 mg  40 mg Oral Daily Marton Redwood III, MD   40 mg at 04/11/22 0915   Rivaroxaban (XARELTO) tablet 15 mg  15 mg Oral QPC supper Marton Redwood III, MD   15 mg at 04/11/22 1718   senna (SENOKOT) tablet 8.6 mg  1 tablet Oral BID Marton Redwood III, MD   8.6 mg at 04/11/22 0915   traMADol (ULTRAM) tablet 50 mg  50 mg Oral BID  PRN Lucas Mallow, MD       traZODone (DESYREL) tablet 100 mg  100 mg Oral QHS Marton Redwood III, MD   100 mg at 04/10/22 2113   zolpidem (AMBIEN) tablet 5 mg  5 mg Oral QHS PRN,MR X 1 Marton Redwood III, MD         Objective: Vital: Vitals:   04/10/22 1404 04/10/22 2148 04/11/22 0530 04/11/22 1405  BP: 111/63 110/75 (!) 143/71 126/78  Pulse: 75 72 78 74  Resp:  18 18   Temp: 98.4 F (36.9 C) 98.4 F (36.9 C) 97.6 F (36.4 C) 99.1 F (37.3 C)  TempSrc: Oral Oral Oral Oral  SpO2: 95% 95% 96% 96%  Weight:      Height:       I/Os: I/O last 3 completed shifts: In: 2588.7 [P.O.:1620; I.V.:868.7; IV Piggyback:100] Out: 210 [Urine:210]  Physical Exam:  General: Patient is in no apparent distress Lungs: Normal respiratory effort, chest expands symmetrically. GI:  The abdomen is soft and nontender without mass. Ext: lower extremities symmetric  Lab Results: Recent Labs    04/09/22 1607 04/10/22 0533  WBC 17.7* 16.5*  HGB 14.6 12.3*  HCT 42.4 36.7*   Recent Labs    04/09/22 1607 04/10/22 0431  NA 138 140  K 4.0 3.6  CL  102 114*  CO2 23 20*  GLUCOSE 150* 140*  BUN 26* 26*  CREATININE 2.32* 1.72*  CALCIUM 8.9 6.2*   No results for input(s): "LABPT", "INR" in the last 72 hours. No results for input(s): "LABURIN" in the last 72 hours. Results for orders placed or performed during the hospital encounter of 04/09/22  Urine Culture     Status: Abnormal (Preliminary result)   Collection Time: 04/09/22  7:47 PM   Specimen: Urine, Random  Result Value Ref Range Status   Specimen Description   Final    URINE, RANDOM LEFT URETER CYSTOSCOPE Performed at [facility] [address], [place], [place] [address]    Special Requests   Final    NONE Performed at [facility], [place] [address] [place], [place] [address]    Culture (A)  Final    >=100,000 COLONIES/mL KLEBSIELLA PNEUMONIAE SUSCEPTIBILITIES TO FOLLOW Performed at [facility], [place] [address], [place], [place] [address]    Report Status PENDING  Incomplete    Studies/Results: No results found.  Assessment: Left ureteral stone UTI  Procedure(s): CYSTOSCOPY WITH RETROGRADE PYELOGRAM AND STENT PLACEMENT, 2 Days Post-Op  doing well.  Plan: Ucx sensitivites pending. Will dc once those return and can send home on PO abx   Link Snuffer, MD Urology 04/11/2022, 7:47 PM

## 2022-04-11 NOTE — Progress Notes (Signed)
Mobility Specialist - Progress Note   04/11/22 1032  Mobility  Activity Ambulated independently in hallway  Level of Assistance Independent  Assistive Device None  Distance Ambulated (ft) 500 ft  Range of Motion/Exercises Active  Activity Response Tolerated well  Mobility Referral Yes  $Mobility charge 1 Mobility   Pt received in room standing and agreeable to mobility. No complaints during mobility. Pt to room after session with all needs met & family in room.    [facility]

## 2022-04-12 LAB — GLUCOSE, CAPILLARY
Glucose-Capillary: 178 mg/dL — ABNORMAL HIGH (ref 70–99)
Glucose-Capillary: 151 mg/dL — ABNORMAL HIGH (ref 70–99)

## 2022-04-12 LAB — URINE CULTURE: Culture: >=100000 — AB

## 2022-04-12 MED ORDER — CEPHALEXIN 500 MG PO CAPS: 500.0000 mg | ORAL_CAPSULE | Freq: Four times a day (QID) | ORAL | 0 refills | Status: AC

## 2022-04-12 NOTE — Discharge Summary (Signed)
Physician Discharge Summary  Patient ID: Joseph Pham MRN: [id number] DOB/AGE: September 12, 1950 71 y.o.  Admit date: 04/09/2022 Discharge date: 04/12/2022  Admission Diagnoses:  Discharge Diagnoses:  Principal Problem:   UTI (urinary tract infection)   Discharged Condition: good  Hospital Course: Patient underwent cystoscopy with left retrograde pyelogram, left ureteral stent placement on 04/09/2022.  He remained on ceftriaxone until day of discharge at which point his cultures returned with Klebsiella sensitive except for ampicillin.  He was discharged home in stable condition with Keflex.  Consults: None  Significant Diagnostic Studies: microbiology: urine culture: positive for Klebsiella sensitive except for ampicillin  Treatments: surgery: As above  Discharge Exam: Blood pressure 119/67, pulse 62, temperature 98.5 F (36.9 C), temperature source Oral, resp. rate 18, height '5\' 6"'$  (1.676 m), weight 115.2 kg, SpO2 96 %. General appearance: alert no acute distress Adequate perfusion of extremities Nonlabored respiration Abdomen soft nontender nondistended  Disposition: Discharge disposition: 01-Home or Self Care        Allergies as of 04/12/2022       Reactions   Aspirin Anaphylaxis   Amoxicillin Itching        Medication List     TAKE these medications    atorvastatin 10 MG tablet Commonly known as: LIPITOR Take 10 mg by mouth at bedtime.   brimonidine 0.2 % ophthalmic solution Commonly known as: ALPHAGAN Place 1 drop into the right eye See admin instructions. INSTILL 1 DROP IN RIGHT EYE TWICE DAILY FOR 5 DAYS THEN STOP   cephALEXin 500 MG capsule Commonly known as: KEFLEX Take 1 capsule (500 mg total) by mouth 4 (four) times daily for 7 days.   cholecalciferol 25 MCG (1000 UNIT) tablet Commonly known as: VITAMIN D3 Take 1,000 Units by mouth daily.   famotidine 40 MG tablet Commonly known as: PEPCID Take 40 mg by mouth at bedtime.    fexofenadine 180 MG tablet Commonly known as: ALLEGRA Take 180 mg by mouth daily.   fluticasone 50 MCG/ACT nasal spray Commonly known as: FLONASE Place 2 sprays into the nose daily as needed for allergies.   lisinopril 10 MG tablet Commonly known as: ZESTRIL Take 10 mg by mouth daily.   Melatonin 5 MG Caps Take 5 mg by mouth at bedtime.   metFORMIN 500 MG tablet Commonly known as: GLUCOPHAGE Take 500 mg by mouth at bedtime.   ofloxacin 0.3 % ophthalmic solution Commonly known as: OCUFLOX   omega-3 acid ethyl esters 1 g capsule Commonly known as: LOVAZA Take 4 capsules by mouth at bedtime.   omeprazole 20 MG capsule Commonly known as: PRILOSEC Take 20 mg by mouth daily.   prednisoLONE acetate 1 % ophthalmic suspension Commonly known as: PRED FORTE   Rivaroxaban 15 MG Tabs tablet Commonly known as: XARELTO Take 1 tablet (15 mg total) by mouth daily.   traMADol 50 MG tablet Commonly known as: ULTRAM Take 50 mg by mouth 2 (two) times daily as needed for moderate pain or severe pain.   traZODone 100 MG tablet Commonly known as: DESYREL Take 100 mg at bedtime by mouth.   zolpidem 12.5 MG CR tablet Commonly known as: Ambien CR Take 1 tablet (12.5 mg total) by mouth at bedtime as needed for sleep.         Signed: Marton Redwood, III 04/12/2022, 12:35 PM

## 2022-04-14 ENCOUNTER — Ambulatory Visit: Payer: Medicare Other | Admitting: Cardiology

## 2022-04-16 ENCOUNTER — Other Ambulatory Visit: Payer: Self-pay | Admitting: Urology

## 2022-04-16 ENCOUNTER — Other Ambulatory Visit: Payer: Self-pay

## 2022-04-16 DIAGNOSIS — T8859XA Other complications of anesthesia, initial encounter: Secondary | ICD-10-CM | POA: Insufficient documentation

## 2022-04-16 DIAGNOSIS — I714 Abdominal aortic aneurysm, without rupture, unspecified: Secondary | ICD-10-CM

## 2022-04-16 HISTORY — DX: Abdominal aortic aneurysm, without rupture, unspecified: I71.40

## 2022-04-16 NOTE — Progress Notes (Unsigned)
Cardiology Office Note:    Date:  04/17/2022   ID:  Joseph Pham, DOB 04/12/1951, MRN [id number]  PCP:  Angelina Sheriff, MD  Cardiologist:  Shirlee More, MD    Referring MD: Angelina Sheriff, MD    ASSESSMENT:    1. Chronic anticoagulation   2. Essential hypertension   3. Ascending aorta enlargement (HCC)   4. Nonrheumatic aortic valve insufficiency    PLAN:    In order of problems listed above:  See below he can withdraw his anticoagulant 3 days before usually restart 1 to 2 days after elective urologic surgery Stable at target continue treatment ACE inhibitor Electively recheck his thoracic aorta no need to delay for his planned urologic surgery he does not have aneurysm Mild no murmur on exam Continue his statin   Next appointment: 1 year   Medication Adjustments/Labs and Tests Ordered: Current medicines are reviewed at length with the patient today.  Concerns regarding medicines are outlined above.  No orders of the defined types were placed in this encounter.  No orders of the defined types were placed in this encounter.   Chief Complaint  Patient presents with   Follow-up   Hypertension   Anticoagulation    History of Present Illness:    Joseph Pham is a 71 y.o. male with a hx of hypertensive heart and kidney disease enlargement ascending aorta aortic regurgitation and looked pulmonary embolism with chronic anticoagulation last seen 04/15/2021.  He was discharged from [facility] 02/04/2019 after presenting to the emergency room with increasing shortness of breath and was found to have bilateral pulmonary embolism.  He was admitted to the hospital initially treated with heparin and transitioned to Xarelto.  The venous thromboembolism was felt to be unprovoked and recommended initially for 6 months of anticoagulation and consideration of long-term treatment.  In hospital he had an echocardiogram performed showing normal right  ventricular size and function left ventricular ejection fraction was 60 to 65%.  He also had mild aortic regurgitation.  CTA showed unchanged in the aneurysmal dilation of ascending aorta 42 mm and the findings of filling defect in the segmental pulmonary arteries of the right upper lobe right lower lobe without evidence of right heart strain.  He also has mild ostial narrowing of the left renal artery.  Noncontrast CTA 2019 November maximum diameter 43 mm.    Compliance with diet, lifestyle and medications: Yes  He is having urologic surgery he thinks cystoscopic Tuesday for stone removal advised to stop his anticoagulant 3 days before surgery I agree and I told him generally started 24 to 48 hours and receive instructions by surgery And does not expect to be admitted to the hospital Not having palpitation edema shortness of breath chest pain palpitation or syncope Despite a stone he has had no hematuria His EKG in the office today shows poor R wave progression nonspecific T waves In my opinion he can safely withdraw his anticoagulant for this elective surgery as outlined and is optimized from a cardiology perspective Will ask him in a month from now to have a noncontrast CT to follow his thoracic aorta mildly to moderately enlarged but he had a CT of the chest done a year ago with no mention of enlargement Past Medical History:  Diagnosis Date   AAA (abdominal aortic aneurysm) ([place]) 04/16/2022   Abnormal EKG 10/14/2015   Overview:  Poor R wave progression and abnormal T waves   Acid reflux 03/16/2017  Adenocarcinoma of prostate ([place]) 07/10/2011   Aftercare following surgery 08/30/2015   Allergy    Arthritis HANDS AND LEFT KNEE   Ascending aorta enlargement ([place]) 08/19/2016   Overview:  43 mm ascending, 11/22/15   Bilateral hearing loss 07/19/2019   Cataract    forming bilaterally    Chest pain    Chest pain in adult 04/13/2018   Chronic tonsillitis 07/19/2019   CKD (chronic kidney  disease), stage II 40/98/1191   Complication of anesthesia    low heart rate during lithotripsy procedure   Deviated septum 07/19/2019   Diabetes ([place]) 03/16/2017   Essential hypertension 10/14/2015   GERD (gastroesophageal reflux disease)    Hematuria    Hemorrhoid    History of kidney stones    Hyperlipidemia    Hypertension    Impingement of left ankle joint 03/06/2021   Kidney stone 03/16/2017   Lumbar radiculopathy 03/16/2017   Nasal turbinate hypertrophy 07/19/2019   OSA (obstructive sleep apnea)    OSA on CPAP    Paronychia 08/16/2015   PE (pulmonary thromboembolism) ([place]) 02/2019   Posterior tibialis tendinitis of both lower extremities 02/02/2020   Preoperative cardiovascular examination 08/19/2016   Pronation deformity of both feet 02/02/2020   Prostate cancer ([place]) 03/16/2017   Right flank pain    Right ureteral stone    Skin cancer    Sleep apnea    Tibialis posterior tendinitis 02/11/2021   Tinnitus, bilateral 08/31/2019   Type 2 diabetes, diet controlled ([place])    off Metformin > 1 yr now as of PV 02-23-18   Ureteral calculus, right 03/15/2012   UTI (urinary tract infection) 04/09/2022   Wears glasses     Past Surgical History:  Procedure Laterality Date   BACK SURGERY  2019   rod and screws    BILATERAL INGUINAL HERNIA REPAIR  1989   CARDIAC CATHETERIZATION  01-11-2004   NO EVIDENCE SIGNIFICANT EPICARDIAL FLOW LIMITING 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   CARPAL TUNNEL RELEASE Left 06/2016   CARPAL TUNNEL RELEASE Right 03/2017   COLONOSCOPY     last with medoff unsure when    [employer], URETEROSCOPY AND STENT PLACEMENT Left 04/09/2022   Procedure: CYSTOSCOPY WITH RETROGRADE PYELOGRAM AND STENT PLACEMENT;  Surgeon: Lucas Mallow, MD;  Location: [facility];  Service: Urology;  Laterality: Left;   CYSTOSCOPY WITH URETEROSCOPY AND STENT PLACEMENT Right 10/28/2013   Procedure: RIGHT URETEROSCOPY AND STENT  PLACEMENT;  Surgeon: [name], MD;  Location: [facility];  Service: Urology;  Laterality: Right;   CYSTOSCOPY/URETEROSCOPY/HOLMIUM LASER/STENT PLACEMENT Left 09/06/2020   Procedure: CYSTOSCOPY/POSSIBLE URETEROSCOPY/HOLMIUM LASER/STENT PLACEMENT;  Surgeon: Remi Haggard, MD;  Location: [facility];  Service: Urology;  Laterality: Left;  30 MINS   EXTRACORPOREAL SHOCK WAVE LITHOTRIPSY  02-08-2009   HOLMIUM LASER APPLICATION Right 4/78/2956   Procedure: HOLMIUM LASER LITHO;  Surgeon: [name], MD;  Location: [facility];  Service: Urology;  Laterality: Right;   LEFT KNEE [place]   LEFT URETEROSCOPIC STONE EXTRACTION  02-23-2009   POLYPECTOMY  2014   1 polyp per dr Steve Rattler notes- no surg path to determine polyps type but was a 5 yr recall    RADIOACTIVE SEED IMPLANT  08/08/2011   Procedure: RADIOACTIVE SEED IMPLANT;  Surgeon: [name], MD;  Location: [facility];  Service: Urology;  Laterality: N/A;  seeds implanted 50 seeds found  in bladder=none    REPLACEMENT TOTAL KNEE Left 2018   SKIN CANCER EXCISION     Burned off left side of nose, and head   TOTAL KNEE ARTHROPLASTY Right 08/2018   UPPER GASTROINTESTINAL ENDOSCOPY     URETEROSCOPY  03/15/2012   Procedure: URETEROSCOPY;  Surgeon: [name], MD;  Location: [facility];  Service: Urology;  Laterality: Right;  Right Ueteroscopy     Current Medications: Current Meds  Medication Sig   atorvastatin (LIPITOR) 10 MG tablet Take 10 mg by mouth at bedtime.   Carboxymethylcellul-Glycerin (REFRESH OPTIVE PF OP) Place 1 drop into both eyes 2 (two) times daily as needed (dry eyes).   cephALEXin (KEFLEX) 500 MG capsule Take 1 capsule (500 mg total) by mouth 4 (four) times daily for 7 days.   cholecalciferol (VITAMIN D3) 25 MCG (1000 UT) tablet Take 1,000 Units by mouth daily.   famotidine (PEPCID) 40 MG tablet Take 40 mg by mouth at bedtime.     fexofenadine (ALLEGRA) 180 MG tablet Take 180 mg by mouth daily.   fluticasone (FLONASE) 50 MCG/ACT nasal spray Place 2 sprays into the nose daily as needed for allergies.    lisinopril (PRINIVIL,ZESTRIL) 10 MG tablet Take 10 mg by mouth daily.    Melatonin 5 MG CAPS Take 5 mg by mouth at bedtime.   metFORMIN (GLUCOPHAGE) 500 MG tablet Take 500 mg by mouth at bedtime.   omega-3 acid ethyl esters (LOVAZA) 1 g capsule Take 4 g by mouth at bedtime.   omeprazole (PRILOSEC) 20 MG capsule Take 20 mg by mouth daily.   Rivaroxaban (XARELTO) 15 MG TABS tablet Take 1 tablet (15 mg total) by mouth daily.   traMADol (ULTRAM) 50 MG tablet Take 50 mg by mouth 4 (four) times daily.   traZODone (DESYREL) 100 MG tablet Take 50 mg by mouth at bedtime.   zolpidem (AMBIEN CR) 12.5 MG CR tablet Take 1 tablet (12.5 mg total) by mouth at bedtime as needed for sleep.     Allergies:   Aspirin and Amoxicillin   Social History   Socioeconomic History   Marital status: Married    Spouse name: Not on file   Number of children: Not on file   Years of education: Not on file   Highest education level: Not on file  Occupational History   Not on file  Tobacco Use   Smoking status: Former    Years: 10.00    Types: Cigarettes    Quit date: 07/02/1993    Years since quitting: 28.8   Smokeless tobacco: Former    Types: Nurse, children's Use: Never used  Substance and Sexual Activity   Alcohol use: No   Drug use: No   Sexual activity: Not on file  Other Topics Concern   Not on file  Social History Narrative   Not on file   Social Determinants of Health   Financial Resource Strain: Not on file  Food Insecurity: No Food Insecurity (04/11/2022)   Hunger Vital Sign    Worried About Running Out of Food in the Last Year: Never true    Ran Out of Food in the Last Year: Never true  Transportation Needs: No Transportation Needs (04/11/2022)   PRAPARE - Hydrologist (Medical):  No    Lack of Transportation (Non-Medical): No  Physical Activity: Not on file  Stress: Not on file  Social Connections: Not on file  Family History: The patient's family history includes CAD in his father; Cancer in his mother; Colon polyps in his brother and sister; Diabetes in his father and sister; Heart attack in his father. There is no history of Colon cancer, Esophageal cancer, Rectal cancer, or Stomach cancer. ROS:   Please see the history of present illness.    All other systems reviewed and are negative.  EKGs/Labs/Other Studies Reviewed:    The following studies were reviewed today:  EKG:  EKG ordered today and personally reviewed.  The ekg ordered today demonstrates sinus rhythm poor R wave progression nonspecific T waves  Recent Labs: 04/09/2022: ALT 24 04/10/2022: Hemoglobin 12.3; Platelets 139 04/11/2022: BUN 35; Creatinine, Ser 1.96; Potassium 3.8; Sodium 137  Recent Lipid Panel    Component Value Date/Time   CHOL 153 02/03/2019 0130   TRIG 235 (H) 02/03/2019 0130   HDL 35 (L) 02/03/2019 0130   CHOLHDL 4.4 02/03/2019 0130   VLDL 47 (H) 02/03/2019 0130   LDLCALC 71 02/03/2019 0130    Physical Exam:    VS:  BP 120/70   Pulse 74   Ht '5\' 6"'$  (1.676 m)   Wt 251 lb 9.6 oz (114.1 kg)   SpO2 97%   BMI 40.61 kg/m     Wt Readings from Last 3 Encounters:  04/17/22 251 lb 9.6 oz (114.1 kg)  04/09/22 254 lb (115.2 kg)  02/27/22 225 lb (102.1 kg)     GEN: Obese BMI exceeds 40 well nourished, well developed in no acute distress HEENT: Normal NECK: No JVD; No carotid bruits LYMPHATICS: No lymphadenopathy CARDIAC: RRR, no murmurs, rubs, gallops RESPIRATORY:  Clear to auscultation without rales, wheezing or rhonchi  ABDOMEN: Soft, non-tender, non-distended MUSCULOSKELETAL:  No edema; No deformity  SKIN: Warm and dry NEUROLOGIC:  Alert and oriented x 3 PSYCHIATRIC:  Normal affect    Signed, Shirlee More, MD  04/17/2022 10:41 AM    [place]

## 2022-04-17 ENCOUNTER — Ambulatory Visit: Payer: Medicare Other | Attending: Cardiology | Admitting: Cardiology

## 2022-04-17 ENCOUNTER — Encounter: Payer: Self-pay | Admitting: Cardiology

## 2022-04-17 ENCOUNTER — Telehealth: Payer: Self-pay | Admitting: Cardiology

## 2022-04-17 VITALS — BP 120/70 | HR 74 | Ht 66.0 in | Wt 251.6 lb

## 2022-04-17 DIAGNOSIS — I351 Nonrheumatic aortic (valve) insufficiency: Secondary | ICD-10-CM | POA: Diagnosis not present

## 2022-04-17 DIAGNOSIS — Z01810 Encounter for preprocedural cardiovascular examination: Secondary | ICD-10-CM | POA: Insufficient documentation

## 2022-04-17 DIAGNOSIS — Z7901 Long term (current) use of anticoagulants: Secondary | ICD-10-CM | POA: Diagnosis not present

## 2022-04-17 DIAGNOSIS — I10 Essential (primary) hypertension: Secondary | ICD-10-CM | POA: Insufficient documentation

## 2022-04-17 DIAGNOSIS — I7789 Other specified disorders of arteries and arterioles: Secondary | ICD-10-CM | POA: Insufficient documentation

## 2022-04-17 NOTE — Patient Instructions (Signed)
Medication Instructions:  Your physician recommends that you continue on your current medications as directed. Please refer to the Current Medication list given to you today.  *If you need a refill on your cardiac medications before your next appointment, please call your pharmacy*   Lab Work: None If you have labs (blood work) drawn today and your tests are completely normal, you will receive your results only by: [place] (if you have MyChart) OR A paper copy in the mail If you have any lab test that is abnormal or we need to change your treatment, we will call you to review the results.   Testing/Procedures: Non-Cardiac CT scanning, (CAT scanning), is a noninvasive, special x-ray that produces cross-sectional images of the body using x-rays and a computer. CT scans help physicians diagnose and treat medical conditions. For some CT exams, a contrast material is used to enhance visibility in the area of the body being studied. CT scans provide greater clarity and reveal more details than regular x-ray exams.    Follow-Up: At [facility], you and your health needs are our priority.  As part of our continuing mission to provide you with exceptional heart care, we have created designated Provider Care Teams.  These Care Teams include your primary Cardiologist (physician) and Advanced Practice Providers (APPs -  Physician Assistants and Nurse Practitioners) who all work together to provide you with the care you need, when you need it.  We recommend signing up for the patient portal called "MyChart".  Sign up information is provided on this After Visit Summary.  MyChart is used to connect with patients for Virtual Visits (Telemedicine).  Patients are able to view lab/test results, encounter notes, upcoming appointments, etc.  Non-urgent messages can be sent to your provider as well.   To learn more about what you can do with MyChart, go to [email or web address].    Your next  appointment:   1 year(s)  The format for your next appointment:   In Person  Provider:   Shirlee More, MD    Other Instructions None  Important Information About Sugar

## 2022-04-17 NOTE — Addendum Note (Signed)
Addended by: Edwyna Shell I on: 04/17/2022 11:24 AM   Modules accepted: Orders

## 2022-04-17 NOTE — Patient Instructions (Signed)
DUE TO COVID-19 ONLY TWO VISITORS  (aged 71 and older)  ARE ALLOWED TO COME WITH YOU AND STAY IN THE WAITING ROOM ONLY DURING PRE OP AND PROCEDURE.   **NO VISITORS ARE ALLOWED IN THE SHORT STAY AREA OR RECOVERY ROOM!!**  IF YOU WILL BE ADMITTED INTO THE HOSPITAL YOU ARE ALLOWED ONLY FOUR SUPPORT PEOPLE DURING VISITATION HOURS ONLY (7 AM -8PM)   The support person(s) must pass our screening, gel in and out, and wear a mask at all times, including in the patient's room. Patients must also wear a mask when staff or their support person are in the room. Visitors GUEST BADGE MUST BE WORN VISIBLY  One adult visitor may remain with you overnight and MUST be in the room by 8 P.M.     Your procedure is scheduled on: 04/22/22   Report to [facility] Main Entrance    Report to admitting at : 8:15 AM   Call this number if you have problems the morning of surgery [phone]   Do not eat food :After Midnight.   After Midnight you may have the following liquids until: 7:30 AM DAY OF SURGERY  Water Black Coffee (sugar ok, NO MILK/CREAM OR CREAMERS)  Tea (sugar ok, NO MILK/CREAM OR CREAMERS) regular and decaf                             Plain Jell-O (NO RED)                                           Fruit ices (not with fruit pulp, NO RED)                                     Popsicles (NO RED)                                                                  Juice: apple, WHITE grape, WHITE cranberry Sports drinks like Gatorade (NO RED)    Oral Hygiene is also important to reduce your risk of infection.                                    Remember - BRUSH YOUR TEETH THE MORNING OF SURGERY WITH YOUR REGULAR TOOTHPASTE   Do NOT smoke after Midnight   Take these medicines the morning of surgery with A SIP OF WATER: ALLEGRA,OMEPRAZOLE.uSE EYE DROPS AS USUAL.  DO NOT TAKE ANY ORAL DIABETIC MEDICATIONS DAY OF YOUR SURGERY                              You may not have any metal on your body  including hair pins, jewelry, and body piercing             Do not wear  lotions, powders, perfumes/cologne, or deodorant              Men  may shave face and neck.   Do not bring valuables to the hospital. [place].   Contacts, dentures or bridgework may not be worn into surgery.   Bring small overnight bag day of surgery.   DO NOT [place]. PHARMACY WILL DISPENSE MEDICATIONS LISTED ON YOUR MEDICATION LIST TO YOU DURING YOUR ADMISSION [place]!    Patients discharged on the day of surgery will not be allowed to drive home.  Someone NEEDS to stay with you for the first 24 hours after anesthesia.   Special Instructions: Bring a copy of your healthcare power of attorney and living will documents         the day of surgery if you haven't scanned them before.              Please read over the following fact sheets you were given: IF YOU HAVE QUESTIONS ABOUT YOUR PRE-OP INSTRUCTIONS PLEASE CALL [phone]    [facility] - Preparing for Surgery Before surgery, you can play an important role.  Because skin is not sterile, your skin needs to be as free of germs as possible.  You can reduce the number of germs on your skin by washing with CHG (chlorahexidine gluconate) soap before surgery.  CHG is an antiseptic cleaner which kills germs and bonds with the skin to continue killing germs even after washing. Please DO NOT use if you have an allergy to CHG or antibacterial soaps.  If your skin becomes reddened/irritated stop using the CHG and inform your nurse when you arrive at Short Stay. Do not shave (including legs and underarms) for at least 48 hours prior to the first CHG shower.  You may shave your face/neck. Please follow these instructions carefully:  1.  Shower with CHG Soap the night before surgery and the  morning of Surgery.  2.  If you choose to wash your hair, wash your hair first as usual with  your  normal  shampoo.  3.  After you shampoo, rinse your hair and body thoroughly to remove the  shampoo.                           4.  Use CHG as you would any other liquid soap.  You can apply chg directly  to the skin and wash                       Gently with a scrungie or clean washcloth.  5.  Apply the CHG Soap to your body ONLY FROM THE NECK DOWN.   Do not use on face/ open                           Wound or open sores. Avoid contact with eyes, ears mouth and genitals (private parts).                       Wash face,  Genitals (private parts) with your normal soap.             6.  Wash thoroughly, paying special attention to the area where your surgery  will be performed.  7.  Thoroughly rinse your body with warm water from the neck down.  8.  DO NOT  shower/wash with your normal soap after using and rinsing off  the CHG Soap.                9.  Pat yourself dry with a clean towel.            10.  Wear clean pajamas.            11.  Place clean sheets on your bed the night of your first shower and do not  sleep with pets. Day of Surgery : Do not apply any lotions/deodorants the morning of surgery.  Please wear clean clothes to the hospital/surgery center.  FAILURE TO FOLLOW THESE INSTRUCTIONS MAY RESULT IN THE CANCELLATION OF YOUR SURGERY PATIENT SIGNATURE_________________________________  NURSE SIGNATURE__________________________________  ________________________________________________________________________

## 2022-04-17 NOTE — Telephone Encounter (Signed)
   Pre-operative Risk Assessment    Patient Name: [name]  DOB: [birth year] MRN: [id number]      Request for Surgical Clearance    Procedure:   Urethersoscopy   Date of Surgery:  Clearance 04/22/22                                 Surgeon:  dr. Harriet Masson Group or Practice Name:  [facility] Phone number:  [phone] Fax number:  [phone]   Type of Clearance Requested:   - Medical  - Pharmacy:  Hold Rivaroxaban (Xarelto) 72 hrs   Type of Anesthesia:  General    Additional requests/questions:      Eston Mould   04/17/2022, 9:39 AM

## 2022-04-17 NOTE — Telephone Encounter (Signed)
   Patient Name: Joseph Pham  DOB: [birth year] MRN: [id number]  Primary Cardiologist: None  Chart reviewed as part of pre-operative protocol coverage.   Patient was cleared for this procedure by Dr. Bettina Gavia at office visit on 04/17/2022. I will route this recommendation as well as Dr. Joya Gaskins note from recent OV to the requesting party via [place] fax function and remove from pre-op pool.  Please call with questions.  Lenna Sciara, NP 04/17/2022, 1:49 PM

## 2022-04-18 ENCOUNTER — Encounter (HOSPITAL_COMMUNITY)
Admission: RE | Admit: 2022-04-18 | Discharge: 2022-04-18 | Disposition: A | Discharge status: Home / Self Care | Payer: Medicare Other | Source: Ambulatory Visit | Attending: Urology | Admitting: Urology

## 2022-04-18 ENCOUNTER — Encounter (HOSPITAL_COMMUNITY): Payer: Self-pay

## 2022-04-18 ENCOUNTER — Other Ambulatory Visit: Payer: Self-pay

## 2022-04-18 VITALS — BP 125/77 | HR 60 | Temp 97.8°F | Ht 66.0 in | Wt 249.0 lb

## 2022-04-18 DIAGNOSIS — E119 Type 2 diabetes mellitus without complications: Secondary | ICD-10-CM

## 2022-04-18 DIAGNOSIS — E1122 Type 2 diabetes mellitus with diabetic chronic kidney disease: Secondary | ICD-10-CM | POA: Insufficient documentation

## 2022-04-18 DIAGNOSIS — N182 Chronic kidney disease, stage 2 (mild): Secondary | ICD-10-CM | POA: Diagnosis not present

## 2022-04-18 DIAGNOSIS — Z01812 Encounter for preprocedural laboratory examination: Secondary | ICD-10-CM | POA: Diagnosis not present

## 2022-04-18 DIAGNOSIS — Z01818 Encounter for other preprocedural examination: Secondary | ICD-10-CM

## 2022-04-18 DIAGNOSIS — G4733 Obstructive sleep apnea (adult) (pediatric): Secondary | ICD-10-CM | POA: Diagnosis not present

## 2022-04-18 DIAGNOSIS — N201 Calculus of ureter: Secondary | ICD-10-CM | POA: Insufficient documentation

## 2022-04-18 DIAGNOSIS — I129 Hypertensive chronic kidney disease with stage 1 through stage 4 chronic kidney disease, or unspecified chronic kidney disease: Secondary | ICD-10-CM | POA: Diagnosis not present

## 2022-04-18 LAB — GLUCOSE, CAPILLARY: Glucose-Capillary: 111 mg/dL — ABNORMAL HIGH (ref 70–99)

## 2022-04-18 LAB — HEMOGLOBIN A1C
Hgb A1c MFr Bld: 6.2 % — ABNORMAL HIGH (ref 4.8–5.6)
Mean Plasma Glucose: 131.24 mg/dL

## 2022-04-18 NOTE — Progress Notes (Signed)
For Short Stay: [place] appointment date:  Bowel Prep reminder:   For Anesthesia: PCP - Dr. Jacqlyn Larsen II Cardiologist - Dr. Shirlee More. LOV: 04/17/22. Clearance: Diona Browner: NP: EPIC: 04/17/22  Chest x-ray -  EKG - 04/17/22: Chart Stress Test -  ECHO - 02/03/19 Cardiac Cath - 01/11/2004 Pacemaker/ICD device last checked: Pacemaker orders received: Device Rep notified:  Spinal Cord Stimulator:  Sleep Study - Yes CPAP - Yes  Fasting Blood Sugar - N/A Checks Blood Sugar __0___ times a day Date and result of last Hgb A1c-  Last dose of GLP1 agonist-  GLP1 instructions:   Last dose of SGLT-2 inhibitors-  SGLT-2 instructions:   Blood Thinner Instructions: Xarelto: will be on hold on : 04/19/22: cardiologist. Aspirin Instructions: Last Dose:  Activity level: Can go up a flight of stairs and activities of daily living without stopping and without chest pain and/or shortness of breath   Able to exercise without chest pain and/or shortness of breath   Unable to go up a flight of stairs without chest pain and/or shortness of breath     Anesthesia review: Hx: HTN,DIA,AAA,CKD II,OSA(CPAP)  Patient denies shortness of breath, fever, cough and chest pain at PAT appointment   Patient verbalized understanding of instructions that were given to them at the PAT appointment. Patient was also instructed that they will need to review over the PAT instructions again at home before surgery.

## 2022-04-21 NOTE — H&P (Signed)
had prostate cancer treated.  HPI: Joseph Pham is a 71 year-old male established patient who is here for prostate cancer which has been treated.   His PSA at his time of diagnosis was 5.53. His cancer was T2a, Gleason score 4+3 = 7 in 1 core, 3+4 = 7 in 4 cores and 3+3 = 6 in 3 cores. His most recent PSA is <0.015. His PSA results have been low since his prostate cancer treatment.   He received radiation therapy for his cancer. He was treated with Brachytherapy for his cancer. His radiation treatment was complete 08/08/2011. He has not undergone Hormonal Therapy for treatment. He does not have urinary incontinence.   He has not seen blood in his stool since the biopsy. He is not having new bone pain. He has not recently had unwanted weight loss. This condition would be considered of mild to moderate severity with no modifying factors or associated signs or symptoms other than as noted above.   Adenocarcinoma of the prostate: His PSA in 10/11 was 5.9. A repeat PSA in 5/12 was noted to be 5.53 with a free to total ratio of 5%. He was also noted on DRE to have induration in the right apex of the prostate. Prostate vol. - 26.6cc.  Path. - adenocarcinoma Gleason score 4+3 = 7 in 1 core, 3+4 = 7 in 4 cores and 3+3 = 6 in 3 cores for a total of 8/12 cores positive.  Clinical stage T2a.  Treatment: I-125 seed implant on 08/08/11.   04/20/18: He reports to me that since I have seen him last he has had 04/26/19: He returns today for annual follow-up of prostate cancer treated with I 125 seeds in 3/13. He has been doing well over the past year from urologic standpoint with no new urologic complaints. In addition he has not had any weight loss or unexplained bone pain. Did have a PE and is currently on Xarelto. His PSA went from undetectable to just slightly detectable last year but I rechecked it and it had returned to undetectable.   -04/25/20-patient with history of adenocarcinoma the prostate status post  interstitial brachytherapy in March 2013. PSAs have been undetectable last PSA was undetectable on 04/18/2020. Here for follow-up. No interim GU issues except that he had an episode of dark urine earlier this morning. Has had no previous episodes of hematuria.  Micro urinalysis today shows greater than 60 RBCs  -05/23/20-patient with history of adenocarcinoma the prostate status post interstitial brachytherapy in March 2013 as above. Has had some recent gross hematuria and has had CT renal scan in the interim on 05/04/2020. This is reviewed and shows bilateral small probable renal cyst but also has significant stone burden in the left kidney with several stones largest being 1.3 cm stone left renal pelvis area. They are nonobstructing. No significant stones noted on the right. Here for cysto to assess bladder. Micro urinalysis today is clear on urine spun sediment.  Cysto performed today and shows: Normal pendulous prostatic urethral area. Bladder was inspected show no mucosal lesions.  -09/03/20-patient with history of adenocarcinoma the prostate status post interstitial brachytherapy in March 2013 as above also has had recent hematuria felt secondary to probable left renal calculi noted on CT scan in December of 2021. We managed conservatively. He presents today with   09/13/2020 71 year old male who underwent a left-sided ureteroscopy 1 week ago. The procedure was uncomplicated and he tolerated it well. He presents today for stent removal. He denies nausea,  vomiting. He denies fevers and chills. He is not having any flank pain or discomfort. He does report some irritative voiding symptoms.  -11/21/20-patient status post recent left ureteroscopy with laser on 09/06/2020. Stent removed on 09/13/2020. Is now here for follow-up of his prostate cancer. He underwent interstitial brachytherapy in March 2013 PSAs have been undetectable. Last PSA 04/18/2020.  KUB is reviewed today shows no obvious ureteral stone.  There is a persistent 8 mm calcification overlying the left lower pole area consistent with known stone in the left kidney. No interim GU issues or hematuria.  -05/15/21-patient with history of nephrolithiasis also history of adenocarcinoma the prostate status post interstitial brachytherapy in 2013. PSAs have been undetectable. Last PSA undetectable on 05/10/2021. No interim stone issues. Here for follow-up KUB.  Micro urinalysis  KUB is reviewed today and shows: Persistent calcification over the left lower calyceal area measuring about 1 cm in size.   -11/22/21-patient with known left renal calculus and history of adenocarcinoma the prostate status post interstitial brachytherapy in 2013. PSAs been undetectable with last value being undetectable on 05/10/2021. No interim stone issues/hematuria. Here for follow-up KUB. Anticoagulation and would be at high risk for interventional treatment of his stone.  Micro urinalysis shows 6-10 WBCs greater than 60 RBCs and rare bacteria.  KUB is reviewed today and shows: Spinal fusion rods L4-5, interstitial brachytherapy seeds noted. There is persistent calcification approximately 8-59m in size overlying the left lower pole area of the left renal contour which is unchanged. No stone seen overlying the right renal shadow or over the expected course of the ureters.   04/09/2022  History as above. Briefly, has a history of prostate cancer status post brachytherapy in 2013. Also has a history of nephrolithiasis and has undergone multiple stone procedures in the past. Last underwent left ureteroscopy on 09/06/2020. He comes in today with a 1 day history of severe left-sided flank pain, nausea, vomiting. Also had shaking and chills last night. Urinalysis consistent with UTI. Temperature 100.6, heart rate 95.  04/21/22-Patient with history of left-sided flank pain and 4 mm left distal ureteral calculus which required urgent stenting on 04/09/2022 by Dr. BGloriann Loan  Patient has been  treated for UTI with antibiotics and now presents to undergo definitive management of his left distal ureteral calculus.  He will be scheduled for cystoscopy left ureteroscopy and laser lithotripsy and likely left JJ stent exchange   ALLERGIES: Aspirin TABS Trimox CAPS    MEDICATIONS: Allegra 60 mg capsule Oral  Atorvastatin Calcium 10 mg tablet Oral  Famotidine 40 mg tablet  Fish Oil CAPS Oral  Lisinopril 10 mg tablet Oral  Melatonin 5 mg capsule Oral  Metformin Hcl 500 mg tablet Oral  Multivitamin/Iron TABS Oral  Omega 3  Rosuvastatin Calcium 10 mg tablet  Tramadol Hcl  Trazodone Hcl 100 mg tablet  Vitamin B Complex  Vitamin B12  Vitamin C  Xarelto 20 mg tablet     GU PSH: Cysto Remove Stent FB Sim - 2022 Cysto Uretero Balloon Dil Strict - 2015 Cysto Uretero Lithotripsy - 2015, 2013, 2010 Cystoscopy - 05/23/2020, 2017 Cystoscopy Insert Stent - 2015, 2013, 2010 ESWL - 2015, 2010 Locm 300-'399Mg'$ /Ml Iodine,1Ml - 05/04/2020 Prostate Needle Biopsy - 2012 Ureteroscopic laser litho - 2022       PSH Notes: Lithotripsy, Cystoscopy With Ureteroscopy With Lithotripsy, Cystourethroscopy W/ Ureteroscopy W/ Tx Of Ureteral Strict, Cystoscopy With Insertion Of Ureteral Stent Right, Cystoscopy With Ureteroscopy With Lithotripsy, Cystoscopy With Insertion Of Ureteral Stent Right, Radiation Therapy,  Biopsy Of The Prostate Needle, Cystoscopy With Pyeloscopy With Lithotripsy, Lithotripsy, Cystoscopy With Insertion Of Ureteral Stent Left, Hernia Repair, Knee Surgery   NON-GU PSH: Carpal Tunnel Surgery.Marland Kitchen Hernia Repair - 2010 Knee replacement, Left, 08/2018     GU PMH: History of prostate cancer - 11/22/2021, - 11/21/2020, - 05/23/2020, - 04/25/2020 (Stable), His prostate remains smooth and benign. His PSA is undetectable and therefore he will return again in a year for DRE and PSA., - 2020 (Stable), His prostate was completely benign to exam but his PSA has increased slightly from where was a year  ago. It still remains exceedingly low. I told him because of this I wanted to recheck the PSA again in 6 months and then will see him back again in 12 months for another PSA and DRE at that time., - 2019 (Stable), His prostate remains benign to exam with a PSA that remains exceedingly low. I have therefore recommended continued yearly DRE and PSA., - 2018 (Stable), Prostate is flat and smooth. In addition his PSA remains exceedingly low. I told him that this likely indicates complete care. I will continue to monitor him on a yearly basis with DRE and PSA., - 2017 Microscopic hematuria - 11/22/2021 Renal calculus - 11/22/2021, - 05/15/2021 (Stable), - 11/21/2020, - 2022 (Stable), - 05/23/2020, - 2017 (Stable), Left, His left renal calculus remained stable with no evidence of metabolic stone activity. It sounds as if he may of passed a small stone recently. That would account for the fact that I didn't see a calcification last year that was not present on his KUB within the left kidney this year., - 2017, Bilateral kidney stones, - 2015 Acute Cystitis/UTI - 05/15/2021 Gross hematuria - 05/15/2021, - 05/23/2020, - 05/04/2020, - 04/25/2020 (Stable), I have reassured the patient that with no abnormality of the upper tract and no abnormality of the bladder that his hematuria is almost certainly coming from the thinned superficial blood vessels of his prostatic urethra secondary to the effects of radiation. I did not recommend any form of surgical or pharmacologic therapy but if he continues to have recurrent bleeding he will contact me. Otherwise I have reassured him. 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  He had lithotripsy of the stone previously without fragmentation.  Right ureteroscopy and laser lithotripsy of a UPJ stone in10/13.  Right ureteroscopy and laser lithotripsy in 5/15.  ESWL 7/15 - right renal pelvis.  Stone analysis: 100% calcium oxalate  Stone risk: Normal serum studies. 24 hour urine showed hypocitraturia and low urine volume.   Erectile dysfunction: He developed erectile dysfunction which was likely secondary to both a combination of his diabetes but more likely from the effects of his prostate radiation.  Current therapy: Viagra     NON-GU PMH: Stenosis of anus and rectum, Rectal/anal stenosis -  2015 Encounter for general adult medical examination without abnormal findings, Encounter for preventive health examination - 2015 Personal history of other endocrine, nutritional and metabolic disease, History of type 2 diabetes mellitus - 2015    FAMILY HISTORY: No pertinent family history - Other   SOCIAL HISTORY: Marital Status: Married Preferred Language: English; Ethnicity: Not Hispanic Or Latino; Race: White Current Smoking Status: Patient does not smoke anymore.  Does drink.  Drinks 1 caffeinated drink per day.     Notes: Former smoker, Alcohol Use, Tobacco Use, Occupation:, Caffeine Use, Marital History - Currently Married   REVIEW OF SYSTEMS:    GU Review Male:   Patient denies frequent urination, hard to postpone urination, burning/ pain with urination, get up at night to urinate, leakage of urine, stream starts and stops, trouble starting your stream, have to  strain to urinate , erection problems, and penile pain.  Gastrointestinal (Upper):   Patient denies nausea, vomiting, and indigestion/ heartburn.  Gastrointestinal (Lower):   Patient denies diarrhea and constipation.  Constitutional:   Patient denies fever, night sweats, weight loss, and fatigue.  Skin:   Patient denies skin rash/ lesion and itching.  Eyes:   Patient denies blurred vision and double vision.  Ears/ Nose/ Throat:   Patient denies sore throat and sinus problems.  Hematologic/Lymphatic:   Patient denies swollen glands and easy bruising.  Cardiovascular:   Patient denies leg swelling and chest pains.  Respiratory:   Patient denies cough and shortness of breath.  Endocrine:   Patient denies excessive thirst.  Musculoskeletal:   Patient denies back pain and joint pain.  Neurological:   Patient denies dizziness and headaches.  Psychologic:   Patient denies depression and anxiety.   VITAL SIGNS:      04/09/2022 01:21 PM   MULTI-SYSTEM PHYSICAL EXAMINATION:    Gastrointestinal: No mass, no tenderness, no rigidity, obese abdomen. No CVA tenderness  Heart: Regular rate and rhythm Lungs clear to auscultation   Complexity of Data:  Source Of History:  Patient  Records Review:   Previous Doctor Records, Previous Patient Records  Urine Test Review:   Urinalysis  X-Ray Review: C.T. Abdomen/Pelvis: Reviewed Films. Reviewed Report. Discussed With Patient.     05/10/21 04/18/20 04/25/19 10/19/18 04/14/18 04/16/17 04/21/16 04/24/15  PSA  Total PSA <0.015 ng/mL <0.015 ng/mL <0.015 ng/mL <0.015 ng/mL 0.12 ng/mL <0.015 ng/mL 0.021 ng/dl 0.04     PROCEDURES:         C.T. Urogram - [id number]      Patient confirmed No Neulasta OnPro Device.         Urinalysis w/Scope Dipstick Dipstick Cont'd Micro  Color: Yellow Bilirubin: Neg mg/dL WBC/hpf: >60/hpf  Appearance: Turbid Ketones: Neg mg/dL RBC/hpf: 20 - 40/hpf  Specific Gravity: 1.025 Blood: 3+ ery/uL Bacteria: Many (>50/hpf)  pH: 5.5  Protein: 1+ mg/dL Cystals: NS (Not Seen)  Glucose: Neg mg/dL Urobilinogen: 0.2 mg/dL Casts: NS (Not Seen)    Nitrites: Neg Trichomonas: Not Present    Leukocyte Esterase: 3+ leu/uL Mucous: Not Present      Epithelial Cells: NS (Not Seen)      Yeast: NS (Not Seen)      Sperm: Not Present    ASSESSMENT:      ICD-10 Details  1 GU:   Flank Pain - R10.84 Undiagnosed New Problem  2   Microscopic hematuria - R31.21 Undiagnosed New Problem  3   Acute Cystitis/UTI - N30.00 Undiagnosed New Problem  4   Renal and ureteral calculus - N20.2  Undiagnosed New Problem Status post left ureteral stent  PLAN:  Schedule for cystoscopy left ureteroscopy laser lithotripsy and stone extraction of left distal ureteral calculus.

## 2022-04-21 NOTE — Anesthesia Preprocedure Evaluation (Addendum)
Anesthesia Evaluation  Patient identified by MRN, date of birth, ID band Patient awake    Reviewed: Allergy & Precautions, NPO status , Patient's Chart, lab work & pertinent test results  Airway Mallampati: II  TM Distance: >3 FB Neck ROM: Full    Dental no notable dental hx. (+) Chipped   Pulmonary sleep apnea and Continuous Positive Airway Pressure Ventilation , former smoker   Pulmonary exam normal breath sounds clear to auscultation       Cardiovascular hypertension, Normal cardiovascular exam Rhythm:Regular Rate:Normal  02/2019 Echo The left ventricle has normal systolic function with an ejection  fraction of 60-65%. The cavity size was mildly dilated.     Neuro/Psych  Neuromuscular disease    GI/Hepatic ,GERD  ,,  Endo/Other  diabetes, Well Controlled, Type 2, Oral Hypoglycemic Agents  Morbid obesity (BMI 40.2)  Renal/GU ARFRenal diseaseL renal calculus  Lab Results      Component                Value               Date                      CREATININE               1.96 (H)            04/11/2022                 K                        3.8                 04/11/2022                  Hx of prostate ca S/P seed    Musculoskeletal  (+) Arthritis , Osteoarthritis,    Abdominal   Peds  Hematology Lab Results      Component                Value               Date                              HGB                      12.3 (L)            04/10/2022                HCT                      36.7 (L)            04/10/2022                      PLT                      139 (L)             04/10/2022        On Xarelto        Anesthesia Other Findings All: Amoxicillin, ASA  Reproductive/Obstetrics  Anesthesia Physical Anesthesia Plan  ASA: 3  Anesthesia Plan: General   Post-op Pain Management: Dilaudid IV and Ofirmev IV (intra-op)*   Induction:  Intravenous  PONV Risk Score and Plan: 3 and Treatment may vary due to age or medical condition, Midazolam and Ondansetron  Airway Management Planned: LMA  Additional Equipment: None  Intra-op Plan:   Post-operative Plan:   Informed Consent: I have reviewed the patients History and Physical, chart, labs and discussed the procedure including the risks, benefits and alternatives for the proposed anesthesia with the patient or authorized representative who has indicated his/her understanding and acceptance.     Dental advisory given  Plan Discussed with:   Anesthesia Plan Comments: (See PAT note 04/18/2022)        Anesthesia Quick Evaluation

## 2022-04-21 NOTE — Progress Notes (Signed)
Anesthesia Chart Review   Case: [id number] Date/Time: 04/22/22 1015   Procedure: CYSTOSCOPY/URETEROSCOPY/HOLMIUM LASER/STENT REPLACEMENT (Left) - 1 HR   Anesthesia type: General   Pre-op diagnosis: URETERAL CALCULUS   Location: [facility]OM 08 / [facility]   Surgeons: Remi Haggard, MD       DISCUSSION:70 y.o. former smoker with h/o HTN, DM II, CKD Stage II, OSA on CPAP, PE, ureteral calculus scheduled for above procedure 04/22/2022 with Dr. Harold Barban.   Pt last seen by cardiology 04/17/2022. Per OV note, "In my opinion he can safely withdraw his anticoagulant for this elective surgery as outlined and is optimized from a cardiology perspective Will ask him in a month from now to have a noncontrast CT to follow his thoracic aorta mildly to moderately enlarged but he had a CT of the chest done a year ago with no mention of enlargement.  Electively recheck his thoracic aorta no need to delay for his planned urologic surgery he does not have aneurysm"  Last dose of xarelto 04/19/2022.   Anticipate pt can proceed with planned procedure barring acute status change.   VS: BP 125/77   Pulse 60   Temp 36.6 C (Oral)   Ht '5\' 6"'$  (1.676 m)   Wt 112.9 kg   SpO2 95%   BMI 40.19 kg/m   PROVIDERS: Angelina Sheriff, MD is PCP   Shirlee More, MD is Cardiologist  LABS: Labs reviewed: Acceptable for surgery. (all labs ordered are listed, but only abnormal results are displayed)  Labs Reviewed  HEMOGLOBIN A1C - Abnormal; Notable for the following components:      Result Value   Hgb A1c MFr Bld 6.2 (*)    All other components within normal limits  GLUCOSE, CAPILLARY - Abnormal; Notable for the following components:   Glucose-Capillary 111 (*)    All other components within normal limits     IMAGES: VAS [place] AAA Duplex 05/21/2020 Summary:  Abdominal Aorta: No evidence of an abdominal aortic aneurysm was  visualized. The largest aortic measurement is 2.9 cm. No previous exam  available  for comparison.  IVC/Iliac: No evidence of thrombus in IVC and Iliac veins.   EKG:   CV: Echo 02/03/2019  1. The left ventricle has normal systolic function with an ejection  fraction of 60-65%. The cavity size was mildly dilated. Left ventricular  diastolic Doppler parameters are consistent with impaired relaxation.   2. The right ventricle has normal systolic function. The cavity was  normal.   3. The mitral valve is grossly normal.   4. The tricuspid valve is grossly normal.   5. The aortic valve is tricuspid. Mild thickening of the aortic valve.  Aortic valve regurgitation is mild by color flow Doppler. No stenosis of  the aortic valve.   6. The aorta is normal unless otherwise noted.   7. Technically difficult; definity used; mild to moderate global  reduction in LV systolic function (EF 45); sclerotic aortic valve with  mild AI.   Myocardial Perfusion 05/18/2018 The left ventricular ejection fraction is normal (55-65%). Nuclear stress EF: 55%. There was no ST segment deviation noted during stress. The study is normal. This is a low risk study. 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   Overview:  43 mm ascending, 11/22/15   Bilateral hearing loss 07/19/2019   Cataract    forming bilaterally    Chest pain    Chest pain in adult 04/13/2018   Chronic tonsillitis 07/19/2019   CKD (chronic kidney disease), stage II 52/84/1324   Complication of anesthesia    low heart rate during lithotripsy procedure   Deviated septum 07/19/2019   Diabetes ([place]) 03/16/2017   Essential hypertension 10/14/2015   GERD (gastroesophageal reflux disease)     Hematuria    Hemorrhoid    History of kidney stones    Hyperlipidemia    Hypertension    Impingement of left ankle joint 03/06/2021   Kidney stone 03/16/2017   Lumbar radiculopathy 03/16/2017   Nasal turbinate hypertrophy 07/19/2019   OSA (obstructive sleep apnea)    OSA on CPAP    Paronychia 08/16/2015   PE (pulmonary thromboembolism) ([place]) 02/2019   Posterior tibialis tendinitis of both lower extremities 02/02/2020   Preoperative cardiovascular examination 08/19/2016   Pronation deformity of both feet 02/02/2020   Prostate cancer ([place]) 03/16/2017   Right flank pain    Right ureteral stone    Skin cancer    Sleep apnea    Tibialis posterior tendinitis 02/11/2021   Tinnitus, bilateral 08/31/2019   Type 2 diabetes, diet controlled ([place])    off Metformin > 1 yr now as of PV 02-23-18   Ureteral calculus, right 03/15/2012   UTI (urinary tract infection) 04/09/2022   Wears glasses     Past Surgical History:  Procedure Laterality Date   BACK SURGERY  2019   rod and screws    BILATERAL INGUINAL HERNIA REPAIR  1989   CARDIAC CATHETERIZATION  01-11-2004   NO EVIDENCE SIGNIFICANT EPICARDIAL FLOW LIMITING 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   CARPAL TUNNEL RELEASE Left 06/2016   CARPAL TUNNEL RELEASE Right 03/2017   COLONOSCOPY     last with medoff unsure when    [place], URETEROSCOPY AND STENT PLACEMENT Left 04/09/2022   Procedure: CYSTOSCOPY WITH RETROGRADE PYELOGRAM AND STENT PLACEMENT;  Surgeon: Lucas Mallow, MD;  Location[facility];  Service: Urology;  Laterality: Left;   CYSTOSCOPY WITH URETEROSCOPY AND STENT PLACEMENT Right 10/28/2013   Procedure: RIGHT URETEROSCOPY AND STENT PLACEMENT;  Surgeon: [name], MD;  Location[facility];  Service: Urology;  Laterality: Right;   CYSTOSCOPY/URETEROSCOPY/HOLMIUM LASER/STENT PLACEMENT Left 09/06/2020   Procedure: CYSTOSCOPY/POSSIBLE URETEROSCOPY/HOLMIUM  LASER/STENT PLACEMENT;  Surgeon: Remi Haggard, MD;  [facility];  Service: Urology;  Laterality: Left;  30 MINS   EXTRACORPOREAL SHOCK WAVE LITHOTRIPSY  02-08-2009   HOLMIUM LASER APPLICATION Right 09/01/270   Procedure: HOLMIUM LASER LITHO;  Surgeon: [name], MD;  Location: [facility];  Service: Urology;  Laterality: Right;   LEFT KNEE [place]   LEFT URETEROSCOPIC STONE EXTRACTION  02-23-2009   POLYPECTOMY  2014   1 polyp per dr Steve Rattler notes- no surg path to determine polyps type but was a 5 yr recall    RADIOACTIVE SEED IMPLANT  08/08/2011   Procedure: RADIOACTIVE SEED IMPLANT;  Surgeon: [name], MD;  Location: [facility];  Service: Urology;  Laterality: N/A;  seeds implanted 50 seeds found in bladder=none    REPLACEMENT TOTAL KNEE Left 2018   SKIN  CANCER EXCISION     Burned off left side of nose, and head   TOTAL KNEE ARTHROPLASTY Right 08/2018   UPPER GASTROINTESTINAL ENDOSCOPY     URETEROSCOPY  03/15/2012   Procedure: URETEROSCOPY;  Surgeon: [name], MD;  Location: [facility];  Service: Urology;  Laterality: Right;  Right Ueteroscopy     MEDICATIONS:  atorvastatin (LIPITOR) 10 MG tablet   Carboxymethylcellul-Glycerin (REFRESH OPTIVE PF OP)   cholecalciferol (VITAMIN D3) 25 MCG (1000 UT) tablet   famotidine (PEPCID) 40 MG tablet   fexofenadine (ALLEGRA) 180 MG tablet   fluticasone (FLONASE) 50 MCG/ACT nasal spray   lisinopril (PRINIVIL,ZESTRIL) 10 MG tablet   Melatonin 5 MG CAPS   metFORMIN (GLUCOPHAGE) 500 MG tablet   omega-3 acid ethyl esters (LOVAZA) 1 g capsule   omeprazole (PRILOSEC) 20 MG capsule   Rivaroxaban (XARELTO) 15 MG TABS tablet   traMADol (ULTRAM) 50 MG tablet   traZODone (DESYREL) 100 MG tablet   zolpidem (AMBIEN CR) 12.5 MG CR tablet   No current facility-administered medications for this encounter.   Konrad Felix Ward, PA-C [facility] [phone]

## 2022-04-22 ENCOUNTER — Other Ambulatory Visit: Payer: Self-pay

## 2022-04-22 ENCOUNTER — Encounter (HOSPITAL_COMMUNITY): Payer: Self-pay | Admitting: Urology

## 2022-04-22 ENCOUNTER — Encounter (HOSPITAL_COMMUNITY)
Admission: RE | Disposition: A | Discharge status: Home / Self Care | Payer: Self-pay | Source: Home / Self Care | Attending: Urology

## 2022-04-22 ENCOUNTER — Ambulatory Visit (HOSPITAL_COMMUNITY)
Admission: RE | Admit: 2022-04-22 | Discharge: 2022-04-22 | Disposition: A | Discharge status: Home / Self Care | Payer: Medicare Other | Attending: Urology | Admitting: Urology

## 2022-04-22 ENCOUNTER — Ambulatory Visit (HOSPITAL_COMMUNITY): Payer: Medicare Other | Admitting: Physician Assistant

## 2022-04-22 ENCOUNTER — Ambulatory Visit (HOSPITAL_BASED_OUTPATIENT_CLINIC_OR_DEPARTMENT_OTHER): Payer: Medicare Other | Admitting: Anesthesiology

## 2022-04-22 ENCOUNTER — Ambulatory Visit (HOSPITAL_COMMUNITY): Payer: Medicare Other

## 2022-04-22 DIAGNOSIS — N201 Calculus of ureter: Secondary | ICD-10-CM | POA: Diagnosis not present

## 2022-04-22 DIAGNOSIS — E119 Type 2 diabetes mellitus without complications: Secondary | ICD-10-CM | POA: Insufficient documentation

## 2022-04-22 DIAGNOSIS — Z87891 Personal history of nicotine dependence: Secondary | ICD-10-CM

## 2022-04-22 DIAGNOSIS — N202 Calculus of kidney with calculus of ureter: Secondary | ICD-10-CM | POA: Diagnosis not present

## 2022-04-22 DIAGNOSIS — Z8546 Personal history of malignant neoplasm of prostate: Secondary | ICD-10-CM | POA: Diagnosis not present

## 2022-04-22 DIAGNOSIS — Z7984 Long term (current) use of oral hypoglycemic drugs: Secondary | ICD-10-CM | POA: Insufficient documentation

## 2022-04-22 DIAGNOSIS — N3000 Acute cystitis without hematuria: Secondary | ICD-10-CM | POA: Diagnosis not present

## 2022-04-22 DIAGNOSIS — G4733 Obstructive sleep apnea (adult) (pediatric): Secondary | ICD-10-CM

## 2022-04-22 DIAGNOSIS — Z9989 Dependence on other enabling machines and devices: Secondary | ICD-10-CM | POA: Diagnosis not present

## 2022-04-22 DIAGNOSIS — I10 Essential (primary) hypertension: Secondary | ICD-10-CM

## 2022-04-22 DIAGNOSIS — Z923 Personal history of irradiation: Secondary | ICD-10-CM | POA: Insufficient documentation

## 2022-04-22 DIAGNOSIS — Z6841 Body Mass Index (BMI) 40.0 and over, adult: Secondary | ICD-10-CM | POA: Insufficient documentation

## 2022-04-22 DIAGNOSIS — N138 Other obstructive and reflux uropathy: Secondary | ICD-10-CM | POA: Diagnosis not present

## 2022-04-22 DIAGNOSIS — K219 Gastro-esophageal reflux disease without esophagitis: Secondary | ICD-10-CM | POA: Diagnosis not present

## 2022-04-22 DIAGNOSIS — G4730 Sleep apnea, unspecified: Secondary | ICD-10-CM | POA: Insufficient documentation

## 2022-04-22 HISTORY — PX: CYSTOSCOPY/URETEROSCOPY/HOLMIUM LASER/STENT PLACEMENT: SHX6546

## 2022-04-22 LAB — GLUCOSE, CAPILLARY
Glucose-Capillary: 143 mg/dL — ABNORMAL HIGH (ref 70–99)
Glucose-Capillary: 129 mg/dL — ABNORMAL HIGH (ref 70–99)

## 2022-04-22 SURGERY — CYSTOSCOPY/URETEROSCOPY/HOLMIUM LASER/STENT PLACEMENT
Anesthesia: General | Laterality: Left

## 2022-04-22 MED ORDER — ONDANSETRON HCL 4 MG/2ML IJ SOLN
INTRAMUSCULAR | Status: DC | PRN
  Administered 2022-04-22: 4 mg via INTRAVENOUS

## 2022-04-22 MED ORDER — DEXAMETHASONE SODIUM PHOSPHATE 10 MG/ML IJ SOLN
INTRAMUSCULAR | Status: AC
  Filled 2022-04-22: qty 1

## 2022-04-22 MED ORDER — PROPOFOL 10 MG/ML IV BOLUS
INTRAVENOUS | Status: AC
  Filled 2022-04-22: qty 20

## 2022-04-22 MED ORDER — DEXAMETHASONE SODIUM PHOSPHATE 10 MG/ML IJ SOLN
INTRAMUSCULAR | Status: DC | PRN
  Administered 2022-04-22: 10 mg via INTRAVENOUS

## 2022-04-22 MED ORDER — OXYCODONE HCL 5 MG PO TABS: 5.0000 mg | ORAL_TABLET | Freq: Once | ORAL | Status: DC | PRN

## 2022-04-22 MED ORDER — ONDANSETRON HCL 4 MG/2ML IJ SOLN: 4.0000 mg | Freq: Once | INTRAMUSCULAR | Status: DC | PRN

## 2022-04-22 MED ORDER — EPHEDRINE SULFATE-NACL 50-0.9 MG/10ML-% IV SOSY
PREFILLED_SYRINGE | INTRAVENOUS | Status: DC | PRN
  Administered 2022-04-22: 10 mg via INTRAVENOUS
  Administered 2022-04-22: 5 mg via INTRAVENOUS

## 2022-04-22 MED ORDER — IOHEXOL 300 MG/ML  SOLN
INTRAMUSCULAR | Status: DC | PRN
  Administered 2022-04-22: 8 mL

## 2022-04-22 MED ORDER — FENTANYL CITRATE (PF) 100 MCG/2ML IJ SOLN
INTRAMUSCULAR | Status: DC | PRN
  Administered 2022-04-22: 25 ug via INTRAVENOUS
  Administered 2022-04-22: 75 ug via INTRAVENOUS

## 2022-04-22 MED ORDER — ARTIFICIAL TEARS OPHTHALMIC OINT
TOPICAL_OINTMENT | OPHTHALMIC | Status: AC
  Filled 2022-04-22: qty 7

## 2022-04-22 MED ORDER — LIDOCAINE 2% (20 MG/ML) 5 ML SYRINGE
INTRAMUSCULAR | Status: DC | PRN
  Administered 2022-04-22: 100 mg via INTRAVENOUS

## 2022-04-22 MED ORDER — LACTATED RINGERS IV SOLN: INTRAVENOUS | Status: DC

## 2022-04-22 MED ORDER — FENTANYL CITRATE (PF) 100 MCG/2ML IJ SOLN
INTRAMUSCULAR | Status: AC
  Filled 2022-04-22: qty 2

## 2022-04-22 MED ORDER — CEFAZOLIN SODIUM-DEXTROSE 2-4 GM/100ML-% IV SOLN
2.0000 g | INTRAVENOUS | Status: AC
  Administered 2022-04-22: 2 g via INTRAVENOUS
  Filled 2022-04-22: qty 100

## 2022-04-22 MED ORDER — PHENYLEPHRINE HCL-NACL 20-0.9 MG/250ML-% IV SOLN
INTRAVENOUS | Status: DC | PRN
  Administered 2022-04-22: 50 ug/min via INTRAVENOUS

## 2022-04-22 MED ORDER — LIDOCAINE HCL (PF) 2 % IJ SOLN
INTRAMUSCULAR | Status: AC
  Filled 2022-04-22: qty 5

## 2022-04-22 MED ORDER — OXYCODONE HCL 5 MG/5ML PO SOLN: 5.0000 mg | Freq: Once | ORAL | Status: DC | PRN

## 2022-04-22 MED ORDER — PROPOFOL 10 MG/ML IV BOLUS
INTRAVENOUS | Status: DC | PRN
  Administered 2022-04-22: 200 mg via INTRAVENOUS

## 2022-04-22 MED ORDER — SODIUM CHLORIDE 0.9 % IR SOLN
Status: DC | PRN
  Administered 2022-04-22: 3000 mL via INTRAVESICAL

## 2022-04-22 MED ORDER — ORAL CARE MOUTH RINSE: 15.0000 mL | Freq: Once | OROMUCOSAL | Status: AC

## 2022-04-22 MED ORDER — HYDROMORPHONE HCL 1 MG/ML IJ SOLN: 0.2500 mg | INTRAMUSCULAR | Status: DC | PRN

## 2022-04-22 MED ORDER — ONDANSETRON HCL 4 MG/2ML IJ SOLN
INTRAMUSCULAR | Status: AC
  Filled 2022-04-22: qty 2

## 2022-04-22 MED ORDER — CHLORHEXIDINE GLUCONATE 0.12 % MT SOLN
15.0000 mL | Freq: Once | OROMUCOSAL | Status: AC
  Administered 2022-04-22: 15 mL via OROMUCOSAL

## 2022-04-22 MED ORDER — ACETAMINOPHEN 10 MG/ML IV SOLN: 1000.0000 mg | Freq: Once | INTRAVENOUS | Status: DC | PRN

## 2022-04-22 SURGICAL SUPPLY — 23 items
BAG URO CATCHER STRL LF (MISCELLANEOUS) ×1 IMPLANT
BASKET ZERO TIP NITINOL 2.4FR (BASKET) IMPLANT
BSKT STON RTRVL ZERO TP 2.4FR (BASKET)
BULB IRRIG PATHFIND (MISCELLANEOUS) ×1 IMPLANT
CATH URETL OPEN 5X70 (CATHETERS) IMPLANT
CLOTH BEACON ORANGE TIMEOUT ST (SAFETY) ×1 IMPLANT
EXTRACTOR STONE 1.7FRX115CM (UROLOGICAL SUPPLIES) IMPLANT
GLOVE SURG LX STRL 7.5 STRW (GLOVE) ×1 IMPLANT
GOWN STRL REUS W/ TWL XL LVL3 (GOWN DISPOSABLE) ×1 IMPLANT
GOWN STRL REUS W/TWL XL LVL3 (GOWN DISPOSABLE) ×1
GUIDEWIRE ANG ZIPWIRE 038X150 (WIRE) IMPLANT
GUIDEWIRE STR DUAL SENSOR (WIRE) ×1 IMPLANT
KIT TURNOVER KIT A (KITS) IMPLANT
LASER FIB FLEXIVA PULSE ID 365 (Laser) IMPLANT
MANIFOLD NEPTUNE II (INSTRUMENTS) ×1 IMPLANT
PACK CYSTO (CUSTOM PROCEDURE TRAY) ×1 IMPLANT
SHEATH NAVIGATOR HD 11/13X36 (SHEATH) IMPLANT
STENT URET 6FRX26 CONTOUR (STENTS) IMPLANT
SYR 20ML LL LF (SYRINGE) ×1 IMPLANT
TRACTIP FLEXIVA PULS ID 200XHI (Laser) IMPLANT
TRACTIP FLEXIVA PULSE ID 200 (Laser) ×1
TUBING CONNECTING 10 (TUBING) ×1 IMPLANT
TUBING UROLOGY SET (TUBING) ×1 IMPLANT

## 2022-04-22 NOTE — Interval H&P Note (Signed)
History and Physical Interval Note:  04/22/2022 10:14 AM  Joseph Pham  has presented today for surgery, with the diagnosis of URETERAL CALCULUS.  The various methods of treatment have been discussed with the patient and family. After consideration of risks, benefits and other options for treatment, the patient has consented to  Procedure(s) with comments: CYSTOSCOPY/URETEROSCOPY/HOLMIUM LASER/STENT REPLACEMENT (Left) - 1 HR as a surgical intervention.  The patient's history has been reviewed, patient examined, no change in status, stable for surgery.  I have reviewed the patient's chart and labs.  Questions were answered to the patient's satisfaction.     Remi Haggard

## 2022-04-22 NOTE — Op Note (Signed)
Preoperative diagnosis:  1.  Left distal ureteral calculus 2.  Left renal calculus  Postoperative diagnosis: 1.  Same 2.  Same  Procedure(s): 1.  Cystoscopy, left retrograde pyelogram with intraoperative crepitation, left ureteroscopy with laser lithotripsy of left distal ureteral calculus, stone extraction, attempted left pyeloscopy, left JJ stent exchange  Surgeon: Dr. Harold Barban  Anesthesia: General  Complications: None  EBL: Minimal  Specimens: Kidney stone fragments  Disposition of specimens: With a  Intraoperative findings: 6 mm left distal ureteral calculus with significant ureteral edema at stone lodgment site.  Able to fragment stone and completely extracted left distal ureteral stone.  Attempted to pass access sheath but ureter would not accommodate 11 French access sheath due to the narrowing in the ureter.  [place] by 26 cm Percuflex plus soft contour stent replaced with tether to the urethral meatus   Indication: 71 year old white male who presented with UTI and left distal ureteral calculus approximately 10 days ago.  Underwent placement of urgent left JJ stent by Dr. Gloriann Loan.  Presents this time to undergo left ureteroscopy and laser lithotripsy and stone extraction for definitive management of left distal stone and attempt at left pyeloscopy to treat large left lower calyceal stone.  Description of procedure:  After obtaining form consent the patient was taken the major cystoscopy suite placed under general anesthesia.  Placed in dorsolithotomy position genitalia prepped draped usual sterile fashion.  Proper pause and timeout was performed.  33 French cystoscope was advanced in the bladder.  Left JJ stent was identified.  This was removed utilizing alligator graspers.  Retrograde pyelogram was performed with intraoperative catheter confirmed filling defect in left distal ureter.  Also probable filling defect in the left lower calyx.  There is narrowing in the left  ureter near the distal stone.  I passed a sensor wire up to the left renal pelvis without difficulty cystoscope was removed.  6.4 French semirigid ureteroscope was advanced over the guidewire and into the left ureter up to the level of the stone.  I then attempted to laser the stone utilizing the 200 m laser fiber but due to the angle of the stone I was unable to get good purchase to the stone.  I removed the ureteroscope and advanced alongside the guidewire up to the stone and then was able to treat the stone adequately and fragment to this into 3-4 larger pieces.  Stone was very hard.  The engage basket was utilized to extract the fragments with separate passes of the ureteroscope without difficulty.  Final look in the ureter revealed no remaining fragments.  I passed the ureteroscope along its length proximally and there is a narrowed area of the at the prior stone lodgment site which is somewhat tight passing the ureteroscope.  I remove the ureteroscope and then passed the obturator for the 13 French ureteral access sheath which passed with minimal resistance.  I then attempted to pass the 11 French access sheath along with the obturator and could not past the narrowed side in the ureter.  Attempted this several times but did not feel comfortable pressing too hard on this area.  Repeated ureteral uroscopy with the semirigid scope attempting again to dilate this area but it was a somewhat tight ring of tissue and I did not feel comfortable balloon dilating this area.  Attempted again to pass the access sheath again without success due to the tight area in the ureter.  Retrograde pyelogram through the ureteroscope confirm the narrowed area no extravasation  of contrast ureteroscope was removed leaving the guidewire.  The wire was then back fed through the cystoscope and a 6 [place] by 26 cm Percuflex plus soft contour stent was placed leaving a proximal coil in the renal pelvis and a distal coil in the bladder.   Nylon suture was left on the distal end of the stent left protruding to the urethral meatus but was allowed to retract within the urethra to facilitate future removal.  No fragments were collected for the patient.  Procedure was terminated she was awakened from anesthesia and taken back to recovery in stable condition.  No immediate complication from the procedure.

## 2022-04-22 NOTE — Anesthesia Postprocedure Evaluation (Signed)
Anesthesia Post Note  Patient: Joseph Pham  Procedure(s) Performed: CYSTOSCOPY/URETEROSCOPY/HOLMIUM LASER/STENT REPLACEMENT (Left)     Patient location during evaluation: PACU Anesthesia Type: General Level of consciousness: awake and alert Pain management: pain level controlled Vital Signs Assessment: post-procedure vital signs reviewed and stable Respiratory status: spontaneous breathing, nonlabored ventilation, respiratory function stable and patient connected to nasal cannula oxygen Cardiovascular status: blood pressure returned to baseline and stable Postop Assessment: no apparent nausea or vomiting Anesthetic complications: no  No notable events documented.  Last Vitals:  Vitals:   04/22/22 1215 04/22/22 1236  BP: 121/72 (!) 143/86  Pulse: 69 73  Resp: 18 16  Temp: 36.7 C   SpO2: 93% 95%    Last Pain:  Vitals:   04/22/22 1236  TempSrc:   PainSc: 0-No pain                 Barnet Glasgow

## 2022-04-22 NOTE — Transfer of Care (Signed)
Immediate Anesthesia Transfer of Care Note  Patient: Joseph Pham  Procedure(s) Performed: CYSTOSCOPY/URETEROSCOPY/HOLMIUM LASER/STENT REPLACEMENT (Left)  Patient Location: PACU  Anesthesia Type:General  Level of Consciousness: awake, alert , and oriented  Airway & Oxygen Therapy: Patient Spontanous Breathing and Patient connected to face mask oxygen  Post-op Assessment: Report given to RN and Post -op Vital signs reviewed and stable  Post vital signs: Reviewed and stable  Last Vitals:  Vitals Value Taken Time  BP 137/73 04/22/22 1150  Temp    Pulse 70 04/22/22 1151  Resp 19 04/22/22 1151  SpO2 100 % 04/22/22 1151  Vitals shown include unvalidated device data.  Last Pain:  Vitals:   04/22/22 0848  TempSrc:   PainSc: 0-No pain         Complications: No notable events documented.

## 2022-04-22 NOTE — Anesthesia Procedure Notes (Signed)
Procedure Name: LMA Insertion Date/Time: 04/22/2022 10:37 AM  Performed by: Maxwell Caul, CRNAPre-anesthesia Checklist: Patient identified, Emergency Drugs available, Suction available and Patient being monitored Patient Re-evaluated:Patient Re-evaluated prior to induction Oxygen Delivery Method: Circle system utilized Preoxygenation: Pre-oxygenation with 100% oxygen Induction Type: IV induction LMA: LMA with gastric port inserted LMA Size: 5.0 Number of attempts: 1 Placement Confirmation: positive ETCO2 and breath sounds checked- equal and bilateral Tube secured with: Tape Dental Injury: Teeth and Oropharynx as per pre-operative assessment  Comments: Lubrication placed on lips prior to LMA placement (Patient complained of lip tenderness with prior anesthesia).

## 2022-04-22 NOTE — Progress Notes (Signed)
Patient's urethral stones were sent home with the patient.

## 2022-04-23 ENCOUNTER — Encounter (HOSPITAL_COMMUNITY): Payer: Self-pay | Admitting: Urology

## 2022-04-29 DIAGNOSIS — N202 Calculus of kidney with calculus of ureter: Secondary | ICD-10-CM | POA: Diagnosis not present

## 2022-05-03 ENCOUNTER — Other Ambulatory Visit: Payer: Self-pay

## 2022-05-03 ENCOUNTER — Inpatient Hospital Stay (HOSPITAL_COMMUNITY)
Admission: EM | Admit: 2022-05-03 | Discharge: 2022-05-05 | DRG: 660 | Disposition: A | Discharge status: Home / Self Care | Payer: Medicare Other | Attending: Internal Medicine | Admitting: Internal Medicine

## 2022-05-03 ENCOUNTER — Emergency Department (HOSPITAL_COMMUNITY): Payer: Medicare Other

## 2022-05-03 DIAGNOSIS — G4730 Sleep apnea, unspecified: Secondary | ICD-10-CM | POA: Diagnosis not present

## 2022-05-03 DIAGNOSIS — N179 Acute kidney failure, unspecified: Secondary | ICD-10-CM

## 2022-05-03 DIAGNOSIS — R109 Unspecified abdominal pain: Secondary | ICD-10-CM | POA: Diagnosis not present

## 2022-05-03 DIAGNOSIS — Z833 Family history of diabetes mellitus: Secondary | ICD-10-CM

## 2022-05-03 DIAGNOSIS — Z7984 Long term (current) use of oral hypoglycemic drugs: Secondary | ICD-10-CM | POA: Diagnosis not present

## 2022-05-03 DIAGNOSIS — Z886 Allergy status to analgesic agent status: Secondary | ICD-10-CM

## 2022-05-03 DIAGNOSIS — Z8546 Personal history of malignant neoplasm of prostate: Secondary | ICD-10-CM | POA: Diagnosis not present

## 2022-05-03 DIAGNOSIS — I129 Hypertensive chronic kidney disease with stage 1 through stage 4 chronic kidney disease, or unspecified chronic kidney disease: Secondary | ICD-10-CM | POA: Diagnosis present

## 2022-05-03 DIAGNOSIS — N132 Hydronephrosis with renal and ureteral calculous obstruction: Secondary | ICD-10-CM | POA: Diagnosis not present

## 2022-05-03 DIAGNOSIS — N202 Calculus of kidney with calculus of ureter: Secondary | ICD-10-CM | POA: Diagnosis present

## 2022-05-03 DIAGNOSIS — I10 Essential (primary) hypertension: Secondary | ICD-10-CM | POA: Diagnosis not present

## 2022-05-03 DIAGNOSIS — Z880 Allergy status to penicillin: Secondary | ICD-10-CM

## 2022-05-03 DIAGNOSIS — E1122 Type 2 diabetes mellitus with diabetic chronic kidney disease: Secondary | ICD-10-CM | POA: Diagnosis present

## 2022-05-03 DIAGNOSIS — Z79899 Other long term (current) drug therapy: Secondary | ICD-10-CM

## 2022-05-03 DIAGNOSIS — N1330 Unspecified hydronephrosis: Secondary | ICD-10-CM | POA: Diagnosis present

## 2022-05-03 DIAGNOSIS — Z85828 Personal history of other malignant neoplasm of skin: Secondary | ICD-10-CM

## 2022-05-03 DIAGNOSIS — N1832 Chronic kidney disease, stage 3b: Secondary | ICD-10-CM | POA: Diagnosis present

## 2022-05-03 DIAGNOSIS — N201 Calculus of ureter: Secondary | ICD-10-CM | POA: Diagnosis not present

## 2022-05-03 DIAGNOSIS — N200 Calculus of kidney: Secondary | ICD-10-CM

## 2022-05-03 DIAGNOSIS — E1151 Type 2 diabetes mellitus with diabetic peripheral angiopathy without gangrene: Secondary | ICD-10-CM | POA: Diagnosis present

## 2022-05-03 DIAGNOSIS — N390 Urinary tract infection, site not specified: Secondary | ICD-10-CM | POA: Diagnosis present

## 2022-05-03 DIAGNOSIS — E872 Acidosis, unspecified: Secondary | ICD-10-CM

## 2022-05-03 DIAGNOSIS — G4733 Obstructive sleep apnea (adult) (pediatric): Secondary | ICD-10-CM | POA: Diagnosis present

## 2022-05-03 DIAGNOSIS — N3000 Acute cystitis without hematuria: Secondary | ICD-10-CM

## 2022-05-03 DIAGNOSIS — I2782 Chronic pulmonary embolism: Secondary | ICD-10-CM | POA: Diagnosis present

## 2022-05-03 DIAGNOSIS — Z96653 Presence of artificial knee joint, bilateral: Secondary | ICD-10-CM | POA: Diagnosis present

## 2022-05-03 DIAGNOSIS — Z86711 Personal history of pulmonary embolism: Secondary | ICD-10-CM | POA: Diagnosis not present

## 2022-05-03 DIAGNOSIS — Z7901 Long term (current) use of anticoagulants: Secondary | ICD-10-CM

## 2022-05-03 DIAGNOSIS — I2699 Other pulmonary embolism without acute cor pulmonale: Secondary | ICD-10-CM | POA: Diagnosis not present

## 2022-05-03 DIAGNOSIS — Z87891 Personal history of nicotine dependence: Secondary | ICD-10-CM | POA: Diagnosis not present

## 2022-05-03 DIAGNOSIS — I714 Abdominal aortic aneurysm, without rupture, unspecified: Secondary | ICD-10-CM | POA: Diagnosis present

## 2022-05-03 DIAGNOSIS — R9341 Abnormal radiologic findings on diagnostic imaging of renal pelvis, ureter, or bladder: Secondary | ICD-10-CM | POA: Diagnosis present

## 2022-05-03 DIAGNOSIS — K219 Gastro-esophageal reflux disease without esophagitis: Secondary | ICD-10-CM | POA: Diagnosis present

## 2022-05-03 DIAGNOSIS — N136 Pyonephrosis: Principal | ICD-10-CM | POA: Diagnosis present

## 2022-05-03 DIAGNOSIS — K429 Umbilical hernia without obstruction or gangrene: Secondary | ICD-10-CM | POA: Diagnosis not present

## 2022-05-03 DIAGNOSIS — Z8249 Family history of ischemic heart disease and other diseases of the circulatory system: Secondary | ICD-10-CM | POA: Diagnosis not present

## 2022-05-03 DIAGNOSIS — E119 Type 2 diabetes mellitus without complications: Secondary | ICD-10-CM | POA: Diagnosis not present

## 2022-05-03 DIAGNOSIS — Z83719 Family history of colon polyps, unspecified: Secondary | ICD-10-CM | POA: Diagnosis not present

## 2022-05-03 DIAGNOSIS — N23 Unspecified renal colic: Secondary | ICD-10-CM | POA: Diagnosis not present

## 2022-05-03 DIAGNOSIS — K760 Fatty (change of) liver, not elsewhere classified: Secondary | ICD-10-CM | POA: Diagnosis not present

## 2022-05-03 DIAGNOSIS — E785 Hyperlipidemia, unspecified: Secondary | ICD-10-CM | POA: Diagnosis present

## 2022-05-03 LAB — CBC WITH DIFFERENTIAL/PLATELET
Abs Immature Granulocytes: 0.06 10*3/uL (ref 0.00–0.07)
Basophils Absolute: 0 10*3/uL (ref 0.0–0.1)
Basophils Relative: 0 %
Eosinophils Absolute: 0 10*3/uL (ref 0.0–0.5)
Eosinophils Relative: 0 %
HCT: 39.3 % (ref 39.0–52.0)
Hemoglobin: 13 g/dL (ref 13.0–17.0)
Immature Granulocytes: 1 %
Lymphocytes Relative: 12 %
Lymphs Abs: 1.5 10*3/uL (ref 0.7–4.0)
MCH: 29.6 pg (ref 26.0–34.0)
MCHC: 33.1 g/dL (ref 30.0–36.0)
MCV: 89.5 fL (ref 80.0–100.0)
Monocytes Absolute: 1.4 10*3/uL — ABNORMAL HIGH (ref 0.1–1.0)
Monocytes Relative: 11 %
Neutro Abs: 9 10*3/uL — ABNORMAL HIGH (ref 1.7–7.7)
Neutrophils Relative %: 76 %
Platelets: 206 10*3/uL (ref 150–400)
RBC: 4.39 MIL/uL (ref 4.22–5.81)
RDW: 13.2 % (ref 11.5–15.5)
WBC: 12 10*3/uL — ABNORMAL HIGH (ref 4.0–10.5)
nRBC: 0 % (ref 0.0–0.2)

## 2022-05-03 LAB — URINALYSIS, ROUTINE W REFLEX MICROSCOPIC
Bacteria, UA: NONE SEEN
Bilirubin Urine: NEGATIVE
Glucose, UA: NEGATIVE mg/dL
Ketones, ur: NEGATIVE mg/dL
Nitrite: NEGATIVE
Protein, ur: NEGATIVE mg/dL
Specific Gravity, Urine: 1.02 (ref 1.005–1.030)
WBC, UA: >50 WBC/hpf — ABNORMAL HIGH (ref 0–5)
pH: 6 (ref 5.0–8.0)

## 2022-05-03 LAB — COMPREHENSIVE METABOLIC PANEL
ALT: 15 U/L (ref 0–44)
AST: 17 U/L (ref 15–41)
Albumin: 3.4 g/dL — ABNORMAL LOW (ref 3.5–5.0)
Alkaline Phosphatase: 45 U/L (ref 38–126)
Anion gap: 11 (ref 5–15)
BUN: 24 mg/dL — ABNORMAL HIGH (ref 8–23)
CO2: 25 mmol/L (ref 22–32)
Calcium: 8.9 mg/dL (ref 8.9–10.3)
Chloride: 100 mmol/L (ref 98–111)
Creatinine, Ser: 2.23 mg/dL — ABNORMAL HIGH (ref 0.61–1.24)
GFR, Estimated: 31 mL/min — ABNORMAL LOW (ref >60)
Glucose, Bld: 141 mg/dL — ABNORMAL HIGH (ref 70–99)
Potassium: 4.1 mmol/L (ref 3.5–5.1)
Sodium: 136 mmol/L (ref 135–145)
Total Bilirubin: 0.9 mg/dL (ref 0.3–1.2)
Total Protein: 8.2 g/dL — ABNORMAL HIGH (ref 6.5–8.1)

## 2022-05-03 LAB — LACTIC ACID, PLASMA
Lactic Acid, Venous: 2.3 mmol/L (ref 0.5–1.9)
Lactic Acid, Venous: 1.4 mmol/L (ref 0.5–1.9)

## 2022-05-03 MED ORDER — ONDANSETRON HCL 4 MG/2ML IJ SOLN
4.0000 mg | Freq: Once | INTRAMUSCULAR | Status: AC
  Administered 2022-05-03: 4 mg via INTRAVENOUS
  Filled 2022-05-03: qty 2

## 2022-05-03 MED ORDER — SODIUM CHLORIDE 0.9 % IV BOLUS
500.0000 mL | Freq: Once | INTRAVENOUS | Status: AC
  Administered 2022-05-03: 500 mL via INTRAVENOUS

## 2022-05-03 MED ORDER — MORPHINE SULFATE (PF) 2 MG/ML IV SOLN
2.0000 mg | INTRAVENOUS | Status: DC | PRN
  Administered 2022-05-04: 2 mg via INTRAVENOUS
  Filled 2022-05-03: qty 1

## 2022-05-03 MED ORDER — MORPHINE SULFATE (PF) 4 MG/ML IV SOLN
4.0000 mg | Freq: Once | INTRAVENOUS | Status: AC
  Administered 2022-05-03: 4 mg via INTRAVENOUS
  Filled 2022-05-03: qty 1

## 2022-05-03 MED ORDER — SODIUM CHLORIDE 0.9 % IV SOLN
1.0000 g | Freq: Once | INTRAVENOUS | Status: AC
  Administered 2022-05-03: 1 g via INTRAVENOUS
  Filled 2022-05-03: qty 10

## 2022-05-03 MED ORDER — SODIUM CHLORIDE 0.9 % IV SOLN
1.0000 g | INTRAVENOUS | Status: DC
  Administered 2022-05-04: 1 g via INTRAVENOUS
  Filled 2022-05-03: qty 10

## 2022-05-03 MED ORDER — ONDANSETRON HCL 4 MG/2ML IJ SOLN: 4.0000 mg | Freq: Four times a day (QID) | INTRAMUSCULAR | Status: DC | PRN

## 2022-05-03 NOTE — ED Triage Notes (Signed)
C/o left flank pain with fever, t.max at home 100.6. Denies n/v/hematuria.  Stent removed on Tuesday from prior kidney stone.

## 2022-05-03 NOTE — Assessment & Plan Note (Signed)
-  Controlled. Last HbA1C of 6.2 on 04/18/22 -hold SSI while NPO overnight

## 2022-05-03 NOTE — Assessment & Plan Note (Signed)
-   continue nightly CPAP °

## 2022-05-03 NOTE — Assessment & Plan Note (Signed)
-  secondary to new 6 mm distal left ureteral calculus with moderate left hydroureteronephrosis -urology to take to OR tomorrow  -has received 1L NS bolus in ED -monitor repeat creatinine following procedure -avoid nephrotoxic agent

## 2022-05-03 NOTE — Assessment & Plan Note (Signed)
-  Hold ACE-I due to AKI

## 2022-05-03 NOTE — Assessment & Plan Note (Signed)
-  has received fluids. Follow repeat.

## 2022-05-03 NOTE — Assessment & Plan Note (Signed)
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 Last dose yesterday (12/1) morning. Continue to hold for urology procedure tomorrow.

## 2022-05-03 NOTE — Assessment & Plan Note (Signed)
-  presented with left flank pain with CT renal stone search showing new 6 mm distal left ureteral calculus with moderate left hydroureteronephrosis. -recently had urgent stent placement on 04/09/2022 and subsequently had urethroscope laser lithotripsy and stone extraction of the left distal ureteral calculi and left JJ stent exchange on 04/22/2022 urology Dr. Milford Cage. -urology to take for procedure tomorrow -NPO midnight

## 2022-05-03 NOTE — ED Provider Notes (Signed)
Emergency Department Provider Note   I have reviewed the triage vital signs and the nursing notes.   HISTORY  Chief Complaint Flank Pain   HPI Sanford is a 71 y.o. male past history reviewed below including AAA, HTN, HLD, prior PE on Xarelto presents to the ED with left flank pain similar to prior kidney stone pain.  He had recent kidney stone with lithotripsy and stenting on 11/21 with Dr. Milford Cage. Symptoms resolved and the remaining stent was removed in the office.  Patient had return of similar pain yesterday.  He has had nausea but no vomiting.  He notes that his wife recorded a temp of 100.6 F last night. His pain has worsened throughout the day and so he presents today for evaluation.    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   Overview:  43 mm ascending, 11/22/15   Bilateral hearing loss 07/19/2019   Cataract    forming bilaterally    Chest pain    Chest pain in adult 04/13/2018   Chronic tonsillitis 07/19/2019   CKD (chronic kidney disease), stage II 44/06/270   Complication of anesthesia    low heart rate during lithotripsy procedure   Deviated septum 07/19/2019   Diabetes ([place]) 03/16/2017   Essential hypertension 10/14/2015   GERD (gastroesophageal reflux disease)    Hematuria    Hemorrhoid    History of kidney stones    Hyperlipidemia    Hypertension    Impingement of left ankle joint 03/06/2021   Kidney stone 03/16/2017   Lumbar radiculopathy 03/16/2017   Nasal turbinate hypertrophy 07/19/2019   OSA (obstructive sleep apnea)    OSA on CPAP    Paronychia 08/16/2015   PE (pulmonary thromboembolism) ([place])  02/2019   Posterior tibialis tendinitis of both lower extremities 02/02/2020   Preoperative cardiovascular examination 08/19/2016   Pronation deformity of both feet 02/02/2020   Prostate cancer ([place]) 03/16/2017   Right flank pain    Right ureteral stone    Skin cancer    Sleep apnea    Tibialis posterior tendinitis 02/11/2021   Tinnitus, bilateral 08/31/2019   Type 2 diabetes, diet controlled ([place])    off Metformin > 1 yr now as of PV 02-23-18   Ureteral calculus, right 03/15/2012   UTI (urinary tract infection) 04/09/2022   Wears glasses     Review of Systems  Constitutional: Positive fever at home in the last 12 hours.  Eyes: No visual changes. ENT: No sore throat. Cardiovascular: Denies chest pain. Respiratory: Denies shortness of breath. Gastrointestinal: No abdominal pain.  Mild nausea, no vomiting.  No diarrhea.  No constipation. Positive left flank pain.  Genitourinary: Negative for dysuria. Musculoskeletal: Negative for back pain. Skin: Negative for rash. Neurological: Negative for headaches, focal weakness or numbness.   ____________________________________________   PHYSICAL EXAM:  VITAL SIGNS: ED Triage Vitals  Enc Vitals Group     BP 05/03/22 1250 129/83     Pulse Rate 05/03/22 1250 100     Resp 05/03/22 1250 20     Temp 05/03/22 1307 98.3 F (36.8 C)     Temp Source 05/03/22 1307 Oral  SpO2 05/03/22 1250 98 %   Constitutional: Alert and oriented. Well appearing and in no acute distress. Eyes: Conjunctivae are normal.  Head: Atraumatic. Nose: No congestion/rhinnorhea. Mouth/Throat: Mucous membranes are moist.  Neck: No stridor.   Cardiovascular: Normal rate, regular rhythm. Good peripheral circulation. Grossly normal heart sounds.   Respiratory: Normal respiratory effort.  No retractions. Lungs CTAB. Gastrointestinal: Soft and nontender. No distention. Positive left CVA tenderness. Musculoskeletal: No lower extremity tenderness nor edema. No  gross deformities of extremities. Neurologic:  Normal speech and language. No gross focal neurologic deficits are appreciated.  Skin:  Skin is warm, dry and intact. No rash noted.  ____________________________________________   LABS (all labs ordered are listed, but only abnormal results are displayed)  Labs Reviewed  URINE CULTURE  COMPREHENSIVE METABOLIC PANEL  CBC WITH DIFFERENTIAL/PLATELET  URINALYSIS, ROUTINE W REFLEX MICROSCOPIC  LACTIC ACID, PLASMA  LACTIC ACID, PLASMA   ____________________________________________  RADIOLOGY  No results found.  ____________________________________________   PROCEDURES  Procedure(s) performed:   Procedures   ____________________________________________   INITIAL IMPRESSION / ASSESSMENT AND PLAN / ED COURSE  Pertinent labs & imaging results that were available during my care of the patient were reviewed by me and considered in my medical decision making (see chart for details).   This patient is Presenting for Evaluation of flank pain, which does require a range of treatment options, and is a complaint that involves a high risk of morbidity and mortality.  The Differential Diagnoses includes but is not exclusive to musculoskeletal back pain, renal colic, urinary tract infection, pyelonephritis, intra-abdominal causes of back pain, aortic aneurysm or dissection, cauda equina syndrome, sciatica, lumbar disc disease, thoracic disc disease, etc.   Critical Interventions-    Medications  sodium chloride 0.9 % bolus 500 mL (has no administration in time range)  ondansetron (ZOFRAN) injection 4 mg (has no administration in time range)  morphine (PF) 4 MG/ML injection 4 mg (has no administration in time range)    Reassessment after intervention:     I decided to review pertinent External Data, and in summary patient with urology procedure with Dr. Milford Cage on 04/22/22.    Clinical Laboratory Tests Ordered, included  ***  Radiologic Tests Ordered, included ***. I independently interpreted the images and agree with radiology interpretation.   Cardiac Monitor Tracing which shows tachycardia.    Social Determinants of Health Risk patient is a non-smoker.   Consult complete with  Medical Decision Making: Summary:  Patient presents to the emergency department for evaluation of left flank pain which is returned and feels similar to prior stones.  Reports fever at home today but temp on arrival is normal.  Mild tachycardia.  Patient overall looks well but notes that pain is returning as were speaking.  Plan for repeat imaging as he often requires lithotripsy and stenting for his stones due to size.  His last dose of anticoagulant was yesterday. Last PO was 8AM today.   Reevaluation with update and discussion with   ***Considered admission***  Disposition:   ____________________________________________  FINAL CLINICAL IMPRESSION(S) / ED DIAGNOSES  Final diagnoses:  None     NEW OUTPATIENT MEDICATIONS STARTED DURING THIS VISIT:  New Prescriptions   No medications on file    Note:  This document was prepared using Dragon voice recognition software and may include unintentional dictation errors.  Nanda Quinton, MD, [facility] Emergency Medicine

## 2022-05-03 NOTE — Assessment & Plan Note (Signed)
-  reportedly had fever at home but afebrile here -UA with small leukocyte, negative nitrite and greater than 50 WBC.  -stone is not fully obstructed so urology not taking to OR until morning -continue IV Rocephin pending culture

## 2022-05-03 NOTE — H&P (Signed)
History and Physical    Patient: Joseph Pham BWI:[id number] DOB: [birth year] DOA: 05/03/2022 DOS: the patient was seen and examined on 05/03/2022 PCP: Angelina Sheriff, MD  Patient coming from: Home  Chief Complaint:  Chief Complaint  Patient presents with   Flank Pain   HPI: Joseph Pham is a 71 y.o. male with medical history significant of prostate cancer s/p brachytherapy, pulmonary embolism on Xarelto, hypertension, type 2 diabetes, OSA, history of nephrolithiasis who presents with fever at home and left flank pain.  Patient was found to have an obstructing infected left distal urethral calculus/multiple left urethral calculi on 04/09/2022 and underwent urgent stent placement.  He then subsequently had urethroscope laser lithotripsy and stone extraction of the left distal ureteral calculi and left JJ stent exchange on 04/22/2022 urology Dr. Milford Cage.  Started to have left flank pain with radiation to groin last evening with temperature up to 100.6 this morning at home with chills and nausea. No hematuria or changes in urine output.    In the ED, temperature 99.5 F, BP of 129/83 on room air.  WBC of 12, hemoglobin of 13, lactate of 2.3.  Creatinine is elevated at 2.23 from a prior of 1.96.  UA showing small leukocyte, negative nitrite and greater than 50 WBC.  CT renal stone search showing 6 mm distal left ureteral calculus with moderate left hydroureteronephrosis.  EDP discussed with urology Dr. Gilford Rile who commended patient will likely undergo stenting tomorrow and will see in consultation tomorrow.  Hospitalist then consulted for admission.  Review of Systems: As mentioned in the history of present illness. All other systems reviewed and are negative. 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   Overview:  43 mm ascending, 11/22/15   Bilateral hearing loss 07/19/2019   Cataract    forming bilaterally    Chest pain    Chest pain in adult 04/13/2018   Chronic tonsillitis 07/19/2019   CKD (chronic kidney disease), stage II 63/84/5364   Complication of anesthesia    low heart rate during lithotripsy procedure   Deviated septum 07/19/2019   Diabetes ([place]) 03/16/2017   Essential hypertension 10/14/2015   GERD (gastroesophageal reflux disease)    Hematuria    Hemorrhoid    History of kidney stones    Hyperlipidemia    Hypertension    Impingement of left ankle joint 03/06/2021   Kidney stone 03/16/2017   Lumbar radiculopathy 03/16/2017   Nasal turbinate hypertrophy 07/19/2019   OSA (obstructive sleep apnea)    OSA on CPAP    Paronychia 08/16/2015   PE (pulmonary thromboembolism) ([place]) 02/2019   Posterior tibialis tendinitis of both lower extremities 02/02/2020   Preoperative cardiovascular examination 08/19/2016   Pronation deformity of both feet 02/02/2020   Prostate cancer ([place]) 03/16/2017   Right flank pain    Right ureteral stone    Skin cancer    Sleep apnea    Tibialis posterior tendinitis 02/11/2021   Tinnitus, bilateral 08/31/2019   Type 2 diabetes, diet controlled ([place])    off Metformin > 1 yr now as of PV 02-23-18   Ureteral calculus, right 03/15/2012  UTI (urinary tract infection) 04/09/2022   Wears glasses    Past Surgical History:  Procedure Laterality Date   BACK SURGERY  2019   rod and screws    BILATERAL INGUINAL HERNIA REPAIR  1989   CARDIAC CATHETERIZATION  01-11-2004   NO EVIDENCE SIGNIFICANT EPICARDIAL FLOW LIMITING 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   CARPAL TUNNEL RELEASE Left 06/2016   CARPAL TUNNEL RELEASE Right 03/2017   COLONOSCOPY      last with medoff unsure when    [place], URETEROSCOPY AND STENT PLACEMENT Left 04/09/2022   Procedure: CYSTOSCOPY WITH RETROGRADE PYELOGRAM AND STENT PLACEMENT;  Surgeon: Lucas Mallow, MD;  Location: [facility];  Service: Urology;  Laterality: Left;   CYSTOSCOPY WITH URETEROSCOPY AND STENT PLACEMENT Right 10/28/2013   Procedure: RIGHT URETEROSCOPY AND STENT PLACEMENT;  Surgeon: [name], MD;  Location[facility];  Service: Urology;  Laterality: Right;   CYSTOSCOPY/URETEROSCOPY/HOLMIUM LASER/STENT PLACEMENT Left 09/06/2020   Procedure: CYSTOSCOPY/POSSIBLE URETEROSCOPY/HOLMIUM LASER/STENT PLACEMENT;  Surgeon: Remi Haggard, MD;  Location: [facility];  Service: Urology;  Laterality: Left;  30 MINS   CYSTOSCOPY/URETEROSCOPY/HOLMIUM LASER/STENT PLACEMENT Left 04/22/2022   Procedure: CYSTOSCOPY/URETEROSCOPY/HOLMIUM LASER/STENT REPLACEMENT;  Surgeon: Remi Haggard, MD;  Location: [facility];  Service: Urology;  Laterality: Left;  1 HR   EXTRACORPOREAL SHOCK WAVE LITHOTRIPSY  02-08-2009   HOLMIUM LASER APPLICATION Right 8/93/8101   Procedure: HOLMIUM LASER LITHO;  Surgeon: [name], MD;  Location: [facility];  Service: Urology;  Laterality: Right;   LEFT KNEE [place]   LEFT URETEROSCOPIC STONE EXTRACTION  02-23-2009   POLYPECTOMY  2014   1 polyp per dr Steve Rattler notes- no surg path to determine polyps type but was a 5 yr recall    RADIOACTIVE SEED IMPLANT  08/08/2011   Procedure: RADIOACTIVE SEED IMPLANT;  Surgeon: [name], MD;  Location: [facility];  Service: Urology;  Laterality: N/A;  seeds implanted 50 seeds found in bladder=none    REPLACEMENT TOTAL KNEE Left 2018   SKIN CANCER EXCISION     Burned off left side of nose, and head   TOTAL KNEE ARTHROPLASTY Right 08/2018   UPPER GASTROINTESTINAL ENDOSCOPY     URETEROSCOPY  03/15/2012   Procedure: URETEROSCOPY;  Surgeon: [name],  MD;  Location: [facility];  Service: Urology;  Laterality: Right;  Right Ueteroscopy    Social History:  reports that he quit smoking about 28 years ago. His smoking use included cigarettes. He has quit using smokeless tobacco.  His smokeless tobacco use included chew. He reports that he does not drink alcohol and does not use drugs.  Allergies  Allergen Reactions   Aspirin Anaphylaxis   Amoxicillin Itching    Family History  Problem Relation Age of Onset   Cancer Mother    Diabetes Father    CAD Father    Heart attack Father    Diabetes Sister    Colon polyps Sister    Colon polyps Brother    Colon cancer Neg Hx    Esophageal cancer Neg Hx    Rectal cancer Neg Hx    Stomach cancer Neg Hx     Prior to Admission medications   Medication Sig Start Date End Date Taking? Authorizing Provider  atorvastatin (LIPITOR) 10 MG tablet Take 10 mg by mouth at bedtime.    [provider]  Carboxymethylcellul-Glycerin (  REFRESH OPTIVE PF OP) Place 1 drop into both eyes 2 (two) times daily as needed (dry eyes).    [provider]  cholecalciferol (VITAMIN D3) 25 MCG (1000 UT) tablet Take 1,000 Units by mouth daily.    [provider]  famotidine (PEPCID) 40 MG tablet Take 40 mg by mouth at bedtime.     [provider]  fexofenadine (ALLEGRA) 180 MG tablet Take 180 mg by mouth daily.    [provider]  fluticasone (FLONASE) 50 MCG/ACT nasal spray Place 2 sprays into the nose daily as needed for allergies.     [provider]  lisinopril (PRINIVIL,ZESTRIL) 10 MG tablet Take 10 mg by mouth daily.     [provider]  Melatonin 5 MG CAPS Take 5 mg by mouth at bedtime.    [provider]  metFORMIN (GLUCOPHAGE) 500 MG tablet Take 500 mg by mouth at bedtime. 05/07/20   [provider]  omega-3 acid ethyl esters (LOVAZA) 1 g capsule Take 4 g by mouth at bedtime. 05/02/20   [provider]  omeprazole  (PRILOSEC) 20 MG capsule Take 20 mg by mouth daily.    [provider]  Rivaroxaban (XARELTO) 15 MG TABS tablet Take 1 tablet (15 mg total) by mouth daily. 04/15/21   Richardo Priest, MD  traMADol (ULTRAM) 50 MG tablet Take 50 mg by mouth 4 (four) times daily.    [provider]  traZODone (DESYREL) 100 MG tablet Take 50 mg by mouth at bedtime.    [provider]  zolpidem (AMBIEN CR) 12.5 MG CR tablet Take 1 tablet (12.5 mg total) by mouth at bedtime as needed for sleep. 02/27/22   Laurin Coder, MD    Physical Exam: Vitals:   05/03/22 1800 05/03/22 1806 05/03/22 1845 05/03/22 1945  BP: 139/86  120/64 125/71  Pulse: 85  74 71  Resp: '18  18 17  '$ Temp:  99.5 F (37.5 C)    TempSrc:  Oral    SpO2: 98%  98% 100%  Constitutional: NAD, calm, comfortable, obese male laying in bed Eyes:  lids and conjunctivae normal ENMT: Mucous membranes are moist. Neck: normal, supple Respiratory: clear to auscultation bilaterally, no wheezing, no crackles. Normal respiratory effort. No accessory muscle use.  Cardiovascular: Regular rate and rhythm, no murmurs / rubs / gallops. No extremity edema.  Abdomen: no tenderness, no CVA tenderness. Bowel sounds positive.  Musculoskeletal: no clubbing / cyanosis. No joint deformity upper and lower extremities.  Normal muscle tone.  Skin: no rashes, lesions, ulcers.  Neurologic: CN 2-12 grossly intact.  Strength 5/5 in all 4.  Psychiatric: Normal judgment and insight. Alert and oriented x 3. Normal mood. Data Reviewed:  See HPI  Assessment and Plan: * Nephrolithiasis -presented with left flank pain with CT renal stone search showing new 6 mm distal left ureteral calculus with moderate left hydroureteronephrosis. -recently had urgent stent placement on 04/09/2022 and subsequently had urethroscope laser lithotripsy and stone extraction of the left distal ureteral calculi and left JJ stent exchange on 04/22/2022 urology Dr.  Milford Cage. -urology to take for procedure tomorrow -NPO midnight   Lactic acidosis -has received fluids. Follow repeat.   AKI (acute kidney injury) ([place]) -secondary to new 6 mm distal left ureteral calculus with moderate left hydroureteronephrosis -urology to take to OR tomorrow  -has received 1L NS bolus in ED -monitor repeat creatinine following procedure -avoid nephrotoxic agent  UTI (urinary tract infection) -reportedly had fever at home  but afebrile here -UA with small leukocyte, negative nitrite and greater than 50 WBC.  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 Last dose yesterday (12/1) morning. Continue to hold for urology procedure tomorrow.  OSA (obstructive sleep apnea) -continue nightly CPAP  Diabetes ([place]) -Controlled. Last HbA1C of 6.2 on 04/18/22 -hold SSI while NPO overnight  Essential hypertension -Hold ACE-I due to AKI      Advance Care Planning:   Code Status: Full Code   Consults: urology  Family Communication: wife at bedside  Severity of Illness: The appropriate patient status for this patient is INPATIENT. Inpatient status is judged to be reasonable and necessary in order to provide the required intensity of service to ensure the patient's safety. The patient's presenting symptoms, physical exam findings, and initial radiographic and laboratory data in the context of their chronic comorbidities is felt to place them at high risk for further clinical deterioration. Furthermore, it is not anticipated that the patient will be medically stable for discharge from the hospital within 2 midnights of admission.   * I certify that at the point of admission it is my clinical judgment that the patient will require inpatient hospital care spanning beyond 2 midnights from the point of admission due to high intensity of  service, high risk for further deterioration and high frequency of surveillance required.*  Author: Orene Desanctis, DO 05/03/2022 10:00 PM  For on call review [email or web address].

## 2022-05-04 ENCOUNTER — Inpatient Hospital Stay (HOSPITAL_COMMUNITY): Payer: Medicare Other | Admitting: Anesthesiology

## 2022-05-04 ENCOUNTER — Encounter (HOSPITAL_COMMUNITY)
Admission: EM | Disposition: A | Discharge status: Home / Self Care | Payer: Self-pay | Source: Home / Self Care | Attending: Internal Medicine

## 2022-05-04 ENCOUNTER — Encounter (HOSPITAL_COMMUNITY): Payer: Self-pay | Admitting: Family Medicine

## 2022-05-04 ENCOUNTER — Inpatient Hospital Stay (HOSPITAL_COMMUNITY): Payer: Medicare Other

## 2022-05-04 DIAGNOSIS — N201 Calculus of ureter: Secondary | ICD-10-CM

## 2022-05-04 DIAGNOSIS — Z87891 Personal history of nicotine dependence: Secondary | ICD-10-CM | POA: Diagnosis not present

## 2022-05-04 DIAGNOSIS — N200 Calculus of kidney: Secondary | ICD-10-CM | POA: Diagnosis not present

## 2022-05-04 DIAGNOSIS — I10 Essential (primary) hypertension: Secondary | ICD-10-CM | POA: Diagnosis not present

## 2022-05-04 DIAGNOSIS — G4730 Sleep apnea, unspecified: Secondary | ICD-10-CM | POA: Diagnosis not present

## 2022-05-04 HISTORY — PX: CYSTOSCOPY WITH RETROGRADE PYELOGRAM, URETEROSCOPY AND STENT PLACEMENT: SHX5789

## 2022-05-04 LAB — BASIC METABOLIC PANEL
Anion gap: 9 (ref 5–15)
BUN: 25 mg/dL — ABNORMAL HIGH (ref 8–23)
CO2: 24 mmol/L (ref 22–32)
Calcium: 8.4 mg/dL — ABNORMAL LOW (ref 8.9–10.3)
Chloride: 103 mmol/L (ref 98–111)
Creatinine, Ser: 2.12 mg/dL — ABNORMAL HIGH (ref 0.61–1.24)
GFR, Estimated: 33 mL/min — ABNORMAL LOW (ref >60)
Glucose, Bld: 140 mg/dL — ABNORMAL HIGH (ref 70–99)
Potassium: 4.1 mmol/L (ref 3.5–5.1)
Sodium: 136 mmol/L (ref 135–145)

## 2022-05-04 LAB — URINE CULTURE: Culture: NO GROWTH

## 2022-05-04 LAB — CBC
HCT: 34.7 % — ABNORMAL LOW (ref 39.0–52.0)
Hemoglobin: 11.5 g/dL — ABNORMAL LOW (ref 13.0–17.0)
MCH: 29.8 pg (ref 26.0–34.0)
MCHC: 33.1 g/dL (ref 30.0–36.0)
MCV: 89.9 fL (ref 80.0–100.0)
Platelets: 180 10*3/uL (ref 150–400)
RBC: 3.86 MIL/uL — ABNORMAL LOW (ref 4.22–5.81)
RDW: 13.3 % (ref 11.5–15.5)
WBC: 9.7 10*3/uL (ref 4.0–10.5)
nRBC: 0 % (ref 0.0–0.2)

## 2022-05-04 LAB — GLUCOSE, CAPILLARY: Glucose-Capillary: 295 mg/dL — ABNORMAL HIGH (ref 70–99)

## 2022-05-04 SURGERY — CYSTOURETEROSCOPY, WITH RETROGRADE PYELOGRAM AND STENT INSERTION
Anesthesia: General | Site: Ureter | Laterality: Left

## 2022-05-04 SURGERY — FIXATION, FRACTURE, INTERTROCHANTERIC, WITH INTRAMEDULLARY ROD
Anesthesia: General | Laterality: Left

## 2022-05-04 MED ORDER — PROPOFOL 10 MG/ML IV BOLUS
INTRAVENOUS | Status: AC
  Filled 2022-05-04: qty 20

## 2022-05-04 MED ORDER — TRAZODONE HCL 50 MG PO TABS
50.0000 mg | ORAL_TABLET | Freq: Every day | ORAL | Status: DC
  Administered 2022-05-04: 50 mg via ORAL
  Filled 2022-05-04: qty 1

## 2022-05-04 MED ORDER — IOHEXOL 300 MG/ML  SOLN
INTRAMUSCULAR | Status: DC | PRN
  Administered 2022-05-04: 10 mL

## 2022-05-04 MED ORDER — INSULIN ASPART 100 UNIT/ML IJ SOLN
0.0000 [IU] | Freq: Three times a day (TID) | INTRAMUSCULAR | Status: DC
  Administered 2022-05-04: 1 [IU] via SUBCUTANEOUS
  Administered 2022-05-05 (×3): 2 [IU] via SUBCUTANEOUS

## 2022-05-04 MED ORDER — PANTOPRAZOLE SODIUM 40 MG PO TBEC
40.0000 mg | DELAYED_RELEASE_TABLET | Freq: Every day | ORAL | Status: DC
  Administered 2022-05-05: 40 mg via ORAL
  Filled 2022-05-04: qty 1

## 2022-05-04 MED ORDER — SODIUM CHLORIDE 0.9 % IV SOLN: INTRAVENOUS | Status: DC

## 2022-05-04 MED ORDER — LIDOCAINE 2% (20 MG/ML) 5 ML SYRINGE
INTRAMUSCULAR | Status: DC | PRN
  Administered 2022-05-04: 100 mg via INTRAVENOUS

## 2022-05-04 MED ORDER — PHENYLEPHRINE 80 MCG/ML (10ML) SYRINGE FOR IV PUSH (FOR BLOOD PRESSURE SUPPORT)
PREFILLED_SYRINGE | INTRAVENOUS | Status: AC
  Filled 2022-05-04: qty 10

## 2022-05-04 MED ORDER — INSULIN ASPART 100 UNIT/ML IJ SOLN
0.0000 [IU] | Freq: Every day | INTRAMUSCULAR | Status: DC
  Administered 2022-05-04: 3 [IU] via SUBCUTANEOUS

## 2022-05-04 MED ORDER — STERILE WATER FOR IRRIGATION IR SOLN
Status: DC | PRN
  Administered 2022-05-04: 3000 mL

## 2022-05-04 MED ORDER — PHENYLEPHRINE 80 MCG/ML (10ML) SYRINGE FOR IV PUSH (FOR BLOOD PRESSURE SUPPORT)
PREFILLED_SYRINGE | INTRAVENOUS | Status: DC | PRN
  Administered 2022-05-04: 160 ug via INTRAVENOUS
  Administered 2022-05-04 (×3): 80 ug via INTRAVENOUS
  Administered 2022-05-04: 160 ug via INTRAVENOUS

## 2022-05-04 MED ORDER — EPHEDRINE 5 MG/ML INJ
INTRAVENOUS | Status: AC
  Filled 2022-05-04: qty 5

## 2022-05-04 MED ORDER — LACTATED RINGERS IV SOLN: INTRAVENOUS | Status: DC | PRN

## 2022-05-04 MED ORDER — DEXAMETHASONE SODIUM PHOSPHATE 10 MG/ML IJ SOLN
INTRAMUSCULAR | Status: AC
  Filled 2022-05-04: qty 1

## 2022-05-04 MED ORDER — FENTANYL CITRATE (PF) 100 MCG/2ML IJ SOLN
INTRAMUSCULAR | Status: AC
  Filled 2022-05-04: qty 2

## 2022-05-04 MED ORDER — ONDANSETRON HCL 4 MG/2ML IJ SOLN
INTRAMUSCULAR | Status: AC
  Filled 2022-05-04: qty 2

## 2022-05-04 MED ORDER — EPHEDRINE SULFATE-NACL 50-0.9 MG/10ML-% IV SOSY
PREFILLED_SYRINGE | INTRAVENOUS | Status: DC | PRN
  Administered 2022-05-04 (×3): 5 mg via INTRAVENOUS
  Administered 2022-05-04: 10 mg via INTRAVENOUS

## 2022-05-04 MED ORDER — DEXAMETHASONE SODIUM PHOSPHATE 10 MG/ML IJ SOLN
INTRAMUSCULAR | Status: DC | PRN
  Administered 2022-05-04: 10 mg via INTRAVENOUS

## 2022-05-04 MED ORDER — OXYCODONE HCL 5 MG PO TABS: 5.0000 mg | ORAL_TABLET | Freq: Once | ORAL | Status: DC | PRN

## 2022-05-04 MED ORDER — PROPOFOL 10 MG/ML IV BOLUS
INTRAVENOUS | Status: DC | PRN
  Administered 2022-05-04: 200 mg via INTRAVENOUS

## 2022-05-04 MED ORDER — FENTANYL CITRATE PF 50 MCG/ML IJ SOSY: 25.0000 ug | PREFILLED_SYRINGE | INTRAMUSCULAR | Status: DC | PRN

## 2022-05-04 MED ORDER — ACETAMINOPHEN 10 MG/ML IV SOLN: 1000.0000 mg | Freq: Once | INTRAVENOUS | Status: DC | PRN

## 2022-05-04 MED ORDER — ONDANSETRON HCL 4 MG/2ML IJ SOLN
INTRAMUSCULAR | Status: DC | PRN
  Administered 2022-05-04: 4 mg via INTRAVENOUS

## 2022-05-04 MED ORDER — LIDOCAINE HCL (PF) 2 % IJ SOLN
INTRAMUSCULAR | Status: AC
  Filled 2022-05-04: qty 5

## 2022-05-04 MED ORDER — ONDANSETRON HCL 4 MG/2ML IJ SOLN: 4.0000 mg | Freq: Once | INTRAMUSCULAR | Status: DC | PRN

## 2022-05-04 MED ORDER — FENTANYL CITRATE (PF) 100 MCG/2ML IJ SOLN
INTRAMUSCULAR | Status: DC | PRN
  Administered 2022-05-04: 100 ug via INTRAVENOUS

## 2022-05-04 MED ORDER — OXYCODONE HCL 5 MG/5ML PO SOLN: 5.0000 mg | Freq: Once | ORAL | Status: DC | PRN

## 2022-05-04 SURGICAL SUPPLY — 20 items
BAG URO CATCHER STRL LF (MISCELLANEOUS) ×1 IMPLANT
BASKET ZERO TIP NITINOL 2.4FR (BASKET) IMPLANT
CATH URETL OPEN 5X70 (CATHETERS) ×1 IMPLANT
CLOTH BEACON ORANGE TIMEOUT ST (SAFETY) ×1 IMPLANT
EXTRACTOR STONE NITINOL NGAGE (UROLOGICAL SUPPLIES) IMPLANT
GLOVE SURG LX STRL 7.5 STRW (GLOVE) ×1 IMPLANT
GOWN SRG XL LVL 4 BRTHBL STRL (GOWNS) ×1 IMPLANT
GOWN STRL NON-REIN XL LVL4 (GOWNS) ×1
GUIDEWIRE STR DUAL SENSOR (WIRE) IMPLANT
GUIDEWIRE ZIPWRE .038 STRAIGHT (WIRE) ×1 IMPLANT
KIT TURNOVER KIT A (KITS) ×1 IMPLANT
LASER FIB FLEXIVA PULSE ID 365 (Laser) IMPLANT
MANIFOLD NEPTUNE II (INSTRUMENTS) ×1 IMPLANT
PACK CYSTO (CUSTOM PROCEDURE TRAY) ×1 IMPLANT
SHEATH NAVIGATOR HD 11/13X36 (SHEATH) IMPLANT
STENT URET 6FRX26 CONTOUR (STENTS) IMPLANT
TRACTIP FLEXIVA PULS ID 200XHI (Laser) IMPLANT
TRACTIP FLEXIVA PULSE ID 200 (Laser)
TUBING CONNECTING 10 (TUBING) ×1 IMPLANT
TUBING UROLOGY SET (TUBING) ×1 IMPLANT

## 2022-05-04 SURGICAL SUPPLY — 21 items
BAG URO CATCHER STRL LF (MISCELLANEOUS) ×1 IMPLANT
BASKET ZERO TIP NITINOL 2.4FR (BASKET) IMPLANT
CATH URETL OPEN 5X70 (CATHETERS) ×1 IMPLANT
CLOTH BEACON ORANGE TIMEOUT ST (SAFETY) ×1 IMPLANT
EXTRACTOR STONE NITINOL NGAGE (UROLOGICAL SUPPLIES) IMPLANT
GLOVE SURG LX STRL 7.5 STRW (GLOVE) ×1 IMPLANT
GOWN SRG XL LVL 4 BRTHBL STRL (GOWNS) ×1 IMPLANT
GOWN STRL NON-REIN XL LVL4 (GOWNS) ×1
GUIDEWIRE STR DUAL SENSOR (WIRE) IMPLANT
GUIDEWIRE ZIPWRE .038 STRAIGHT (WIRE) ×1 IMPLANT
KIT TURNOVER KIT A (KITS) ×1 IMPLANT
LASER FIB FLEXIVA PULSE ID 365 (Laser) IMPLANT
MANIFOLD NEPTUNE II (INSTRUMENTS) ×1 IMPLANT
PACK CYSTO (CUSTOM PROCEDURE TRAY) ×1 IMPLANT
SHEATH NAVIGATOR HD 11/13X36 (SHEATH) IMPLANT
STENT URET 6FRX24 CONTOUR (STENTS) IMPLANT
STENT URET 6FRX26 CONTOUR (STENTS) IMPLANT
TRACTIP FLEXIVA PULS ID 200XHI (Laser) IMPLANT
TRACTIP FLEXIVA PULSE ID 200 (Laser)
TUBING CONNECTING 10 (TUBING) ×1 IMPLANT
TUBING UROLOGY SET (TUBING) ×1 IMPLANT

## 2022-05-04 NOTE — Anesthesia Preprocedure Evaluation (Signed)
Anesthesia Evaluation  Patient identified by MRN, date of birth, ID band Patient awake    Reviewed: Allergy & Precautions, H&P , NPO status , Patient's Chart, lab work & pertinent test results  Airway Mallampati: II  TM Distance: >3 FB Neck ROM: Full    Dental no notable dental hx.    Pulmonary sleep apnea , former smoker   Pulmonary exam normal breath sounds clear to auscultation       Cardiovascular hypertension, + Peripheral Vascular Disease  Normal cardiovascular exam Rhythm:Regular Rate:Normal     Neuro/Psych negative neurological ROS  negative psych ROS   GI/Hepatic negative GI ROS, Neg liver ROS,,,  Endo/Other  diabetes    Renal/GU Renal InsufficiencyRenal disease  negative genitourinary   Musculoskeletal negative musculoskeletal ROS (+)    Abdominal   Peds negative pediatric ROS (+)  Hematology negative hematology ROS (+)   Anesthesia Other Findings   Reproductive/Obstetrics negative OB ROS                             Anesthesia Physical Anesthesia Plan  ASA: 3 and emergent  Anesthesia Plan: General   Post-op Pain Management: Minimal or no pain anticipated   Induction: Intravenous  PONV Risk Score and Plan: 2 and Ondansetron, Dexamethasone and Treatment may vary due to age or medical condition  Airway Management Planned: LMA  Additional Equipment:   Intra-op Plan:   Post-operative Plan: Extubation in OR  Informed Consent: I have reviewed the patients History and Physical, chart, labs and discussed the procedure including the risks, benefits and alternatives for the proposed anesthesia with the patient or authorized representative who has indicated his/her understanding and acceptance.     Dental advisory given  Plan Discussed with: CRNA and Surgeon  Anesthesia Plan Comments:        Anesthesia Quick Evaluation

## 2022-05-04 NOTE — Progress Notes (Signed)
PROGRESS NOTE  Joseph Pham  DOB: 1950-12-10  PCP: Angelina Sheriff, MD ZOX:[id number]  DOA: 05/03/2022  LOS: 1 day  Hospital Day: 2  Brief narrative: Joseph Pham is a 71 y.o. male with PMH significant for prostate cancer s/p brachytherapy, PE on Xarelto, DM2, HTN, OSA, AAA, CKD, GERD, history of nephrolithiasis. Patient presented to the ED on 12/2 with complaint of fever, left flank pain. 11/8, patient underwent urgent stent placement for an obstructing infected left distal ureteral calculus/multiple left ureteral calculi. 11/21, underwent laser lithotripsy and stone extraction and left JJ stent exchange by Dr. Milford Cage. Patient did fine after the procedure until 12/1 when he started having left flank pain radiating to the groin and a fever of 100.6 and hence came to the ED.  In the ED, temperature was 98.5, hemodynamically stable WBC count 12, lactic acid elevated 2.3, creatinine elevated 2.23 from a prior of 1.96 Urinalysis showed small leukocyte, negative nitrate and greater than 50 WBC CT renal study showed 6 mm distal left ureteral calculus with moderate left hydroureteronephrosis.. EDP discussed with urology Dr. Gilford Rile who recommended hospitalization with a tentative plan of stenting.  Admitted to [facility] service.  Subjective: Patient was seen and examined this afternoon.  Pleasant elderly Caucasian male.  Lying on bed.  Pain controlled. Chart reviewed.  Afebrile, hemodynamically stable, breathing on room air Labs this morning with WBC count improved to 9.7, creatinine improving to 2.12  Assessment and plan: Left ureteral calculus with hydronephrosis History of nephrolithiasis s/p stent recently Urology consulted.   This morning, patient underwent left retrograde pyelogram.  It revealed a filling defect within the distal aspect of the left ureter, consistent with the obstructing stone seen on recent CT.  No other filling defects were seen within the ureter.  The  left renal pelvis was not completely opacified due to the degree of obstruction. Large amount of purulent fluid was expressed from the left ureter following wire manipulation of the obstructing stone   UTI Reported fever at home.  WBC count and lactic acid level were elevated on admission Urinalysis was not clearly indicated of infection.  However during the pyelogram, large amount of purulent urine was expressed from the left ureter.  Currently on empiric coverage with IV Rocephin.   WBC and lactic acid level improved. Pending urine culture report.  Continue to monitor Recent Labs  Lab 05/03/22 1810 05/03/22 2153 05/04/22 0408  WBC 12.0*  --  9.7  LATICACIDVEN 2.3* 1.4  --    AKI on CKD 3B Baseline creatinine 1.72 from 11/9.  Presented with creatinine elevated to 2.23 due to obstruction.  Gradually improving on IV fluid.  Continue to monitor Recent Labs    04/09/22 1607 04/10/22 0431 04/11/22 1803 05/03/22 1810 05/04/22 0408  BUN 26* 26* 35* 24* 25*  CREATININE 2.32* 1.72* 1.96* 2.23* 2.12*   Type 2 diabetes mellitus A1c 6.2 on 04/18/2022 PTA on metformin 500 mg at bedtime.  Creatinine seems elevated at baseline.  I would stop metformin permanently. Currently on sliding scale insulin with Accu-Cheks No results for input(s): "GLUCAP" in the last 168 hours.  Unprovoked PE 2020 Last dose on 12/1.  Currently on hold.   OSA (obstructive sleep apnea) continue nightly CPAP    Essential hypertension PTA on lisinopril 10 mg daily Currently on hold  HLD Continue statin   Goals of care   Code Status: Full Code    Mobility: Encourage ambulation.  Infusions:   sodium chloride  cefTRIAXone (ROCEPHIN)  IV      Scheduled Meds:  insulin aspart  0-5 Units Subcutaneous QHS   insulin aspart  0-9 Units Subcutaneous TID WC   pantoprazole  40 mg Oral Daily   traZODone  50 mg Oral QHS    PRN meds: morphine injection, ondansetron (ZOFRAN) IV   Skin assessment:      Nutritional status:  There is no height or weight on file to calculate BMI.          Diet:  Diet Order             Diet regular Room service appropriate? Yes; Fluid consistency: Thin  Diet effective now                   DVT prophylaxis:  SCDs Start: 05/03/22 2152   Antimicrobials: IV Rocephin Fluid: Continue NS at 41 mill per hour Consultants: Urologist Family Communication: Wife at bedside  Status is: Inpatient  Continue in-hospital care because: Pending urine culture report Level of care: Med-Surg   Dispo: The patient is from: Home              Anticipated d/c is to: Hopefully home in 1 to 2 days              Patient currently is not medically stable to d/c.   Difficult to place patient No       Antimicrobials: Anti-infectives (From admission, onward)    Start     Dose/Rate Route Frequency Ordered Stop   05/04/22 2000  cefTRIAXone (ROCEPHIN) 1 g in sodium chloride 0.9 % 100 mL IVPB        1 g 200 mL/hr over 30 Minutes Intravenous Every 24 hours 05/03/22 2151     05/03/22 2045  cefTRIAXone (ROCEPHIN) 1 g in sodium chloride 0.9 % 100 mL IVPB        1 g 200 mL/hr over 30 Minutes Intravenous  Once 05/03/22 2036 05/03/22 2214       Objective: Vitals:   05/04/22 1232 05/04/22 1351  BP: (!) 113/52 120/67  Pulse: 68 83  Resp: 16 14  Temp: 98.1 F (36.7 C) 98 F (36.7 C)  SpO2: 96% 95%    Intake/Output Summary (Last 24 hours) at 05/04/2022 1524 Last data filed at 05/04/2022 1400 Gross per 24 hour  Intake 400 ml  Output 550 ml  Net -150 ml   There were no vitals filed for this visit. Weight change:  There is no height or weight on file to calculate BMI.   Physical Exam: General exam: Pleasant, elderly Caucasian male.  Not in physical distress Skin: No rashes, lesions or ulcers. HEENT: Atraumatic, normocephalic, no obvious bleeding Lungs: Clear to auscultation bilaterally CVS: Regular rate and rhythm, no murmur GI/Abd soft, nontender,  distended from obesity, bowel sound present CNS: Alert, awake, oriented x 3 Psychiatry: Mood appropriate Extremities: No pedal edema, no calf tenderness  Data Review: I have personally reviewed the laboratory data and studies available.  F/u labs ordered Unresulted Labs (From admission, onward)     Start     Ordered   05/04/22 0957  Urine Culture  RELEASE UPON ORDERING,   TIMED       Comments: Specimen A: Phone [phone]         Previous Biopsy:  no Is the patient on airborne/droplet precautions? No           Clinical History:  N/A Copy of Report to:  N/A Specimen Disposition:  Delivered to Histology     05/04/22 0957   05/03/22 1751  Urine Culture  ONCE - URGENT,   URGENT       Question:  Indication  Answer:  Flank Pain   05/03/22 1752            Signed, Terrilee Croak, MD [facility] 05/04/2022

## 2022-05-04 NOTE — Plan of Care (Signed)
Discuss and review plan of care with patient/family  

## 2022-05-04 NOTE — Consult Note (Signed)
Urology Consult   Physician requesting consult: Ileene Musa, MD   Reason for consult: Left renal colic and ureteral stone  History of Present Illness: Joseph Pham is a 71 y.o.-year-old male who recently underwent left ureteroscopy with Dr. Milford Cage due to an obstructing distal ureteral stone as well as a nonobstructing left renal stone.  The patient had his ureteral stent removed last week in the office and shortly thereafter started to develop worsening left-sided flank pain associated with general malaise and temperatures of 99 F to 101 F.  He was evaluated in the emergency department last night and found to be afebrile and hemodynamically stable.  Repeat CT scan revealed an obstructing 6 mm left distal ureteral stone associated with moderate hydronephrosis. Is a history of CKD with a baseline creatinine of approximately 1.8.  His creatinine was found to be 2.2 yesterday and 2.12 today.  He denies any significant emesis, dysuria or hematuria.  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   Overview:  43 mm ascending, 11/22/15   Bilateral hearing loss 07/19/2019   Cataract    forming bilaterally    Chest pain    Chest pain in adult 04/13/2018   Chronic tonsillitis 07/19/2019   CKD (chronic kidney disease), stage II 62/13/0865   Complication of anesthesia    low heart rate during lithotripsy procedure   Deviated septum 07/19/2019   Diabetes ([place]) 03/16/2017   Essential hypertension 10/14/2015   GERD (gastroesophageal reflux disease)    Hematuria    Hemorrhoid    History of kidney stones    Hyperlipidemia    Hypertension    Impingement of left ankle  joint 03/06/2021   Kidney stone 03/16/2017   Lumbar radiculopathy 03/16/2017   Nasal turbinate hypertrophy 07/19/2019   OSA (obstructive sleep apnea)    OSA on CPAP    Paronychia 08/16/2015   PE (pulmonary thromboembolism) ([place]) 02/2019   Posterior tibialis tendinitis of both lower extremities 02/02/2020   Preoperative cardiovascular examination 08/19/2016   Pronation deformity of both feet 02/02/2020   Prostate cancer ([place]) 03/16/2017   Right flank pain    Right ureteral stone    Skin cancer    Sleep apnea    Tibialis posterior tendinitis 02/11/2021   Tinnitus, bilateral 08/31/2019   Type 2 diabetes, diet controlled ([place])    off Metformin > 1 yr now as of PV 02-23-18   Ureteral calculus, right 03/15/2012   UTI (urinary tract infection) 04/09/2022   Wears glasses     Past Surgical History:  Procedure Laterality Date   BACK SURGERY  2019   rod and screws    BILATERAL INGUINAL HERNIA REPAIR  1989   CARDIAC CATHETERIZATION  01-11-2004   NO EVIDENCE SIGNIFICANT EPICARDIAL FLOW LIMITING 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   CARPAL TUNNEL RELEASE Left 06/2016   CARPAL TUNNEL RELEASE Right 03/2017   COLONOSCOPY     last with medoff unsure when    [place], URETEROSCOPY AND STENT PLACEMENT Left 04/09/2022   Procedure: CYSTOSCOPY WITH RETROGRADE PYELOGRAM AND STENT PLACEMENT;  Surgeon: Lucas Mallow, MD;  Location: [facility];  Service: Urology;  Laterality: Left;   CYSTOSCOPY WITH URETEROSCOPY AND STENT PLACEMENT Right 10/28/2013   Procedure: RIGHT URETEROSCOPY AND STENT PLACEMENT;  Surgeon: [name], MD;  Location: [facility];  Service: Urology;  Laterality: Right;   CYSTOSCOPY/URETEROSCOPY/HOLMIUM LASER/STENT PLACEMENT Left 09/06/2020   Procedure: CYSTOSCOPY/POSSIBLE URETEROSCOPY/HOLMIUM LASER/STENT PLACEMENT;  Surgeon: Remi Haggard, MD;  Location: [facility];  Service: Urology;  Laterality: Left;  30 MINS    CYSTOSCOPY/URETEROSCOPY/HOLMIUM LASER/STENT PLACEMENT Left 04/22/2022   Procedure: CYSTOSCOPY/URETEROSCOPY/HOLMIUM LASER/STENT REPLACEMENT;  Surgeon: Remi Haggard, MD;  Location: [facility];  Service: Urology;  Laterality: Left;  1 HR   EXTRACORPOREAL SHOCK WAVE LITHOTRIPSY  02-08-2009   HOLMIUM LASER APPLICATION Right 9/48/5462   Procedure: HOLMIUM LASER LITHO;  Surgeon: [name], MD;  Location: [facility];  Service: Urology;  Laterality: Right;   LEFT KNEE [place]   LEFT URETEROSCOPIC STONE EXTRACTION  02-23-2009   POLYPECTOMY  2014   1 polyp per dr Steve Rattler notes- no surg path to determine polyps type but was a 5 yr recall    RADIOACTIVE SEED IMPLANT  08/08/2011   Procedure: RADIOACTIVE SEED IMPLANT;  Surgeon: [name], MD;  Location: [facility];  Service: Urology;  Laterality: N/A;  seeds implanted 50 seeds found in bladder=none    REPLACEMENT TOTAL KNEE Left 2018   SKIN CANCER EXCISION     Burned off left side of nose, and head   TOTAL KNEE ARTHROPLASTY Right 08/2018   UPPER GASTROINTESTINAL ENDOSCOPY     URETEROSCOPY  03/15/2012   Procedure: URETEROSCOPY;  Surgeon: [name], MD;  Location: [facility];  Service: Urology;  Laterality: Right;  Right Ueteroscopy     Current Hospital Medications:  Home Meds:  Current Meds  Medication Sig   atorvastatin (LIPITOR) 10 MG tablet Take 10 mg by mouth at bedtime.   Carboxymethylcellul-Glycerin (REFRESH OPTIVE PF OP) Place 1 drop into both eyes 2 (two) times daily as needed (dry eyes).   cholecalciferol (VITAMIN D3) 25 MCG (1000 UT) tablet Take 1,000 Units by mouth daily.   ciprofloxacin (CIPRO) 250 MG tablet Take 250 mg by mouth 2 (two) times daily.   famotidine (PEPCID) 40 MG tablet Take 40 mg by mouth at bedtime.    fexofenadine (ALLEGRA) 180 MG tablet Take 180 mg by mouth daily.   fluticasone (FLONASE) 50 MCG/ACT nasal spray Place 2 sprays into the nose  daily as needed for allergies.    lisinopril (PRINIVIL,ZESTRIL) 10 MG tablet Take 10 mg by mouth daily.    Melatonin 5 MG CAPS Take 5 mg by mouth at bedtime.   metFORMIN (GLUCOPHAGE) 500 MG tablet Take 500 mg by mouth at bedtime.   omega-3 acid ethyl esters (LOVAZA) 1 g capsule Take 4 g by mouth at bedtime.   omeprazole (PRILOSEC) 20 MG capsule Take 20 mg by mouth daily.   Rivaroxaban (XARELTO) 15 MG TABS tablet Take 1 tablet (15 mg total) by mouth daily.   traMADol (ULTRAM) 50 MG tablet Take 50 mg by mouth 2 (two) times daily.   traZODone (DESYREL) 100 MG tablet Take 50 mg by mouth at bedtime.    Scheduled Meds:  [MAR Hold] pantoprazole  40 mg Oral Daily   [MAR Hold] traZODone  50 mg Oral QHS   Continuous Infusions:  [MAR Hold] cefTRIAXone (ROCEPHIN)  IV     PRN Meds:.[MAR Hold]  morphine injection, [MAR Hold] ondansetron (ZOFRAN)  IV  Allergies:  Allergies  Allergen Reactions   Aspirin Anaphylaxis   Amoxicillin Itching    Family History  Problem Relation Age of Onset   Cancer Mother    Diabetes Father    CAD Father    Heart attack Father    Diabetes Sister    Colon polyps Sister    Colon polyps Brother    Colon cancer Neg Hx    Esophageal cancer Neg Hx    Rectal cancer Neg Hx    Stomach cancer Neg Hx     Social History:  reports that he quit smoking about 28 years ago. His smoking use included cigarettes. He has quit using smokeless tobacco.  His smokeless tobacco use included chew. He reports that he does not drink alcohol and does not use drugs.  ROS: A complete review of systems was performed.  All systems are negative except for pertinent findings as noted.  Physical Exam:  Vital signs in last 24 hours: Temp:  [98.3 F (36.8 C)-99.5 F (37.5 C)] 99.2 F (37.3 C) (12/03 0516) Pulse Rate:  [68-100] 71 (12/03 0516) Resp:  [16-20] 17 (12/03 0516) BP: (96-139)/(60-86) 114/64 (12/03 0516) SpO2:  [94 %-100 %] 95 % (12/03 0516) Constitutional:  Alert and  oriented, No acute distress Cardiovascular: Regular rate and rhythm, No JVD Respiratory: Normal respiratory effort, Lungs clear bilaterally GI: Abdomen is soft, nontender, nondistended, no abdominal masses GU: No CVA tenderness Lymphatic: No lymphadenopathy Neurologic: Grossly intact, no focal deficits Psychiatric: Normal mood and affect  Laboratory Data:  Recent Labs    05/03/22 1810 05/04/22 0408  WBC 12.0* 9.7  HGB 13.0 11.5*  HCT 39.3 34.7*  PLT 206 180    Recent Labs    05/03/22 1810 05/04/22 0408  NA 136 136  K 4.1 4.1  CL 100 103  GLUCOSE 141* 140*  BUN 24* 25*  CALCIUM 8.9 8.4*  CREATININE 2.23* 2.12*     Results for orders placed or performed during the hospital encounter of 05/03/22 (from the past 24 hour(s))  Comprehensive metabolic panel     Status: Abnormal   Collection Time: 05/03/22  6:10 PM  Result Value Ref Range   Sodium 136 135 - 145 mmol/L   Potassium 4.1 3.5 - 5.1 mmol/L   Chloride 100 98 - 111 mmol/L   CO2 25 22 - 32 mmol/L   Glucose, Bld 141 (H) 70 - 99 mg/dL   BUN 24 (H) 8 - 23 mg/dL   Creatinine, Ser 2.23 (H) 0.61 - 1.24 mg/dL   Calcium 8.9 8.9 - 10.3 mg/dL   Total Protein 8.2 (H) 6.5 - 8.1 g/dL   Albumin 3.4 (L) 3.5 - 5.0 g/dL   AST 17 15 - 41 U/L   ALT 15 0 - 44 U/L   Alkaline Phosphatase 45 38 - 126 U/L   Total Bilirubin 0.9 0.3 - 1.2 mg/dL   GFR, Estimated 31 (L) >60 mL/min   Anion gap 11 5 - 15  CBC with Differential     Status: Abnormal   Collection Time: 05/03/22  6:10 PM  Result Value Ref Range   WBC 12.0 (H) 4.0 - 10.5 K/uL   RBC 4.39 4.22 - 5.81 MIL/uL   Hemoglobin 13.0 13.0 - 17.0 g/dL   HCT 39.3 39.0 - 52.0 %   MCV 89.5 80.0 - 100.0 fL   MCH 29.6 26.0 - 34.0 pg   MCHC 33.1 30.0 - 36.0 g/dL   RDW 13.2 11.5 - 15.5 %  Platelets 206 150 - 400 K/uL   nRBC 0.0 0.0 - 0.2 %   Neutrophils Relative % 76 %   Neutro Abs 9.0 (H) 1.7 - 7.7 K/uL   Lymphocytes Relative 12 %   Lymphs Abs 1.5 0.7 - 4.0 K/uL   Monocytes  Relative 11 %   Monocytes Absolute 1.4 (H) 0.1 - 1.0 K/uL   Eosinophils Relative 0 %   Eosinophils Absolute 0.0 0.0 - 0.5 K/uL   Basophils Relative 0 %   Basophils Absolute 0.0 0.0 - 0.1 K/uL   Immature Granulocytes 1 %   Abs Immature Granulocytes 0.06 0.00 - 0.07 K/uL  Lactic acid, plasma     Status: Abnormal   Collection Time: 05/03/22  6:10 PM  Result Value Ref Range   Lactic Acid, Venous 2.3 (HH) 0.5 - 1.9 mmol/L  Urinalysis, Routine w reflex microscopic     Status: Abnormal   Collection Time: 05/03/22  6:45 PM  Result Value Ref Range   Color, Urine YELLOW YELLOW   APPearance HAZY (A) CLEAR   Specific Gravity, Urine 1.020 1.005 - 1.030   pH 6.0 5.0 - 8.0   Glucose, UA NEGATIVE NEGATIVE mg/dL   Hgb urine dipstick MODERATE (A) NEGATIVE   Bilirubin Urine NEGATIVE NEGATIVE   Ketones, ur NEGATIVE NEGATIVE mg/dL   Protein, ur NEGATIVE NEGATIVE mg/dL   Nitrite NEGATIVE NEGATIVE   Leukocytes,Ua SMALL (A) NEGATIVE   RBC / HPF 11-20 0 - 5 RBC/hpf   WBC, UA >50 (H) 0 - 5 WBC/hpf   Bacteria, UA NONE SEEN NONE SEEN   Mucus PRESENT   Lactic acid, plasma     Status: None   Collection Time: 05/03/22  9:53 PM  Result Value Ref Range   Lactic Acid, Venous 1.4 0.5 - 1.9 mmol/L  CBC     Status: Abnormal   Collection Time: 05/04/22  4:08 AM  Result Value Ref Range   WBC 9.7 4.0 - 10.5 K/uL   RBC 3.86 (L) 4.22 - 5.81 MIL/uL   Hemoglobin 11.5 (L) 13.0 - 17.0 g/dL   HCT 34.7 (L) 39.0 - 52.0 %   MCV 89.9 80.0 - 100.0 fL   MCH 29.8 26.0 - 34.0 pg   MCHC 33.1 30.0 - 36.0 g/dL   RDW 13.3 11.5 - 15.5 %   Platelets 180 150 - 400 K/uL   nRBC 0.0 0.0 - 0.2 %  Basic metabolic panel     Status: Abnormal   Collection Time: 05/04/22  4:08 AM  Result Value Ref Range   Sodium 136 135 - 145 mmol/L   Potassium 4.1 3.5 - 5.1 mmol/L   Chloride 103 98 - 111 mmol/L   CO2 24 22 - 32 mmol/L   Glucose, Bld 140 (H) 70 - 99 mg/dL   BUN 25 (H) 8 - 23 mg/dL   Creatinine, Ser 2.12 (H) 0.61 - 1.24 mg/dL    Calcium 8.4 (L) 8.9 - 10.3 mg/dL   GFR, Estimated 33 (L) >60 mL/min   Anion gap 9 5 - 15   No results found for this or any previous visit (from the past 240 hour(s)).  Renal Function: Recent Labs    05/03/22 1810 05/04/22 0408  CREATININE 2.23* 2.12*   Estimated Creatinine Clearance: 37.6 mL/min (A) (by C-G formula based on SCr of 2.12 mg/dL (H)).  Radiologic Imaging: CT Renal Stone Study  Result Date: 05/03/2022 CLINICAL DATA:  71 year old male with acute abdominal, flank and pelvic pain. History of obstructing distal LEFT  ureteral calculus which was removed on 04/22/2022. History of prostate cancer EXAM: CT ABDOMEN AND PELVIS WITHOUT CONTRAST TECHNIQUE: Multidetector CT imaging of the abdomen and pelvis was performed following the standard protocol without IV contrast. RADIATION DOSE REDUCTION: This exam was performed according to the departmental dose-optimization program which includes automated exposure control, adjustment of the mA and/or kV according to patient size and/or use of iterative reconstruction technique. COMPARISON:  04/09/2022 CT FINDINGS: Please note that parenchymal and vascular abnormalities may be missed as intravenous contrast was not administered. Lower chest: No acute abnormality. Hepatobiliary: Hepatic steatosis again noted without focal hepatic abnormality. The gallbladder is unremarkable. There is no evidence of intrahepatic or extrahepatic biliary dilatation. Pancreas: Unremarkable Spleen: Unremarkable Adrenals/Urinary Tract: A 6 mm distal LEFT ureteral calculus (1 cm above the UVJ) causes moderate LEFT hydroureteronephrosis. Nonobstructing bilateral renal calculi are again identified, punctate calculus within RIGHT UPPER pole and several LEFT renal calculi, the largest measuring 14 mm. There is no evidence of obstructing RIGHT urinary calculus. The bladder and adrenal glands are unremarkable. Stomach/Bowel: Stomach is within normal limits. No evidence of bowel wall  thickening, distention, or inflammatory changes. Vascular/Lymphatic: Aortic atherosclerosis. No enlarged abdominal or pelvic lymph nodes. Reproductive: Prostate seeds are again noted. Other: No ascites, focal collection or pneumoperitoneum. A small to moderate umbilical hernia containing fat is again noted. Musculoskeletal: No acute or suspicious bony abnormalities are noted. Posterior/interbody fusion changes at L4-5 again noted. IMPRESSION: 1. 6 mm distal LEFT ureteral calculus (1 cm above the UVJ) causing moderate LEFT hydroureteronephrosis. 2. Bilateral nephrolithiasis 3. Hepatic steatosis. 4. Small to moderate umbilical hernia containing fat. 5.  Aortic Atherosclerosis (ICD10-I70.0). Electronically Signed   By: Margarette Canada M.D.   On: 05/03/2022 19:08    I independently reviewed the above imaging studies.  Impression/Recommendation 71 year old male with an obstructing 6 mm left distal ureteral stone with low-grade fever and acute on chronic renal failure.  The risks, benefits and alternatives of cystoscopy with left ureteroscopy, laser lithotripsy and ureteral stent placement was discussed the patient.  Risks included, but are not limited to: bleeding, urinary tract infection, ureteral injury/avulsion, ureteral stricture formation, retained stone fragments, the possibility that multiple surgeries may be required to treat the stone(s), MI, stroke, PE and the inherent risks of general anesthesia.  The patient voices understanding and wishes to proceed.     Ellison Hughs, MD [facility] 05/04/2022, 8:51 AM

## 2022-05-04 NOTE — Transfer of Care (Signed)
Immediate Anesthesia Transfer of Care Note  Patient: Joseph Pham  Procedure(s) Performed: CYSTOSCOPY WITH RETROGRADE PYELOGRAM, URETEROSCOPY AND STENT PLACEMENT (Left: Ureter)  Patient Location: PACU  Anesthesia Type:General  Level of Consciousness: awake and patient cooperative  Airway & Oxygen Therapy: Patient Spontanous Breathing and Patient connected to face mask oxygen  Post-op Assessment: Report given to RN and Post -op Vital signs reviewed and stable  Post vital signs: Reviewed and stable  Last Vitals:  Vitals Value Taken Time  BP 101/63 05/04/22 1125  Temp 36.8 C 05/04/22 1125  Pulse 71 05/04/22 1125  Resp 16 05/04/22 1125  SpO2 90 % 05/04/22 1125    Last Pain:  Vitals:   05/04/22 1125  TempSrc: Oral  PainSc:       Patients Stated Pain Goal: 0 (50/87/19 9412)  Complications: No notable events documented.

## 2022-05-04 NOTE — Anesthesia Postprocedure Evaluation (Signed)
Anesthesia Post Note  Patient: Joseph Pham  Procedure(s) Performed: CYSTOSCOPY WITH RETROGRADE PYELOGRAM, URETEROSCOPY AND STENT PLACEMENT (Left: Ureter)     Patient location during evaluation: PACU Anesthesia Type: General Level of consciousness: awake and alert Pain management: pain level controlled Vital Signs Assessment: post-procedure vital signs reviewed and stable Respiratory status: spontaneous breathing, nonlabored ventilation, respiratory function stable and patient connected to nasal cannula oxygen Cardiovascular status: blood pressure returned to baseline and stable Postop Assessment: no apparent nausea or vomiting Anesthetic complications: no  No notable events documented.  Last Vitals:  Vitals:   05/04/22 1032 05/04/22 1125  BP: 100/66 101/63  Pulse: 76 71  Resp: 19 16  Temp:  36.8 C  SpO2: 92% 90%    Last Pain:  Vitals:   05/04/22 1125  TempSrc: Oral  PainSc:                  Joseph Pham S

## 2022-05-04 NOTE — Anesthesia Procedure Notes (Signed)
Procedure Name: LMA Insertion Date/Time: 05/04/2022 9:36 AM  Performed by: Renato Shin, CRNAPre-anesthesia Checklist: Patient identified, Emergency Drugs available, Suction available and Patient being monitored Patient Re-evaluated:Patient Re-evaluated prior to induction Oxygen Delivery Method: Circle system utilized Preoxygenation: Pre-oxygenation with 100% oxygen Induction Type: IV induction Ventilation: Mask ventilation without difficulty LMA: LMA with gastric port inserted LMA Size: 4.0 Number of attempts: 1 Placement Confirmation: positive ETCO2 and breath sounds checked- equal and bilateral Tube secured with: Tape Dental Injury: Teeth and Oropharynx as per pre-operative assessment

## 2022-05-04 NOTE — Op Note (Signed)
Operative Note  Preoperative diagnosis:  1.  Obstructing left ureteral stone  Postoperative diagnosis: 1.  Infected and obstructing left ureteral stone  Procedure(s): 1.  Cystoscopy with left ureteral stent placement 2.  Left retrograde pyelogram with intraoperative interpretation fluoroscopic imaging  Surgeon: Ellison Hughs, MD  Assistants:  None  Anesthesia:  General  Complications:  None  EBL: Less than 5 mL  Specimens: 1.  Urine specimen collected for culture  Drains/Catheters: 1.  Left 6 French, 24 cm JJ stent with tether 2.  16 French Foley catheter  Intraoperative findings:   Left retrograde pyelogram revealed a filling defect within the distal aspect of the left ureter, consistent with the obstructing stone seen on recent CT.  No other filling defects were seen within the ureter.  The left renal pelvis was not completely opacified due to the degree of obstruction. Large amount of purulent fluid was expressed from the left ureter following wire manipulation of the obstructing stone  Indication:  Joseph Pham is a 71 y.o. male with with an obstructing 6 mm left distal ureteral stone with low-grade fevers and acute on chronic renal failure.  He has been consented for the above procedures, voices understanding and wishes to proceed.  Description of procedure:  After informed consent was obtained, the patient was brought to the operating room and general LMA anesthesia was administered. The patient was then placed in the dorsolithotomy position and prepped and draped in the usual sterile fashion. A timeout was performed. A 23 French rigid cystoscope was then inserted into the urethral meatus and advanced into the bladder under direct vision. A complete bladder survey revealed no intravesical pathology.  A 5 French ureteral catheter was then inserted into the left ureteral orifice and a retrograde pyelogram was obtained, with the findings listed above.  A  Glidewire was then used to intubate the lumen of the ureteral catheter and was advanced up to the left renal pelvis, under fluoroscopic guidance.  The catheter was then removed, leaving the wire in place.  A 6 French, 24 cm JJ stent was then advanced over the wire and into good position within the left collecting system, confirming placement via fluoroscopy.  A significant amount of purulent urine was seen draining following stent placement.  A urine specimen was obtained for culture.  A 16 French Foley catheter was then placed and set to gravity drainage.  He tolerated the procedure well and was transferred to the postanesthesia in stable condition.  Plan: Continue to monitor on the floor.  Continue Rocephin while urine culture is pending.

## 2022-05-05 ENCOUNTER — Encounter (HOSPITAL_COMMUNITY): Payer: Self-pay | Admitting: Urology

## 2022-05-05 DIAGNOSIS — N200 Calculus of kidney: Secondary | ICD-10-CM | POA: Diagnosis not present

## 2022-05-05 LAB — CBC
HCT: 36.4 % — ABNORMAL LOW (ref 39.0–52.0)
Hemoglobin: 12 g/dL — ABNORMAL LOW (ref 13.0–17.0)
MCH: 29.7 pg (ref 26.0–34.0)
MCHC: 33 g/dL (ref 30.0–36.0)
MCV: 90.1 fL (ref 80.0–100.0)
Platelets: 203 10*3/uL (ref 150–400)
RBC: 4.04 MIL/uL — ABNORMAL LOW (ref 4.22–5.81)
RDW: 13.1 % (ref 11.5–15.5)
WBC: 14.8 10*3/uL — ABNORMAL HIGH (ref 4.0–10.5)
nRBC: 0 % (ref 0.0–0.2)

## 2022-05-05 LAB — URINE CULTURE: Culture: NO GROWTH

## 2022-05-05 LAB — GLUCOSE, CAPILLARY
Glucose-Capillary: 174 mg/dL — ABNORMAL HIGH (ref 70–99)
Glucose-Capillary: 160 mg/dL — ABNORMAL HIGH (ref 70–99)
Glucose-Capillary: 151 mg/dL — ABNORMAL HIGH (ref 70–99)

## 2022-05-05 LAB — BASIC METABOLIC PANEL
Anion gap: 8 (ref 5–15)
BUN: 31 mg/dL — ABNORMAL HIGH (ref 8–23)
CO2: 23 mmol/L (ref 22–32)
Calcium: 8.4 mg/dL — ABNORMAL LOW (ref 8.9–10.3)
Chloride: 105 mmol/L (ref 98–111)
Creatinine, Ser: 1.95 mg/dL — ABNORMAL HIGH (ref 0.61–1.24)
GFR, Estimated: 36 mL/min — ABNORMAL LOW (ref >60)
Glucose, Bld: 171 mg/dL — ABNORMAL HIGH (ref 70–99)
Potassium: 4.5 mmol/L (ref 3.5–5.1)
Sodium: 136 mmol/L (ref 135–145)

## 2022-05-05 MED ORDER — GLIPIZIDE 2.5 MG PO TABS: 2.5000 mg | ORAL_TABLET | Freq: Every day | ORAL | 0 refills | Status: DC

## 2022-05-05 MED ORDER — SODIUM CHLORIDE 0.9 % IV SOLN: 2.0000 g | INTRAVENOUS | Status: DC

## 2022-05-05 MED ORDER — RIVAROXABAN 15 MG PO TABS
15.0000 mg | ORAL_TABLET | Freq: Every day | ORAL | Status: DC
  Administered 2022-05-05: 15 mg via ORAL
  Filled 2022-05-05: qty 1

## 2022-05-05 MED ORDER — CIPROFLOXACIN HCL 500 MG PO TABS: 500.0000 mg | ORAL_TABLET | Freq: Two times a day (BID) | ORAL | 0 refills | Status: AC

## 2022-05-05 NOTE — Plan of Care (Signed)
Patient is stable for discharge. Discharge instructions have been given. Patient understood instruction, all questions answered. Patient discharged home with wife.

## 2022-05-05 NOTE — Progress Notes (Signed)
1 Day Post-Op Subjective: Afebrile, patient feeling well minimal discomfort.  Urine cultures negative so far.  Patient wants to go home.  Foley draining clear urine  Objective: Vital signs in last 24 hours: Temp:  [97.5 F (36.4 C)-98.4 F (36.9 C)] 97.7 F (36.5 C) (12/04 1319) Pulse Rate:  [66-99] 66 (12/04 1319) Resp:  [16-18] 18 (12/04 1319) BP: (120-147)/(72-79) 130/75 (12/04 1319) SpO2:  [96 %] 96 % (12/04 1319)  Intake/Output from previous day: 12/03 0701 - 12/04 0700 In: 1798 [P.O.:420; I.V.:1278; IV Piggyback:100] Out: 2100 [Urine:2100] Intake/Output this shift: Total I/O In: 120 [P.O.:120] Out: 1050 [Urine:1050]  Physical Exam:  General: Alert and oriented a  Lab Results: Recent Labs    05/03/22 1810 05/04/22 0408 05/05/22 0811  HGB 13.0 11.5* 12.0*  HCT 39.3 34.7* 36.4*   BMET Recent Labs    05/04/22 0408 05/05/22 0811  NA 136 136  K 4.1 4.5  CL 103 105  CO2 24 23  GLUCOSE 140* 171*  BUN 25* 31*  CREATININE 2.12* 1.95*  CALCIUM 8.4* 8.4*     Studies/Results: DG C-Arm 1-60 Min-No Report  Result Date: 05/04/2022 Fluoroscopy was utilized by the requesting physician.  No radiographic interpretation.   CT Renal Stone Study  Result Date: 05/03/2022 CLINICAL DATA:  71 year old male with acute abdominal, flank and pelvic pain. History of obstructing distal LEFT ureteral calculus which was removed on 04/22/2022. History of prostate cancer EXAM: CT ABDOMEN AND PELVIS WITHOUT CONTRAST TECHNIQUE: Multidetector CT imaging of the abdomen and pelvis was performed following the standard protocol without IV contrast. RADIATION DOSE REDUCTION: This exam was performed according to the departmental dose-optimization program which includes automated exposure control, adjustment of the mA and/or kV according to patient size and/or use of iterative reconstruction technique. COMPARISON:  04/09/2022 CT FINDINGS: Please note that parenchymal and vascular abnormalities may  be missed as intravenous contrast was not administered. Lower chest: No acute abnormality. Hepatobiliary: Hepatic steatosis again noted without focal hepatic abnormality. The gallbladder is unremarkable. There is no evidence of intrahepatic or extrahepatic biliary dilatation. Pancreas: Unremarkable Spleen: Unremarkable Adrenals/Urinary Tract: A 6 mm distal LEFT ureteral calculus (1 cm above the UVJ) causes moderate LEFT hydroureteronephrosis. Nonobstructing bilateral renal calculi are again identified, punctate calculus within RIGHT UPPER pole and several LEFT renal calculi, the largest measuring 14 mm. There is no evidence of obstructing RIGHT urinary calculus. The bladder and adrenal glands are unremarkable. Stomach/Bowel: Stomach is within normal limits. No evidence of bowel wall thickening, distention, or inflammatory changes. Vascular/Lymphatic: Aortic atherosclerosis. No enlarged abdominal or pelvic lymph nodes. Reproductive: Prostate seeds are again noted. Other: No ascites, focal collection or pneumoperitoneum. A small to moderate umbilical hernia containing fat is again noted. Musculoskeletal: No acute or suspicious bony abnormalities are noted. Posterior/interbody fusion changes at L4-5 again noted. IMPRESSION: 1. 6 mm distal LEFT ureteral calculus (1 cm above the UVJ) causing moderate LEFT hydroureteronephrosis. 2. Bilateral nephrolithiasis 3. Hepatic steatosis. 4. Small to moderate umbilical hernia containing fat. 5.  Aortic Atherosclerosis (ICD10-I70.0). Electronically Signed   By: Margarette Canada M.D.   On: 05/03/2022 19:08    Assessment/Plan: 1.  Left ureteral calculus status post left JJ stent improving cultures so far negative Plan/recommendation.  Patient will need definitive ureteroscopy and laser lithotripsy.  Will likely schedule around 1219.  Okay to discontinue Foley and discharge home on oral antibiotic likely Cipro.  My office will contact patient regarding timing of his surgical  procedure.    LOS: 2 days  Remi Haggard 05/05/2022, 4:27 PM

## 2022-05-05 NOTE — Discharge Summary (Signed)
Physician Discharge Summary  Joseph Pham WVP:[id number] DOB: 1950-12-12 DOA: 05/03/2022  PCP: Joseph Sheriff, MD  Admit date: 05/03/2022 Discharge date: 05/05/2022  Admitted From: Home Discharge disposition: Home  Recommendations at discharge:  7 more days of oral ciprofloxacin Follow-up with urology as an outpatient Metformin has been stopped because of elevated creatinine.  You have been started on glipizide.  Brief narrative: Joseph Pham is a 71 y.o. male with PMH significant for prostate cancer s/p brachytherapy, PE on Xarelto, DM2, HTN, OSA, AAA, CKD, GERD, history of nephrolithiasis. Patient presented to the ED on 12/2 with complaint of fever, left flank pain. 11/8, patient underwent urgent stent placement for an obstructing infected left distal ureteral calculus/multiple left ureteral calculi. 11/21, underwent laser lithotripsy and stone extraction and left JJ stent exchange by Dr. Milford Cage. Patient did fine after the procedure until 12/1 when he started having left flank pain radiating to the groin and a fever of 100.6 and hence came to the ED.  In the ED, temperature was 98.5, hemodynamically stable WBC count 12, lactic acid elevated 2.3, creatinine elevated 2.23 from a prior of 1.96 Urinalysis showed small leukocyte, negative nitrate and greater than 50 WBC CT renal study showed 6 mm distal left ureteral calculus with moderate left hydroureteronephrosis.. EDP discussed with urology Dr. Gilford Rile who recommended hospitalization with a tentative plan of stenting.  Admitted to [facility] service.  Subjective: Patient was seen and examined this morning.  Pleasant elderly male.  Lying on bed.  Not in distress. Wife at bedside. Adequately hydrated.  Assessment and plan: Left ureteral calculus with hydronephrosis History of nephrolithiasis s/p stent recently Urology consulted.   Patient underwent left retrograde pyelogram.  It revealed a filling defect within the  distal aspect of the left ureter, consistent with the obstructing stone seen on recent CT.  No other filling defects were seen within the ureter.  The left renal pelvis was not completely opacified due to the degree of obstruction. Large amount of purulent fluid was expressed from the left ureter following wire manipulation of the obstructing stone. Urology follow-up appreciated. Foley catheter was removed.  Voiding trial successful Follow-up with urology as an outpatient for ureteroscopy in 1 to 2 weeks.   UTI Reported fever at home.  WBC count and lactic acid level were elevated on admission Urinalysis was not clearly indicated of infection.  However during the pyelogram, large amount of purulent urine was expressed from the left ureter.  Currently on empiric coverage with IV Rocephin.   WBC and lactic acid level improved. No growth in urine culture report.  Discussed with urologist.  Will discharge him on 7 day course of oral ciprofloxacin.  AKI on CKD 3B Baseline creatinine 1.72 from 11/9.  Presented with creatinine elevated to 2.23 due to obstruction.  Gradually improving on IV fluid.  Recent Labs    04/09/22 1607 04/10/22 0431 04/11/22 1803 05/03/22 1810 05/04/22 0408 05/05/22 0811  BUN 26* 26* 35* 24* 25* 31*  CREATININE 2.32* 1.72* 1.96* 2.23* 2.12* 1.95*   Type 2 diabetes mellitus A1c 6.2 on 04/18/2022 PTA on metformin 500 mg at bedtime.  Creatinine seems elevated at baseline.  I would stop metformin permanently. Currently on sliding scale insulin with Accu-Cheks Blood sugar running elevated over 150.  I will add glipizide 2.5 mg daily to his regimen.  Follow-up with PCP for dose adjustment. 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  General exam: Pleasant, elderly Caucasian male.  Not in physical distress Skin: No rashes, lesions or ulcers. HEENT: Atraumatic, normocephalic, no obvious bleeding Lungs: Clear to auscultation bilaterally CVS: Regular rate and rhythm, no murmur GI/Abd soft, nontender, distended from obesity, bowel sound present CNS: Alert, awake, oriented x 3 Psychiatry: Mood appropriate Extremities: No pedal edema, no calf tenderness  Follow ups:    Follow-up Information     Joseph Sheriff, MD Follow up.   Specialty: Family Medicine Contact information: [place] [id number] [phone]                 Discharge Instructions:   Discharge Instructions     Call MD for:  difficulty breathing, headache or visual disturbances   Complete by: As directed    Call MD for:  extreme fatigue   Complete by: As directed    Call MD for:  hives   Complete by: As directed    Call MD for:  persistant dizziness or light-headedness   Complete by: As directed    Call MD for:  persistant nausea and vomiting   Complete by: As directed    Call MD for:  severe uncontrolled pain   Complete by: As directed    Call MD for:  temperature >100.4   Complete by: As directed    Diet Carb Modified   Complete by: As directed    Discharge instructions   Complete by: As directed    Recommendations at discharge:   7 more days of oral ciprofloxacin  Follow-up with urology as an outpatient  Metformin has been stopped because of elevated creatinine.  You have been started on glipizide.  Discharge  instructions for diabetes mellitus: Check blood sugar 3 times a day and bedtime at home. If blood sugar running above 200 or less than 70 please call your MD to adjust insulin. If you notice signs and symptoms of hypoglycemia (low blood sugar) like jitteriness, confusion, thirst, tremor and sweating, please check blood sugar, drink sugary drink/biscuits/sweets to increase sugar level and call MD or return to ER.    General discharge instructions: Follow with Primary MD Joseph Sheriff, MD in 7 days  Please request your PCP  to go over your hospital tests, procedures, radiology results at the follow up. Please get your medicines reviewed and adjusted.  Your PCP may decide to repeat certain labs or tests as needed. Do not drive, operate heavy machinery, perform activities at heights, swimming or participation in water activities or provide baby sitting services if your were admitted for syncope or siezures until you have seen by Primary MD or a Neurologist and advised to do so again. [place] Controlled Substance Reporting System database was reviewed. Do not drive, operate heavy machinery, perform activities at heights, swim, participate  in water activities or provide baby-sitting services while on medications for pain, sleep and mood until your outpatient physician has reevaluated you and advised to do so again.  You are strongly recommended to comply with the dose, frequency and duration of prescribed medications. Activity: As tolerated with Full fall precautions use walker/cane & assistance as needed Avoid using any recreational substances like cigarette, tobacco, alcohol, or non-prescribed drug. If you experience worsening of your admission symptoms, develop shortness of breath, life threatening emergency, suicidal or homicidal thoughts you must seek medical attention immediately by calling 911 or calling your MD immediately  if symptoms less severe. You must read complete  instructions/literature along with all the possible adverse reactions/side effects for all the medicines you take and that have been prescribed to you. Take any new medicine only after you have completely understood and accepted all the possible adverse reactions/side effects.  Wear Seat belts while driving. You were cared for by a hospitalist during your hospital stay. If you have any questions about your discharge medications or the care you received while you were in the hospital after you are discharged, you can call the unit and ask to speak with the hospitalist or the covering physician. Once you are discharged, your primary care physician will handle any further medical issues. Please note that NO REFILLS for any discharge medications will be authorized once you are discharged, as it is imperative that you return to your primary care physician (or establish a relationship with a primary care physician if you do not have one).   Increase activity slowly   Complete by: As directed        Discharge Medications:   Allergies as of 05/05/2022       Reactions   Aspirin Anaphylaxis   Amoxicillin Itching        Medication List     STOP taking these medications    metFORMIN 500 MG tablet Commonly known as: GLUCOPHAGE   zolpidem 12.5 MG CR tablet Commonly known as: Ambien CR       TAKE these medications    atorvastatin 10 MG tablet Commonly known as: LIPITOR Take 10 mg by mouth at bedtime.   cholecalciferol 25 MCG (1000 UNIT) tablet Commonly known as: VITAMIN D3 Take 1,000 Units by mouth daily.   ciprofloxacin 500 MG tablet Commonly known as: Cipro Take 1 tablet (500 mg total) by mouth 2 (two) times daily for 7 days. What changed:  medication strength how much to take   famotidine 40 MG tablet Commonly known as: PEPCID Take 40 mg by mouth at bedtime.   fexofenadine 180 MG tablet Commonly known as: ALLEGRA Take 180 mg by mouth daily.   fluticasone 50 MCG/ACT nasal  spray Commonly known as: FLONASE Place 2 sprays into the nose daily as needed for allergies.   glipiZIDE 2.5 MG Tabs Take 2.5 mg by mouth daily before breakfast.   lisinopril 10 MG tablet Commonly known as: ZESTRIL Take 10 mg by mouth daily.   Melatonin 5 MG Caps Take 5 mg by mouth at bedtime.   omega-3 acid ethyl esters 1 g capsule Commonly known as: LOVAZA Take 4 g by mouth at bedtime.   omeprazole 20 MG capsule Commonly known as: PRILOSEC Take 20 mg by mouth daily.   REFRESH OPTIVE PF OP Place 1 drop into both eyes 2 (two) times daily as needed (dry eyes).   Rivaroxaban 15 MG Tabs tablet Commonly known as: XARELTO Take 1 tablet (15 mg total) by  mouth daily.   traMADol 50 MG tablet Commonly known as: ULTRAM Take 50 mg by mouth 2 (two) times daily.   traZODone 100 MG tablet Commonly known as: DESYREL Take 50 mg by mouth at bedtime.          The results of significant diagnostics from this hospitalization (including imaging, microbiology, ancillary and laboratory) are listed below for reference.    Procedures and Diagnostic Studies:   DG C-Arm 1-60 Min-No Report  Result Date: 05/04/2022 Fluoroscopy was utilized by the requesting physician.  No radiographic interpretation.   CT Renal Stone Study  Result Date: 05/03/2022 CLINICAL DATA:  71 year old male with acute abdominal, flank and pelvic pain. History of obstructing distal LEFT ureteral calculus which was removed on 04/22/2022. History of prostate cancer EXAM: CT ABDOMEN AND PELVIS WITHOUT CONTRAST TECHNIQUE: Multidetector CT imaging of the abdomen and pelvis was performed following the standard protocol without IV contrast. RADIATION DOSE REDUCTION: This exam was performed according to the departmental dose-optimization program which includes automated exposure control, adjustment of the mA and/or kV according to patient size and/or use of iterative reconstruction technique. COMPARISON:  04/09/2022 CT FINDINGS:  Please note that parenchymal and vascular abnormalities may be missed as intravenous contrast was not administered. Lower chest: No acute abnormality. Hepatobiliary: Hepatic steatosis again noted without focal hepatic abnormality. The gallbladder is unremarkable. There is no evidence of intrahepatic or extrahepatic biliary dilatation. Pancreas: Unremarkable Spleen: Unremarkable Adrenals/Urinary Tract: A 6 mm distal LEFT ureteral calculus (1 cm above the UVJ) causes moderate LEFT hydroureteronephrosis. Nonobstructing bilateral renal calculi are again identified, punctate calculus within RIGHT UPPER pole and several LEFT renal calculi, the largest measuring 14 mm. There is no evidence of obstructing RIGHT urinary calculus. The bladder and adrenal glands are unremarkable. Stomach/Bowel: Stomach is within normal limits. No evidence of bowel wall thickening, distention, or inflammatory changes. Vascular/Lymphatic: Aortic atherosclerosis. No enlarged abdominal or pelvic lymph nodes. Reproductive: Prostate seeds are again noted. Other: No ascites, focal collection or pneumoperitoneum. A small to moderate umbilical hernia containing fat is again noted. Musculoskeletal: No acute or suspicious bony abnormalities are noted. Posterior/interbody fusion changes at L4-5 again noted. IMPRESSION: 1. 6 mm distal LEFT ureteral calculus (1 cm above the UVJ) causing moderate LEFT hydroureteronephrosis. 2. Bilateral nephrolithiasis 3. Hepatic steatosis. 4. Small to moderate umbilical hernia containing fat. 5.  Aortic Atherosclerosis (ICD10-I70.0). Electronically Signed   By: Margarette Canada M.D.   On: 05/03/2022 19:08     Labs:   Basic Metabolic Panel: Recent Labs  Lab 05/03/22 1810 05/04/22 0408 05/05/22 0811  NA 136 136 136  K 4.1 4.1 4.5  CL 100 103 105  CO2 '25 24 23  '$ GLUCOSE 141* 140* 171*  BUN 24* 25* 31*  CREATININE 2.23* 2.12* 1.95*  CALCIUM 8.9 8.4* 8.4*   GFR Estimated Creatinine Clearance: 40.8 mL/min (A) (by  C-G formula based on SCr of 1.95 mg/dL (H)). Liver Function Tests: Recent Labs  Lab 05/03/22 1810  AST 17  ALT 15  ALKPHOS 45  BILITOT 0.9  PROT 8.2*  ALBUMIN 3.4*   No results for input(s): "LIPASE", "AMYLASE" in the last 168 hours. No results for input(s): "AMMONIA" in the last 168 hours. Coagulation profile No results for input(s): "INR", "PROTIME" in the last 168 hours.  CBC: Recent Labs  Lab 05/03/22 1810 05/04/22 0408 05/05/22 0811  WBC 12.0* 9.7 14.8*  NEUTROABS 9.0*  --   --   HGB 13.0 11.5* 12.0*  HCT 39.3 34.7* 36.4*  MCV  89.5 89.9 90.1  PLT 206 180 203   Cardiac Enzymes: No results for input(s): "CKTOTAL", "CKMB", "CKMBINDEX", "TROPONINI" in the last 168 hours. BNP: Invalid input(s): "POCBNP" CBG: Recent Labs  Lab 05/04/22 2054 05/05/22 0844 05/05/22 1141 05/05/22 1616  GLUCAP 295* 174* 160* 151*   D-Dimer No results for input(s): "DDIMER" in the last 72 hours. Hgb A1c No results for input(s): "HGBA1C" in the last 72 hours. Lipid Profile No results for input(s): "CHOL", "HDL", "LDLCALC", "TRIG", "CHOLHDL", "LDLDIRECT" in the last 72 hours. Thyroid function studies No results for input(s): "TSH", "T4TOTAL", "T3FREE", "THYROIDAB" in the last 72 hours.  Invalid input(s): "FREET3" Anemia work up No results for input(s): "VITAMINB12", "FOLATE", "FERRITIN", "TIBC", "IRON", "RETICCTPCT" in the last 72 hours. Microbiology Recent Results (from the past 240 hour(s))  Urine Culture     Status: None   Collection Time: 05/03/22  6:45 PM   Specimen: Urine, Clean Catch  Result Value Ref Range Status   Specimen Description   Final    URINE, CLEAN CATCH Performed at [facility], [place] [address] [place], [place] [address]    Special Requests   Final    NONE Performed at [facility], [place] [address], [place], [place] [address]    Culture   Final    NO GROWTH Performed at [facility], [place] [address],  [place], [place] [address]    Report Status 05/04/2022 FINAL  Final  Urine Culture     Status: None   Collection Time: 05/04/22  9:53 AM   Specimen: Kidney, Left; Urine  Result Value Ref Range Status   Specimen Description   Final    KIDNEY Performed at [place] [address], [place], [place] [address]    Special Requests   Final    NONE Performed at [facility], [place] [address] [place], [place] [address]    Culture   Final    NO GROWTH Performed at [facility], [place] [address] [place], [place] [address]    Report Status 05/05/2022 FINAL  Final    Time coordinating discharge: 35 minutes  Signed: Eliot Bencivenga  [facility] 05/05/2022, 5:43 PM

## 2022-05-06 ENCOUNTER — Other Ambulatory Visit: Payer: Self-pay | Admitting: Urology

## 2022-05-09 ENCOUNTER — Encounter (HOSPITAL_COMMUNITY): Payer: Self-pay

## 2022-05-09 NOTE — Progress Notes (Addendum)
COVID Vaccine Completed:  Date of COVID positive in last 90 days:  PCP - Lovette Cliche, MD Cardiologist - Shirlee More, MD  Chest x-ray -  EKG - 04-23-22 Epic Stress Test - 05-18-2018 Epic ECHO - 03-02-19 Epic Cardiac Cath -  Pacemaker/ICD device last checked: Spinal Cord Stimulator:  Bowel Prep -   Sleep Study -  CPAP -   Fasting Blood Sugar -  Checks Blood Sugar _____ times a day  Last dose of GLP1 agonist-  N/A GLP1 instructions:  N/A   Last dose of SGLT-2 inhibitors-  N/A SGLT-2 instructions: N/A   Blood Thinner Instructions:  Xarelto.  Hold x3 days Aspirin Instructions: Last Dose:  Activity level:  Can go up a flight of stairs and perform activities of daily living without stopping and without symptoms of chest pain or shortness of breath.  Able to exercise without symptoms  Unable to go up a flight of stairs without symptoms of     Anesthesia review:  Murmur, aortic valve insufficiency, HTN, AAA, OSA, CKD, DM, poor R wave progression on EKG  Patient denies shortness of breath, fever, cough and chest pain at PAT appointment  Patient verbalized understanding of instructions that were given to them at the PAT appointment. Patient was also instructed that they will need to review over the PAT instructions again at home before surgery.

## 2022-05-09 NOTE — Patient Instructions (Addendum)
SURGICAL WAITING ROOM VISITATION Patients having surgery or a procedure may have no more than 2 support people in the waiting area - these visitors may rotate.   Children under the age of 87 must have an adult with them who is not the patient. If the patient needs to stay at the hospital during part of their recovery, the visitor guidelines for inpatient rooms apply. Pre-op nurse will coordinate an appropriate time for 1 support person to accompany patient in pre-op.  This support person may not rotate.    Please refer to the [facility] website for the visitor guidelines for Inpatients (after your surgery is over and you are in a regular room).      Your procedure is scheduled on: 05-20-22   Report to [facility] Main Entrance    Report to admitting at 9:30 AM   Call this number if you have problems the morning of surgery [phone]   Do not eat food or drink liquids:After Midnight.          If you have questions, please contact your surgeon's office.   FOLLOW  ANY ADDITIONAL PRE OP INSTRUCTIONS YOU RECEIVED FROM YOUR SURGEON'S OFFICE!!!     Oral Hygiene is also important to reduce your risk of infection.                                    Remember - BRUSH YOUR TEETH THE MORNING OF SURGERY WITH YOUR REGULAR TOOTHPASTE   Do NOT smoke after Midnight   Take these medicines the morning of surgery with A SIP OF WATER: Allegra Omeprazole Tramadol if needed   How to Manage Your Diabetes Before and After Surgery  Why is it important to control my blood sugar before and after surgery? Improving blood sugar levels before and after surgery helps healing and can limit problems. A way of improving blood sugar control is eating a healthy diet by:  Eating less sugar and carbohydrates  Increasing activity/exercise  Talking with your doctor about reaching your blood sugar goals High blood sugars (greater than 180 mg/dL) can raise your risk of infections and slow your recovery,  so you will need to focus on controlling your diabetes during the weeks before surgery. Make sure that the doctor who takes care of your diabetes knows about your planned surgery including the date and location.  How do I manage my blood sugar before surgery? Check your blood sugar at least 4 times a day, starting 2 days before surgery, to make sure that the level is not too high or low. Check your blood sugar the morning of your surgery when you wake up and every 2 hours until you get to the Short Stay unit. If your blood sugar is less than 70 mg/dL, you will need to treat for low blood sugar: Do not take insulin. Treat a low blood sugar (less than 70 mg/dL) with  cup of clear juice (cranberry or apple), 4 glucose tablets, OR glucose gel. Recheck blood sugar in 15 minutes after treatment (to make sure it is greater than 70 mg/dL). If your blood sugar is not greater than 70 mg/dL on recheck, call [phone] for further instructions. Report your blood sugar to the short stay nurse when you get to Short Stay.  If you are admitted to the hospital after surgery: Your blood sugar will be checked by the staff and you will probably be given  insulin after surgery (instead of oral diabetes medicines) to make sure you have good blood sugar levels. The goal for blood sugar control after surgery is 80-180 mg/dL.   WHAT DO I DO ABOUT MY DIABETES MEDICATION?  Do not take oral diabetes medicines (pills) the morning of surgery.  DO NOT TAKE THE FOLLOWING 7 DAYS PRIOR TO SURGERY: Ozempic, Wegovy, Rybelsus (Semaglutide), Byetta (exenatide), Bydureon (exenatide ER), Victoza, Saxenda (liraglutide), or Trulicity (dulaglutide) Mounjaro (Tirzepatide) Adlyxin (Lixisenatide), Polyethylene Glycol Loxenatide.   Reviewed and Endorsed by [facility] Patient Education Committee, August 2015  Bring CPAP mask and tubing day of surgery.                              You may not have any metal on your body including   jewelry, and body piercing             Do not wear  lotions, powders, cologne, or deodorant              Men may shave face and neck.   Do not bring valuables to the hospital. [place].   Contacts, dentures or bridgework may not be worn into surgery.  DO NOT [place]. PHARMACY WILL DISPENSE MEDICATIONS LISTED ON YOUR MEDICATION LIST TO YOU DURING YOUR ADMISSION [place]!    Patients discharged on the day of surgery will not be allowed to drive home.  Someone NEEDS to stay with you for the first 24 hours after anesthesia.   Special Instructions: Bring a copy of your healthcare power of attorney and living will documents the day of surgery if you haven't scanned them before.              Please read over the following fact sheets you were given: IF [place]  If you received a COVID test during your pre-op visit  it is requested that you wear a mask when out in public, stay away from anyone that may not be feeling well and notify your surgeon if you develop symptoms. If you test positive for Covid or have been in contact with anyone that has tested positive in the last 10 days please notify you surgeon.  [place] - Preparing for Surgery Before surgery, you can play an important role.  Because skin is not sterile, your skin needs to be as free of germs as possible.  You can reduce the number of germs on your skin by washing with CHG (chlorahexidine gluconate) soap before surgery.  CHG is an antiseptic cleaner which kills germs and bonds with the skin to continue killing germs even after washing. Please DO NOT use if you have an allergy to CHG or antibacterial soaps.  If your skin becomes reddened/irritated stop using the CHG and inform your nurse when you arrive at Short Stay. Do not shave (including legs and underarms) for at least 48 hours  prior to the first CHG shower.  You may shave your face/neck.  Please follow these instructions carefully:  1.  Shower with CHG Soap the night before surgery and the  morning of surgery.  2.  If you choose to wash your hair, wash your hair first as usual with your normal  shampoo.  3.  After you shampoo, rinse your hair and body thoroughly to remove the shampoo.  4.  Use CHG as you would any other liquid soap.  You can apply chg directly to the skin and wash.  Gently with a scrungie or clean washcloth.  5.  Apply the CHG Soap to your body ONLY FROM THE NECK DOWN.   Do   not use on face/ open                           Wound or open sores. Avoid contact with eyes, ears mouth and   genitals (private parts).                       Wash face,  Genitals (private parts) with your normal soap.             6.  Wash thoroughly, paying special attention to the area where your    surgery  will be performed.  7.  Thoroughly rinse your body with warm water from the neck down.  8.  DO NOT shower/wash with your normal soap after using and rinsing off the CHG Soap.                9.  Pat yourself dry with a clean towel.            10.  Wear clean pajamas.            11.  Place clean sheets on your bed the night of your first shower and do not  sleep with pets. Day of Surgery : Do not apply any lotions/deodorants the morning of surgery.  Please wear clean clothes to the hospital/surgery center.  FAILURE TO FOLLOW THESE INSTRUCTIONS MAY RESULT IN THE CANCELLATION OF YOUR SURGERY  PATIENT SIGNATURE_________________________________  NURSE SIGNATURE__________________________________  ________________________________________________________________________

## 2022-05-13 ENCOUNTER — Encounter (HOSPITAL_COMMUNITY): Payer: Self-pay

## 2022-05-13 ENCOUNTER — Other Ambulatory Visit: Payer: Self-pay

## 2022-05-13 ENCOUNTER — Encounter (HOSPITAL_COMMUNITY)
Admission: RE | Admit: 2022-05-13 | Discharge: 2022-05-13 | Disposition: A | Discharge status: Home / Self Care | Payer: Medicare Other | Source: Ambulatory Visit | Attending: Urology | Admitting: Urology

## 2022-05-13 VITALS — BP 141/82 | HR 87 | Temp 98.4°F | Resp 20 | Ht 66.0 in | Wt 249.4 lb

## 2022-05-13 DIAGNOSIS — E119 Type 2 diabetes mellitus without complications: Secondary | ICD-10-CM

## 2022-05-13 DIAGNOSIS — N289 Disorder of kidney and ureter, unspecified: Secondary | ICD-10-CM | POA: Diagnosis not present

## 2022-05-13 DIAGNOSIS — Z01812 Encounter for preprocedural laboratory examination: Secondary | ICD-10-CM | POA: Insufficient documentation

## 2022-05-13 HISTORY — DX: Family history of other specified conditions: Z84.89

## 2022-05-13 HISTORY — DX: Cardiac murmur, unspecified: R01.1

## 2022-05-13 LAB — BASIC METABOLIC PANEL
Anion gap: 8 (ref 5–15)
BUN: 17 mg/dL (ref 8–23)
CO2: 27 mmol/L (ref 22–32)
Calcium: 9.7 mg/dL (ref 8.9–10.3)
Chloride: 105 mmol/L (ref 98–111)
Creatinine, Ser: 1.75 mg/dL — ABNORMAL HIGH (ref 0.61–1.24)
GFR, Estimated: 41 mL/min — ABNORMAL LOW (ref >60)
Glucose, Bld: 107 mg/dL — ABNORMAL HIGH (ref 70–99)
Potassium: 4.4 mmol/L (ref 3.5–5.1)
Sodium: 140 mmol/L (ref 135–145)

## 2022-05-13 LAB — GLUCOSE, CAPILLARY: Glucose-Capillary: 121 mg/dL — ABNORMAL HIGH (ref 70–99)

## 2022-05-19 NOTE — H&P (Signed)
had prostate cancer treated.  HPI: Joseph Pham is a 71 year-old male established patient who is here for prostate cancer which has been treated.   His PSA at his time of diagnosis was 5.53. His cancer was T2a, Gleason score 4+3 = 7 in 1 core, 3+4 = 7 in 4 cores and 3+3 = 6 in 3 cores. His most recent PSA is <0.015. His PSA results have been low since his prostate cancer treatment.   He received radiation therapy for his cancer. He was treated with Brachytherapy for his cancer. His radiation treatment was complete 08/08/2011. He has not undergone Hormonal Therapy for treatment. He does not have urinary incontinence.   He has not seen blood in his stool since the biopsy. He is not having new bone pain. He has not recently had unwanted weight loss. This condition would be considered of mild to moderate severity with no modifying factors or associated signs or symptoms other than as noted above.   Adenocarcinoma of the prostate: His PSA in 10/11 was 5.9. A repeat PSA in 5/12 was noted to be 5.53 with a free to total ratio of 5%. He was also noted on DRE to have induration in the right apex of the prostate. Prostate vol. - 26.6cc.  Path. - adenocarcinoma Gleason score 4+3 = 7 in 1 core, 3+4 = 7 in 4 cores and 3+3 = 6 in 3 cores for a total of 8/12 cores positive.  Clinical stage T2a.  Treatment: I-125 seed implant on 08/08/11.   04/20/18: He reports to me that since I have seen him last he has had 04/26/19: He returns today for annual follow-up of prostate cancer treated with I 125 seeds in 3/13. He has been doing well over the past year from urologic standpoint with no new urologic complaints. In addition he has not had any weight loss or unexplained bone pain. Did have a PE and is currently on Xarelto. His PSA went from undetectable to just slightly detectable last year but I rechecked it and it had returned to undetectable.   -04/25/20-patient with history of adenocarcinoma the prostate status post  interstitial brachytherapy in March 2013. PSAs have been undetectable last PSA was undetectable on 04/18/2020. Here for follow-up. No interim GU issues except that he had an episode of dark urine earlier this morning. Has had no previous episodes of hematuria.  Micro urinalysis today shows greater than 60 RBCs  -05/23/20-patient with history of adenocarcinoma the prostate status post interstitial brachytherapy in March 2013 as above. Has had some recent gross hematuria and has had CT renal scan in the interim on 05/04/2020. This is reviewed and shows bilateral small probable renal cyst but also has significant stone burden in the left kidney with several stones largest being 1.3 cm stone left renal pelvis area. They are nonobstructing. No significant stones noted on the right. Here for cysto to assess bladder. Micro urinalysis today is clear on urine spun sediment.  Cysto performed today and shows: Normal pendulous prostatic urethral area. Bladder was inspected show no mucosal lesions.  -09/03/20-patient with history of adenocarcinoma the prostate status post interstitial brachytherapy in March 2013 as above also has had recent hematuria felt secondary to probable left renal calculi noted on CT scan in December of 2021. We managed conservatively. He presents today with   09/13/2020 71 year old male who underwent a left-sided ureteroscopy 1 week ago. The procedure was uncomplicated and he tolerated it well. He presents today for stent removal. He denies nausea,  vomiting. He denies fevers and chills. He is not having any flank pain or discomfort. He does report some irritative voiding symptoms.  -11/21/20-patient status post recent left ureteroscopy with laser on 09/06/2020. Stent removed on 09/13/2020. Is now here for follow-up of his prostate cancer. He underwent interstitial brachytherapy in March 2013 PSAs have been undetectable. Last PSA 04/18/2020.  KUB is reviewed today shows no obvious ureteral stone.  There is a persistent 8 mm calcification overlying the left lower pole area consistent with known stone in the left kidney. No interim GU issues or hematuria.  -05/15/21-patient with history of nephrolithiasis also history of adenocarcinoma the prostate status post interstitial brachytherapy in 2013. PSAs have been undetectable. Last PSA undetectable on 05/10/2021. No interim stone issues. Here for follow-up KUB.  Micro urinalysis  KUB is reviewed today and shows: Persistent calcification over the left lower calyceal area measuring about 1 cm in size.   -11/22/21-patient with known left renal calculus and history of adenocarcinoma the prostate status post interstitial brachytherapy in 2013. PSAs been undetectable with last value being undetectable on 05/10/2021. No interim stone issues/hematuria. Here for follow-up KUB. Anticoagulation and would be at high risk for interventional treatment of his stone.  Micro urinalysis shows 6-10 WBCs greater than 60 RBCs and rare bacteria.  KUB is reviewed today and shows: Spinal fusion rods L4-5, interstitial brachytherapy seeds noted. There is persistent calcification approximately 8-84m in size overlying the left lower pole area of the left renal contour which is unchanged. No stone seen overlying the right renal shadow or over the expected course of the ureters.   04/09/2022  History as above. Briefly, has a history of prostate cancer status post brachytherapy in 2013. Also has a history of nephrolithiasis and has undergone multiple stone procedures in the past. Last underwent left ureteroscopy on 09/06/2020. He comes in today with a 1 day history of severe left-sided flank pain, nausea, vomiting. Also had shaking and chills last night. Urinalysis consistent with UTI. Temperature 100.6, heart rate 95.   04/29/2022: Patient underwent urgent ureteral stenting with the on-call urologist at time of last office visit. Also treated for concurrent UTI. Then taken back for  definitive URS with Dr NMilford Cageon 11/21. Intraoperatively a 6 mm left distal ureteral calculi with significant ureteral edema at stone lodgment site was identified. Stent was able to be fragmented and completely extracted. Dr. NMilford Cagewas not able to pass access sheath due to narrowing of the ureter so additional left renal calculi were not addressed. Ureteral stent with tether string allowed to retract into the urethra was placed at the conclusion of procedure in anticipation of augmenting ease of removal today. Overall patient doing well, tolerating stent with expected lower urinary tract symptomology. Pain has been controlled, no interval fevers or chills, nausea/vomiting or other signs/symptoms of systemic infection.  -05/18/22-history of left ureteral calculus requiring ureteroscopy laser lithotripsy as above.  Patient's stent was removed but then another stone dislodged from the kidney on 05/04/2022 which required urgent stenting again by Dr. WLovena Neighbours  Patient has now recovered after antibiotic therapy presents the time to go cystoscopy and left ureteroscopy for definitive management of his left ureteral calculus. ALLERGIES: Aspirin TABS Trimox CAPS    MEDICATIONS: Allegra 60 mg capsule Oral  Atorvastatin Calcium 10 mg tablet Oral  Famotidine 40 mg tablet  Fish Oil CAPS Oral  Lisinopril 10 mg tablet Oral  Melatonin 5 mg capsule Oral  Metformin Hcl 500 mg tablet Oral  Multivitamin/Iron TABS Oral  Omega 3  Rosuvastatin Calcium 10 mg tablet  Tramadol Hcl  Trazodone Hcl 100 mg tablet  Vitamin B Complex  Vitamin B12  Vitamin C  Xarelto 20 mg tablet     GU PSH: Cysto Remove Stent FB Sim - 2022 Cysto Uretero Balloon Dil Strict - 2015 Cysto Uretero Lithotripsy - 2015, 2013, 2010 Cystoscopy - 05/23/2020, 2017 Cystoscopy Insert Stent - 04/09/2022, 2015, 2013, 2010 ESWL - 2015, 2010 Locm 300-'399Mg'$ /Ml Iodine,1Ml - 05/04/2020 Prostate Needle Biopsy - 2012 Ureteroscopic laser litho - 2022        PSH Notes: Lithotripsy, Cystoscopy With Ureteroscopy With Lithotripsy, Cystourethroscopy W/ Ureteroscopy W/ Tx Of Ureteral Strict, Cystoscopy With Insertion Of Ureteral Stent Right, Cystoscopy With Ureteroscopy With Lithotripsy, Cystoscopy With Insertion Of Ureteral Stent Right, Radiation Therapy, Biopsy Of The Prostate Needle, Cystoscopy With Pyeloscopy With Lithotripsy, Lithotripsy, Cystoscopy With Insertion Of Ureteral Stent Left, Hernia Repair, Knee Surgery   NON-GU PSH: Carpal Tunnel Surgery.Marland Kitchen Hernia Repair - 2010 Knee replacement, Left, 08/2018     GU PMH: Acute Cystitis/UTI - 04/09/2022, - 05/15/2021 Flank Pain - 04/09/2022, - 2022, - 2017 Microscopic hematuria (Stable) - 04/09/2022, - 11/22/2021 Renal and ureteral calculus - 04/09/2022 History of prostate cancer - 11/22/2021, - 11/21/2020, - 05/23/2020, - 2021 (Stable), His prostate remains smooth and benign. His PSA is undetectable and therefore he will return again in a year for DRE and PSA., - 2020 (Stable), His prostate was completely benign to exam but his PSA has increased slightly from where was a year ago. It still remains exceedingly low. I told him because of this I wanted to recheck the PSA again in 6 months and then will see him back again in 12 months for another PSA and DRE at that time., - 2019 (Stable), His prostate remains benign to exam with a PSA that remains exceedingly low. I have therefore recommended continued yearly DRE and PSA., - 2018 (Stable), Prostate is flat and smooth. In addition his PSA remains exceedingly low. I told him that this likely indicates complete care. I will continue to monitor him on a yearly basis with DRE and PSA., - 2017 Renal calculus - 11/22/2021, - 05/15/2021 (Stable), - 11/21/2020, - 2022 (Stable), - 05/23/2020, - 2017 (Stable), Left, His left renal calculus remained stable with no evidence of metabolic stone activity. It sounds as if he may of passed a small stone recently. That would account for the  fact that I didn't see a calcification last year that was not present on his KUB within the left kidney this year., - 2017, Bilateral kidney stones, - 2015 Gross hematuria - 05/15/2021, - 05/23/2020, - 05/04/2020, - 2021 (Stable), I have reassured the patient that with no abnormality of the upper tract and no abnormality of the bladder that his hematuria is almost certainly coming from the thinned superficial blood vessels of his prostatic urethra secondary to the effects of radiation. I did not recommend any form of surgical or pharmacologic therapy but if he continues to have recurrent bleeding he will contact me. Otherwise I have reassured him. 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  He had lithotripsy of the stone previously without fragmentation.  Right ureteroscopy and laser lithotripsy of a UPJ stone in10/13.  Right ureteroscopy and laser lithotripsy in 5/15.  ESWL 7/15 - right renal pelvis.  Stone analysis: 100% calcium oxalate  Stone risk: Normal serum studies. 24 hour urine showed hypocitraturia and low urine volume.   Erectile dysfunction: He developed erectile dysfunction which was likely secondary to both a combination of his diabetes but more likely from the effects of his prostate radiation.  Current therapy:  Viagra     NON-GU PMH: Stenosis of anus and rectum, Rectal/anal stenosis - 2015 Encounter for general adult medical examination without abnormal findings, Encounter for preventive health examination - 2015 Personal history of other endocrine, nutritional and metabolic disease, History of type 2 diabetes mellitus - 2015    FAMILY HISTORY: No pertinent family history - Other   SOCIAL HISTORY: Marital Status: Married Preferred Language: English; Ethnicity: Not Hispanic Or Latino; Race: White Current Smoking Status: Patient does not smoke anymore.  Does drink.  Drinks 1 caffeinated drink per day.     Notes: Former smoker, Alcohol Use, Tobacco Use, Occupation:, Caffeine Use, Marital History - Currently Married   REVIEW OF SYSTEMS:    GU Review Male:   Patient denies frequent urination, hard to postpone urination, burning/ pain with urination, get up at night to urinate, leakage of urine, stream starts and stops, trouble starting your stream, have to strain to urinate , erection problems, and penile pain.  Gastrointestinal (Upper):   Patient denies nausea, vomiting, and indigestion/ heartburn.  Gastrointestinal (Lower):   Patient denies diarrhea and constipation.  Constitutional:   Patient denies fever, night sweats, weight loss, and fatigue.  Skin:   Patient denies skin rash/ lesion and itching.  Eyes:   Patient denies blurred vision and double vision.  Ears/ Nose/ Throat:   Patient denies sore throat and sinus problems.  Hematologic/Lymphatic:   Patient denies swollen glands and easy bruising.  Cardiovascular:   Patient denies leg swelling and chest pains.  Respiratory:   Patient denies cough and shortness of breath.  Endocrine:   Patient denies excessive thirst.  Musculoskeletal:   Patient denies joint pain and back pain.  Neurological:   Patient denies headaches and dizziness.  Psychologic:   Patient denies depression and anxiety.   VITAL SIGNS:      04/29/2022 02:01 PM  BP 122/80  mmHg  Pulse 96 /min  Temperature 97.9 F / 36.6 C   GU PHYSICAL EXAMINATION:    Urethral Meatus: Normal size. No lesion, no wart, no discharge, no polyp. Normal location.  Penis: Circumcised, no warts, no cracks. No dorsal Peyronie's plaques, no left corporal Peyronie's plaques, no right corporal Peyronie's plaques, no scarring, no warts. No balanitis, no meatal stenosis.   MULTI-SYSTEM PHYSICAL EXAMINATION:    Constitutional: Well-nourished. No physical deformities. Normally developed. Good grooming.  Neck: Neck symmetrical, not swollen. Normal tracheal position.  Respiratory: No labored breathing, no use of accessory muscles.   Cardiovascular: Normal temperature, normal extremity pulses, no swelling, no varicosities.  Skin: No paleness, no jaundice, no cyanosis. No lesion, no ulcer, no rash.  Neurologic / Psychiatric: Oriented to time, oriented to place, oriented to person. No depression, no anxiety, no  agitation.  Gastrointestinal: Obese abdomen. No mass, no tenderness, no rigidity.   Musculoskeletal: Normal gait and station of head and neck.     Complexity of Data:  Source Of History:  Patient, Medical Record Summary  Records Review:   Previous Doctor Records, Previous Hospital Records, Previous Patient Records  Urine Test Review:   Urinalysis, Urine Culture  X-Ray Review: C.T. Abdomen/Pelvis: Reviewed Films. Reviewed Report.     05/10/21 04/18/20 04/25/19 10/19/18 04/14/18 04/16/17 04/21/16 04/24/15  PSA  Total PSA <0.015 ng/mL <0.015 ng/mL <0.015 ng/mL <0.015 ng/mL 0.12 ng/mL <0.015 ng/mL 0.021 ng/dl 0.04     PROCEDURES:         Flexible Cystoscopy Left Stent Removal - 52310  Risks, benefits, and some of the potential complications of the procedure were discussed at length with the patient including infection, bleeding, voiding discomfort, urinary retention, fever, chills, sepsis, and others. All questions were answered. Informed consent was obtained. Antibiotic prophylaxis was  given. Sterile technique and intraurethral analgesia were used.  Meatus:  Normal size. Normal location. Normal condition.  Urethra:  No strictures. Tether string from previously placed left ureteral string visualized within the urethra. Easily grasped and removed, stent inspected and discarded.  The left ureteral stent was carefully removed with a grasping instrument.    The lower urinary tract was carefully examined. The procedure was well-tolerated and without complications. Antibiotic instructions were given. Instructions were given to call the office immediately for bloody urine, difficulty urinating, urinary retention, painful or frequent urination, fever, chills, nausea, vomiting or other illness. The patient stated that he understood these instructions and would comply with them.         Urinalysis w/Scope Dipstick Dipstick Cont'd Micro  Color: Yellow Bilirubin: Neg mg/dL WBC/hpf: 20 - 40/hpf  Appearance: Slightly Cloudy Ketones: Neg mg/dL RBC/hpf: 10 - 20/hpf  Specific Gravity: 1.020 Blood: 3+ ery/uL Bacteria: Mod (26-50/hpf)  pH: 5.5 Protein: 1+ mg/dL Cystals: NS (Not Seen)  Glucose: Neg mg/dL Urobilinogen: 0.2 mg/dL Casts: NS (Not Seen)    Nitrites: Neg Trichomonas: Not Present    Leukocyte Esterase: 3+ leu/uL Mucous: Present      Epithelial Cells: 6 - 10/hpf      Yeast: NS (Not Seen)      Sperm: Not Present    ASSESSMENT:      ICD-10 Details  1.  Left ureteral calculus requiring stenting 05/04/2022 Plan/recommendation.  Scheduled for cystoscopy left ureteroscopy with laser lithotripsy and left JJ stent exchange.  Risks and benefits discussed including risk of injury to bladder or ureter inability to access stone, patient understands risks agrees to proceed.

## 2022-05-20 ENCOUNTER — Ambulatory Visit (HOSPITAL_COMMUNITY): Payer: Medicare Other | Admitting: Physician Assistant

## 2022-05-20 ENCOUNTER — Ambulatory Visit (HOSPITAL_COMMUNITY): Payer: Medicare Other

## 2022-05-20 ENCOUNTER — Ambulatory Visit (HOSPITAL_COMMUNITY)
Admission: RE | Admit: 2022-05-20 | Discharge: 2022-05-20 | Disposition: A | Discharge status: Home / Self Care | Payer: Medicare Other | Attending: Urology | Admitting: Urology

## 2022-05-20 ENCOUNTER — Encounter (HOSPITAL_COMMUNITY): Payer: Self-pay | Admitting: Urology

## 2022-05-20 ENCOUNTER — Encounter (HOSPITAL_COMMUNITY)
Admission: RE | Disposition: A | Discharge status: Home / Self Care | Payer: Self-pay | Source: Home / Self Care | Attending: Urology

## 2022-05-20 ENCOUNTER — Ambulatory Visit (HOSPITAL_BASED_OUTPATIENT_CLINIC_OR_DEPARTMENT_OTHER): Payer: Medicare Other | Admitting: Anesthesiology

## 2022-05-20 DIAGNOSIS — G4730 Sleep apnea, unspecified: Secondary | ICD-10-CM | POA: Insufficient documentation

## 2022-05-20 DIAGNOSIS — I10 Essential (primary) hypertension: Secondary | ICD-10-CM | POA: Diagnosis not present

## 2022-05-20 DIAGNOSIS — Z6841 Body Mass Index (BMI) 40.0 and over, adult: Secondary | ICD-10-CM | POA: Insufficient documentation

## 2022-05-20 DIAGNOSIS — Z923 Personal history of irradiation: Secondary | ICD-10-CM | POA: Diagnosis not present

## 2022-05-20 DIAGNOSIS — N289 Disorder of kidney and ureter, unspecified: Secondary | ICD-10-CM | POA: Diagnosis not present

## 2022-05-20 DIAGNOSIS — K219 Gastro-esophageal reflux disease without esophagitis: Secondary | ICD-10-CM | POA: Insufficient documentation

## 2022-05-20 DIAGNOSIS — Z87891 Personal history of nicotine dependence: Secondary | ICD-10-CM | POA: Insufficient documentation

## 2022-05-20 DIAGNOSIS — N202 Calculus of kidney with calculus of ureter: Secondary | ICD-10-CM | POA: Diagnosis not present

## 2022-05-20 DIAGNOSIS — E119 Type 2 diabetes mellitus without complications: Secondary | ICD-10-CM

## 2022-05-20 DIAGNOSIS — N132 Hydronephrosis with renal and ureteral calculous obstruction: Secondary | ICD-10-CM | POA: Insufficient documentation

## 2022-05-20 DIAGNOSIS — Z8546 Personal history of malignant neoplasm of prostate: Secondary | ICD-10-CM | POA: Diagnosis not present

## 2022-05-20 DIAGNOSIS — N201 Calculus of ureter: Secondary | ICD-10-CM | POA: Diagnosis not present

## 2022-05-20 HISTORY — PX: CYSTOSCOPY/URETEROSCOPY/HOLMIUM LASER/STENT PLACEMENT: SHX6546

## 2022-05-20 LAB — GLUCOSE, CAPILLARY
Glucose-Capillary: 147 mg/dL — ABNORMAL HIGH (ref 70–99)
Glucose-Capillary: 125 mg/dL — ABNORMAL HIGH (ref 70–99)

## 2022-05-20 SURGERY — CYSTOSCOPY/URETEROSCOPY/HOLMIUM LASER/STENT PLACEMENT
Anesthesia: General | Site: Bladder | Laterality: Left

## 2022-05-20 MED ORDER — PHENYLEPHRINE 80 MCG/ML (10ML) SYRINGE FOR IV PUSH (FOR BLOOD PRESSURE SUPPORT)
PREFILLED_SYRINGE | INTRAVENOUS | Status: DC | PRN
  Administered 2022-05-20 (×3): 160 ug via INTRAVENOUS

## 2022-05-20 MED ORDER — OXYCODONE HCL 5 MG/5ML PO SOLN: 5.0000 mg | Freq: Once | ORAL | Status: AC | PRN

## 2022-05-20 MED ORDER — FENTANYL CITRATE PF 50 MCG/ML IJ SOSY
PREFILLED_SYRINGE | INTRAMUSCULAR | Status: AC
  Filled 2022-05-20: qty 1

## 2022-05-20 MED ORDER — SODIUM CHLORIDE 0.9 % IR SOLN
Status: DC | PRN
  Administered 2022-05-20: 3000 mL

## 2022-05-20 MED ORDER — PROPOFOL 10 MG/ML IV BOLUS
INTRAVENOUS | Status: AC
  Filled 2022-05-20: qty 20

## 2022-05-20 MED ORDER — TRAMADOL HCL 50 MG PO TABS: 50.0000 mg | ORAL_TABLET | Freq: Four times a day (QID) | ORAL | 0 refills | Status: AC | PRN

## 2022-05-20 MED ORDER — DEXAMETHASONE SODIUM PHOSPHATE 10 MG/ML IJ SOLN
INTRAMUSCULAR | Status: DC | PRN
  Administered 2022-05-20: 4 mg via INTRAVENOUS

## 2022-05-20 MED ORDER — PROPOFOL 10 MG/ML IV BOLUS
INTRAVENOUS | Status: DC | PRN
  Administered 2022-05-20: 50 mg via INTRAVENOUS
  Administered 2022-05-20: 150 mg via INTRAVENOUS
  Administered 2022-05-20: 200 mg via INTRAVENOUS

## 2022-05-20 MED ORDER — ORAL CARE MOUTH RINSE: 15.0000 mL | Freq: Once | OROMUCOSAL | Status: DC

## 2022-05-20 MED ORDER — FENTANYL CITRATE (PF) 100 MCG/2ML IJ SOLN
INTRAMUSCULAR | Status: DC | PRN
  Administered 2022-05-20 (×4): 25 ug via INTRAVENOUS

## 2022-05-20 MED ORDER — LACTATED RINGERS IV SOLN: INTRAVENOUS | Status: DC

## 2022-05-20 MED ORDER — DEXAMETHASONE SODIUM PHOSPHATE 4 MG/ML IJ SOLN: INTRAMUSCULAR | Status: DC | PRN

## 2022-05-20 MED ORDER — OXYCODONE HCL 5 MG PO TABS
5.0000 mg | ORAL_TABLET | Freq: Once | ORAL | Status: AC | PRN
  Administered 2022-05-20: 5 mg via ORAL

## 2022-05-20 MED ORDER — ONDANSETRON HCL 4 MG/2ML IJ SOLN: 4.0000 mg | Freq: Once | INTRAMUSCULAR | Status: DC | PRN

## 2022-05-20 MED ORDER — PHENYLEPHRINE 80 MCG/ML (10ML) SYRINGE FOR IV PUSH (FOR BLOOD PRESSURE SUPPORT)
PREFILLED_SYRINGE | INTRAVENOUS | Status: AC
  Filled 2022-05-20: qty 10

## 2022-05-20 MED ORDER — ACETAMINOPHEN 500 MG PO TABS
1000.0000 mg | ORAL_TABLET | Freq: Once | ORAL | Status: AC
  Administered 2022-05-20: 1000 mg via ORAL
  Filled 2022-05-20: qty 2

## 2022-05-20 MED ORDER — LIDOCAINE HCL (PF) 2 % IJ SOLN
INTRAMUSCULAR | Status: DC | PRN
  Administered 2022-05-20: 100 mg via INTRADERMAL

## 2022-05-20 MED ORDER — IOHEXOL 300 MG/ML  SOLN
INTRAMUSCULAR | Status: DC | PRN
  Administered 2022-05-20: 5 mL

## 2022-05-20 MED ORDER — LIDOCAINE 2% (20 MG/ML) 5 ML SYRINGE
INTRAMUSCULAR | Status: DC | PRN
  Administered 2022-05-20: 100 mg via INTRAVENOUS

## 2022-05-20 MED ORDER — FENTANYL CITRATE PF 50 MCG/ML IJ SOSY
25.0000 ug | PREFILLED_SYRINGE | INTRAMUSCULAR | Status: DC | PRN
  Administered 2022-05-20 (×3): 50 ug via INTRAVENOUS

## 2022-05-20 MED ORDER — FENTANYL CITRATE (PF) 100 MCG/2ML IJ SOLN
INTRAMUSCULAR | Status: AC
  Filled 2022-05-20: qty 2

## 2022-05-20 MED ORDER — CHLORHEXIDINE GLUCONATE 0.12 % MT SOLN: 15.0000 mL | Freq: Once | OROMUCOSAL | Status: DC

## 2022-05-20 MED ORDER — AMISULPRIDE (ANTIEMETIC) 5 MG/2ML IV SOLN: 10.0000 mg | Freq: Once | INTRAVENOUS | Status: DC | PRN

## 2022-05-20 MED ORDER — CEFAZOLIN SODIUM 1 G IJ SOLR
INTRAMUSCULAR | Status: AC
  Filled 2022-05-20: qty 20

## 2022-05-20 MED ORDER — OXYCODONE HCL 5 MG PO TABS
ORAL_TABLET | ORAL | Status: AC
  Filled 2022-05-20: qty 1

## 2022-05-20 MED ORDER — CEFAZOLIN SODIUM-DEXTROSE 2-3 GM-%(50ML) IV SOLR
INTRAVENOUS | Status: DC | PRN
  Administered 2022-05-20: 2 g via INTRAVENOUS

## 2022-05-20 MED ORDER — ONDANSETRON HCL 4 MG/2ML IJ SOLN
INTRAMUSCULAR | Status: DC | PRN
  Administered 2022-05-20: 4 mg via INTRAVENOUS

## 2022-05-20 SURGICAL SUPPLY — 24 items
BAG URO CATCHER STRL LF (MISCELLANEOUS) ×1 IMPLANT
BASKET ZERO TIP NITINOL 2.4FR (BASKET) IMPLANT
BSKT STON RTRVL ZERO TP 2.4FR (BASKET)
BULB IRRIG PATHFIND (MISCELLANEOUS) ×1 IMPLANT
CATH URETL OPEN 5X70 (CATHETERS) IMPLANT
CLOTH BEACON ORANGE TIMEOUT ST (SAFETY) ×1 IMPLANT
EXTRACTOR STONE 1.7FRX115CM (UROLOGICAL SUPPLIES) IMPLANT
GLOVE SURG LX STRL 7.5 STRW (GLOVE) ×1 IMPLANT
GOWN STRL REUS W/ TWL XL LVL3 (GOWN DISPOSABLE) ×1 IMPLANT
GOWN STRL REUS W/TWL XL LVL3 (GOWN DISPOSABLE) ×1
GUIDEWIRE ANG ZIPWIRE 038X150 (WIRE) IMPLANT
GUIDEWIRE STR DUAL SENSOR (WIRE) ×1 IMPLANT
KIT TURNOVER KIT A (KITS) IMPLANT
LASER FIB FLEXIVA PULSE ID 365 (Laser) IMPLANT
MANIFOLD NEPTUNE II (INSTRUMENTS) ×1 IMPLANT
PACK CYSTO (CUSTOM PROCEDURE TRAY) ×1 IMPLANT
SHEATH NAVIGATOR HD 11/13X36 (SHEATH) IMPLANT
SHEATH NAVIGATOR HD 12/14X36 (SHEATH) IMPLANT
STENT URET 6FRX24 CONTOUR (STENTS) IMPLANT
SYR 20ML LL LF (SYRINGE) ×1 IMPLANT
TRACTIP FLEXIVA PULS ID 200XHI (Laser) IMPLANT
TRACTIP FLEXIVA PULSE ID 200 (Laser) ×1
TUBING CONNECTING 10 (TUBING) ×1 IMPLANT
TUBING UROLOGY SET (TUBING) ×1 IMPLANT

## 2022-05-20 NOTE — Anesthesia Procedure Notes (Addendum)
Procedure Name: LMA Insertion Date/Time: 05/20/2022 11:57 AM  Performed by: Deliah Boston, CRNAPre-anesthesia Checklist: Patient identified, Emergency Drugs available, Suction available and Patient being monitored Patient Re-evaluated:Patient Re-evaluated prior to induction Oxygen Delivery Method: Circle system utilized Preoxygenation: Pre-oxygenation with 100% oxygen Induction Type: IV induction Ventilation: Two handed mask ventilation required and Oral airway inserted - appropriate to patient size LMA: LMA with gastric port inserted LMA Size: 4.0 Number of attempts: 1 Placement Confirmation: positive ETCO2 and breath sounds checked- equal and bilateral Tube secured with: Tape Dental Injury: Teeth and Oropharynx as per pre-operative assessment

## 2022-05-20 NOTE — Anesthesia Preprocedure Evaluation (Signed)
Anesthesia Evaluation  Patient identified by MRN, date of birth, ID band Patient awake    Reviewed: Allergy & Precautions, NPO status , Patient's Chart, lab work & pertinent test results  History of Anesthesia Complications Negative for: history of anesthetic complications  Airway Mallampati: II  TM Distance: >3 FB Neck ROM: Full    Dental  (+) Dental Advisory Given, Teeth Intact   Pulmonary sleep apnea , former smoker, PE   Pulmonary exam normal        Cardiovascular hypertension, Normal cardiovascular exam     Neuro/Psych negative neurological ROS     GI/Hepatic Neg liver ROS,GERD  ,,  Endo/Other  diabetes, Type 2  Morbid obesity  Renal/GU Renal InsufficiencyRenal disease (Ureteral stone)Cr 1.75   Prostate cancer    Musculoskeletal negative musculoskeletal ROS (+)    Abdominal   Peds  Hematology negative hematology ROS (+)   Anesthesia Other Findings   Reproductive/Obstetrics                             Anesthesia Physical Anesthesia Plan  ASA: 3  Anesthesia Plan: General   Post-op Pain Management: Tylenol PO (pre-op)*   Induction: Intravenous  PONV Risk Score and Plan: 2 and Ondansetron, Dexamethasone, Midazolam and Treatment may vary due to age or medical condition  Airway Management Planned: LMA  Additional Equipment: None  Intra-op Plan:   Post-operative Plan: Extubation in OR  Informed Consent: I have reviewed the patients History and Physical, chart, labs and discussed the procedure including the risks, benefits and alternatives for the proposed anesthesia with the patient or authorized representative who has indicated his/her understanding and acceptance.     Dental advisory given  Plan Discussed with:   Anesthesia Plan Comments:        Anesthesia Quick Evaluation

## 2022-05-20 NOTE — Op Note (Signed)
Preoperative diagnosis:  1.  Left distal ureteral calculus and left renal calculus  Postoperative diagnosis: 1.  Same  Procedure(s): 1.  Cystoscopy with left retrograde pyelogram with intraoperative interpretation, left semirigid ureteroscopy with laser lithotripsy and stone extraction of distal ureteral calculus, flexible ureteroscopy and pyeloscopy with laser lithotripsy of left middle calyceal stone, left JJ stent exchange  Surgeon: Dr. Harold Barban  Anesthesia: General  Complications: None  EBL: Minimal  Specimens: None  Disposition of specimens: Not applicable  Intraoperative findings: 6 mm left distal ureteral calculus fragmented utilizing laser and extracted with an engage basket completely.  No proximal ureteral calculi noted.  Flexible ureteroscopy performed up to the kidney 10 to 12 mm left middle calyceal stone noted.  Laser fiber utilized to dust the fragment into the small fragments no larger than tip of the laser fiber.  6 [place] by 24 cm Percuflex plus soft contour stent placed without tether  Indication: 71 year old white male underwent recent cystoscopy insertion of urgent left JJ stent for left distal ureteral calculus.  Has known large left renal calculus as well.  Presents at this time to go left ureteroscopy and extraction of left distal stone and also laser lithotripsy of left renal calculus.  Description of procedure:  After obtaining form consent the patient was taken the major cystoscopy suite placed under general anesthesia.  Placed in dorsolithotomy position genitalia prepped and draped in usual sterile fashion.  Proper pause and timeout was performed for site of procedure.  53 French cystoscope was advanced in the bladder without difficulty.  Patient had bilobar prostatic hypertrophy and slight elevation of the median lobe as well.  The left JJ stent was identified grasped with alligator graspers and removed.  A 6.4 French semirigid ureteroscope was then  advanced into the bladder without difficulty.  This was manipulated easily inside the patulous left ureteral orifice and approximately 2 cm approximately a 5 to 6 mm stone was encountered.  It was too large to extract with basket.  The 240 m holmium laser fiber was utilized to fragment this into 2 or 3 smaller fragments were individually extracted utilizing engage basket.  Final look revealed no remaining fragments.  The scope was advanced along the length of the ureter up to the UPJ area and no stones were encountered.  Sensor wire was passed through the ureteroscope coil in the renal pelvis and the scope was removed.  The 11-13 French ureteral access sheath was then placed first utilizing the obturator to dilate over the wire with mild resistance.  The obturator and access sheath were then passed with mild amount of resistance but then dilated nicely into the proximal ureter.  Wire was removed the digital scope was advanced through the access sheath and up to the renal pelvis without difficulty.  Calyces were systematically examined.  Stone was noted in the left middle calyx was approximately 1 cm in size.  Stone was very hard.  I tried the fragmentation setting and did not do much to the stone.  I utilized the dusting setting at 50/2 and approximately 30 minutes were utilized to dust the stone out to into fragments that were no larger than the tip of the laser fiber.  I then dispersed several each fragments utilizing the Pathfinder.  Retrograde pyelogram through the scope revealed no obvious filling defects no extravasation of contrast.  Wire was passed through the cystoscope and coiled in the upper pole calyx.  The flexible scope and access sheath were then backed down the  ureter visualizing this along its length while leaving the guidewire in place.  There is no evidence of ureteral injury.  Wire was faxed at the cystoscope and a 6 [place] by 24 cm Percuflex plus soft Contour stent was placed leaving a proximal  coil in the upper pole calyx distal coil in the bladder.  There is no tether left.  Bladder was emptied procedure terminated.  He was awakened from anesthesia and taken back to recovery in stable condition.  No immediate complication from the procedure.

## 2022-05-20 NOTE — Anesthesia Postprocedure Evaluation (Signed)
Anesthesia Post Note  Patient: Joseph Pham  Procedure(s) Performed: CYSTOSCOPY/URETEROSCOPY/HOLMIUM LASER/STENT EXCHANGE (Left: Bladder)     Patient location during evaluation: PACU Anesthesia Type: General Level of consciousness: awake and alert Pain management: pain level controlled Vital Signs Assessment: post-procedure vital signs reviewed and stable Respiratory status: spontaneous breathing, nonlabored ventilation and respiratory function stable Cardiovascular status: blood pressure returned to baseline and stable Postop Assessment: no apparent nausea or vomiting Anesthetic complications: no   No notable events documented.  Last Vitals:  Vitals:   05/20/22 1330 05/20/22 1345  BP: 139/74 133/77  Pulse: 60 61  Resp: 18 14  Temp: 36.8 C   SpO2: 100% 100%    Last Pain:  Vitals:   05/20/22 1355  TempSrc:   PainSc: 8                  Lidia Collum

## 2022-05-20 NOTE — Transfer of Care (Signed)
Immediate Anesthesia Transfer of Care Note  Patient: Joseph Pham  Procedure(s) Performed: Procedure(s): CYSTOSCOPY/URETEROSCOPY/HOLMIUM LASER/STENT EXCHANGE (Left)  Patient Location: PACU  Anesthesia Type:General  Level of Consciousness: Patient easily awoken, sedated, comfortable, cooperative, following commands, responds to stimulation.   Airway & Oxygen Therapy: Patient spontaneously breathing, ventilating well, oxygen via simple oxygen mask.  Post-op Assessment: Report given to PACU RN, vital signs reviewed and stable, moving all extremities.   Post vital signs: Reviewed and stable.  Complications: No apparent anesthesia complications Last Vitals:  Vitals Value Taken Time  BP 139/74 05/20/22 1330  Temp    Pulse 63 05/20/22 1332  Resp 19 05/20/22 1332  SpO2 100 % 05/20/22 1332  Vitals shown include unvalidated device data.  Last Pain:  Vitals:   05/20/22 0943  TempSrc:   PainSc: 0-No pain      Patients Stated Pain Goal: 4 (22/29/79 8921)  Complications: No notable events documented.

## 2022-05-21 ENCOUNTER — Encounter (HOSPITAL_COMMUNITY): Payer: Self-pay | Admitting: Urology

## 2022-05-30 ENCOUNTER — Telehealth: Payer: Self-pay

## 2022-05-30 NOTE — Patient Outreach (Signed)
  Care Coordination   Initial Visit Note   05/30/2022 Name: Joseph Pham MRN: [id number] DOB: 1950-11-03  Joseph Pham is a 71 y.o. year old male who sees Redding, Angelique Blonder, MD for primary care. I spoke with  Joseph Pham by phone today.  What matters to the patients health and wellness today?  Placed call to patient to review and offer [facility] care coordination program.  Patient reports that he is doing well. Reviewed recent surgery and stent placement for 3 kidney stones.     Goals Addressed               This Visit's Progress     COMPLETED: Concerned about kidney function (pt-stated)        Care Coordination Interventions: Reviewed medications with patient and discussed importance of compliance    Reviewed scheduled/upcoming provider appointments including    Advised patient to discuss kidney function  with provider    Reviewed recent labs from the last 2 months Encouraged patient to inquire about updated labs.        SDOH assessments and interventions completed:  No     Care Coordination Interventions:  Yes, provided   Follow up plan: No further intervention required.   Encounter Outcome:  Pt. Visit Completed   Joseph Rand, RN, BSN, CEN [place] Coordinator [phone]

## 2022-06-03 DIAGNOSIS — N202 Calculus of kidney with calculus of ureter: Secondary | ICD-10-CM | POA: Diagnosis not present

## 2022-06-05 DIAGNOSIS — E1122 Type 2 diabetes mellitus with diabetic chronic kidney disease: Secondary | ICD-10-CM | POA: Diagnosis not present

## 2022-06-05 DIAGNOSIS — N200 Calculus of kidney: Secondary | ICD-10-CM | POA: Diagnosis not present

## 2022-06-05 DIAGNOSIS — Z6841 Body Mass Index (BMI) 40.0 and over, adult: Secondary | ICD-10-CM | POA: Diagnosis not present

## 2022-06-26 DIAGNOSIS — J019 Acute sinusitis, unspecified: Secondary | ICD-10-CM | POA: Diagnosis not present

## 2022-07-18 DIAGNOSIS — J329 Chronic sinusitis, unspecified: Secondary | ICD-10-CM | POA: Diagnosis not present

## 2022-07-25 DIAGNOSIS — N130 Hydronephrosis with ureteropelvic junction obstruction: Secondary | ICD-10-CM | POA: Diagnosis not present

## 2022-07-25 DIAGNOSIS — Z8546 Personal history of malignant neoplasm of prostate: Secondary | ICD-10-CM | POA: Diagnosis not present

## 2022-07-25 DIAGNOSIS — N201 Calculus of ureter: Secondary | ICD-10-CM | POA: Diagnosis not present

## 2022-07-25 DIAGNOSIS — Z125 Encounter for screening for malignant neoplasm of prostate: Secondary | ICD-10-CM | POA: Diagnosis not present

## 2022-07-29 DIAGNOSIS — N200 Calculus of kidney: Secondary | ICD-10-CM | POA: Diagnosis not present

## 2022-07-29 DIAGNOSIS — N1339 Other hydronephrosis: Secondary | ICD-10-CM | POA: Diagnosis not present

## 2022-07-29 DIAGNOSIS — N130 Hydronephrosis with ureteropelvic junction obstruction: Secondary | ICD-10-CM | POA: Diagnosis not present

## 2022-07-29 DIAGNOSIS — N281 Cyst of kidney, acquired: Secondary | ICD-10-CM | POA: Diagnosis not present

## 2022-07-29 DIAGNOSIS — N1330 Unspecified hydronephrosis: Secondary | ICD-10-CM | POA: Diagnosis not present

## 2022-08-25 DIAGNOSIS — G4733 Obstructive sleep apnea (adult) (pediatric): Secondary | ICD-10-CM | POA: Diagnosis not present

## 2022-08-25 DIAGNOSIS — I10 Essential (primary) hypertension: Secondary | ICD-10-CM | POA: Diagnosis not present

## 2022-08-25 DIAGNOSIS — E1129 Type 2 diabetes mellitus with other diabetic kidney complication: Secondary | ICD-10-CM | POA: Diagnosis not present

## 2022-08-25 DIAGNOSIS — Z6841 Body Mass Index (BMI) 40.0 and over, adult: Secondary | ICD-10-CM | POA: Diagnosis not present

## 2022-10-06 DIAGNOSIS — J209 Acute bronchitis, unspecified: Secondary | ICD-10-CM | POA: Diagnosis not present

## 2022-10-06 DIAGNOSIS — R0981 Nasal congestion: Secondary | ICD-10-CM | POA: Diagnosis not present

## 2022-10-06 DIAGNOSIS — R051 Acute cough: Secondary | ICD-10-CM | POA: Diagnosis not present

## 2022-10-09 DIAGNOSIS — Z7984 Long term (current) use of oral hypoglycemic drugs: Secondary | ICD-10-CM | POA: Diagnosis not present

## 2022-10-09 DIAGNOSIS — E119 Type 2 diabetes mellitus without complications: Secondary | ICD-10-CM | POA: Diagnosis not present

## 2022-10-09 DIAGNOSIS — H524 Presbyopia: Secondary | ICD-10-CM | POA: Diagnosis not present

## 2022-10-09 DIAGNOSIS — H5203 Hypermetropia, bilateral: Secondary | ICD-10-CM | POA: Diagnosis not present

## 2022-10-09 DIAGNOSIS — H52223 Regular astigmatism, bilateral: Secondary | ICD-10-CM | POA: Diagnosis not present

## 2022-10-09 DIAGNOSIS — Z9841 Cataract extraction status, right eye: Secondary | ICD-10-CM | POA: Diagnosis not present

## 2022-10-09 DIAGNOSIS — H26492 Other secondary cataract, left eye: Secondary | ICD-10-CM | POA: Diagnosis not present

## 2022-10-09 DIAGNOSIS — Z961 Presence of intraocular lens: Secondary | ICD-10-CM | POA: Diagnosis not present

## 2022-10-13 DIAGNOSIS — C61 Malignant neoplasm of prostate: Secondary | ICD-10-CM | POA: Diagnosis not present

## 2022-10-13 DIAGNOSIS — R7302 Impaired glucose tolerance (oral): Secondary | ICD-10-CM | POA: Diagnosis not present

## 2022-10-13 DIAGNOSIS — Z86711 Personal history of pulmonary embolism: Secondary | ICD-10-CM | POA: Diagnosis not present

## 2022-10-13 DIAGNOSIS — I129 Hypertensive chronic kidney disease with stage 1 through stage 4 chronic kidney disease, or unspecified chronic kidney disease: Secondary | ICD-10-CM | POA: Diagnosis not present

## 2022-10-13 DIAGNOSIS — N1832 Chronic kidney disease, stage 3b: Secondary | ICD-10-CM | POA: Diagnosis not present

## 2022-10-13 DIAGNOSIS — N200 Calculus of kidney: Secondary | ICD-10-CM | POA: Diagnosis not present

## 2022-10-13 DIAGNOSIS — K2100 Gastro-esophageal reflux disease with esophagitis, without bleeding: Secondary | ICD-10-CM | POA: Diagnosis not present

## 2022-11-04 DIAGNOSIS — N200 Calculus of kidney: Secondary | ICD-10-CM | POA: Diagnosis not present

## 2022-12-09 DIAGNOSIS — N1832 Chronic kidney disease, stage 3b: Secondary | ICD-10-CM | POA: Diagnosis not present

## 2022-12-22 ENCOUNTER — Other Ambulatory Visit: Payer: Self-pay

## 2022-12-22 DIAGNOSIS — I7789 Other specified disorders of arteries and arterioles: Secondary | ICD-10-CM

## 2022-12-22 DIAGNOSIS — I428 Other cardiomyopathies: Secondary | ICD-10-CM

## 2023-01-08 DIAGNOSIS — H9193 Unspecified hearing loss, bilateral: Secondary | ICD-10-CM | POA: Diagnosis not present

## 2023-01-08 DIAGNOSIS — J358 Other chronic diseases of tonsils and adenoids: Secondary | ICD-10-CM | POA: Diagnosis not present

## 2023-01-08 DIAGNOSIS — H903 Sensorineural hearing loss, bilateral: Secondary | ICD-10-CM | POA: Diagnosis not present

## 2023-01-08 DIAGNOSIS — J310 Chronic rhinitis: Secondary | ICD-10-CM | POA: Diagnosis not present

## 2023-01-08 DIAGNOSIS — H9313 Tinnitus, bilateral: Secondary | ICD-10-CM | POA: Diagnosis not present

## 2023-01-14 ENCOUNTER — Ambulatory Visit (HOSPITAL_COMMUNITY)
Admission: RE | Admit: 2023-01-14 | Discharge: 2023-01-14 | Disposition: A | Discharge status: Home / Self Care | Payer: Medicare Other | Source: Ambulatory Visit | Attending: Cardiology | Admitting: Cardiology

## 2023-01-14 DIAGNOSIS — I7121 Aneurysm of the ascending aorta, without rupture: Secondary | ICD-10-CM | POA: Diagnosis not present

## 2023-01-14 DIAGNOSIS — I428 Other cardiomyopathies: Secondary | ICD-10-CM | POA: Diagnosis not present

## 2023-01-14 DIAGNOSIS — I7789 Other specified disorders of arteries and arterioles: Secondary | ICD-10-CM | POA: Insufficient documentation

## 2023-01-14 DIAGNOSIS — I700 Atherosclerosis of aorta: Secondary | ICD-10-CM | POA: Diagnosis not present

## 2023-01-21 ENCOUNTER — Other Ambulatory Visit: Payer: Self-pay

## 2023-01-21 DIAGNOSIS — I714 Abdominal aortic aneurysm, without rupture, unspecified: Secondary | ICD-10-CM

## 2023-01-21 DIAGNOSIS — I7789 Other specified disorders of arteries and arterioles: Secondary | ICD-10-CM

## 2023-01-21 DIAGNOSIS — K219 Gastro-esophageal reflux disease without esophagitis: Secondary | ICD-10-CM

## 2023-01-22 DIAGNOSIS — Z8546 Personal history of malignant neoplasm of prostate: Secondary | ICD-10-CM | POA: Diagnosis not present

## 2023-01-22 DIAGNOSIS — N200 Calculus of kidney: Secondary | ICD-10-CM | POA: Diagnosis not present

## 2023-01-27 ENCOUNTER — Ambulatory Visit: Payer: Medicare Other | Admitting: Pulmonary Disease

## 2023-01-27 ENCOUNTER — Encounter: Payer: Self-pay | Admitting: Pulmonary Disease

## 2023-01-27 VITALS — BP 124/74 | HR 60 | Ht 65.5 in | Wt 250.2 lb

## 2023-01-27 DIAGNOSIS — G4733 Obstructive sleep apnea (adult) (pediatric): Secondary | ICD-10-CM

## 2023-01-27 NOTE — Patient Instructions (Signed)
I will see you a year from now  Continue using your CPAP on a nightly basis  Continue trazodone to help you get sleep  You may call us if you need to try something else for your sleep

## 2023-01-27 NOTE — Progress Notes (Signed)
Joseph Pham    [id number]    Jun 20, 1950  Primary Care Physician:Redding, Valrie Hart, MD  Referring Physician: Noni Saupe, MD [phone] [place],  [place] [address]  Chief complaint:   Patient is in for evaluation of obstructive sleep apnea  HPI:  Continues to use CPAP nightly  Benefiting from CPAP Waking up feeling like he had a good night rest okay No significant sleepiness during the day  Has been using CPAP for about 15 years Sleeps well with CPAP  No significant nasal stuffiness or congestion  Wakes up feeling like he is at a good nights rest Usually goes to bed about 10-11 PM About 20 minutes to fall asleep No significant awakenings Final wake up time between 5 and 6 AM  Quit smoking in 1995 Weight has remained stable no significant changes to his health, does have a history of hypertension, diabetes, hypercholesterolemia  History of back surgery  Unable to be as active as he wants to be because of chronic back pain  He is on trazodone now, Ambien was prescribed during the last visit but felt it was too strong for him  Outpatient Encounter Medications as of 01/27/2023  Medication Sig   potassium citrate (UROCIT-K) 10 MEQ (1080 MG) SR tablet Take 20 mEq by mouth 2 (two) times daily.   atorvastatin (LIPITOR) 10 MG tablet Take 10 mg by mouth at bedtime.   Carboxymethylcellul-Glycerin (REFRESH OPTIVE PF OP) Place 1 drop into both eyes 2 (two) times daily as needed (dry eyes).   cholecalciferol (VITAMIN D3) 25 MCG (1000 UT) tablet Take 1,000 Units by mouth daily.   famotidine (PEPCID) 40 MG tablet Take 40 mg by mouth at bedtime.    fexofenadine (ALLEGRA) 180 MG tablet Take 180 mg by mouth daily.   fluticasone (FLONASE) 50 MCG/ACT nasal spray Place 2 sprays into the nose daily as needed for allergies.    glipiZIDE 2.5 MG TABS Take 2.5 mg by mouth daily before breakfast.   lisinopril (PRINIVIL,ZESTRIL) 10 MG tablet Take 10  mg by mouth daily.    Melatonin 5 MG CAPS Take 5 mg by mouth at bedtime.   omega-3 acid ethyl esters (LOVAZA) 1 g capsule Take 4 g by mouth at bedtime.   omeprazole (PRILOSEC) 20 MG capsule Take 20 mg by mouth daily.   Rivaroxaban (XARELTO) 15 MG TABS tablet Take 1 tablet (15 mg total) by mouth daily.   traMADol (ULTRAM) 50 MG tablet Take 50 mg by mouth 2 (two) times daily.   traMADol (ULTRAM) 50 MG tablet Take 1 tablet (50 mg total) by mouth every 6 (six) hours as needed.   traZODone (DESYREL) 100 MG tablet Take 50 mg by mouth at bedtime.   No facility-administered encounter medications on file as of 01/27/2023.    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   Overview:  43 mm ascending, 11/22/15  Bilateral hearing loss 07/19/2019   Cataract    forming bilaterally    Chest pain    Chest pain in adult 04/13/2018   Chronic tonsillitis 07/19/2019   CKD (chronic kidney disease), 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   Complication of anesthesia    low heart rate during lithotripsy procedure   Deviated septum 07/19/2019   Diabetes (HCC) 03/16/2017   Essential hypertension 10/14/2015   Family history of adverse reaction to anesthesia    sister coded after surgery   GERD (gastroesophageal reflux disease)    Heart murmur    Hematuria    Hemorrhoid    History of kidney stones    Hyperlipidemia    Hypertension    Impingement of left ankle joint 03/06/2021   Kidney stone  03/16/2017   Lumbar radiculopathy 03/16/2017   Nasal turbinate hypertrophy 07/19/2019   OSA (obstructive sleep apnea)    OSA on CPAP    Paronychia 08/16/2015   PE (pulmonary thromboembolism) (HCC) 02/2019   Posterior tibialis tendinitis of both lower extremities 02/02/2020   Preoperative cardiovascular examination 08/19/2016   Pronation deformity of both feet 02/02/2020   Prostate cancer (HCC) 03/16/2017   Right flank pain    Right ureteral stone    Skin cancer    Sleep apnea    Tibialis posterior tendinitis 02/11/2021   Tinnitus, bilateral 08/31/2019   Type 2 diabetes, diet controlled (HCC)    off Metformin > 1 yr now as of PV 02-23-18   Ureteral calculus, right 03/15/2012   UTI (urinary tract infection) 04/09/2022   Wears glasses     Past Surgical History:  Procedure Laterality Date   BACK SURGERY  2019   rod and screws    BILATERAL INGUINAL HERNIA REPAIR  1989   CARDIAC CATHETERIZATION  01-11-2004   NO EVIDENCE SIGNIFICANT EPICARDIAL FLOW LIMITING 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   CARPAL TUNNEL RELEASE Left 06/2016   CARPAL TUNNEL RELEASE Right 03/2017   COLONOSCOPY     last with medoff unsure when    CYSTOSCOPY WITH RETROGRADE PYELOGRAM, URETEROSCOPY AND STENT PLACEMENT Left 04/09/2022   Procedure: CYSTOSCOPY WITH RETROGRADE PYELOGRAM AND STENT PLACEMENT;  Surgeon: Crista Elliot, MD;  Location: [facility];  Service: Urology;  Laterality: Left;   CYSTOSCOPY WITH RETROGRADE PYELOGRAM, URETEROSCOPY AND STENT PLACEMENT Left 05/04/2022   Procedure: CYSTOSCOPY WITH RETROGRADE PYELOGRAM, URETEROSCOPY AND STENT PLACEMENT;  Surgeon: Rene Paci, MD;  Location: [facility];  Service: Urology;  Laterality: Left;   CYSTOSCOPY WITH URETEROSCOPY AND STENT PLACEMENT Right 10/28/2013   Procedure: RIGHT URETEROSCOPY AND STENT PLACEMENT;  Surgeon: Garnett Farm, MD;  Location: [facility];  Service: Urology;  Laterality: Right;    CYSTOSCOPY/URETEROSCOPY/HOLMIUM LASER/STENT PLACEMENT Left 09/06/2020   Procedure: CYSTOSCOPY/POSSIBLE URETEROSCOPY/HOLMIUM LASER/STENT PLACEMENT;  Surgeon: Belva Agee, MD;  Location: [facility];  Service: Urology;  Laterality: Left;  30 MINS   CYSTOSCOPY/URETEROSCOPY/HOLMIUM LASER/STENT PLACEMENT Left 04/22/2022   Procedure: CYSTOSCOPY/URETEROSCOPY/HOLMIUM LASER/STENT REPLACEMENT;  Surgeon: Belva Agee, MD;  Location: [facility];  Service: Urology;  Laterality: Left;  1 HR   CYSTOSCOPY/URETEROSCOPY/HOLMIUM LASER/STENT PLACEMENT Left 05/20/2022   Procedure: CYSTOSCOPY/URETEROSCOPY/HOLMIUM LASER/STENT EXCHANGE;  Surgeon: Belva Agee, MD;  Location: [facility];  Service: Urology;  Laterality: Left;   EXTRACORPOREAL SHOCK WAVE LITHOTRIPSY  02-08-2009   HOLMIUM LASER APPLICATION Right 10/28/2013   Procedure: HOLMIUM LASER LITHO;  Surgeon: Garnett Farm, MD;  Location: [facility];  Service: Urology;  Laterality: Right;  LEFT KNEE SURG.  1993  &  NOV 2011   LEFT URETEROSCOPIC STONE EXTRACTION  02-23-2009   POLYPECTOMY  2014   1 polyp per dr Urban Gibson notes- no surg path to determine polyps type but was a 5 yr recall    RADIOACTIVE SEED IMPLANT  08/08/2011   Procedure: RADIOACTIVE SEED IMPLANT;  Surgeon: Garnett Farm, MD;  Location: [facility];  Service: Urology;  Laterality: N/A;  seeds implanted 50 seeds found in bladder=none    REPLACEMENT TOTAL KNEE Left 2018   SKIN CANCER EXCISION     Burned off left side of nose, and head   TOTAL KNEE ARTHROPLASTY Right 08/2018   UPPER GASTROINTESTINAL ENDOSCOPY     URETEROSCOPY  03/15/2012   Procedure: URETEROSCOPY;  Surgeon: Garnett Farm, MD;  [facility];  Service: Urology;  Laterality: Right;  Right Ueteroscopy     Family History  Problem Relation Age of Onset   Cancer Mother    Diabetes Father    CAD Father    Heart attack Father    Diabetes Sister    Colon polyps Sister    Colon  polyps Brother    Colon cancer Neg Hx    Esophageal cancer Neg Hx    Rectal cancer Neg Hx    Stomach cancer Neg Hx     Social History   Socioeconomic History   Marital status: Married    Spouse name: Not on file   Number of children: Not on file   Years of education: Not on file   Highest education level: Not on file  Occupational History   Not on file  Tobacco Use   Smoking status: Former    Current packs/day: 0.00    Types: Cigarettes    Start date: 07/03/1983    Quit date: 07/02/1993    Years since quitting: 29.5   Smokeless tobacco: Former    Types: Associate Professor status: Never Used  Substance and Sexual Activity   Alcohol use: No   Drug use: No   Sexual activity: Not on file  Other Topics Concern   Not on file  Social History Narrative   Not on file   Social Determinants of Health   Financial Resource Strain: Not on file  Food Insecurity: No Food Insecurity (05/04/2022)   Hunger Vital Sign    Worried About Running Out of Food in the Last Year: Never true    Ran Out of Food in the Last Year: Never true  Transportation Needs: No Transportation Needs (05/04/2022)   PRAPARE - Administrator, Civil Service (Medical): No    Lack of Transportation (Non-Medical): No  Physical Activity: Not on file  Stress: Not on file  Social Connections: Not on file  Intimate Partner Violence: Not At Risk (05/04/2022)   Humiliation, Afraid, Rape, and Kick questionnaire    Fear of Current or Ex-Partner: No    Emotionally Abused: No    Physically Abused: No    Sexually Abused: No    Review of Systems  Respiratory:  Positive for apnea.   Psychiatric/Behavioral:  Positive for sleep disturbance.     Vitals:   01/27/23 1356  BP: 124/74  Pulse: 60  SpO2: 97%     Physical Exam Constitutional:      Appearance: He is obese.  HENT:     Head: Normocephalic.     Mouth/Throat:     Mouth: Mucous membranes are  moist.  Cardiovascular:     Rate and Rhythm:  Normal rate and regular rhythm.     Heart sounds: No murmur heard.    No friction rub.  Pulmonary:     Effort: No respiratory distress.     Breath sounds: No stridor. No wheezing or rhonchi.  Musculoskeletal:     Cervical back: No rigidity or tenderness.  Neurological:     Mental Status: He is alert.  Psychiatric:        Mood and Affect: Mood normal.    Data Reviewed: Compliance data not available today   Assessment:  History of obstructive sleep apnea -Using CPAP nightly Tolerating CPAP well -Waking up feeling like his adequate nights rest No significant sleepiness during the day  Insomnia Back to using trazodone at night -Ambien did not help, felt it was too strong  Plan/Recommendations: Continue trazodone  Continues in CPAP on a nightly basis  Follow-up a year from now    Virl Diamond MD [facility] 01/27/2023, 2:06 PM  CC: Noni Saupe, MD

## 2023-02-12 DIAGNOSIS — L820 Inflamed seborrheic keratosis: Secondary | ICD-10-CM | POA: Diagnosis not present

## 2023-02-12 DIAGNOSIS — Z85828 Personal history of other malignant neoplasm of skin: Secondary | ICD-10-CM | POA: Diagnosis not present

## 2023-02-12 DIAGNOSIS — L821 Other seborrheic keratosis: Secondary | ICD-10-CM | POA: Diagnosis not present

## 2023-02-12 DIAGNOSIS — D1801 Hemangioma of skin and subcutaneous tissue: Secondary | ICD-10-CM | POA: Diagnosis not present

## 2023-02-12 DIAGNOSIS — L218 Other seborrheic dermatitis: Secondary | ICD-10-CM | POA: Diagnosis not present

## 2023-02-12 DIAGNOSIS — L570 Actinic keratosis: Secondary | ICD-10-CM | POA: Diagnosis not present

## 2023-02-25 DIAGNOSIS — N184 Chronic kidney disease, stage 4 (severe): Secondary | ICD-10-CM | POA: Diagnosis not present

## 2023-02-25 DIAGNOSIS — I700 Atherosclerosis of aorta: Secondary | ICD-10-CM | POA: Diagnosis not present

## 2023-02-25 DIAGNOSIS — E1122 Type 2 diabetes mellitus with diabetic chronic kidney disease: Secondary | ICD-10-CM | POA: Diagnosis not present

## 2023-02-25 DIAGNOSIS — Z79899 Other long term (current) drug therapy: Secondary | ICD-10-CM | POA: Diagnosis not present

## 2023-02-25 DIAGNOSIS — E1129 Type 2 diabetes mellitus with other diabetic kidney complication: Secondary | ICD-10-CM | POA: Diagnosis not present

## 2023-02-26 DIAGNOSIS — M65861 Other synovitis and tenosynovitis, right lower leg: Secondary | ICD-10-CM | POA: Diagnosis not present

## 2023-02-26 DIAGNOSIS — M19072 Primary osteoarthritis, left ankle and foot: Secondary | ICD-10-CM | POA: Insufficient documentation

## 2023-02-26 DIAGNOSIS — M25872 Other specified joint disorders, left ankle and foot: Secondary | ICD-10-CM | POA: Diagnosis not present

## 2023-02-26 DIAGNOSIS — M13872 Other specified arthritis, left ankle and foot: Secondary | ICD-10-CM | POA: Diagnosis not present

## 2023-04-01 ENCOUNTER — Other Ambulatory Visit (HOSPITAL_COMMUNITY): Payer: Self-pay | Admitting: Orthopedic Surgery

## 2023-04-13 DIAGNOSIS — H1033 Unspecified acute conjunctivitis, bilateral: Secondary | ICD-10-CM | POA: Diagnosis not present

## 2023-04-13 DIAGNOSIS — H00013 Hordeolum externum right eye, unspecified eyelid: Secondary | ICD-10-CM | POA: Diagnosis not present

## 2023-04-14 DIAGNOSIS — M25572 Pain in left ankle and joints of left foot: Secondary | ICD-10-CM | POA: Diagnosis not present

## 2023-04-20 DIAGNOSIS — N200 Calculus of kidney: Secondary | ICD-10-CM | POA: Diagnosis not present

## 2023-04-20 DIAGNOSIS — K2100 Gastro-esophageal reflux disease with esophagitis, without bleeding: Secondary | ICD-10-CM | POA: Diagnosis not present

## 2023-04-20 DIAGNOSIS — I129 Hypertensive chronic kidney disease with stage 1 through stage 4 chronic kidney disease, or unspecified chronic kidney disease: Secondary | ICD-10-CM | POA: Diagnosis not present

## 2023-04-20 DIAGNOSIS — N1832 Chronic kidney disease, stage 3b: Secondary | ICD-10-CM | POA: Diagnosis not present

## 2023-04-20 DIAGNOSIS — C61 Malignant neoplasm of prostate: Secondary | ICD-10-CM | POA: Diagnosis not present

## 2023-04-20 DIAGNOSIS — Z86711 Personal history of pulmonary embolism: Secondary | ICD-10-CM | POA: Diagnosis not present

## 2023-04-20 DIAGNOSIS — R7302 Impaired glucose tolerance (oral): Secondary | ICD-10-CM | POA: Diagnosis not present

## 2023-04-27 DIAGNOSIS — N179 Acute kidney failure, unspecified: Secondary | ICD-10-CM | POA: Diagnosis not present

## 2023-05-08 ENCOUNTER — Ambulatory Visit: Payer: Medicare Other | Attending: Cardiology | Admitting: Cardiology

## 2023-05-08 ENCOUNTER — Encounter: Payer: Self-pay | Admitting: Cardiology

## 2023-05-08 VITALS — BP 130/80 | HR 74 | Ht 65.5 in | Wt 251.0 lb

## 2023-05-08 DIAGNOSIS — I7789 Other specified disorders of arteries and arterioles: Secondary | ICD-10-CM | POA: Diagnosis present

## 2023-05-08 DIAGNOSIS — I714 Abdominal aortic aneurysm, without rupture, unspecified: Secondary | ICD-10-CM | POA: Diagnosis present

## 2023-05-08 DIAGNOSIS — I10 Essential (primary) hypertension: Secondary | ICD-10-CM | POA: Diagnosis present

## 2023-05-08 DIAGNOSIS — N184 Chronic kidney disease, stage 4 (severe): Secondary | ICD-10-CM | POA: Diagnosis present

## 2023-05-08 DIAGNOSIS — I700 Atherosclerosis of aorta: Secondary | ICD-10-CM | POA: Diagnosis present

## 2023-05-08 DIAGNOSIS — I7121 Aneurysm of the ascending aorta, without rupture: Secondary | ICD-10-CM

## 2023-05-08 MED ORDER — AMLODIPINE BESYLATE 2.5 MG PO TABS: 2.5000 mg | ORAL_TABLET | Freq: Every day | ORAL | 3 refills | Status: DC

## 2023-05-08 NOTE — Patient Instructions (Addendum)
Please keep a BP log for 2 weeks and send by MyChart or mail.                         Name and DOB__________________________   Blood Pressure Record Sheet To take your blood pressure, you will need a blood pressure machine. You can buy a blood pressure machine (blood pressure monitor) at your clinic, drug store, or online. When choosing one, consider: An automatic monitor that has an arm cuff. A cuff that wraps snugly around your upper arm. You should be able to fit only one finger between your arm and the cuff. A device that stores blood pressure reading results. Do not choose a monitor that measures your blood pressure from your wrist or finger. Follow your health care provider's instructions for how to take your blood pressure. To use this form: Get one reading in the morning (a.m.) 1-2 hours after you take any medicines. Get one reading in the evening (p.m.) before supper.   Blood pressure log Date: _______________________  a.m. _____________________(1st reading) HR___________            p.m. _____________________(2nd reading) HR__________  Date: _______________________  a.m. _____________________(1st reading) HR___________            p.m. _____________________(2nd reading) HR__________  Date: _______________________  a.m. _____________________(1st reading) HR___________            p.m. _____________________(2nd reading) HR__________  Date: _______________________  a.m. _____________________(1st reading) HR___________            p.m. _____________________(2nd reading) HR__________  Date: _______________________  a.m. _____________________(1st reading) HR___________            p.m. _____________________(2nd reading) HR__________  Date: _______________________  a.m. _____________________(1st reading) HR___________            p.m. _____________________(2nd reading) HR__________  Date: _______________________  a.m. _____________________(1st reading)  HR___________            p.m. _____________________(2nd reading) HR__________   This information is not intended to replace advice given to you by your health care provider. Make sure you discuss any questions you have with your health care provider. Document Revised: 09/07/2019 Document Reviewed: 09/07/2019 Elsevier Patient Education  2021 Elsevier Inc.  Medication Instructions:   Your physician has recommended you make the following change in your medication:   Start Norvasc 2.5 mg once a day.   *If you need a refill on your cardiac medications before your next appointment, please call your pharmacy*   Lab Work: None ordered  If you have any lab test that is abnormal or we need to change your treatment, we will call you to review the results.   Testing/Procedures: Around August 2025, you will need to have a Chest CT completed.     Follow-Up: At [facility], you and your health needs are our priority.  As part of our continuing mission to provide you with exceptional heart care, we have created designated Provider Care Teams.  These Care Teams include your primary Cardiologist (physician) and Advanced Practice Providers (APPs -  Physician Assistants and Nurse Practitioners) who all work together to provide you with the care you need, when you need it.  We recommend signing up for the patient portal called "MyChart".  Sign up information is provided on this After Visit Summary.  MyChart is used to connect with patients for Virtual Visits (Telemedicine).  Patients are able to view lab/test results, encounter notes, upcoming appointments, etc.  Non-urgent  messages can be sent to your provider as well.   To learn more about what you can do with MyChart, go to [email or web address].    Your next appointment:   1 year(s)  Provider:   [name]

## 2023-05-08 NOTE — Progress Notes (Signed)
 " Cardiology Office Note:  .   Date:  05/09/2023  ID:  Joseph Pham, DOB Jun 04, 1950, MRN [id number] PCP: Conley Dene BROCKS, DO  [facility] Providers Cardiologist:  Redell Leiter, MD    History of Present Illness: .   Joseph Pham is a 72 y.o. male with a past medical history of ascending aortic enlargement, aortic atherosclerosis noted on CT imaging, hypertension, history of PE, OSA on CPAP, GERD, DM 2, history of prostate cancer, CKD, dyslipidemia.  01/14/2023 CT of chest ascending aortic aneurysm 4.1 cm, mild aortic atherosclerosis 05/21/2020 AAA duplex no evidence of abdominal aortic aneurysm 02/03/2019 echo EF 60 to 65%, impaired relaxation, mild thickening of the aortic valve, mild aortic insufficiency 05/18/2018 Lexiscan  no ischemia, low risk study  Most recently evaluated by Dr. Leiter on 04/17/2022, was doing well from a cardiac perspective and advised to follow-up in 1 year.  He presents today accompanied by his wife for follow-up of his ascending aortic enlargement and hypertension.  He was recently evaluated by his nephrologist, has had some worsening with his kidney dysfunction and his lisinopril  has been stopped.  He questions whether he needs to be on anything else for his blood pressure.  He has been doing well from a cardiac perspective, offers no formal complaints. He denies chest pain, palpitations, dyspnea, pnd, orthopnea, n, v, dizziness, syncope, edema, weight gain, or early satiety.   ROS: Review of Systems  Cardiovascular:  Positive for leg swelling.  Musculoskeletal:  Positive for back pain and myalgias.  All other systems reviewed and are negative.    Studies Reviewed: SABRA   EKG Interpretation Date/Time:  Friday May 08 2023 14:19:48 EST Ventricular Rate:  74 PR Interval:  166 QRS Duration:  96 QT Interval:  372 QTC Calculation: 412 R Axis:   22  Text Interpretation: Normal sinus rhythm Normal ECG When compared with ECG of 03-Feb-2019  02:12, PREVIOUS ECG IS PRESENT Confirmed by Carlin Nest [phone]) on 05/09/2023 6:31:57 PM     Risk Assessment/Calculations:             Physical Exam:   VS:  BP 130/80 (BP Location: Right Arm, Patient Position: Sitting, Cuff Size: Normal)   Pulse 74   Ht 5' 5.5 (1.664 m)   Wt 251 lb (113.9 kg)   SpO2 95%   BMI 41.13 kg/m    Wt Readings from Last 3 Encounters:  05/08/23 251 lb (113.9 kg)  01/27/23 250 lb 3.2 oz (113.5 kg)  05/20/22 249 lb 6.4 oz (113.1 kg)    GEN: Well nourished, well developed in no acute distress NECK: No JVD; No carotid bruits CARDIAC: RRR, no murmurs, rubs, gallops RESPIRATORY:  Clear to auscultation without rales, wheezing or rhonchi  ABDOMEN: Soft, non-tender, non-distended EXTREMITIES:  non-pitting edema; No deformity   ASSESSMENT AND PLAN: .   Ascending aortic dilatation-CT of the chest in August of this year revealed dilatation of 4.1 cm.  Plan to repeat imaging in 1 year.  Aortic atherosclerosis-this was noted on CT imaging, Stable with no anginal symptoms. No indication for ischemic evaluation.  Most recent Lexiscan  was in 2019 which revealed no ischemia.  Continue Lipitor 10 mg daily, currently on Xarelto .  CKD stage IV-follows with nephrology, recently evaluated by them creatinine 2.99 GFR 21, his lisinopril  was stopped.  Careful titration of diuretic and antihypertensive agents.   Hypertension-he was apparently having episodes of hypotension at home as well prior to his lisinopril  being held.  In the office today  it is controlled at 130/80, apparently has upcoming procedure and him and his wife are concerned that his blood pressure may continue to trend upward.  Will start him on low-dose amlodipine  and have him keep a blood pressure log.  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       Dispo: Amlodipine  2.5 mg daily, keep blood pressure log for 2 weeks, return in 1 year.  Signed, Delon JAYSON Hoover, NP   "

## 2023-05-18 ENCOUNTER — Other Ambulatory Visit: Payer: Self-pay

## 2023-05-18 ENCOUNTER — Encounter (HOSPITAL_BASED_OUTPATIENT_CLINIC_OR_DEPARTMENT_OTHER): Payer: Self-pay | Admitting: Orthopedic Surgery

## 2023-05-18 ENCOUNTER — Telehealth: Payer: Self-pay | Admitting: *Deleted

## 2023-05-18 DIAGNOSIS — I10 Essential (primary) hypertension: Secondary | ICD-10-CM

## 2023-05-18 MED ORDER — AMLODIPINE BESYLATE 2.5 MG PO TABS
2.5000 mg | ORAL_TABLET | Freq: Every day | ORAL | 3 refills | Status: DC
Start: 2023-05-18 — End: 2023-07-10

## 2023-05-18 NOTE — Progress Notes (Signed)
   05/18/23 1406  Pre-op Phone Call  Surgery Date Verified 06/04/23  Arrival Time Verified 0600  Surgery Location Verified [facility]  Medical History Reviewed Yes  Is the patient taking a GLP-1 receptor agonist? No  Does the patient have diabetes? Type II  Does the patient use a Continuous Blood Glucose Monitor? No  Is the patient on an insulin pump? No  Has the diabetes coordinator been notified? No  Do you have a history of heart problems? Yes  Cardiologist Name Norman Herrlich MD  Have you ever had tests on your heart? Yes  What cardiac tests were performed? EKG  Results viewable: [facility] [employer] Tab  Does patient have other implanted devices? No  Patient Teaching Enhanced Recovery;Pre / Post Procedure  Patient educated about smoking cessation 24 hours prior to surgery. N/A Non-Smoker  Patient verbalizes understanding of bowel prep? N/A  THA/TKA patients only:  By your surgery date, will you have been taking narcotics for 90 days or greater? No  Med Rec Completed Yes  Take the Following Meds the Morning of Surgery allegra, lipitor, amlodipine, prilosece  Recent  Lab Work, EKG, CXR? No  NPO (Including gum & candy) After midnight  Allowed clear liquids Water;Gatorade  (diabetics please choose diet or no sugar options)  Patient instructed to stop clear liquids including Carb loading drink at: 0430  Stop Solids, Milk, Candy, and Gum STARTING AT MIDNIGHT  Responsible adult to drive and be with you for 24 hours? No  Name & Phone Number for Ride/Caregiver wife and son  No Jewelry, money, nail polish or make-up.  No lotions, powders, perfumes. No shaving  48 hrs. prior to surgery. Yes  Contacts, Dentures & Glasses Will Have to be Removed Before OR. Yes  Please bring your ID and Insurance Card the morning of your surgery. (Surgery Centers Only) Yes  Bring any papers or x-rays with you that your surgeon gave you. Yes  Instructed to contact the location of procedure/ provider if they or anyone in their  household develops symptoms or tests positive for COVID-19, has close contact with someone who tests positive for COVID, or has known exposure to any contagious illness. Yes  Call this number the morning of surgery  with any problems that may cancel your surgery. [phone]  Covid-19 Assessment  Have you had a positive COVID-19 test within the previous 90 days? No  COVID Testing Guidance Proceed with the additional questions.  Patient's surgery required a COVID-19 test (cardiothoracic, complex ENT, and bronchoscopies/ EBUS) No  Have you been unmasked and in close contact with anyone with COVID-19 or COVID-19 symptoms within the past 10 days? No  Do you or anyone in your household currently have any COVID-19 symptoms? No   Called Dr. Angelica Pou office for xarelto hold orders

## 2023-05-18 NOTE — Telephone Encounter (Signed)
Rx refill sent to pharmacy. 

## 2023-05-19 ENCOUNTER — Encounter (HOSPITAL_BASED_OUTPATIENT_CLINIC_OR_DEPARTMENT_OTHER)
Admission: RE | Admit: 2023-05-19 | Discharge: 2023-05-19 | Disposition: A | Discharge status: Home / Self Care | Payer: Medicare Other | Source: Ambulatory Visit | Attending: Orthopedic Surgery | Admitting: Orthopedic Surgery

## 2023-05-19 DIAGNOSIS — Z01812 Encounter for preprocedural laboratory examination: Secondary | ICD-10-CM | POA: Insufficient documentation

## 2023-05-19 LAB — BASIC METABOLIC PANEL
Anion gap: 9 (ref 5–15)
BUN: 29 mg/dL — ABNORMAL HIGH (ref 8–23)
CO2: 25 mmol/L (ref 22–32)
Calcium: 9.4 mg/dL (ref 8.9–10.3)
Chloride: 103 mmol/L (ref 98–111)
Creatinine, Ser: 2.77 mg/dL — ABNORMAL HIGH (ref 0.61–1.24)
GFR, Estimated: 24 mL/min — ABNORMAL LOW (ref >60)
Glucose, Bld: 104 mg/dL — ABNORMAL HIGH (ref 70–99)
Potassium: 5.4 mmol/L — ABNORMAL HIGH (ref 3.5–5.1)
Sodium: 137 mmol/L (ref 135–145)

## 2023-05-19 NOTE — Progress Notes (Signed)

## 2023-05-20 ENCOUNTER — Encounter (HOSPITAL_COMMUNITY): Payer: Self-pay | Admitting: Anesthesiology

## 2023-05-20 NOTE — Progress Notes (Addendum)
PAT lab results reviewed with Dr. Renold Don.  Pt will need to follow-up with primary MD and have renal clearance prior to surgery.  Left message for patient, will follow-up.  Addendum: Spoke with patient, explained some labs were abnormal. Pt states he has kidney provider through [facility], instructed patient to contact them for follow-up appointment.  Explained to patient that he will need clearance from kidney provider prior to outpatient surgery.  Notified Dr. Laverta Baltimore office of above and need for clearance.

## 2023-05-25 ENCOUNTER — Other Ambulatory Visit: Payer: Self-pay | Admitting: Internal Medicine

## 2023-05-25 DIAGNOSIS — N179 Acute kidney failure, unspecified: Secondary | ICD-10-CM

## 2023-05-28 ENCOUNTER — Ambulatory Visit
Admission: RE | Admit: 2023-05-28 | Discharge: 2023-05-28 | Disposition: A | Discharge status: Home / Self Care | Payer: Medicare Other | Source: Ambulatory Visit | Attending: Internal Medicine | Admitting: Internal Medicine

## 2023-05-28 DIAGNOSIS — N179 Acute kidney failure, unspecified: Secondary | ICD-10-CM

## 2023-05-29 DIAGNOSIS — E1129 Type 2 diabetes mellitus with other diabetic kidney complication: Secondary | ICD-10-CM | POA: Diagnosis not present

## 2023-05-29 DIAGNOSIS — N1832 Chronic kidney disease, stage 3b: Secondary | ICD-10-CM | POA: Diagnosis not present

## 2023-06-02 DIAGNOSIS — Z8546 Personal history of malignant neoplasm of prostate: Secondary | ICD-10-CM | POA: Diagnosis not present

## 2023-06-02 DIAGNOSIS — N130 Hydronephrosis with ureteropelvic junction obstruction: Secondary | ICD-10-CM | POA: Diagnosis not present

## 2023-06-04 ENCOUNTER — Ambulatory Visit (HOSPITAL_BASED_OUTPATIENT_CLINIC_OR_DEPARTMENT_OTHER): Admit: 2023-06-04 | Payer: Medicare Other | Admitting: Orthopedic Surgery

## 2023-06-04 DIAGNOSIS — Z01818 Encounter for other preprocedural examination: Secondary | ICD-10-CM

## 2023-06-04 SURGERY — ARTHRODESIS ANKLE
Anesthesia: General | Site: Ankle | Laterality: Left

## 2023-06-08 DIAGNOSIS — K429 Umbilical hernia without obstruction or gangrene: Secondary | ICD-10-CM | POA: Diagnosis not present

## 2023-06-08 DIAGNOSIS — K5730 Diverticulosis of large intestine without perforation or abscess without bleeding: Secondary | ICD-10-CM | POA: Diagnosis not present

## 2023-06-08 DIAGNOSIS — N1330 Unspecified hydronephrosis: Secondary | ICD-10-CM | POA: Diagnosis not present

## 2023-06-08 DIAGNOSIS — N132 Hydronephrosis with renal and ureteral calculous obstruction: Secondary | ICD-10-CM | POA: Diagnosis not present

## 2023-06-18 ENCOUNTER — Telehealth: Payer: Self-pay | Admitting: Cardiology

## 2023-06-19 NOTE — Telephone Encounter (Signed)
Reviewed jennifer Woody's note with pt.   Pt verbalized understanding and had no additional questions.

## 2023-06-23 NOTE — Addendum Note (Signed)
Addended by: Roddie Mc on: 06/23/2023 01:37 PM   Modules accepted: Orders

## 2023-07-06 ENCOUNTER — Telehealth: Payer: Self-pay | Admitting: Cardiology

## 2023-07-06 ENCOUNTER — Other Ambulatory Visit: Payer: Self-pay | Admitting: Urology

## 2023-07-06 NOTE — Telephone Encounter (Signed)
   Pre-operative Risk Assessment    Patient Name: Joseph Pham  DOB: August 23, 1950 MRN: [id number]   Date of last office visit: 05/08/23 Date of next office visit: not due until December 2025    Request for Surgical Clearance    Procedure:   Cystoscopy, right retrograde pyelogram, right ureteroscopy, laser lithotripsy, right stent   Date of Surgery:  Clearance TBD                                Surgeon:   Dr. Jettie Pagan  Surgeon's Group or Practice Name:  [facility]  Phone number:  [phone]  Fax number:  [phone]    Type of Clearance Requested:   - Medical  - Pharmacy:  Hold Rivaroxaban (Xarelto)     Type of Anesthesia:  General    Additional requests/questions:    Alben Spittle   07/06/2023, 10:11 AM

## 2023-07-06 NOTE — Telephone Encounter (Signed)
Patient is calling to inform us of his procedure on 07/24/23. Patient is having his pre admission on 07/09/23 Patient would like to know if he will have clearance before 07/09/23. Patient's is concerned that he will have to do the pre admission twice and is unsure if insurance would cover it. Please advise.

## 2023-07-06 NOTE — Telephone Encounter (Signed)
Returned call to pt, he is aware that when his clearance has been processed and sent back to [place], we will call and get him scheduled.

## 2023-07-07 ENCOUNTER — Other Ambulatory Visit: Payer: Self-pay | Admitting: Urology

## 2023-07-07 NOTE — Telephone Encounter (Signed)
 Pt returning call

## 2023-07-07 NOTE — Telephone Encounter (Signed)
 I have s/w the pt and informed that we did not have any tele visits in time for his procedure and med hold. Ok per Josefa Beauvais, FNP ok to schedule in office as to not cause delays in preop clearance for the pt.   Pt is grateful for all of our help. I will update all parties involved.

## 2023-07-07 NOTE — Telephone Encounter (Signed)
Left message for the pt to call back and schedule tel preop appt.

## 2023-07-07 NOTE — Telephone Encounter (Signed)
   Name: Joseph Pham  DOB: 03-05-1951  MRN: [id number]  Primary Cardiologist: Redell Leiter, MD   Preoperative team, please contact this patient and set up a phone call appointment for further preoperative risk assessment. Please obtain consent and complete medication review. Thank you for your help.  I confirm that guidance regarding antiplatelet and oral anticoagulation therapy has been completed and, if necessary, noted below.  Patient with diagnosis of hx of PE/DVT on Xarelto   for anticoagulation.     Procedure: Cystoscopy, right retrograde pyelogram, right ureteroscopy, laser lithotripsy, right stent  Date of procedure: 07/24/23     CrCl 38 mL/min Platelet count 203 K   Per office protocol, patient can hold Xarelto  for 2-3 days prior to procedure.  I also confirmed the patient resides in the state of [place] . As per [facility] Medical Board telemedicine laws, the patient must reside in the state in which the provider is licensed.   Josefa CHRISTELLA Beauvais, NP 07/07/2023, 9:16 AM [facility]

## 2023-07-07 NOTE — Telephone Encounter (Signed)
 Patient with diagnosis of hx of PE/DVT on Xarelto   for anticoagulation.    Procedure: Cystoscopy, right retrograde pyelogram, right ureteroscopy, laser lithotripsy, right stent  Date of procedure: 07/24/23   CrCl 38 mL/min Platelet count 203 K  Per office protocol, patient can hold Xarelto  for 2-3 days prior to procedure.     **This guidance is not considered finalized until pre-operative APP has relayed final recommendations.**

## 2023-07-09 ENCOUNTER — Encounter (HOSPITAL_COMMUNITY): Payer: Medicare Other

## 2023-07-09 NOTE — Progress Notes (Signed)
 Cardiology Office Note:  .   Date:  07/10/2023  ID:  Joseph Pham, DOB [birth year], MRN [id number] PCP: Conley Dene BROCKS, DO  [facility] Providers Cardiologist:  Redell Leiter, MD {  History of Present Illness: .   Joseph Pham is a 73 y.o. male with a past medical history of ascending aortic enlargement, aortic atherosclerosis noted on CT imaging, hypertension, history of PE, OSA on CPAP, GERD, DM 2, history of prostate cancer, CKD, and dyslipidemia here for follow-up appointment.  Most recently evaluated by Delon Hoover, NP back in December.  The patient, with a history of an enlarged aorta and kidney stones, presents for preoperative clearance for an upcoming surgery on the left kidney. The patient reports that his blood pressure has been ranging from 136 to mid-140s at home, which he attributes to dietary factors such as high salt intake. He acknowledges the importance of keeping his blood pressure under control due to his aortic condition. The patient denies experiencing any chest pain or shortness of breath.  The patient also reports a significant foot condition that has been causing discomfort and limiting his mobility. He mentions that he is due for surgery on the foot, which has been delayed due to various administrative issues. The patient expresses frustration with the delays and the impact of his foot condition on his daily activities. He reports using a cane for mobility outside the home and has a scooter for longer distances. The patient also mentions a history of blood clots in the lungs following a previous knee surgery, which has led to him being on Xarelto .  Reports no shortness of breath nor dyspnea on exertion. Reports no chest pain, pressure, or tightness. No edema, orthopnea, PND. Reports no palpitations.   Discussed the use of AI scribe software for clinical note transcription with the patient, who gave verbal consent to proceed.  Per office  protocol, patient can hold Xarelto  for 2-3 days prior to procedure   Suppose to be LEFT side for his kidney surgery.   ROS: pertinent ROS in HPI  Studies Reviewed: SABRA      Recent testing  01/14/2023 CT of chest ascending aortic aneurysm 4.1 cm, mild aortic atherosclerosis 05/21/2020 AAA duplex no evidence of abdominal aortic aneurysm 02/03/2019 echo EF 60 to 65%, impaired relaxation, mild thickening of the aortic valve, mild aortic insufficiency 05/18/2018 Lexiscan  no ischemia, low risk study      Physical Exam:   VS:  BP (!) 150/78 (BP Location: Right Arm, Patient Position: Sitting, Cuff Size: Large)   Pulse 61   Resp 16   Ht 5' 6 (1.676 m)   Wt 250 lb (113.4 kg)   SpO2 97%   BMI 40.35 kg/m    Wt Readings from Last 3 Encounters:  07/10/23 250 lb (113.4 kg)  05/08/23 251 lb (113.9 kg)  01/27/23 250 lb 3.2 oz (113.5 kg)    GEN: Well nourished, well developed in no acute distress NECK: No JVD; No carotid bruits CARDIAC: RRR, no murmurs, rubs, gallops RESPIRATORY:  Clear to auscultation without rales, wheezing or rhonchi  ABDOMEN: Soft, non-tender, non-distended EXTREMITIES:  No edema; No deformity   ASSESSMENT AND PLAN: .   Preop Clearance  Mr. Baggerly perioperative risk of a major cardiac event is 6.6% according to the Revised Cardiac Risk Index (RCRI).  Therefore, he is at high risk for perioperative complications.   His functional capacity is good at 5.29 METs according to the Duke Activity Status Index (DASI). Recommendations:  According to ACC/AHA guidelines, no further cardiovascular testing needed.  The patient may proceed to surgery at acceptable risk.   Antiplatelet and/or Anticoagulation Recommendations:  Xarelto  (Rivaroxaban ) can be held for 2-3 days prior to surgery.  Please resume post op when felt to be safe.    Hypertension Blood pressure elevated in clinic (150s/70s) and at home (130s-140s). Patient has a history of aortic enlargement, making blood pressure  control crucial. -Increase Amlodipine  from 2.5mg  to 5mg  daily. -Continue home blood pressure monitoring.  Anticoagulation Management Patient on Xarelto  due to history of pulmonary embolism post knee surgery. -Hold Xarelto  2-3 days prior to surgery as per protocol.  Hypertriglyceridemia Triglycerides elevated at 256. Patient reports this is a chronic issue with a family history. -Continue current management.  Foot Pain Patient reports significant pain and swelling in left foot, limiting mobility. Surgery planned post kidney stone surgery. -No changes to current management at this time.  Follow-up -Check blood pressure and adjust Amlodipine  as needed. -Postoperative follow-up after kidney stone surgery.     Dispo: He can follow-up in 6 months  Signed, Orren LOISE Fabry, PA-C

## 2023-07-10 ENCOUNTER — Ambulatory Visit: Payer: Medicare Other | Admitting: Cardiology

## 2023-07-10 ENCOUNTER — Encounter: Payer: Self-pay | Admitting: Physician Assistant

## 2023-07-10 ENCOUNTER — Ambulatory Visit: Payer: Medicare Other | Attending: Physician Assistant | Admitting: Physician Assistant

## 2023-07-10 VITALS — BP 150/78 | HR 61 | Resp 16 | Ht 66.0 in | Wt 250.0 lb

## 2023-07-10 DIAGNOSIS — I7789 Other specified disorders of arteries and arterioles: Secondary | ICD-10-CM

## 2023-07-10 DIAGNOSIS — N184 Chronic kidney disease, stage 4 (severe): Secondary | ICD-10-CM | POA: Diagnosis not present

## 2023-07-10 DIAGNOSIS — I700 Atherosclerosis of aorta: Secondary | ICD-10-CM

## 2023-07-10 DIAGNOSIS — E782 Mixed hyperlipidemia: Secondary | ICD-10-CM

## 2023-07-10 DIAGNOSIS — I10 Essential (primary) hypertension: Secondary | ICD-10-CM | POA: Diagnosis not present

## 2023-07-10 DIAGNOSIS — Z01810 Encounter for preprocedural cardiovascular examination: Secondary | ICD-10-CM | POA: Diagnosis not present

## 2023-07-10 DIAGNOSIS — Z7901 Long term (current) use of anticoagulants: Secondary | ICD-10-CM

## 2023-07-10 MED ORDER — AMLODIPINE BESYLATE 5 MG PO TABS: 5.0000 mg | ORAL_TABLET | Freq: Every day | ORAL | 3 refills | Status: DC

## 2023-07-10 NOTE — Patient Instructions (Addendum)
 Medication Instructions:  Your physician has recommended you make the following change in your medication:  Increase your amlodipine  to 5mg  daily.  Lab Work: None ordered.  If you have labs (blood work) drawn today and your tests are completely normal, you will receive your results only by: MyChart Message (if you have MyChart) OR A paper copy in the mail If you have any lab test that is abnormal or we need to change your treatment, we will call you to review the results.  Testing/Procedures: Take your blood pressure 1 to 2 hours after your morning medications. Please send those readings to us  in 2 weeks.  Follow-Up: At [facility], you and your health needs are our priority.  As part of our continuing mission to provide you with exceptional heart care, we have created designated Provider Care Teams.  These Care Teams include your primary Cardiologist (physician) and Advanced Practice Providers (APPs -  Physician Assistants and Nurse Practitioners) who all work together to provide you with the care you need, when you need it.  Your next appointment:   6 months  The format for your next appointment:   In Person  Provider:   Dr Monetta or Delon Hoover, NP  Important Information About Sugar

## 2023-07-15 ENCOUNTER — Encounter (HOSPITAL_COMMUNITY)
Admission: RE | Admit: 2023-07-15 | Discharge: 2023-07-15 | Disposition: A | Discharge status: Home / Self Care | Payer: Medicare Other | Source: Ambulatory Visit | Attending: Urology | Admitting: Urology

## 2023-07-15 ENCOUNTER — Other Ambulatory Visit: Payer: Self-pay

## 2023-07-15 ENCOUNTER — Encounter (HOSPITAL_COMMUNITY): Payer: Self-pay

## 2023-07-15 VITALS — BP 152/82 | HR 59 | Temp 98.2°F | Ht 66.0 in | Wt 253.5 lb

## 2023-07-15 DIAGNOSIS — Z86718 Personal history of other venous thrombosis and embolism: Secondary | ICD-10-CM | POA: Insufficient documentation

## 2023-07-15 DIAGNOSIS — N182 Chronic kidney disease, stage 2 (mild): Secondary | ICD-10-CM | POA: Insufficient documentation

## 2023-07-15 DIAGNOSIS — E669 Obesity, unspecified: Secondary | ICD-10-CM | POA: Insufficient documentation

## 2023-07-15 DIAGNOSIS — Z01812 Encounter for preprocedural laboratory examination: Secondary | ICD-10-CM | POA: Diagnosis not present

## 2023-07-15 DIAGNOSIS — E1122 Type 2 diabetes mellitus with diabetic chronic kidney disease: Secondary | ICD-10-CM | POA: Diagnosis not present

## 2023-07-15 DIAGNOSIS — G4733 Obstructive sleep apnea (adult) (pediatric): Secondary | ICD-10-CM | POA: Diagnosis not present

## 2023-07-15 DIAGNOSIS — N201 Calculus of ureter: Secondary | ICD-10-CM | POA: Insufficient documentation

## 2023-07-15 DIAGNOSIS — Z86711 Personal history of pulmonary embolism: Secondary | ICD-10-CM | POA: Diagnosis not present

## 2023-07-15 DIAGNOSIS — R011 Cardiac murmur, unspecified: Secondary | ICD-10-CM | POA: Diagnosis not present

## 2023-07-15 DIAGNOSIS — K219 Gastro-esophageal reflux disease without esophagitis: Secondary | ICD-10-CM | POA: Diagnosis not present

## 2023-07-15 DIAGNOSIS — Z6841 Body Mass Index (BMI) 40.0 and over, adult: Secondary | ICD-10-CM | POA: Diagnosis not present

## 2023-07-15 DIAGNOSIS — I129 Hypertensive chronic kidney disease with stage 1 through stage 4 chronic kidney disease, or unspecified chronic kidney disease: Secondary | ICD-10-CM | POA: Diagnosis not present

## 2023-07-15 DIAGNOSIS — Z87891 Personal history of nicotine dependence: Secondary | ICD-10-CM | POA: Diagnosis not present

## 2023-07-15 DIAGNOSIS — E119 Type 2 diabetes mellitus without complications: Secondary | ICD-10-CM

## 2023-07-15 LAB — BASIC METABOLIC PANEL
Anion gap: 9 (ref 5–15)
BUN: 32 mg/dL — ABNORMAL HIGH (ref 8–23)
CO2: 26 mmol/L (ref 22–32)
Calcium: 9.3 mg/dL (ref 8.9–10.3)
Chloride: 101 mmol/L (ref 98–111)
Creatinine, Ser: 2.38 mg/dL — ABNORMAL HIGH (ref 0.61–1.24)
GFR, Estimated: 28 mL/min — ABNORMAL LOW (ref >60)
Glucose, Bld: 126 mg/dL — ABNORMAL HIGH (ref 70–99)
Potassium: 4.7 mmol/L (ref 3.5–5.1)
Sodium: 136 mmol/L (ref 135–145)

## 2023-07-15 LAB — HEMOGLOBIN A1C
Hgb A1c MFr Bld: 6.3 % — ABNORMAL HIGH (ref 4.8–5.6)
Mean Plasma Glucose: 134.11 mg/dL

## 2023-07-15 LAB — CBC
HCT: 42.1 % (ref 39.0–52.0)
Hemoglobin: 13.1 g/dL (ref 13.0–17.0)
MCH: 27.2 pg (ref 26.0–34.0)
MCHC: 31.1 g/dL (ref 30.0–36.0)
MCV: 87.5 fL (ref 80.0–100.0)
Platelets: 216 10*3/uL (ref 150–400)
RBC: 4.81 MIL/uL (ref 4.22–5.81)
RDW: 14 % (ref 11.5–15.5)
WBC: 6.2 10*3/uL (ref 4.0–10.5)
nRBC: 0 % (ref 0.0–0.2)

## 2023-07-15 LAB — GLUCOSE, CAPILLARY: Glucose-Capillary: 143 mg/dL — ABNORMAL HIGH (ref 70–99)

## 2023-07-15 NOTE — Progress Notes (Signed)
Lab. Results: Creatinine: 2.38

## 2023-07-15 NOTE — Progress Notes (Signed)
For Anesthesia: PCP - Annamaria Helling, DO  Cardiologist - Baldo Daub, MD  Clearance: Jari Favre: PA-C: 07/10/23 Bowel Prep reminder:  Chest x-ray - CT Chest: 01/14/23 EKG - 05/08/23 Stress Test -  ECHO - 03/02/19 Cardiac Cath -  Pacemaker/ICD device last checked: Pacemaker orders received: Device Rep notified:  Spinal Cord Stimulator:N/A  Sleep Study - Yes CPAP - Yes  Fasting Blood Sugar - N/A Checks Blood Sugar __0___ times a day Date and result of last Hgb A1c-  Last dose of GLP1 agonist- N/A GLP1 instructions:   Last dose of SGLT-2 inhibitors- N/A SGLT-2 instructions:   Blood Thinner Instructions: Xarelto: will be hold after: 07/20/23 Aspirin Instructions: Last Dose:  Activity level: Can go up a flight of stairs and activities of daily living without stopping and without chest pain and/or shortness of breath   Able to exercise without chest pain and/or shortness of breath  Anesthesia review: Hx: HTN,DIA,CKD II,Murmur,OSA(CPAP).  Patient denies shortness of breath, fever, cough and chest pain at PAT appointment   Patient verbalized understanding of instructions that were given to them at the PAT appointment. Patient was also instructed that they will need to review over the PAT instructions again at home before surgery.

## 2023-07-16 ENCOUNTER — Encounter (HOSPITAL_COMMUNITY): Payer: Self-pay

## 2023-07-16 DIAGNOSIS — N130 Hydronephrosis with ureteropelvic junction obstruction: Secondary | ICD-10-CM | POA: Diagnosis not present

## 2023-07-16 DIAGNOSIS — N201 Calculus of ureter: Secondary | ICD-10-CM | POA: Diagnosis not present

## 2023-07-16 NOTE — Progress Notes (Addendum)
Case: [id number] Date/Time: 07/24/23 1330   Procedure: CYSTOSCOPY/LEFT  RETROGRADE PYELOGRAM/ LEFT  URETEROSCOPY/HOLMIUM LASER/LEFT  STENT PLACEMENT (Left) - 60 MINUTES NEEDED FOR CASE   Anesthesia type: General   Pre-op diagnosis: LEFT  URETERAL STONES   [facility]   Surgeons: Jannifer Hick, MD       DISCUSSION: Joseph Pham is a 73 year old male who presents to PAT prior to surgery above.  Past medical history significant for former smoking, hypertension, TAA (4.1 cm by CT on 01/14/2023), heart murmur, history of DVT and PE on Xarelto, OSA on CPAP, GERD, prediabetes (A1c 6.3), CKD, prostate cancer s/p brachytherapy, obesity (BMI 40).  Prior complication of anesthesia includes bradycardia during lithotripsy  Patient follows with cardiology for surveillance of a TAA.  CT chest in August 2024 showed TAA was measuring 4.1 cm which is stable from prior.  Last seen on 07/09/2022 for preop clearance.  Patient denied any symptoms.  Specifically no shortness of breath nor dyspnea on exertion. Reports no chest pain, pressure, or tightness. No edema, orthopnea, PND. Reports no palpitations.  He was cleared for surgery:  "Preop Clearance   [name] perioperative risk of a major cardiac event is 6.6% according to the Revised Cardiac Risk Index (RCRI).  Therefore, he is at high risk for perioperative complications.   His functional capacity is good at 5.29 METs according to the Duke Activity Status Index (DASI). Recommendations: According to ACC/AHA guidelines, no further cardiovascular testing needed.  The patient may proceed to surgery at acceptable risk.   Antiplatelet and/or Anticoagulation Recommendations:   Xarelto (Rivaroxaban) can be held for 2-3 days prior to surgery.  Please resume post op when felt to be safe "  Patient follows with pulmonology for OSA.  He uses his CPAP.  He follows with [facility] for his CKD.  Creatinine is stable on labs.  Xarelto:  hold after: 07/20/23  VS: BP (!) 152/82   Pulse (!) 59   Temp 36.8 C (Oral)   Ht 5\' 6"  (1.676 m)   Wt 115 kg   SpO2 97%   BMI 40.92 kg/m   PROVIDERS: Annamaria Helling, DO Cardiology: Norman Herrlich, MD Nephrology: Estill Bakes, MD   LABS: Labs reviewed: Acceptable for surgery. (all labs ordered are listed, but only abnormal results are displayed)  Labs Reviewed  HEMOGLOBIN A1C - Abnormal; Notable for the following components:      Result Value   Hgb A1c MFr Bld 6.3 (*)    All other components within normal limits  BASIC METABOLIC PANEL - Abnormal; Notable for the following components:   Glucose, Bld 126 (*)    BUN 32 (*)    Creatinine, Ser 2.38 (*)    GFR, Estimated 28 (*)    All other components within normal limits  GLUCOSE, CAPILLARY - Abnormal; Notable for the following components:   Glucose-Capillary 143 (*)    All other components within normal limits  CBC     IMAGES:  CT chest 01/14/2023:  IMPRESSION: 4.1 cm ascending thoracic aortic aneurysm. Recommend annual imaging followup by CTA or MRA. This recommendation follows 2010 ACCF/AHA/AATS/ACR/ASA/SCA/SCAI/SIR/STS/SVM Guidelines for the Diagnosis and Management of Patients with Thoracic Aortic Disease. Circulation. 2010; 121: J478-G956. Aortic aneurysm NOS (ICD10-I71.9)   Aortic Atherosclerosis (ICD10-I70.0).  EKG 05/08/23  NSR, rate 74  CV:  Past Medical History:  Diagnosis Date   AAA (abdominal aortic aneurysm) (HCC) 04/16/2022   Abnormal EKG 10/14/2015   Overview:  Poor R  wave progression and abnormal T waves   Allergy    Arthritis HANDS AND LEFT KNEE   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   Overview:  43 mm ascending, 11/22/15   Bilateral hearing loss 07/19/2019   Cataract    forming bilaterally    Chest pain    Chronic tonsillitis 07/19/2019   CKD (chronic kidney disease), 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   Complication of anesthesia    low heart rate during lithotripsy procedure    Deviated septum 07/19/2019   Diabetes (HCC) 03/16/2017   Essential hypertension 10/14/2015   Family history of adverse reaction to anesthesia    sister coded after surgery   GERD (gastroesophageal reflux disease)    Heart murmur    Hematuria    Hemorrhoid    History of kidney stones    Hyperlipidemia    Impingement of left ankle joint 03/06/2021   Kidney stone 03/16/2017   Lumbar radiculopathy 03/16/2017   Nasal turbinate hypertrophy 07/19/2019   OSA on CPAP    Paronychia 08/16/2015   PE (pulmonary thromboembolism) (HCC) 02/2019   Posterior tibialis tendinitis of both lower extremities 02/02/2020   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   CARPAL TUNNEL RELEASE Left 06/2016   CARPAL TUNNEL RELEASE Right 03/2017   CATARACT EXTRACTION, BILATERAL Bilateral 2005   COLONOSCOPY     last with medoff unsure when    CYSTOSCOPY WITH RETROGRADE PYELOGRAM, URETEROSCOPY AND STENT PLACEMENT Left 04/09/2022   Procedure: CYSTOSCOPY WITH RETROGRADE PYELOGRAM AND STENT PLACEMENT;  Surgeon: Crista Elliot, MD;  Location[facility];  Service: Urology;  Laterality: Left;   CYSTOSCOPY WITH RETROGRADE PYELOGRAM, URETEROSCOPY AND STENT PLACEMENT Left 05/04/2022   Procedure: CYSTOSCOPY WITH RETROGRADE PYELOGRAM, URETEROSCOPY AND STENT PLACEMENT;  Surgeon: Rene Paci, MD;  Location[facility];  Service: Urology;  Laterality: Left;   CYSTOSCOPY WITH URETEROSCOPY AND STENT PLACEMENT Right 10/28/2013   Procedure: RIGHT  URETEROSCOPY AND STENT PLACEMENT;  Surgeon: Garnett Farm, MD;  Location: [facility];  Service: Urology;  Laterality: Right;   CYSTOSCOPY/URETEROSCOPY/HOLMIUM LASER/STENT PLACEMENT Left 09/06/2020   Procedure: CYSTOSCOPY/POSSIBLE URETEROSCOPY/HOLMIUM LASER/STENT PLACEMENT;  Surgeon: Belva Agee, MD;  [facility];  Service: Urology;  Laterality: Left;  30 MINS   CYSTOSCOPY/URETEROSCOPY/HOLMIUM LASER/STENT PLACEMENT Left 04/22/2022   Procedure: CYSTOSCOPY/URETEROSCOPY/HOLMIUM LASER/STENT REPLACEMENT;  Surgeon: Belva Agee, MD;  [facility];  Service: Urology;  Laterality: Left;  1 HR   CYSTOSCOPY/URETEROSCOPY/HOLMIUM LASER/STENT PLACEMENT Left 05/20/2022   Procedure: CYSTOSCOPY/URETEROSCOPY/HOLMIUM LASER/STENT EXCHANGE;  Surgeon: Belva Agee, MD;  [facility];  Service: Urology;  Laterality: Left;   EXTRACORPOREAL SHOCK WAVE LITHOTRIPSY  02/08/2009   HOLMIUM LASER APPLICATION Right 10/28/2013   Procedure: HOLMIUM LASER LITHO;  Surgeon: Garnett Farm, MD;  Location: [facility];  Service: Urology;  Laterality: Right;   LEFT KNEE SURG.  1993  &  NOV 2011   LEFT URETEROSCOPIC STONE EXTRACTION  02/23/2009   POLYPECTOMY  2014   1 polyp  per dr Urban Gibson notes- no surg path to determine polyps type but was a 5 yr recall    RADIOACTIVE SEED IMPLANT  08/08/2011   Procedure: RADIOACTIVE SEED IMPLANT;  Surgeon: Garnett Farm, MD;  Location: [facility];  Service: Urology;  Laterality: N/A;  seeds implanted 50 seeds found in bladder=none    REPLACEMENT TOTAL KNEE Left 2018   SKIN CANCER EXCISION     Burned off left side of nose, and head   TOTAL KNEE ARTHROPLASTY Right 08/2018   UPPER GASTROINTESTINAL ENDOSCOPY     URETEROSCOPY  03/15/2012   Procedure: URETEROSCOPY;  Surgeon: Garnett Farm, MD;  Location: [facility];  Service: Urology;  Laterality: Right;  Right Ueteroscopy     MEDICATIONS:  amLODipine  (NORVASC) 5 MG tablet   atorvastatin (LIPITOR) 10 MG tablet   fexofenadine (ALLEGRA) 180 MG tablet   fluticasone (FLONASE) 50 MCG/ACT nasal spray   hydrocortisone 2.5 % cream   JARDIANCE 10 MG TABS tablet   Melatonin 5 MG CAPS   omega-3 acid ethyl esters (LOVAZA) 1 g capsule   omeprazole (PRILOSEC) 20 MG capsule   Polyethyl Glycol-Propyl Glycol (SYSTANE) 0.4-0.3 % SOLN   potassium citrate (UROCIT-K) 10 MEQ (1080 MG) SR tablet   rivaroxaban (XARELTO) 10 MG TABS tablet   traMADol (ULTRAM) 50 MG tablet   traZODone (DESYREL) 100 MG tablet   vitamin B-12 (CYANOCOBALAMIN) 500 MCG tablet   VITAMIN D PO   No current facility-administered medications for this encounter.   Marcille Blanco [facility] Short Stay/Anesthesiology [facility] Phone [phone] 07/16/2023 11:38 AM

## 2023-07-24 ENCOUNTER — Other Ambulatory Visit: Payer: Self-pay

## 2023-07-24 ENCOUNTER — Encounter (HOSPITAL_COMMUNITY)
Admission: RE | Disposition: A | Discharge status: Home / Self Care | Payer: Self-pay | Source: Ambulatory Visit | Attending: Urology

## 2023-07-24 ENCOUNTER — Encounter (HOSPITAL_COMMUNITY): Payer: Self-pay | Admitting: Urology

## 2023-07-24 ENCOUNTER — Ambulatory Visit (HOSPITAL_COMMUNITY)
Admission: RE | Admit: 2023-07-24 | Discharge: 2023-07-24 | Disposition: A | Discharge status: Home / Self Care | Payer: Medicare Other | Source: Ambulatory Visit | Attending: Urology | Admitting: Urology

## 2023-07-24 ENCOUNTER — Ambulatory Visit (HOSPITAL_COMMUNITY): Payer: Medicare Other | Admitting: Medical

## 2023-07-24 ENCOUNTER — Ambulatory Visit (HOSPITAL_COMMUNITY): Payer: Medicare Other

## 2023-07-24 ENCOUNTER — Ambulatory Visit (HOSPITAL_BASED_OUTPATIENT_CLINIC_OR_DEPARTMENT_OTHER): Payer: Medicare Other

## 2023-07-24 DIAGNOSIS — N35919 Unspecified urethral stricture, male, unspecified site: Secondary | ICD-10-CM | POA: Diagnosis not present

## 2023-07-24 DIAGNOSIS — N189 Chronic kidney disease, unspecified: Secondary | ICD-10-CM | POA: Diagnosis not present

## 2023-07-24 DIAGNOSIS — E66813 Obesity, class 3: Secondary | ICD-10-CM | POA: Insufficient documentation

## 2023-07-24 DIAGNOSIS — N201 Calculus of ureter: Secondary | ICD-10-CM | POA: Diagnosis not present

## 2023-07-24 DIAGNOSIS — N182 Chronic kidney disease, stage 2 (mild): Secondary | ICD-10-CM | POA: Diagnosis not present

## 2023-07-24 DIAGNOSIS — Z6841 Body Mass Index (BMI) 40.0 and over, adult: Secondary | ICD-10-CM | POA: Insufficient documentation

## 2023-07-24 DIAGNOSIS — I129 Hypertensive chronic kidney disease with stage 1 through stage 4 chronic kidney disease, or unspecified chronic kidney disease: Secondary | ICD-10-CM | POA: Insufficient documentation

## 2023-07-24 DIAGNOSIS — N132 Hydronephrosis with renal and ureteral calculous obstruction: Secondary | ICD-10-CM | POA: Insufficient documentation

## 2023-07-24 DIAGNOSIS — E1122 Type 2 diabetes mellitus with diabetic chronic kidney disease: Secondary | ICD-10-CM | POA: Insufficient documentation

## 2023-07-24 HISTORY — PX: CYSTOSCOPY/URETEROSCOPY/HOLMIUM LASER/STENT PLACEMENT: SHX6546

## 2023-07-24 LAB — GLUCOSE, CAPILLARY
Glucose-Capillary: 114 mg/dL — ABNORMAL HIGH (ref 70–99)
Glucose-Capillary: 100 mg/dL — ABNORMAL HIGH (ref 70–99)

## 2023-07-24 SURGERY — CYSTOSCOPY/URETEROSCOPY/HOLMIUM LASER/STENT PLACEMENT
Anesthesia: General | Laterality: Left

## 2023-07-24 MED ORDER — ONDANSETRON HCL 4 MG/2ML IJ SOLN
INTRAMUSCULAR | Status: AC
  Filled 2023-07-24: qty 2

## 2023-07-24 MED ORDER — ONDANSETRON HCL 4 MG/2ML IJ SOLN
INTRAMUSCULAR | Status: DC | PRN
  Administered 2023-07-24: 4 mg via INTRAVENOUS

## 2023-07-24 MED ORDER — DEXAMETHASONE SODIUM PHOSPHATE 4 MG/ML IJ SOLN
INTRAMUSCULAR | Status: DC | PRN
  Administered 2023-07-24: 5 mg via INTRAVENOUS

## 2023-07-24 MED ORDER — ACETAMINOPHEN 10 MG/ML IV SOLN: 1000.0000 mg | Freq: Once | INTRAVENOUS | Status: DC | PRN

## 2023-07-24 MED ORDER — CHLORHEXIDINE GLUCONATE 0.12 % MT SOLN: 15.0000 mL | Freq: Once | OROMUCOSAL | Status: DC

## 2023-07-24 MED ORDER — FENTANYL CITRATE (PF) 100 MCG/2ML IJ SOLN
INTRAMUSCULAR | Status: DC | PRN
  Administered 2023-07-24 (×2): 50 ug via INTRAVENOUS

## 2023-07-24 MED ORDER — ORAL CARE MOUTH RINSE: 15.0000 mL | Freq: Once | OROMUCOSAL | Status: DC

## 2023-07-24 MED ORDER — FENTANYL CITRATE (PF) 100 MCG/2ML IJ SOLN
INTRAMUSCULAR | Status: AC
  Filled 2023-07-24: qty 2

## 2023-07-24 MED ORDER — DEXAMETHASONE SODIUM PHOSPHATE 10 MG/ML IJ SOLN
INTRAMUSCULAR | Status: DC | PRN
  Administered 2023-07-24: 5 mg via INTRAVENOUS

## 2023-07-24 MED ORDER — OXYCODONE-ACETAMINOPHEN 5-325 MG PO TABS: 1.0000 | ORAL_TABLET | ORAL | 0 refills | Status: DC | PRN

## 2023-07-24 MED ORDER — OXYCODONE HCL 5 MG/5ML PO SOLN: 5.0000 mg | Freq: Once | ORAL | Status: DC | PRN

## 2023-07-24 MED ORDER — DROPERIDOL 2.5 MG/ML IJ SOLN: 0.6250 mg | Freq: Once | INTRAMUSCULAR | Status: DC | PRN

## 2023-07-24 MED ORDER — FENTANYL CITRATE PF 50 MCG/ML IJ SOSY: 25.0000 ug | PREFILLED_SYRINGE | INTRAMUSCULAR | Status: DC | PRN

## 2023-07-24 MED ORDER — DEXAMETHASONE SODIUM PHOSPHATE 10 MG/ML IJ SOLN
INTRAMUSCULAR | Status: AC
  Filled 2023-07-24: qty 1

## 2023-07-24 MED ORDER — PROPOFOL 10 MG/ML IV BOLUS
INTRAVENOUS | Status: DC | PRN
  Administered 2023-07-24: 150 mg via INTRAVENOUS
  Administered 2023-07-24: 50 mg via INTRAVENOUS

## 2023-07-24 MED ORDER — CIPROFLOXACIN IN D5W 400 MG/200ML IV SOLN
400.0000 mg | Freq: Two times a day (BID) | INTRAVENOUS | Status: DC
  Administered 2023-07-24: 400 mg via INTRAVENOUS
  Filled 2023-07-24: qty 200

## 2023-07-24 MED ORDER — IOHEXOL 300 MG/ML  SOLN
INTRAMUSCULAR | Status: DC | PRN
  Administered 2023-07-24: 19 mL

## 2023-07-24 MED ORDER — ACETAMINOPHEN 500 MG PO TABS
1000.0000 mg | ORAL_TABLET | Freq: Once | ORAL | Status: AC
  Administered 2023-07-24: 1000 mg via ORAL
  Filled 2023-07-24: qty 2

## 2023-07-24 MED ORDER — SODIUM CHLORIDE 0.9 % IR SOLN
Status: DC | PRN
  Administered 2023-07-24: 3000 mL

## 2023-07-24 MED ORDER — LIDOCAINE HCL (CARDIAC) PF 100 MG/5ML IV SOSY
PREFILLED_SYRINGE | INTRAVENOUS | Status: DC | PRN
  Administered 2023-07-24: 80 mg via INTRATRACHEAL

## 2023-07-24 MED ORDER — LACTATED RINGERS IV SOLN: INTRAVENOUS | Status: DC

## 2023-07-24 MED ORDER — OXYCODONE HCL 5 MG PO TABS: 5.0000 mg | ORAL_TABLET | Freq: Once | ORAL | Status: DC | PRN

## 2023-07-24 SURGICAL SUPPLY — 22 items
BAG URO CATCHER STRL LF (MISCELLANEOUS) ×1 IMPLANT
BASKET ZERO TIP NITINOL 2.4FR (BASKET) IMPLANT
BENZOIN TINCTURE PRP APPL 2/3 (GAUZE/BANDAGES/DRESSINGS) IMPLANT
CATH URETERAL DUAL LUMEN 10F (MISCELLANEOUS) IMPLANT
CATH URETL OPEN 5X70 (CATHETERS) ×1 IMPLANT
CLOTH BEACON ORANGE TIMEOUT ST (SAFETY) ×1 IMPLANT
DRSG TEGADERM 2-3/8X2-3/4 SM (GAUZE/BANDAGES/DRESSINGS) IMPLANT
FIBER LASER MOSES 200 DFL (Laser) IMPLANT
GLOVE BIOGEL M 7.0 STRL (GLOVE) ×1 IMPLANT
GOWN STRL REUS W/ TWL XL LVL3 (GOWN DISPOSABLE) ×1 IMPLANT
GUIDEWIRE STR DUAL SENSOR (WIRE) ×2 IMPLANT
GUIDEWIRE ZIPWRE .038 STRAIGHT (WIRE) IMPLANT
KIT TURNOVER KIT A (KITS) IMPLANT
LASER FIB FLEXIVA PULSE ID 365 (Laser) IMPLANT
MANIFOLD NEPTUNE II (INSTRUMENTS) ×1 IMPLANT
PACK CYSTO (CUSTOM PROCEDURE TRAY) ×1 IMPLANT
PAD PREP 24X48 CUFFED NSTRL (MISCELLANEOUS) ×1 IMPLANT
SHEATH DILATOR SET 8/10 (MISCELLANEOUS) IMPLANT
SHEATH NAVIGATOR HD 12/14X46 (SHEATH) IMPLANT
TRACTIP FLEXIVA PULS ID 200XHI (Laser) IMPLANT
TUBING CONNECTING 10 (TUBING) ×1 IMPLANT
TUBING UROLOGY SET (TUBING) ×1 IMPLANT

## 2023-07-24 NOTE — Transfer of Care (Signed)
Immediate Anesthesia Transfer of Care Note  Patient: Joseph Pham  Procedure(s) Performed: DIAGNOSTIC URETEROSCOPY (Left)  Patient Location: PACU  Anesthesia Type:General  Level of Consciousness: awake and alert   Airway & Oxygen Therapy: Patient Spontanous Breathing  Post-op Assessment: Report given to RN and Post -op Vital signs reviewed and stable  Post vital signs: Reviewed and stable  Last Vitals:  Vitals Value Taken Time  BP    Temp    Pulse 52 07/24/23 1502  Resp 8 07/24/23 1502  SpO2 95 % 07/24/23 1502  Vitals shown include unfiled device data.  Last Pain:  Vitals:   07/24/23 1214  TempSrc:   PainSc: 0-No pain         Complications: No notable events documented.

## 2023-07-24 NOTE — Anesthesia Postprocedure Evaluation (Signed)
Anesthesia Post Note  Patient: Joseph Pham  Procedure(s) Performed: DIAGNOSTIC URETEROSCOPY (Left)     Patient location during evaluation: PACU Anesthesia Type: General Level of consciousness: awake and alert Pain management: pain level controlled Vital Signs Assessment: post-procedure vital signs reviewed and stable Respiratory status: spontaneous breathing, nonlabored ventilation, respiratory function stable and patient connected to nasal cannula oxygen Cardiovascular status: blood pressure returned to baseline and stable Postop Assessment: no apparent nausea or vomiting Anesthetic complications: no   No notable events documented.  Last Vitals:  Vitals:   07/24/23 1555 07/24/23 1600  BP: (!) 150/75 136/78  Pulse: (!) 54 (!) 52  Resp:    Temp: (!) 36.3 C   SpO2: 96% 97%    Last Pain:  Vitals:   07/24/23 1555  TempSrc:   PainSc: 0-No pain                 Carey Nation

## 2023-07-24 NOTE — Anesthesia Procedure Notes (Signed)
Procedure Name: LMA Insertion Date/Time: 07/24/2023 2:14 PM  Performed by: Deri Fuelling, CRNAPre-anesthesia Checklist: Patient identified, Emergency Drugs available, Suction available and Patient being monitored Patient Re-evaluated:Patient Re-evaluated prior to induction Oxygen Delivery Method: Circle system utilized Preoxygenation: Pre-oxygenation with 100% oxygen Induction Type: IV induction Ventilation: Mask ventilation without difficulty LMA: LMA inserted LMA Size: 4.0 Tube type: Oral Number of attempts: 1 Airway Equipment and Method: Stylet and Oral airway Placement Confirmation: positive ETCO2 and breath sounds checked- equal and bilateral Tube secured with: Tape Dental Injury: Teeth and Oropharynx as per pre-operative assessment

## 2023-07-24 NOTE — H&P (Signed)
Urology Preoperative H&P   Chief Complaint: Left ureteral stone  History of Present Illness: Joseph Pham is a 73 y.o. male with a left ureteral stone here for cysto, left URS/LL, L stent. Denies fevers or chills. No dysuria. He was treated appropriately for a UTI with one week course of antibiotics.    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   Complication of anesthesia    low heart rate during lithotripsy procedure   Deviated septum 07/19/2019   Diabetes (HCC) 03/16/2017   Essential hypertension 10/14/2015   Family history of adverse reaction to anesthesia    sister coded after surgery   GERD (gastroesophageal reflux disease)    Heart murmur    Hematuria    Hemorrhoid    History of kidney stones    Hyperlipidemia    Impingement of left ankle joint 03/06/2021   Kidney stone 03/16/2017   Lumbar radiculopathy 03/16/2017   Nasal turbinate hypertrophy 07/19/2019   OSA on CPAP    Paronychia 08/16/2015   PE (pulmonary thromboembolism) (HCC) 02/2019   Posterior tibialis tendinitis of both lower extremities 02/02/2020   Pronation deformity of both feet 02/02/2020   Prostate cancer (HCC) 03/16/2017   Skin cancer    Tibialis posterior tendinitis 02/11/2021   Tinnitus, bilateral 08/31/2019   UTI (urinary tract infection) 04/09/2022   Wears glasses     Past Surgical History:  Procedure Laterality Date   BACK SURGERY  2019   rod and screws    BILATERAL INGUINAL HERNIA REPAIR  1989   CARDIAC CATHETERIZATION  01/11/2004   NO EVIDENCE  SIGNIFICANT EPICARDIAL FLOW LIMITING 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   CARPAL TUNNEL RELEASE Left 06/2016   CARPAL TUNNEL RELEASE Right 03/2017   CATARACT EXTRACTION, BILATERAL Bilateral 2005   COLONOSCOPY     last with medoff unsure when    CYSTOSCOPY WITH RETROGRADE PYELOGRAM, URETEROSCOPY AND STENT PLACEMENT Left 04/09/2022   Procedure: CYSTOSCOPY WITH RETROGRADE PYELOGRAM AND STENT PLACEMENT;  Surgeon: Crista Elliot, MD;  Location: [facility];  Service: Urology;  Laterality: Left;   CYSTOSCOPY WITH RETROGRADE PYELOGRAM, URETEROSCOPY AND STENT PLACEMENT Left 05/04/2022   Procedure: CYSTOSCOPY WITH RETROGRADE PYELOGRAM, URETEROSCOPY AND STENT PLACEMENT;  Surgeon: Rene Paci, MD;  Location: [facility];  Service: Urology;  Laterality: Left;   CYSTOSCOPY WITH URETEROSCOPY AND STENT PLACEMENT Right 10/28/2013   Procedure: RIGHT URETEROSCOPY AND STENT PLACEMENT;  Surgeon: Garnett Farm, MD;  Location[facility];  Service: Urology;  Laterality: Right;   CYSTOSCOPY/URETEROSCOPY/HOLMIUM LASER/STENT PLACEMENT Left 09/06/2020   Procedure: CYSTOSCOPY/POSSIBLE URETEROSCOPY/HOLMIUM LASER/STENT PLACEMENT;  Surgeon: Belva Agee, MD;  Location: [facility];  Service: Urology;  Laterality: Left;  30 MINS   CYSTOSCOPY/URETEROSCOPY/HOLMIUM LASER/STENT PLACEMENT Left 04/22/2022   Procedure: CYSTOSCOPY/URETEROSCOPY/HOLMIUM LASER/STENT REPLACEMENT;  Surgeon: Belva Agee, MD;  Location: [facility];  Service: Urology;  Laterality: Left;  1 HR   CYSTOSCOPY/URETEROSCOPY/HOLMIUM LASER/STENT PLACEMENT Left 05/20/2022   Procedure: CYSTOSCOPY/URETEROSCOPY/HOLMIUM LASER/STENT EXCHANGE;  Surgeon: Benancio Deeds,  Otho Darner, MD;  Location: [facility];  Service: Urology;  Laterality: Left;   EXTRACORPOREAL SHOCK WAVE LITHOTRIPSY  02/08/2009   HOLMIUM LASER APPLICATION Right 10/28/2013   Procedure: HOLMIUM LASER LITHO;  Surgeon: Garnett Farm, MD;  Location: [facility];  Service: Urology;  Laterality: Right;   LEFT KNEE SURG.  1993  &  NOV 2011   LEFT URETEROSCOPIC STONE EXTRACTION  02/23/2009   POLYPECTOMY  2014   1 polyp per dr Urban Gibson notes- no surg path to determine polyps type but was a 5 yr recall    RADIOACTIVE SEED IMPLANT  08/08/2011   Procedure: RADIOACTIVE SEED IMPLANT;  Surgeon: Garnett Farm, MD;  Location: [facility];  Service: Urology;  Laterality: N/A;  seeds implanted 50 seeds found in bladder=none    REPLACEMENT TOTAL KNEE Left 2018   SKIN CANCER EXCISION     Burned off left side of nose, and head   TOTAL KNEE ARTHROPLASTY Right 08/2018   UPPER GASTROINTESTINAL ENDOSCOPY     URETEROSCOPY  03/15/2012   Procedure: URETEROSCOPY;  Surgeon: Garnett Farm, MD;  Location: [facility];  Service: Urology;  Laterality: Right;  Right Ueteroscopy     Allergies:  Allergies  Allergen Reactions   Aspirin Anaphylaxis   Doxycycline Nausea And Vomiting   Amoxicillin Itching   Chlorhexidine Gluconate Rash    Family History  Problem Relation Age of Onset   Cancer Mother    Diabetes Father    CAD Father    Heart attack Father    Diabetes Sister    Colon polyps Sister    Colon polyps Brother    Colon cancer Neg Hx    Esophageal cancer Neg Hx    Rectal cancer Neg Hx    Stomach cancer Neg Hx     Social History:  reports that he quit smoking about 30 years ago. His smoking use included cigarettes. He started smoking about 40 years ago. He has quit using smokeless tobacco.  His smokeless tobacco use included chew. He reports that he does not drink alcohol and does not use drugs.  ROS: A complete review of systems was performed.  All systems are negative except for pertinent findings as noted.  Physical Exam:  Vital signs in last 24 hours: Temp:  [98.1 F (36.7 C)] 98.1 F (36.7 C) (02/21 1149) Pulse Rate:  [72] 72 (02/21 1149) Resp:  [16] 16 (02/21 1149) BP: (158)/(89) 158/89 (02/21 1149) SpO2:   [97 %] 97 % (02/21 1149) Constitutional:  Alert and oriented, No acute distress Cardiovascular: Regular rate and rhythm Respiratory: Normal respiratory effort, Lungs clear bilaterally GI: Abdomen is soft, nontender, nondistended, no abdominal masses GU: No CVA tenderness Lymphatic: No lymphadenopathy Neurologic: Grossly intact, no focal deficits Psychiatric: Normal mood and affect  Laboratory Data:  No results for input(s): "WBC", "HGB", "HCT", "PLT" in the last 72 hours.  No results for input(s): "NA", "K", "CL", "GLUCOSE", "BUN", "CALCIUM", "CREATININE" in the last 72 hours.  Invalid input(s): "CO3"   Results for orders placed or performed during the hospital encounter of 07/24/23 (from the past 24 hours)  Glucose, capillary     Status: Abnormal   Collection Time: 07/24/23 11:52 AM  Result Value Ref Range   Glucose-Capillary 114 (H) 70 - 99 mg/dL   No results found for this or any previous visit (from the past 240 hours).  Renal Function: No results for input(s): "CREATININE" in the last 168 hours. Estimated  Creatinine Clearance: 33.5 mL/min (A) (by C-G formula based on SCr of 2.38 mg/dL (H)).  Radiologic Imaging: No results found.  I independently reviewed the above imaging studies.  Assessment and Plan Joseph Pham is a 73 y.o. male with a left ureteral stone here for cysto, L RPG, L URS/LL, L stent.  -The risks, benefits and alternatives of cystoscopy with left URS/LL, left JJ stent placement was discussed with the patient.  Risks include, but are not limited to: bleeding, urinary tract infection, ureteral injury, ureteral stricture disease, chronic pain, urinary symptoms, bladder injury, stent migration, the need for nephrostomy tube placement, MI, CVA, DVT, PE and the inherent risks with general anesthesia.  The patient voices understanding and wishes to proceed.    Matt R. Nishat Livingston MD 07/24/2023, 11:58 AM  [facility] Pager: [phone]): [phone]

## 2023-07-24 NOTE — Op Note (Signed)
Operative Note  Preoperative diagnosis:  1.  Left distal ureteral stone  Postoperative diagnosis: 1.  Left ureteral stricture  Procedure(s): 1.  Cystoscopy 2. Left diagnostic ureteroscopy 3. Left retrograde pyelogram 4. Fluoroscopy with intraoperative interpretation  Surgeon: Jettie Pagan, MD  Assistants:  None  Anesthesia:  General  Complications:  None  EBL:  Minimal  Specimens: 1. None  Drains/Catheters: 1.  None  Intraoperative findings:   Cystoscopy demonstrated mildly obstructing prostate with no suspicious bladder lesions Left retrograde pyelogram with obstruction of the distal left ureter with no passage of contrast beyond 2 cm of the distal ureter. Left diagnostic ureteroscopy demonstrated a blind-ending ureter approximately 2 cm proximal to the left UVJ.  Unable to pass a wire into the proximal ureter beyond this level obstruction.  Indication:  Joseph Pham is a 73 y.o. male with history of urolithiasis who underwent cystoscopy with left ureteroscopy with laser lithotripsy most recently in 05/2022 by one of my partners for the treatment of a distal left ureteral stone as well as a renal stone.  Apparently, postoperatively he had some left-sided flank pain and CT imaging a few months l ater revealed a distal ureteral stone.  Ultimately, his pain dissipated and was assumed to have passed the stone.  Recently, he was found to have an elevated creatinine which prompted imaging that revealed left hydronephrosis.  CT A/P revealed distal left ureteral stone with hydronephrosis and some atrophy of the left kidney.  He is being brought today for definitive ureteroscopy with treatment of his ureteral stone.  Description of procedure: After informed consent was obtained from the patient, the patient was identified and taken to the operating room and placed in the supine position.  General anesthesia was administered as well as perioperative IV antibiotics.  At the  beginning of the case, a time-out was performed to properly identify the patient, the surgery to be performed, and the surgical site.  Sequential compression devices were applied to the lower extremities at the beginning of the case for DVT prophylaxis.  The patient was then placed in the dorsal lithotomy supine position, prepped and draped in sterile fashion.  Preliminary scout fluoroscopy revealed no areas of calcification. We then passed the 21-French rigid cystoscope through the urethra and into the bladder under vision without any difficulty , noting a normal urethra without strictures and a mildly obstructing prostate.  A systematic evaluation of the bladder revealed no evidence of any suspicious bladder lesions.  Ureteral orifices were in normal position.    Under cystoscopic and fluoroscopic guidance, I cannulated the left ureteral orifice with a 5 [place] open-ended ureteral catheter and gently performed a retrograde pyelogram which revealed blind-ending of the left distal ureter about 2 cm proximal to the left UPJ.  No contrast passed beyond this area and instead filled the bladder.  I then inserted a semirigid ureteroscope and advanced this into the left ureteral orifice and advanced as approximately 2 cm wire I encountered blind-ending ureter.  I probed the area multiple times with a wire however the wire would not advance into the proximal ureter.  Thus, was unable to treat a stone or place a stent.  The bladder was emptied and the cystoscope was then removed.    Plan:  Patient will be discharged home.  Follow up with me to discuss next steps, which would possibly be a nephroureteral stent placed by IR.   Matt R. Ajna Moors MD [facility]  Pager: [phone]

## 2023-07-24 NOTE — Anesthesia Preprocedure Evaluation (Addendum)
Anesthesia Evaluation  Patient identified by MRN, date of birth, ID band Patient awake    Reviewed: Allergy & Precautions, NPO status , Patient's Chart, lab work & pertinent test results  History of Anesthesia Complications Negative for: history of anesthetic complications  Airway Mallampati: II  TM Distance: >3 FB Neck ROM: Full    Dental  (+) Dental Advisory Given, Teeth Intact,    Pulmonary sleep apnea , former smoker, PE   Pulmonary exam normal        Cardiovascular hypertension, Normal cardiovascular exam     Neuro/Psych negative neurological ROS     GI/Hepatic Neg liver ROS,GERD  ,,  Endo/Other  diabetes, Type 2  Class 3 obesity  Renal/GU CRFRenal disease (Ureteral stone)   Prostate cancer    Musculoskeletal negative musculoskeletal ROS (+)    Abdominal   Peds  Hematology negative hematology ROS (+)   Anesthesia Other Findings   Reproductive/Obstetrics                             Anesthesia Physical Anesthesia Plan  ASA: 3  Anesthesia Plan: General   Post-op Pain Management: Tylenol PO (pre-op)*   Induction: Intravenous  PONV Risk Score and Plan: 2 and Ondansetron, Dexamethasone and Treatment may vary due to age or medical condition  Airway Management Planned: LMA  Additional Equipment: None  Intra-op Plan:   Post-operative Plan: Extubation in OR  Informed Consent: I have reviewed the patients History and Physical, chart, labs and discussed the procedure including the risks, benefits and alternatives for the proposed anesthesia with the patient or authorized representative who has indicated his/her understanding and acceptance.     Dental advisory given  Plan Discussed with: CRNA  Anesthesia Plan Comments:        Anesthesia Quick Evaluation

## 2023-07-24 NOTE — OR Nursing (Signed)
Paged Dr. Cardell Peach to clarify consent order. Posting is for L side, patient states L side, and consent order is for R side. Waiting on clarification of laterality.  Spoke with Dr. Cardell Peach- per MD he looked at the CT scan and verified that procedure is to be on LEFT side. Verbal order given to amend order to state LEFT side.

## 2023-07-25 ENCOUNTER — Encounter (HOSPITAL_COMMUNITY): Payer: Self-pay | Admitting: Urology

## 2023-07-27 ENCOUNTER — Telehealth: Payer: Self-pay | Admitting: Cardiology

## 2023-07-27 MED ORDER — AMLODIPINE BESYLATE 5 MG PO TABS: 5.0000 mg | ORAL_TABLET | Freq: Every day | ORAL | 3 refills | Status: DC

## 2023-07-27 NOTE — Telephone Encounter (Signed)
 Pt c/o medication issue:  1. Name of Medication: amLODipine (NORVASC) 5 MG tablet   2. How are you currently taking this medication (dosage and times per day)? Take 1 tablet (5 mg total) by mouth daily.   3. Are you having a reaction (difficulty breathing--STAT)? No  4. What is your medication issue? Patient stated when he saw PA Jari Favre she changed the dosage from 2.5 MG  to 5 MG. Patient stated he received a letter in the mail from [facility] saying they need [place] to update [facility] with the correct dosage amount for the patient. Please advise.

## 2023-07-27 NOTE — Telephone Encounter (Signed)
 Sent updated prescription of Amlodipine 5 mg to [facility] per patient request.  Called patient and informed him medication had been sent in.  He thanked me for the call and had no additional questions.

## 2023-08-02 IMAGING — CT CT CHEST W/ CM
2 of 4 series · 15 of 36 positions shown, 18 images · IV contrast (omnipaque)
Comparison: Chest radiograph dated 01/26/2021

CLINICAL DATA: Chest trauma

EXAM:
CT CHEST WITH CONTRAST
TECHNIQUE: Multidetector CT imaging of the chest was performed during
intravenous contrast administration.
CONTRAST:  75mL OMNIPAQUE IOHEXOL 350 MG/ML SOLN

[Series 11: chest w · axial · 0.98mm/px · z∈[-410,-106]mm · 12 of 180 slices shown, 15 images]
[im 14/180  mediastinal]
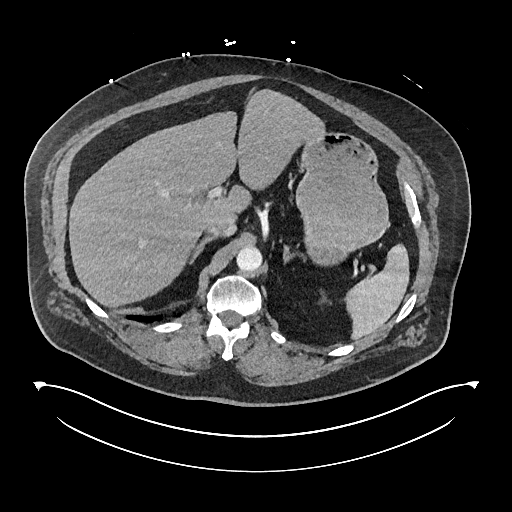
[im 14/180  lung]
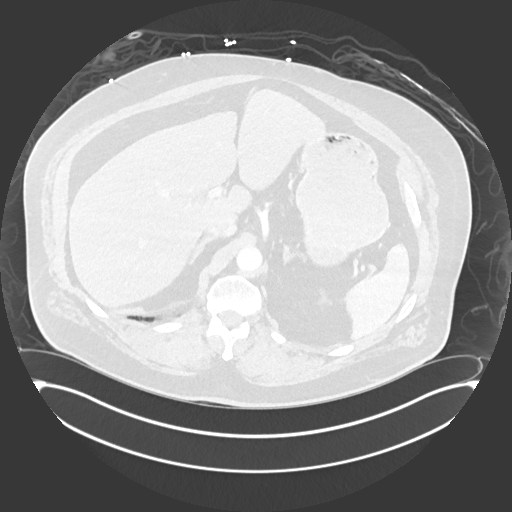
[im 28/180  lung]
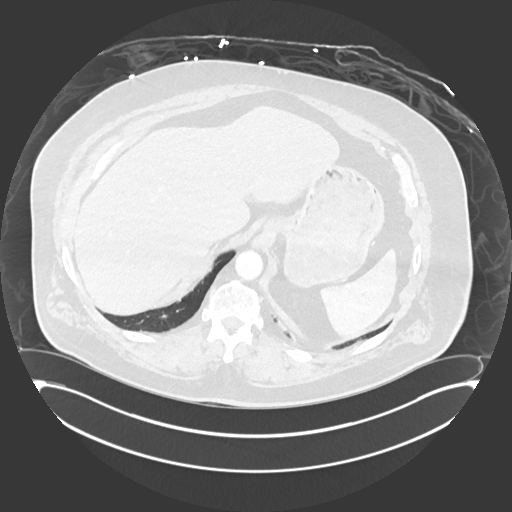
[im 42/180  lung]
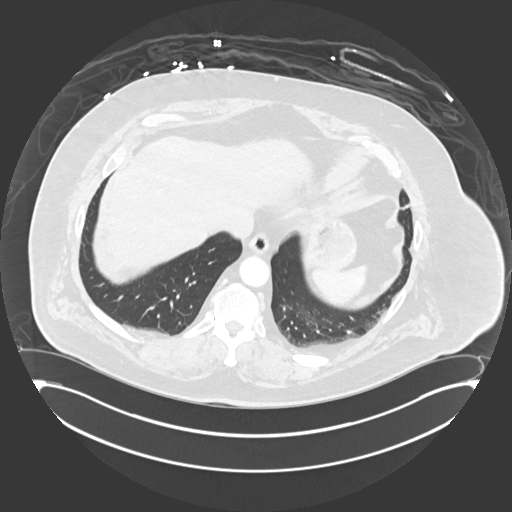
[im 56/180  lung]
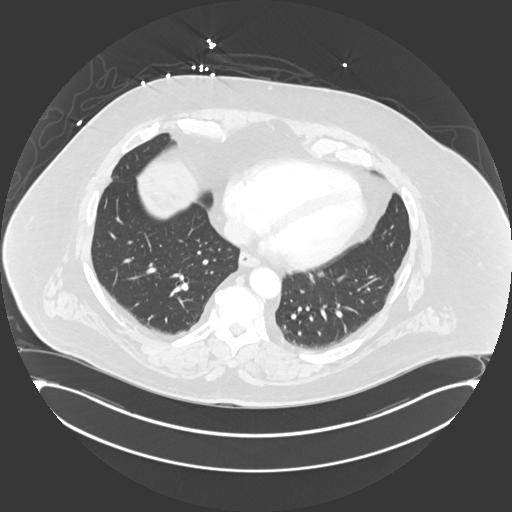
[im 69/180  mediastinal]
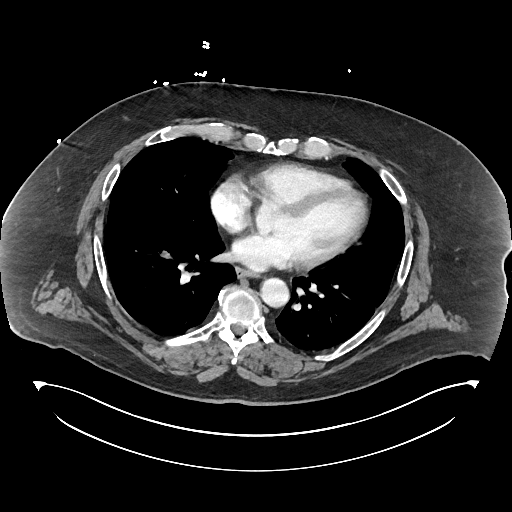
[im 69/180  lung]
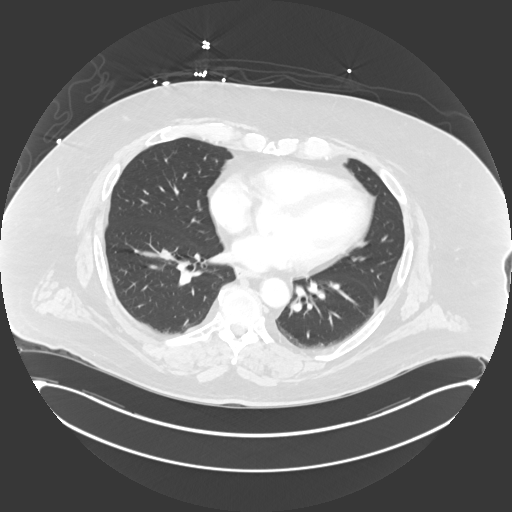
[im 83/180  lung]
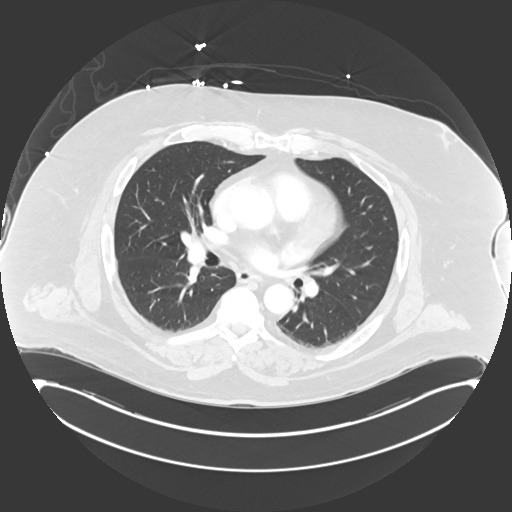
[im 97/180  lung]
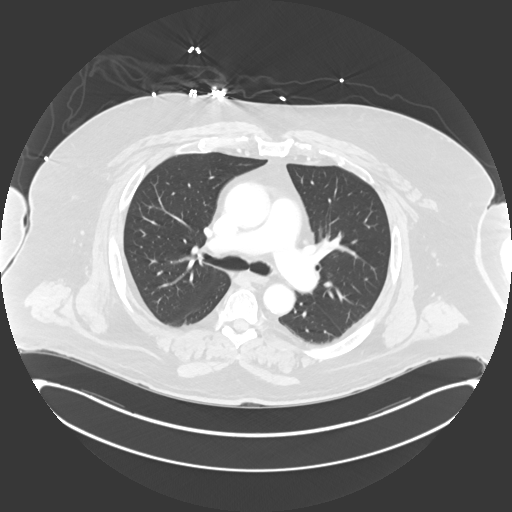
[im 111/180  lung]
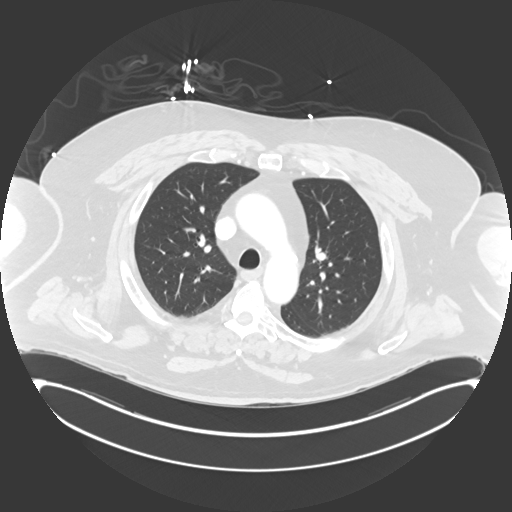
[im 124/180  mediastinal]
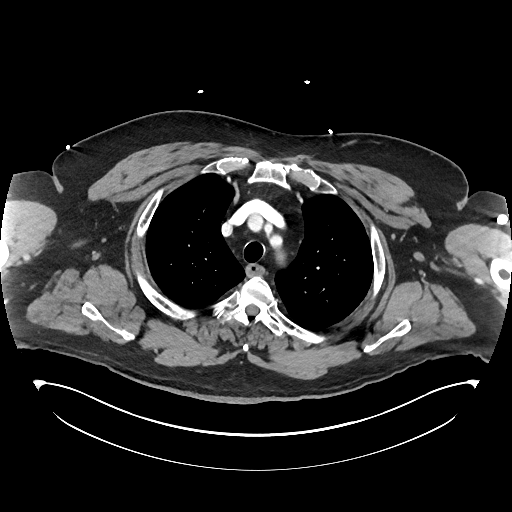
[im 124/180  lung]
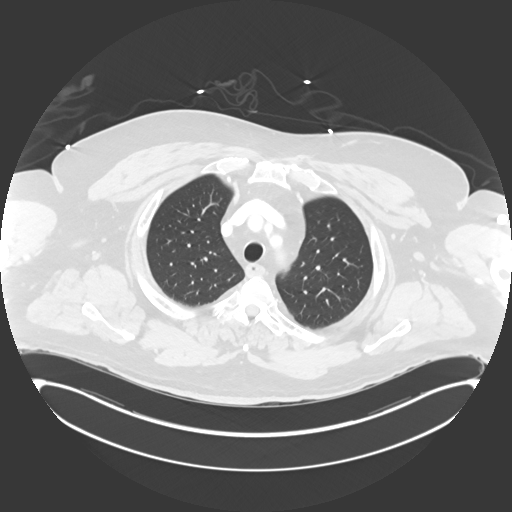
[im 138/180  lung]
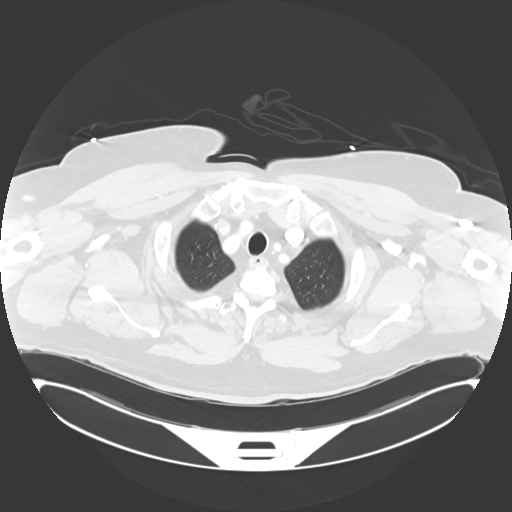
[im 152/180  lung]
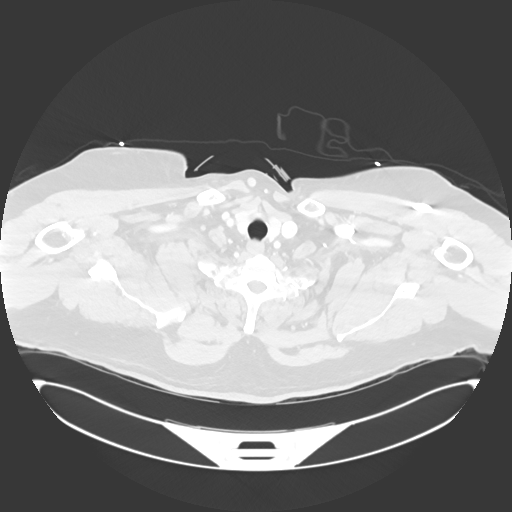
[im 166/180  lung]
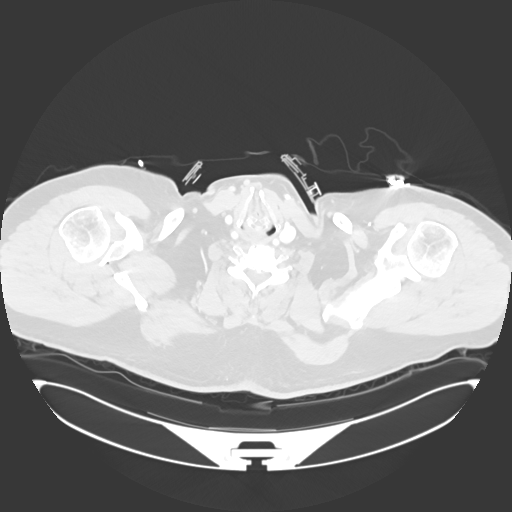

[Series 14: cor · coronal · 0.75mm/px · 3 of 169 slices shown]
[im 34/169  lung]
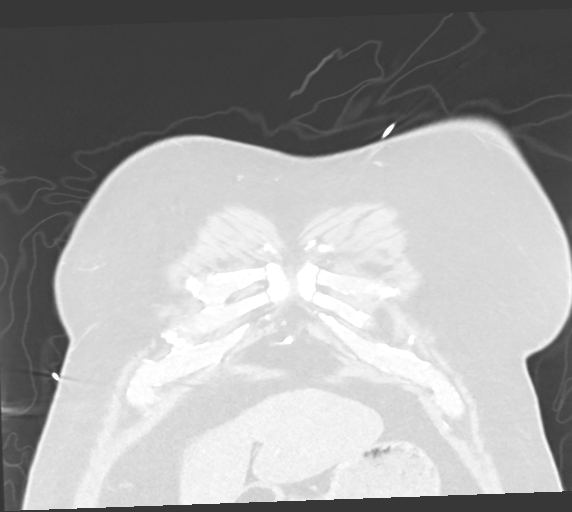
[im 68/169  lung]
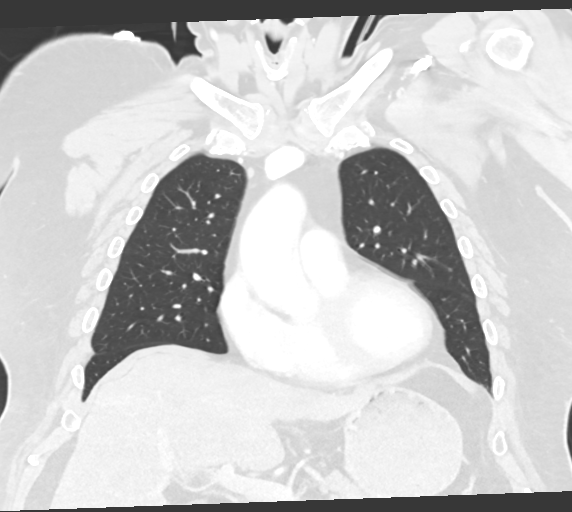
[im 101/169  lung]
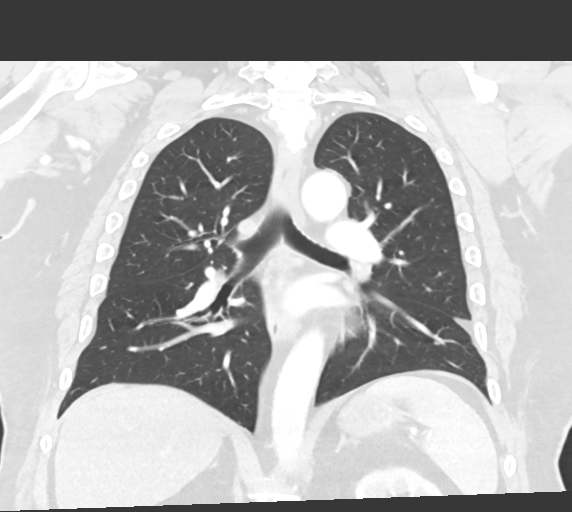

[15 of 36 positions shown; findings below may reference images not displayed]

FINDINGS: Cardiovascular: No evidence of traumatic aortic injury. Mild
atherosclerotic calcifications.

The heart is normal in size.  No pericardial effusion.

Mild coronary atherosclerosis of the right coronary artery.

Mediastinum/Nodes: No evidence of anterior mediastinal hematoma.

No suspicious mediastinal lymphadenopathy.

Visualized thyroid is unremarkable.

Lungs/Pleura: Minimal dependent atelectasis in the bilateral lower
lobes.

No focal consolidation.

No suspicious pulmonary nodules.

No pleural effusion or pneumothorax.

Upper Abdomen: Visualized upper abdomen is grossly unremarkable.

Musculoskeletal: No fracture is seen. Sternum, clavicles, scapulae,
and bilateral ribs are intact.

Mild degenerative changes of the visualized thoracolumbar spine.
Vertebral body heights are maintained.
IMPRESSION: No evidence of traumatic injury to the chest.

Aortic Atherosclerosis (H1PQR-T97.7).

## 2023-08-02 IMAGING — DX DG KNEE COMPLETE 4+V*R*
4 series · 4 of 4 positions shown · non-contrast
Comparison: None.

CLINICAL DATA: Fall and trauma to the right knee.

EXAM:
RIGHT KNEE - COMPLETE 4+ VIEW

[knee ap]
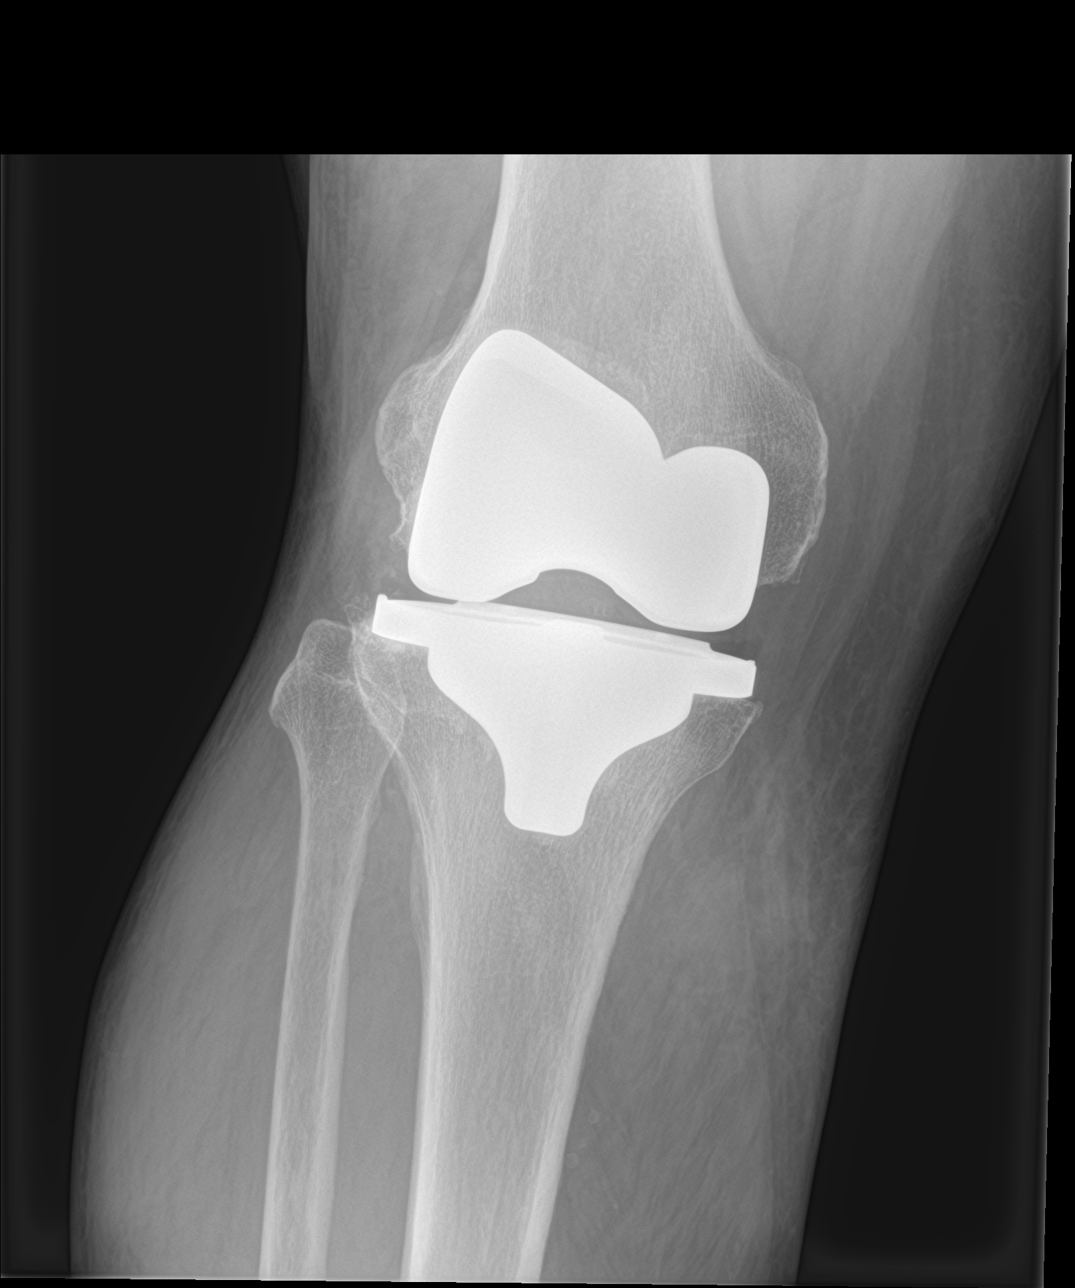

[knee lat]
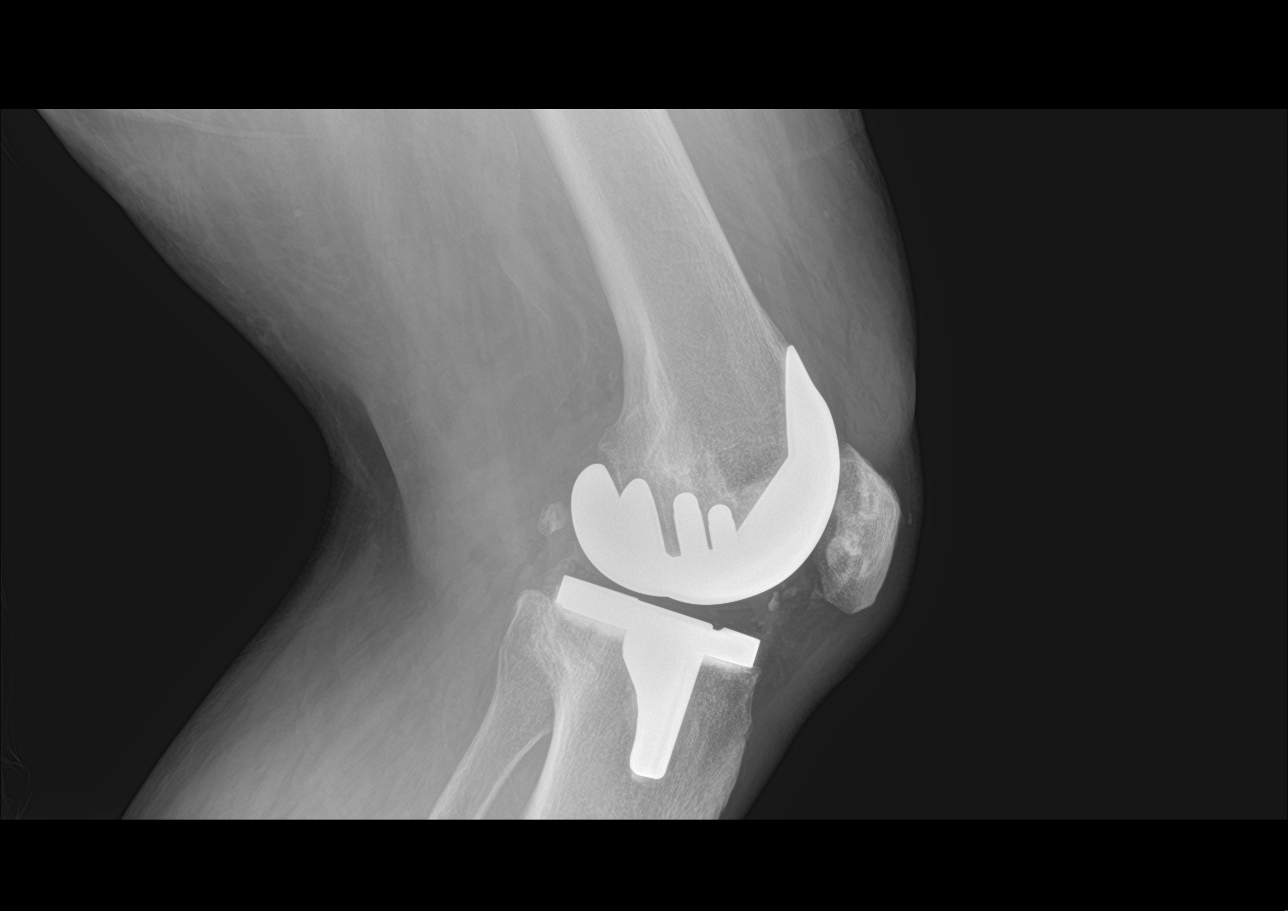

[knee obl (1 of 2)]
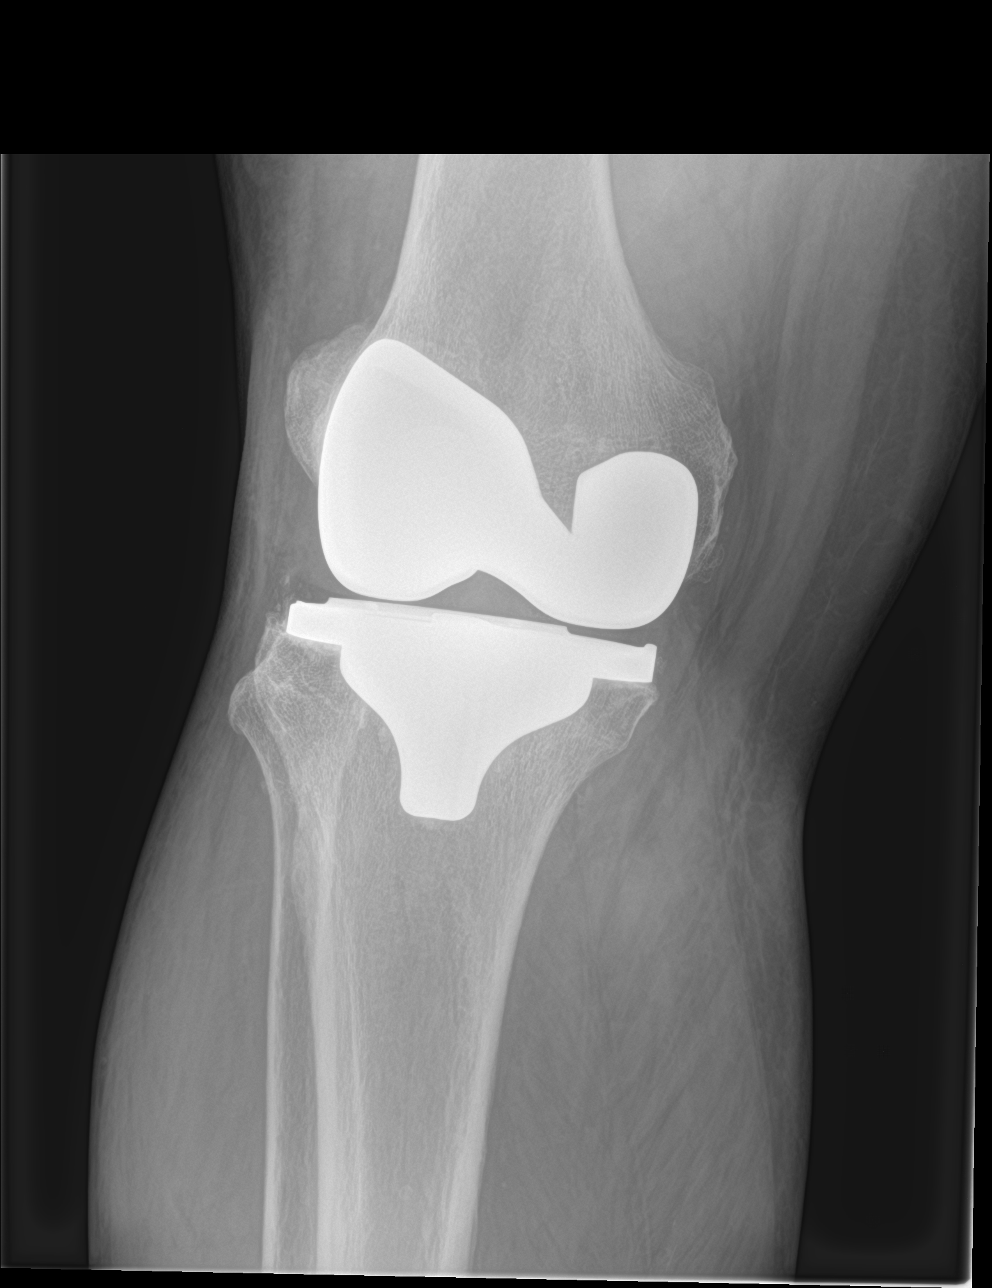

[knee obl (2 of 2)]
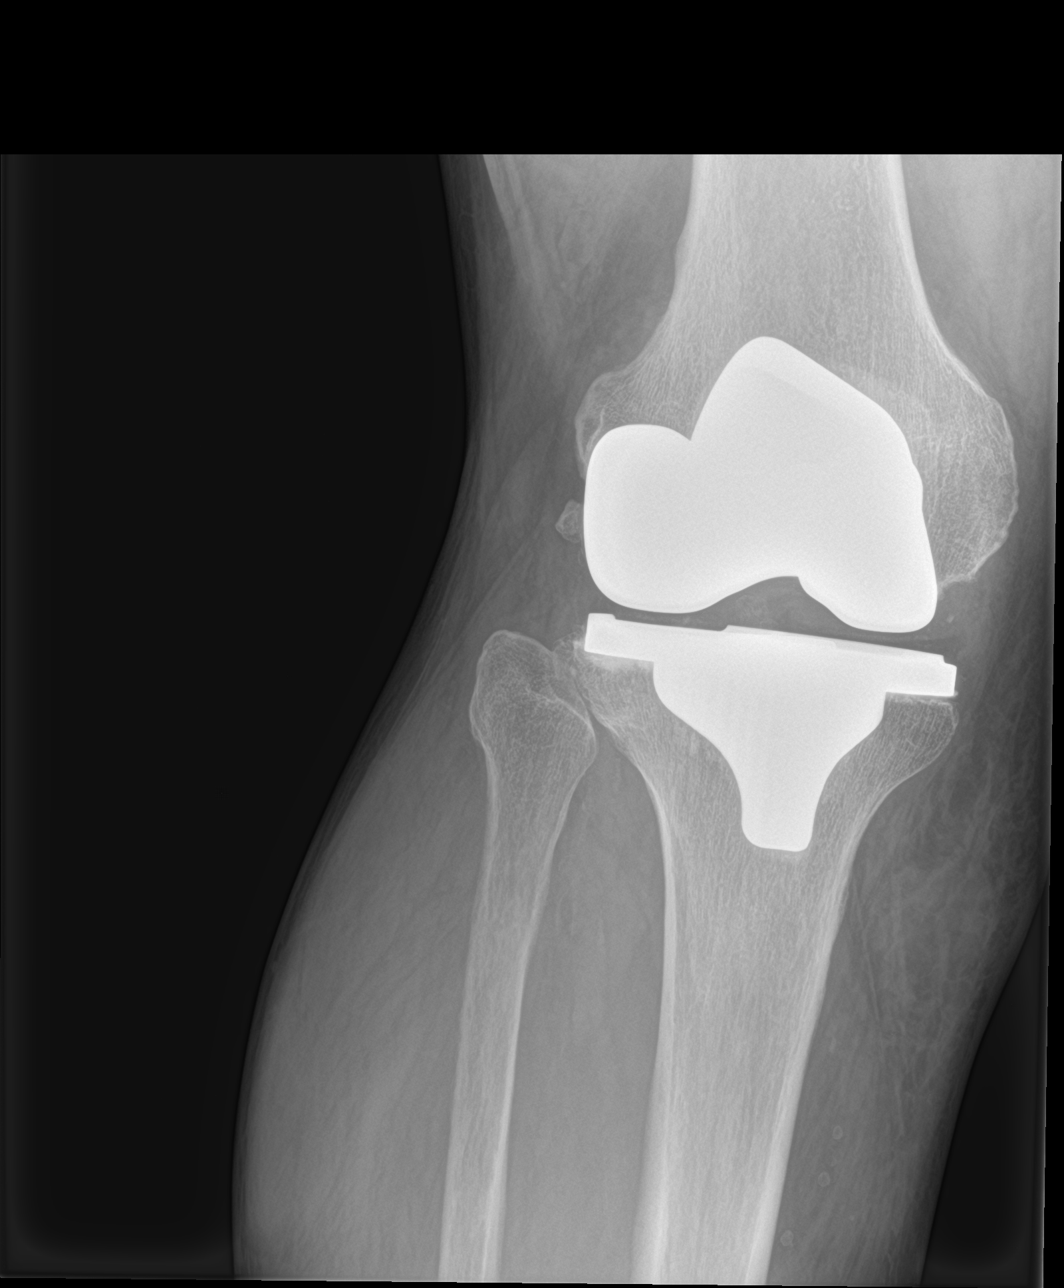

[4 of 4 positions shown; findings below may reference images not displayed]

FINDINGS: There is a total right knee arthroplasty. The arthroplasty
components appear intact and anatomic alignment. No evidence of
loosening. There is no acute fracture or dislocation. The bones are
osteopenic. No joint effusion. The soft tissues unremarkable.
IMPRESSION: 1. No acute fracture or dislocation.
2. Right knee arthroplasty appears intact and anatomic alignment.

## 2023-08-06 DIAGNOSIS — N130 Hydronephrosis with ureteropelvic junction obstruction: Secondary | ICD-10-CM | POA: Diagnosis not present

## 2023-08-06 DIAGNOSIS — N202 Calculus of kidney with calculus of ureter: Secondary | ICD-10-CM | POA: Diagnosis not present

## 2023-08-12 ENCOUNTER — Other Ambulatory Visit (HOSPITAL_COMMUNITY): Payer: Self-pay | Admitting: Urology

## 2023-08-12 ENCOUNTER — Telehealth: Payer: Self-pay | Admitting: *Deleted

## 2023-08-12 ENCOUNTER — Encounter (HOSPITAL_COMMUNITY): Payer: Self-pay

## 2023-08-12 DIAGNOSIS — N1339 Other hydronephrosis: Secondary | ICD-10-CM

## 2023-08-12 NOTE — Telephone Encounter (Signed)
 Edd Fabian, FNP sent staff message to preop call back to put clearance request into the chart. I called Joseph Pham and 1st asked if she would please just send the clearance request to our preop team s per our practice has a protocol to follow for the cardiologist and the preop APP. I stated sending to personal in box for APP and MD's it may be missed or not addressed in a timely manner.   [name] tells me that she received notes from [name]. I confirmed clearance and that she does not need anything further. [name] confirmed yes they has have what they need.

## 2023-08-12 NOTE — Progress Notes (Signed)
 RE: Xarelto Received: Today Baldo Daub, MD  Denita Lung Yes thank you the only question is when I will be resumed usually it is 48 hours afterwards but will be at the discretion of the operator.  As always thank you for your work

## 2023-08-12 NOTE — Telephone Encounter (Signed)
-----   Message from Ronney Asters sent at 08/12/2023 10:14 AM EDT ----- Regarding: FW: Xarelto  Please add to preop pool.  Thank you.  Thomasene Ripple. Cleaver NP-C  [image]   08/12/2023, 10:15 AM [facility] [address] Office [phone] Fax [phone] ----- Message ----- From: Denita Lung Sent: 08/12/2023  10:00 AM EDT To: Baldo Daub, MD; Ronney Asters, NP Subject: Stan Head Morning,  Dr.Gay has referred patient to [place] for a Left Nephrostomy Placement possible stent on 08/26/23 and he is on Xarelto.  Per protocol he will need to hold 2 full days prior to placement.  Will this be ok with you, thanks!  [name] [facility] IR Scheduler Lucien Mons [phone]

## 2023-08-12 NOTE — Telephone Encounter (Signed)
 Edd Fabian, FNP sent a staff message to preop team to put clearance in. I s/w [facility]ita Lung, IR scheduler

## 2023-08-25 ENCOUNTER — Telehealth: Payer: Self-pay | Admitting: *Deleted

## 2023-08-25 ENCOUNTER — Other Ambulatory Visit: Payer: Self-pay | Admitting: Radiology

## 2023-08-25 DIAGNOSIS — N1339 Other hydronephrosis: Secondary | ICD-10-CM

## 2023-08-25 MED ORDER — AMLODIPINE BESYLATE 5 MG PO TABS: 5.0000 mg | ORAL_TABLET | Freq: Every day | ORAL | 3 refills | Status: DC

## 2023-08-25 NOTE — Telephone Encounter (Signed)
 Rx refill sent to pharmacy.

## 2023-08-25 NOTE — H&P (Signed)
 Chief Complaint: Left distal ureteral stricture; referred for left percutaneous nephrostomy/possible nephroureteral catheter placement  Referring Provider(s): Gay,M  Supervising Physician: Oley Balm  Patient Status: [facility] - Out-pt  History of Present Illness: Joseph Pham is a 73 y.o. male with PMH sig for AAA, arthritis, hearing loss, CKD, DM, HTN, GERD, HLD, OSA, PE, prostate/skin cancer and urolithiasis. He underwent cystoscopy with left ureteroscopy with laser lithotripsy most recently in 05/2022 for the treatment of a distal left ureteral stone as well as a renal stone.  Apparently, postoperatively he had some left-sided flank pain and CT imaging a few months later revealed a distal ureteral stone.  Ultimately, his pain dissipated and was assumed to have passed the stone.  Recently, he was found to have an elevated creatinine which prompted imaging that revealed left hydronephrosis.  CT A/P revealed distal left ureteral stone with hydronephrosis and some atrophy of the left kidney. On 07/24/23 he underwent cysto/ureteroscopy which revealed: Cystoscopy demonstrated mildly obstructing prostate with no suspicious bladder lesions Left retrograde pyelogram with obstruction of the distal left ureter with no passage of contrast beyond 2 cm of the distal ureter. Left diagnostic ureteroscopy demonstrated a blind-ending ureter approximately 2 cm proximal to the left UVJ.  Unable to pass a wire into the proximal ureter beyond this level obstruction.  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   Overview:  43 mm ascending, 11/22/15   Bilateral  hearing loss 07/19/2019   Cataract    forming bilaterally    Chest pain    Chronic tonsillitis 07/19/2019   CKD (chronic kidney disease), 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   Complication of anesthesia    low heart rate during lithotripsy procedure   Deviated septum 07/19/2019   Diabetes (HCC) 03/16/2017   Essential hypertension 10/14/2015   Family history of adverse reaction to anesthesia    sister coded after surgery   GERD (gastroesophageal reflux disease)    Heart murmur    Hematuria    Hemorrhoid    History of kidney stones    Hyperlipidemia    Impingement of left ankle joint 03/06/2021   Kidney stone 03/16/2017   Lumbar radiculopathy 03/16/2017   Nasal turbinate hypertrophy 07/19/2019   OSA on CPAP    Paronychia 08/16/2015   PE (pulmonary thromboembolism) (HCC) 02/2019   Posterior tibialis tendinitis of both lower extremities 02/02/2020   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   CARPAL TUNNEL RELEASE Left 06/2016   CARPAL TUNNEL RELEASE Right 03/2017   CATARACT EXTRACTION, BILATERAL Bilateral 2005   COLONOSCOPY     last with medoff unsure when    CYSTOSCOPY WITH RETROGRADE PYELOGRAM, URETEROSCOPY AND  STENT PLACEMENT Left 04/09/2022   Procedure: CYSTOSCOPY WITH RETROGRADE PYELOGRAM AND STENT PLACEMENT;  Surgeon: Crista Elliot, MD;  Location: [facility];  Service: Urology;  Laterality: Left;   CYSTOSCOPY WITH RETROGRADE PYELOGRAM, URETEROSCOPY AND STENT PLACEMENT Left 05/04/2022   Procedure: CYSTOSCOPY WITH  RETROGRADE PYELOGRAM, URETEROSCOPY AND STENT PLACEMENT;  Surgeon: Rene Paci, MD;  Location: [facility];  Service: Urology;  Laterality: Left;   CYSTOSCOPY WITH URETEROSCOPY AND STENT PLACEMENT Right 10/28/2013   Procedure: RIGHT URETEROSCOPY AND STENT PLACEMENT;  Surgeon: Garnett Farm, MD;  Location: [facility];  Service: Urology;  Laterality: Right;   CYSTOSCOPY/URETEROSCOPY/HOLMIUM LASER/STENT PLACEMENT Left 09/06/2020   Procedure: CYSTOSCOPY/POSSIBLE URETEROSCOPY/HOLMIUM LASER/STENT PLACEMENT;  Surgeon: Belva Agee, MD;  Location: [facility];  Service: Urology;  Laterality: Left;  30 MINS   CYSTOSCOPY/URETEROSCOPY/HOLMIUM LASER/STENT PLACEMENT Left 04/22/2022   Procedure: CYSTOSCOPY/URETEROSCOPY/HOLMIUM LASER/STENT REPLACEMENT;  Surgeon: Belva Agee, MD;  Location: [facility];  Service: Urology;  Laterality: Left;  1 HR   CYSTOSCOPY/URETEROSCOPY/HOLMIUM LASER/STENT PLACEMENT Left 05/20/2022   Procedure: CYSTOSCOPY/URETEROSCOPY/HOLMIUM LASER/STENT EXCHANGE;  Surgeon: Belva Agee, MD;  Location: [facility];  Service: Urology;  Laterality: Left;   CYSTOSCOPY/URETEROSCOPY/HOLMIUM LASER/STENT PLACEMENT Left 07/24/2023   Procedure: DIAGNOSTIC URETEROSCOPY;  Surgeon: Jannifer Hick, MD;  Location: [facility];  Service: Urology;  Laterality: Left;  60 MINUTES NEEDED FOR CASE   EXTRACORPOREAL SHOCK WAVE LITHOTRIPSY  02/08/2009   HOLMIUM LASER APPLICATION Right 10/28/2013   Procedure: HOLMIUM LASER LITHO;  Surgeon: Garnett Farm, MD;  Location: [facility];  Service: Urology;  Laterality: Right;   LEFT KNEE SURG.  1993  &  NOV 2011   LEFT URETEROSCOPIC STONE EXTRACTION  02/23/2009   POLYPECTOMY  2014   1 polyp per dr Urban Gibson notes- no surg path to determine polyps type but was a 5 yr recall    RADIOACTIVE SEED IMPLANT  08/08/2011   Procedure: RADIOACTIVE SEED IMPLANT;  Surgeon: Garnett Farm, MD;  Location: [facility];  Service: Urology;   Laterality: N/A;  seeds implanted 50 seeds found in bladder=none    REPLACEMENT TOTAL KNEE Left 2018   SKIN CANCER EXCISION     Burned off left side of nose, and head   TOTAL KNEE ARTHROPLASTY Right 08/2018   UPPER GASTROINTESTINAL ENDOSCOPY     URETEROSCOPY  03/15/2012   Procedure: URETEROSCOPY;  Surgeon: Garnett Farm, MD;  Location: [facility];  Service: Urology;  Laterality: Right;  Right Ueteroscopy     Allergies: Aspirin, Doxycycline, Amoxicillin, and Chlorhexidine gluconate  Medications: Prior to Admission medications   Medication Sig Start Date End Date Taking? Authorizing Provider  amLODipine (NORVASC) 5 MG tablet Take 1 tablet (5 mg total) by mouth daily. 08/25/23 11/23/23  Baldo Daub, MD  atorvastatin (LIPITOR) 10 MG tablet Take 10 mg by mouth at bedtime.    [provider]  fexofenadine (ALLEGRA) 180 MG tablet Take 180 mg by mouth in the morning.    [provider]  fluticasone (FLONASE) 50 MCG/ACT nasal spray Place 2 sprays into the nose daily as needed for allergies.     [provider]  hydrocortisone 2.5 % cream Apply 1 Application topically daily as needed (ear irritation).    [provider]  JARDIANCE 10 MG TABS tablet Take 10 mg by mouth every morning.    [provider]  Melatonin 5 MG CAPS Take 5 mg by mouth at bedtime.    [provider]  omega-3 acid ethyl esters (LOVAZA) 1 g capsule Take 4 g by mouth at bedtime. 05/02/20   [provider]  omeprazole (PRILOSEC) 20 MG capsule Take 20 mg by mouth in the morning and at bedtime.    [provider]  oxyCODONE-acetaminophen (PERCOCET) 5-325 MG tablet Take 1 tablet by mouth every 4 (four) hours as needed for up to 18 doses for severe pain (pain score 7-10). 07/24/23   Jannifer Hick, MD  Polyethyl Glycol-Propyl Glycol (SYSTANE) 0.4-0.3 % SOLN Place 1-2 drops into both eyes 3 (three) times daily as needed (dry/irritated eyes.).     [provider]  potassium citrate (UROCIT-K) 10 MEQ (1080 MG) SR tablet Take 10 mEq by mouth in the morning. 01/26/23   [provider]  rivaroxaban (XARELTO) 10 MG TABS tablet Take 10 mg by mouth in the morning.    [provider]  traMADol (ULTRAM) 50 MG tablet Take 50 mg by mouth in the morning and at bedtime.    [provider]  traZODone (DESYREL) 100 MG tablet Take 100 mg by mouth at bedtime.    [provider]  vitamin B-12 (CYANOCOBALAMIN) 500 MCG tablet Take 500 mcg by mouth in the morning.    [provider]  VITAMIN D PO Take 5,000 Units by mouth in the morning.    [provider]     Family History  Problem Relation Age of Onset   Cancer Mother    Diabetes Father    CAD Father    Heart attack Father    Diabetes Sister    Colon polyps Sister    Colon polyps Brother    Colon cancer Neg Hx    Esophageal cancer Neg Hx    Rectal cancer Neg Hx    Stomach cancer Neg Hx     Social History   Socioeconomic History   Marital status: Married    Spouse name: Not on file   Number of children: 2   Years of education: Not on file   Highest education level: Not on file  Occupational History   Not on file  Tobacco Use   Smoking status: Former    Current packs/day: 0.00    Types: Cigarettes    Start date: 07/03/1983    Quit date: 07/02/1993    Years since quitting: 30.1   Smokeless tobacco: Former    Types: Associate Professor status: Never Used  Substance and Sexual Activity   Alcohol use: No   Drug use: No   Sexual activity: Not on file  Other Topics Concern   Not on file  Social History Narrative   Not on file   Social Drivers of Health   Financial Resource Strain: Not on file  Food Insecurity: No Food Insecurity (05/04/2022)   Hunger Vital Sign    Worried About Running Out of Food in the Last Year: Never true    Ran Out of Food in the Last Year: Never true  Transportation Needs: No  Transportation Needs (05/04/2022)   PRAPARE - Administrator, Civil Service (Medical): No    Lack of Transportation (Non-Medical): No  Physical Activity: Not on file  Stress: Not on file  Social Connections: Not on file       Review of Systems denies fever,HA,CP,dyspnea, cough, abd pain, N/V or bleeding; he does have some left flank discomfort  Vital Signs: Vitals:   08/26/23 0740  BP: 131/73  Pulse:  61  Resp: 19  Temp: 98.1 F (36.7 C)  SpO2: 97%      Advance Care Plan: no documents on file  Physical Exam: awake/alert; chest- CTA bilat; heart- RRR; abd-obese, soft,+BS,NT; 1-2 + pretibial edema bilat  Imaging: No results found.  Labs:  CBC: Recent Labs    07/15/23 0933  WBC 6.2  HGB 13.1  HCT 42.1  PLT 216    COAGS: No results for input(s): "INR", "APTT" in the last 8760 hours.  BMP: Recent Labs    05/19/23 1217 07/15/23 0933  NA 137 136  K 5.4* 4.7  CL 103 101  CO2 25 26  GLUCOSE 104* 126*  BUN 29* 32*  CALCIUM 9.4 9.3  CREATININE 2.77* 2.38*  GFRNONAA 24* 28*    LIVER FUNCTION TESTS: No results for input(s): "BILITOT", "AST", "ALT", "ALKPHOS", "PROT", "ALBUMIN" in the last 8760 hours.  TUMOR MARKERS: No results for input(s): "AFPTM", "CEA", "CA199", "CHROMGRNA" in the last 8760 hours.  Assessment and Plan: 73 y.o. male with PMH sig for AAA, arthritis, hearing loss, CKD, DM, HTN, GERD, HLD, OSA, PE, prostate/skin cancer and urolithiasis. He underwent cystoscopy with left ureteroscopy with laser lithotripsy most recently in 05/2022 for the treatment of a distal left ureteral stone as well as a renal stone.  Apparently, postoperatively he had some left-sided flank pain and CT imaging a few months later revealed a distal ureteral stone.  Ultimately, his pain dissipated and was assumed to have passed the stone.  Recently, he was found to have an elevated creatinine which prompted imaging that revealed left hydronephrosis.  CT A/P revealed  distal left ureteral stone with hydronephrosis and some atrophy of the left kidney. On 07/24/23 he underwent cysto/ureteroscopy which revealed: Cystoscopy demonstrated mildly obstructing prostate with no suspicious bladder lesions Left retrograde pyelogram with obstruction of the distal left ureter with no passage of contrast beyond 2 cm of the distal ureter. Left diagnostic ureteroscopy demonstrated a blind-ending ureter approximately 2 cm proximal to the left UVJ.  Unable to pass a wire into the proximal ureter beyond this level obstruction.  He presents today for left percutaneous nephrostomy/possible nephroureteral catheter placement. Risks and benefits of left PCN placement was discussed with the patient/spouse  including, but not limited to, infection, bleeding, significant bleeding causing loss or decrease in renal function or damage to adjacent structures.   All of the patient's questions were answered, patient is agreeable to proceed.  Consent signed and in chart.      Thank you for allowing our service to participate in Joseph Pham 's care.  Electronically Signed: D. Jeananne Rama, PA-C   08/25/2023, 4:18 PM      I spent a total of  30 Minutes   in face to face in clinical consultation, greater than 50% of which was counseling/coordinating care for left percutaneous nephrostomy/nephroureteral catheter placement

## 2023-08-26 ENCOUNTER — Other Ambulatory Visit (HOSPITAL_COMMUNITY): Payer: Self-pay | Admitting: Interventional Radiology

## 2023-08-26 ENCOUNTER — Ambulatory Visit (HOSPITAL_COMMUNITY)
Admission: RE | Admit: 2023-08-26 | Discharge: 2023-08-26 | Disposition: A | Discharge status: Home / Self Care | Source: Ambulatory Visit | Attending: Urology | Admitting: Urology

## 2023-08-26 ENCOUNTER — Encounter (HOSPITAL_COMMUNITY): Payer: Self-pay

## 2023-08-26 ENCOUNTER — Other Ambulatory Visit: Payer: Self-pay

## 2023-08-26 DIAGNOSIS — N1339 Other hydronephrosis: Secondary | ICD-10-CM | POA: Insufficient documentation

## 2023-08-26 DIAGNOSIS — N135 Crossing vessel and stricture of ureter without hydronephrosis: Secondary | ICD-10-CM | POA: Diagnosis not present

## 2023-08-26 DIAGNOSIS — Z7901 Long term (current) use of anticoagulants: Secondary | ICD-10-CM | POA: Insufficient documentation

## 2023-08-26 DIAGNOSIS — Z87891 Personal history of nicotine dependence: Secondary | ICD-10-CM | POA: Diagnosis not present

## 2023-08-26 DIAGNOSIS — Z7984 Long term (current) use of oral hypoglycemic drugs: Secondary | ICD-10-CM | POA: Diagnosis not present

## 2023-08-26 LAB — CBC WITH DIFFERENTIAL/PLATELET
Abs Immature Granulocytes: 0.02 10*3/uL (ref 0.00–0.07)
Basophils Absolute: 0 10*3/uL (ref 0.0–0.1)
Basophils Relative: 1 %
Eosinophils Absolute: 0.1 10*3/uL (ref 0.0–0.5)
Eosinophils Relative: 2 %
HCT: 39.9 % (ref 39.0–52.0)
Hemoglobin: 12.7 g/dL — ABNORMAL LOW (ref 13.0–17.0)
Immature Granulocytes: 0 %
Lymphocytes Relative: 27 %
Lymphs Abs: 1.6 10*3/uL (ref 0.7–4.0)
MCH: 27.4 pg (ref 26.0–34.0)
MCHC: 31.8 g/dL (ref 30.0–36.0)
MCV: 86.2 fL (ref 80.0–100.0)
Monocytes Absolute: 0.7 10*3/uL (ref 0.1–1.0)
Monocytes Relative: 13 %
Neutro Abs: 3.4 10*3/uL (ref 1.7–7.7)
Neutrophils Relative %: 57 %
Platelets: 190 10*3/uL (ref 150–400)
RBC: 4.63 MIL/uL (ref 4.22–5.81)
RDW: 14.8 % (ref 11.5–15.5)
WBC: 5.8 10*3/uL (ref 4.0–10.5)
nRBC: 0 % (ref 0.0–0.2)

## 2023-08-26 LAB — BASIC METABOLIC PANEL
Anion gap: 11 (ref 5–15)
BUN: 27 mg/dL — ABNORMAL HIGH (ref 8–23)
CO2: 25 mmol/L (ref 22–32)
Calcium: 9.1 mg/dL (ref 8.9–10.3)
Chloride: 100 mmol/L (ref 98–111)
Creatinine, Ser: 2.41 mg/dL — ABNORMAL HIGH (ref 0.61–1.24)
GFR, Estimated: 28 mL/min — ABNORMAL LOW (ref >60)
Glucose, Bld: 120 mg/dL — ABNORMAL HIGH (ref 70–99)
Potassium: 3.8 mmol/L (ref 3.5–5.1)
Sodium: 136 mmol/L (ref 135–145)

## 2023-08-26 LAB — GLUCOSE, CAPILLARY: Glucose-Capillary: 129 mg/dL — ABNORMAL HIGH (ref 70–99)

## 2023-08-26 LAB — PROTIME-INR
INR: 1 (ref 0.8–1.2)
Prothrombin Time: 13.7 s (ref 11.4–15.2)

## 2023-08-26 MED ORDER — MIDAZOLAM HCL 2 MG/2ML IJ SOLN
INTRAMUSCULAR | Status: AC
  Filled 2023-08-26: qty 4

## 2023-08-26 MED ORDER — SODIUM CHLORIDE 0.9% FLUSH: 5.0000 mL | Freq: Three times a day (TID) | INTRAVENOUS | Status: DC

## 2023-08-26 MED ORDER — MIDAZOLAM HCL 2 MG/2ML IJ SOLN
INTRAMUSCULAR | Status: AC | PRN
  Administered 2023-08-26: 1 mg via INTRAVENOUS

## 2023-08-26 MED ORDER — SODIUM CHLORIDE 0.9 % IV SOLN: INTRAVENOUS | Status: DC

## 2023-08-26 MED ORDER — FENTANYL CITRATE (PF) 100 MCG/2ML IJ SOLN
INTRAMUSCULAR | Status: AC
  Filled 2023-08-26: qty 2

## 2023-08-26 MED ORDER — FENTANYL CITRATE (PF) 100 MCG/2ML IJ SOLN
INTRAMUSCULAR | Status: AC | PRN
Start: 2023-08-26 — End: 2023-08-26
  Administered 2023-08-26: 50 ug via INTRAVENOUS

## 2023-08-26 MED ORDER — LIDOCAINE HCL 1 % IJ SOLN: 20.0000 mL | Freq: Once | INTRAMUSCULAR | Status: DC

## 2023-08-26 MED ORDER — IOHEXOL 300 MG/ML  SOLN
50.0000 mL | Freq: Once | INTRAMUSCULAR | Status: AC | PRN
  Administered 2023-08-26: 20 mL

## 2023-08-26 MED ORDER — LEVOFLOXACIN IN D5W 500 MG/100ML IV SOLN
500.0000 mg | Freq: Once | INTRAVENOUS | Status: AC
  Administered 2023-08-26: 500 mg via INTRAVENOUS
  Filled 2023-08-26: qty 100

## 2023-08-26 MED ORDER — LIDOCAINE HCL 1 % IJ SOLN
INTRAMUSCULAR | Status: AC
  Filled 2023-08-26: qty 20

## 2023-08-26 MED ORDER — LIDOCAINE HCL 1 % IJ SOLN
20.0000 mL | Freq: Once | INTRAMUSCULAR | Status: AC
  Administered 2023-08-26: 10 mL via INTRADERMAL

## 2023-08-26 MED ORDER — IOHEXOL 300 MG/ML  SOLN: 50.0000 mL | Freq: Once | INTRAMUSCULAR | Status: DC | PRN

## 2023-08-26 NOTE — Progress Notes (Signed)
 1140 Nephrostomy tube with blood with pus no change since procedure, approximately 75 cc's at discharge.

## 2023-08-26 NOTE — Procedures (Signed)
 Interventional Radiology Procedure Note  Procedure: Left percutaneous nephrostomy  Complications: None  Estimated Blood Loss: < 10 mL  Findings: Severe left hydronephrosis by [place]. Gross pus in left renal collecting system. 10 Fr PCN placed via LP access and formed in renal pelvis. Draining purulent urine after placement. Urine sample sent for culture.  Plan: After appropriate treatment for urine infection, will schedule elective attempt at antegrade ureteral stent placement in 2-3 weeks.  Jodi Marble. Fredia Sorrow, M.D Pager:  [phone]

## 2023-08-26 NOTE — Discharge Instructions (Signed)
Discharge Instructions:   Please call Interventional Radiology clinic [phone] with any questions or concerns.  You may remove your dressing and shower tomorrow.      Moderate Conscious Sedation, Adult, Care After This sheet gives you information about how to care for yourself after your procedure. Your health care provider may also give you more specific instructions. If you have problems or questions, contact your health care provider. What can I expect after the procedure? After the procedure, it is common to have: Sleepiness for several hours. Impaired judgment for several hours. Difficulty with balance. Vomiting if you eat too soon. Follow these instructions at home: For the time period you were told by your health care provider:  Rest. Do not participate in activities where you could fall or become injured. Do not drive or use machinery. Do not drink alcohol. Do not take sleeping pills or medicines that cause drowsiness. Do not make important decisions or sign legal documents. Do not take care of children on your own. Eating and drinking  Follow the diet recommended by your health care provider. Drink enough fluid to keep your urine pale yellow. If you vomit: Drink water, juice, or soup when you can drink without vomiting. Make sure you have little or no nausea before eating solid foods. General instructions Take over-the-counter and prescription medicines only as told by your health care provider. Have a responsible adult stay with you for the time you are told. It is important to have someone help care for you until you are awake and alert. Do not smoke. Keep all follow-up visits as told by your health care provider. This is important. Contact a health care provider if: You are still sleepy or having trouble with balance after 24 hours. You feel light-headed. You keep feeling nauseous or you keep vomiting. You develop a rash. You have a fever. You have redness  or swelling around the IV site. Get help right away if: You have trouble breathing. You have new-onset confusion at home. Summary After the procedure, it is common to feel sleepy, have impaired judgment, or feel nauseous if you eat too soon. Rest after you get home. Know the things you should not do after the procedure. Follow the diet recommended by your health care provider and drink enough fluid to keep your urine pale yellow. Get help right away if you have trouble breathing or new-onset confusion at home. This information is not intended to replace advice given to you by your health care provider. Make sure you discuss any questions you have with your health care provider. Document Revised: 09/16/2019 Document Reviewed: 04/14/2019 Elsevier Patient Education  2023 [employer]  

## 2023-08-28 LAB — URINE CULTURE
Culture: 30000 — AB
Special Requests: NORMAL

## 2023-08-31 ENCOUNTER — Other Ambulatory Visit: Payer: Self-pay | Admitting: Radiology

## 2023-08-31 DIAGNOSIS — R319 Hematuria, unspecified: Secondary | ICD-10-CM

## 2023-09-01 ENCOUNTER — Ambulatory Visit (HOSPITAL_COMMUNITY)
Admission: RE | Admit: 2023-09-01 | Discharge: 2023-09-01 | Disposition: A | Discharge status: Home / Self Care | Source: Ambulatory Visit | Attending: Radiology | Admitting: Radiology

## 2023-09-01 ENCOUNTER — Encounter: Payer: Self-pay | Admitting: Radiology

## 2023-09-01 DIAGNOSIS — R319 Hematuria, unspecified: Secondary | ICD-10-CM

## 2023-09-01 DIAGNOSIS — N135 Crossing vessel and stricture of ureter without hydronephrosis: Secondary | ICD-10-CM | POA: Diagnosis not present

## 2023-09-01 DIAGNOSIS — Z436 Encounter for attention to other artificial openings of urinary tract: Secondary | ICD-10-CM | POA: Diagnosis not present

## 2023-09-01 MED ORDER — IOHEXOL 300 MG/ML  SOLN
50.0000 mL | Freq: Once | INTRAMUSCULAR | Status: AC | PRN
  Administered 2023-09-01: 10 mL

## 2023-09-01 NOTE — Progress Notes (Signed)
 Patient presented to IR today with c/o hematuria in his left nephrostomy bag.  Exam revealed serous, blood tinged drainage in left nephrostomy bag. Insertion site does not have evidence of surrounding erythema or edema. No drainage from insertion site.  Consent obtained and drain study performed/ reviewed by Dr. Donne Hazel.  Patient advised by Dr. Milford Cage to hold xarelto for two days. Patient will have a follow up phone call by our staff on Thursday to see if hematuria has improved.

## 2023-09-03 ENCOUNTER — Telehealth (HOSPITAL_COMMUNITY): Payer: Self-pay | Admitting: Radiology

## 2023-09-03 NOTE — Telephone Encounter (Signed)
 Provided call to patient to follow up after his visit the other day.  Patient reports that hematuria has improved, however his urine still has blood tinge to it.  Patient overall denies dizziness, weakness, fatigue, syncope, fever, surrounding erythema/edema at the drain insertion site.  Patient advised to start xarelto as discussed (tomorrow). Patient informed that minor blood tinge to the urine can be expected. However, he should contact our team if the output becomes bright red or if he experiences the symptoms above.  Patient inquired about holding xarelto prior to his procedure next week. After conversation with Dr. Milford Cage, patient advised to hold xarelto for 48 hour prior to his procedure.  Patient expressed understanding and was advised to contact us with further questions or concerns.

## 2023-09-07 ENCOUNTER — Other Ambulatory Visit: Payer: Self-pay | Admitting: Radiology

## 2023-09-07 DIAGNOSIS — N1339 Other hydronephrosis: Secondary | ICD-10-CM

## 2023-09-07 NOTE — H&P (Signed)
 Chief Complaint: Left distal ureteral stricture; status post left percutaneous nephrostomy 08/26/23; referred  today for left ureteral stent placement   Referring Provider(s): Gay,M  Supervising Physician: Mugweru,J  Patient Status: [facility] - Out-pt  History of Present Illness: Joseph Pham is a 73 y.o. male  with PMH sig for AAA, arthritis, hearing loss, CKD, DM, HTN, GERD, HLD, OSA, PE, prostate/skin cancer and urolithiasis. He underwent cystoscopy with left ureteroscopy with laser lithotripsy most recently in 05/2022 for the treatment of a distal left ureteral stone as well as a renal stone.  Apparently, postoperatively he had some left-sided flank pain and CT imaging a few months later revealed a distal ureteral stone.  Ultimately, his pain dissipated and was assumed to have passed the stone.  Recently, he was found to have an elevated creatinine which prompted imaging that revealed left hydronephrosis.  CT A/P revealed distal left ureteral stone with hydronephrosis and some atrophy of the left kidney. On 07/24/23 he underwent cysto/ureteroscopy which revealed: Cystoscopy demonstrated mildly obstructing prostate with no suspicious bladder lesions Left retrograde pyelogram with obstruction of the distal left ureter with no passage of contrast beyond 2 cm of the distal ureter. Left diagnostic ureteroscopy demonstrated a blind-ending ureter approximately 2 cm proximal to the left UVJ.  Unable to pass a wire into the proximal ureter beyond this level obstruction.  He is status post left percutaneous nephrostomy on 08/26/2023; recent left nephrostogram on 4/1 showed at least partial obstruction at the left UPJ.  He presents today for left ureteral stent placement.  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   Overview:  43 mm ascending, 11/22/15   Bilateral hearing loss 07/19/2019   Cataract    forming bilaterally    Chest pain    Chronic tonsillitis 07/19/2019   CKD (chronic kidney disease), 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   Complication of anesthesia    low heart rate during lithotripsy procedure   Deviated septum 07/19/2019   Diabetes (HCC) 03/16/2017   Essential hypertension 10/14/2015   Family history of adverse reaction to anesthesia    sister coded after surgery   GERD (gastroesophageal reflux disease)    Heart murmur    Hematuria    Hemorrhoid    History of kidney stones    Hyperlipidemia    Impingement of left ankle joint 03/06/2021   Kidney stone 03/16/2017   Lumbar radiculopathy 03/16/2017   Nasal turbinate hypertrophy 07/19/2019   OSA on CPAP    Paronychia 08/16/2015   PE (pulmonary thromboembolism) (HCC) 02/2019   Posterior tibialis tendinitis of both lower extremities 02/02/2020   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   CARPAL TUNNEL RELEASE Left 06/2016   CARPAL TUNNEL RELEASE  Right 03/2017   CATARACT EXTRACTION, BILATERAL Bilateral 2005   COLONOSCOPY     last with medoff unsure when    CYSTOSCOPY WITH RETROGRADE PYELOGRAM, URETEROSCOPY AND STENT PLACEMENT Left 04/09/2022   Procedure: CYSTOSCOPY WITH RETROGRADE PYELOGRAM AND STENT PLACEMENT;  Surgeon: Crista Elliot, MD;  Location: [facility];  Service: Urology;  Laterality: Left;    CYSTOSCOPY WITH RETROGRADE PYELOGRAM, URETEROSCOPY AND STENT PLACEMENT Left 05/04/2022   Procedure: CYSTOSCOPY WITH RETROGRADE PYELOGRAM, URETEROSCOPY AND STENT PLACEMENT;  Surgeon: Rene Paci, MD;  Location: [facility];  Service: Urology;  Laterality: Left;   CYSTOSCOPY WITH URETEROSCOPY AND STENT PLACEMENT Right 10/28/2013   Procedure: RIGHT URETEROSCOPY AND STENT PLACEMENT;  Surgeon: Garnett Farm, MD;  Location: [facility];  Service: Urology;  Laterality: Right;   CYSTOSCOPY/URETEROSCOPY/HOLMIUM LASER/STENT PLACEMENT Left 09/06/2020   Procedure: CYSTOSCOPY/POSSIBLE URETEROSCOPY/HOLMIUM LASER/STENT PLACEMENT;  Surgeon: Belva Agee, MD;  Location: [facility];  Service: Urology;  Laterality: Left;  30 MINS   CYSTOSCOPY/URETEROSCOPY/HOLMIUM LASER/STENT PLACEMENT Left 04/22/2022   Procedure: CYSTOSCOPY/URETEROSCOPY/HOLMIUM LASER/STENT REPLACEMENT;  Surgeon: Belva Agee, MD;  Location: [facility];  Service: Urology;  Laterality: Left;  1 HR   CYSTOSCOPY/URETEROSCOPY/HOLMIUM LASER/STENT PLACEMENT Left 05/20/2022   Procedure: CYSTOSCOPY/URETEROSCOPY/HOLMIUM LASER/STENT EXCHANGE;  Surgeon: Belva Agee, MD;  Location: [facility];  Service: Urology;  Laterality: Left;   CYSTOSCOPY/URETEROSCOPY/HOLMIUM LASER/STENT PLACEMENT Left 07/24/2023   Procedure: DIAGNOSTIC URETEROSCOPY;  Surgeon: Jannifer Hick, MD;  Location: [facility];  Service: Urology;  Laterality: Left;  60 MINUTES NEEDED FOR CASE   EXTRACORPOREAL SHOCK WAVE LITHOTRIPSY  02/08/2009   HOLMIUM LASER APPLICATION Right 10/28/2013   Procedure: HOLMIUM LASER LITHO;  Surgeon: Garnett Farm, MD;  [facility];  Service: Urology;  Laterality: Right;   IR NEPHROSTOGRAM LEFT THRU EXISTING ACCESS  09/01/2023   IR NEPHROSTOMY PLACEMENT LEFT  08/26/2023   LEFT KNEE SURG.  1993  &  NOV 2011   LEFT URETEROSCOPIC STONE EXTRACTION  02/23/2009   POLYPECTOMY  2014   1 polyp per dr Urban Gibson notes- no surg path to  determine polyps type but was a 5 yr recall    RADIOACTIVE SEED IMPLANT  08/08/2011   Procedure: RADIOACTIVE SEED IMPLANT;  Surgeon: Garnett Farm, MD;  Location: [facility];  Service: Urology;  Laterality: N/A;  seeds implanted 50 seeds found in bladder=none    REPLACEMENT TOTAL KNEE Left 2018   SKIN CANCER EXCISION     Burned off left side of nose, and head   TOTAL KNEE ARTHROPLASTY Right 08/2018   UPPER GASTROINTESTINAL ENDOSCOPY     URETEROSCOPY  03/15/2012   Procedure: URETEROSCOPY;  Surgeon: Garnett Farm, MD;  Location: [facility];  Service: Urology;  Laterality: Right;  Right Ueteroscopy     Allergies: Aspirin, Doxycycline, Amoxicillin, and Chlorhexidine gluconate  Medications: Prior to Admission medications   Medication Sig Start Date End Date Taking? Authorizing Provider  amLODipine (NORVASC) 5 MG tablet Take 1 tablet (5 mg total) by mouth daily. 08/25/23 11/23/23  Baldo Daub, MD  atorvastatin (LIPITOR) 10 MG tablet Take 10 mg by mouth at bedtime.    [provider]  fexofenadine (ALLEGRA) 180 MG tablet Take 180 mg by mouth in the morning.    [provider]  fluticasone (FLONASE) 50 MCG/ACT nasal spray Place 2 sprays into the nose daily as needed for allergies.     [provider]  hydrocortisone 2.5 % cream Apply 1 Application  topically daily as needed (ear irritation).    [provider]  JARDIANCE 10 MG TABS tablet Take 10 mg by mouth every morning.    [provider]  Melatonin 5 MG CAPS Take 5 mg by mouth at bedtime.    [provider]  omega-3 acid ethyl esters (LOVAZA) 1 g capsule Take 4 g by mouth at bedtime. 05/02/20   [provider]  omeprazole (PRILOSEC) 20 MG capsule Take 20 mg by mouth in the morning and at bedtime.    [provider]  oxyCODONE-acetaminophen (PERCOCET) 5-325 MG tablet Take 1 tablet by mouth every 4 (four) hours as needed for up to 18 doses  for severe pain (pain score 7-10). 07/24/23   Jannifer Hick, MD  Polyethyl Glycol-Propyl Glycol (SYSTANE) 0.4-0.3 % SOLN Place 1-2 drops into both eyes 3 (three) times daily as needed (dry/irritated eyes.).    [provider]  potassium citrate (UROCIT-K) 10 MEQ (1080 MG) SR tablet Take 10 mEq by mouth in the morning. 01/26/23   [provider]  rivaroxaban (XARELTO) 10 MG TABS tablet Take 10 mg by mouth in the morning.    [provider]  traMADol (ULTRAM) 50 MG tablet Take 50 mg by mouth in the morning and at bedtime.    [provider]  traZODone (DESYREL) 100 MG tablet Take 100 mg by mouth at bedtime.    [provider]  vitamin B-12 (CYANOCOBALAMIN) 500 MCG tablet Take 500 mcg by mouth in the morning.    [provider]  VITAMIN D PO Take 5,000 Units by mouth in the morning.    [provider]     Family History  Problem Relation Age of Onset   Cancer Mother    Diabetes Father    CAD Father    Heart attack Father    Diabetes Sister    Colon polyps Sister    Colon polyps Brother    Colon cancer Neg Hx    Esophageal cancer Neg Hx    Rectal cancer Neg Hx    Stomach cancer Neg Hx     Social History   Socioeconomic History   Marital status: Married    Spouse name: Not on file   Number of children: 2   Years of education: Not on file   Highest education level: Not on file  Occupational History   Not on file  Tobacco Use   Smoking status: Former    Current packs/day: 0.00    Types: Cigarettes    Start date: 07/03/1983    Quit date: 07/02/1993    Years since quitting: 30.2   Smokeless tobacco: Former    Types: Engineer, drilling   Vaping status: Never Used  Substance and Sexual Activity   Alcohol use: No   Drug use: No   Sexual activity: Not on file  Other Topics Concern   Not on file  Social History Narrative   Not on file   Social Drivers of Health   Financial Resource Strain: Not on file  Food  Insecurity: No Food Insecurity (05/04/2022)   Hunger Vital Sign    Worried About Running Out of Food in the Last Year: Never true    Ran Out of Food in the Last Year: Never true  Transportation Needs: No Transportation Needs (05/04/2022)   PRAPARE - Administrator, Civil Service (Medical): No    Lack of Transportation (Non-Medical): No  Physical Activity: Not on file  Stress: Not on file  Social Connections: Not on file       Review of Systems  Vital Signs:   Advance Care Plan: No documents on file   Physical Exam  Imaging: IR NEPHROSTOGRAM LEFT THRU EXISTING ACCESS Result Date: 09/01/2023 INDICATION: Assess left nephrostomy tube; bloody urine EXAM: LEFT PERCUTANEOUS NEPHROSTOMY TUBE EVALUATION and ANTEGRADE NEPHROSTOGRAM COMPARISON:  IR fluoroscopy, 08/26/2023.  CT AP, 06/08/2023. CONTRAST:  10 mL Isovue-300 administered into the collecting system FLUOROSCOPY TIME:  Fluoroscopic dose; 1 mGy COMPLICATIONS: None immediate. TECHNIQUE: Informed written consent was obtained from the patient after a discussion of the risks, benefits and alternatives to treatment. Questions regarding the procedure were encouraged and answered. A timeout was performed prior to the initiation of the procedure. The LEFT flank and external portion of existing nephrostomy catheter were prepped and draped in the usual sterile fashion. A sterile drape was applied covering the operative field. Maximum barrier sterile technique with sterile gowns and gloves were used for the procedure. A timeout was performed prior to the initiation of the procedure. A pre procedural spot fluoroscopic image was obtained after contrast was injected via the existing nephrostomy catheter demonstrating appropriate positioning within the renal pelvis. Adequate opacification of the LEFT ureter, with transition at the ureteropelvic junction. The catheter was flushed. No exchange was performed. A dressing was placed. The patient  tolerated the procedure well without immediate postprocedural complication. FINDINGS: *The existing nephrostomy catheter is appropriately positioned and functioning. *Adequate opacification of the LEFT renal pelvis and ureter, with transition at the UPJ. No hydronephrosis. IMPRESSION: 1. Successful fluoroscopic guided LEFT nephrostomy tube evaluation. No exchange was performed. 2. LEFT antegrade nephrostogram noting (at least partial) obstruction at the LEFT UPJ. No hydronephrosis. RECOMMENDATIONS: For the patient's hematuria, he was recommended to hold [facility] for 2 days. The patient will return to Vascular Interventional Radiology (VIR) for drainage catheter evaluation and internalization on 09/08/2023. Roanna Banning, MD [facility] Rad[facility] Electronically Signed   By: Roanna Banning M.D.   On: 09/01/2023 16:07   IR NEPHROSTOMY PLACEMENT LEFT Result Date: 08/26/2023 CLINICAL DATA:  Obstructed left kidney secondary to distal left ureteral stricture after ureteral stone removal which could not be crossed from a retrograde cystoscopic approach. The patient presents for percutaneous nephrostomy tube placement. EXAM: 1. ULTRASOUND GUIDANCE FOR PUNCTURE OF THE LEFT RENAL COLLECTING SYSTEM. 2. LEFT PERCUTANEOUS NEPHROSTOMY TUBE PLACEMENT. COMPARISON:  None Available. ANESTHESIA/SEDATION: Moderate (conscious) sedation was employed during this procedure. A total of Versed 2.0 mg and Fentanyl 50 mcg was administered intravenously. Moderate Sedation Time: 22 minutes. The patient's level of consciousness and vital signs were monitored continuously by radiology nursing throughout the procedure under my direct supervision. CONTRAST:  20 mL Omnipaque 300 MEDICATIONS: 500 mg IV Levaquin. Antibiotic was administered in an appropriate time frame prior to skin puncture. FLUOROSCOPY TIME:  1 minute and 24 seconds.  30.0 mGy. PROCEDURE: The procedure, risks, benefits, and alternatives were  explained to the patient. Questions regarding the procedure were encouraged and answered. The patient understands and consents to the procedure. A time-out was performed prior to initiating the procedure. The left flank region was prepped with Betadine in a sterile fashion, and a sterile drape was applied covering the operative field. A sterile gown and sterile gloves were used for the procedure. Local anesthesia was provided with 1% Lidocaine. Ultrasound was used to localize the left kidney. Under direct ultrasound guidance, a 21 gauge needle was advanced into the renal collecting system. Ultrasound  image documentation was performed. Aspiration of urine sample was performed followed by contrast injection. A transitional dilator was advanced over a guidewire. Percutaneous tract dilatation was then performed over the guidewire. A 10-French percutaneous nephrostomy tube was then advanced and formed in the collecting system. Catheter position was confirmed by fluoroscopy after contrast injection. The catheter was secured at the skin with a Prolene retention suture and Stat-Lock device. A gravity bag was placed. COMPLICATIONS: None. FINDINGS: After puncture of the left renal collecting system, there was return of grossly purulent urine consistent with pyonephrosis. A 10 French nephrostomy tube was placed and formed at the level of the renal pelvis with rapid return of purulent fluid. A purulent fluid sample was sent for culture analysis. The nephrostomy tube will be left to gravity bag drainage while urinary infection is treated adequately. IMPRESSION: 1. Left percutaneous nephrostomy tube placement with placement of a 10 French nephrostomy tube formed at the level of the renal pelvis. There is evidence of gross pyonephrosis today after access of the collecting system and a sample of purulent urine was sent for culture analysis. 2. The nephrostomy tube will be left to gravity drainage as the urinary infection is  appropriately treated. The patient will be scheduled for elective antegrade attempt at ureteral stent placement in approximately 2-3 weeks. Electronically Signed   By: Irish Lack M.D.   On: 08/26/2023 10:20    Labs:  CBC: Recent Labs    07/15/23 0933 08/26/23 0800  WBC 6.2 5.8  HGB 13.1 12.7*  HCT 42.1 39.9  PLT 216 190    COAGS: Recent Labs    08/26/23 0800  INR 1.0    BMP: Recent Labs    05/19/23 1217 07/15/23 0933 08/26/23 0800  NA 137 136 136  K 5.4* 4.7 3.8  CL 103 101 100  CO2 25 26 25   GLUCOSE 104* 126* 120*  BUN 29* 32* 27*  CALCIUM 9.4 9.3 9.1  CREATININE 2.77* 2.38* 2.41*  GFRNONAA 24* 28* 28*    LIVER FUNCTION TESTS: No results for input(s): "BILITOT", "AST", "ALT", "ALKPHOS", "PROT", "ALBUMIN" in the last 8760 hours.  TUMOR MARKERS: No results for input(s): "AFPTM", "CEA", "CA199", "CHROMGRNA" in the last 8760 hours.  Assessment and Plan: 73 y.o. male  with PMH sig for AAA, arthritis, hearing loss, CKD, DM, HTN, GERD, HLD, OSA, PE, prostate/skin cancer and urolithiasis. He underwent cystoscopy with left ureteroscopy with laser lithotripsy most recently in 05/2022 for the treatment of a distal left ureteral stone as well as a renal stone.  Apparently, postoperatively he had some left-sided flank pain and CT imaging a few months later revealed a distal ureteral stone.  Ultimately, his pain dissipated and was assumed to have passed the stone.  Recently, he was found to have an elevated creatinine which prompted imaging that revealed left hydronephrosis.  CT A/P revealed distal left ureteral stone with hydronephrosis and some atrophy of the left kidney. On 07/24/23 he underwent cysto/ureteroscopy which revealed: Cystoscopy demonstrated mildly obstructing prostate with no suspicious bladder lesions Left retrograde pyelogram with obstruction of the distal left ureter with no passage of contrast beyond 2 cm of the distal ureter. Left diagnostic ureteroscopy  demonstrated a blind-ending ureter approximately 2 cm proximal to the left UVJ.  Unable to pass a wire into the proximal ureter beyond this level obstruction.  He is status post left percutaneous nephrostomy on 08/26/2023; recent left nephrostogram on 4/1 showed at least partial obstruction at the left UPJ.  He presents today  for left ureteral stent placement.Risks and benefits of left ureteral stent placement was discussed with the patient including, but not limited to, infection, bleeding, significant bleeding causing loss or decrease in renal function or damage to adjacent structures, inability to place stent.  All of the patient's questions were answered, patient is agreeable to proceed.  Consent signed and in chart.      Thank you for allowing our service to participate in Joseph Pham 's care.  Electronically Signed: D. Jeananne Rama, PA-C   09/07/2023, 2:57 PM      I spent a total of    15 Minutes in face to face in clinical consultation, greater than 50% of which was counseling/coordinating care for left nephrostogram with attempted left ureteral stent placement

## 2023-09-08 ENCOUNTER — Other Ambulatory Visit (HOSPITAL_COMMUNITY): Payer: Self-pay | Admitting: Interventional Radiology

## 2023-09-08 ENCOUNTER — Encounter (HOSPITAL_COMMUNITY): Payer: Self-pay

## 2023-09-08 ENCOUNTER — Ambulatory Visit (HOSPITAL_COMMUNITY)
Admission: RE | Admit: 2023-09-08 | Discharge: 2023-09-08 | Disposition: A | Discharge status: Home / Self Care | Source: Ambulatory Visit | Attending: Interventional Radiology | Admitting: Interventional Radiology

## 2023-09-08 ENCOUNTER — Ambulatory Visit (HOSPITAL_COMMUNITY)
Admission: RE | Admit: 2023-09-08 | Discharge: 2023-09-08 | Disposition: A | Discharge status: Home / Self Care | Source: Ambulatory Visit | Attending: Interventional Radiology

## 2023-09-08 DIAGNOSIS — Z436 Encounter for attention to other artificial openings of urinary tract: Secondary | ICD-10-CM | POA: Diagnosis not present

## 2023-09-08 DIAGNOSIS — Z7984 Long term (current) use of oral hypoglycemic drugs: Secondary | ICD-10-CM | POA: Diagnosis not present

## 2023-09-08 DIAGNOSIS — G4733 Obstructive sleep apnea (adult) (pediatric): Secondary | ICD-10-CM | POA: Diagnosis not present

## 2023-09-08 DIAGNOSIS — Z79899 Other long term (current) drug therapy: Secondary | ICD-10-CM | POA: Insufficient documentation

## 2023-09-08 DIAGNOSIS — Z87891 Personal history of nicotine dependence: Secondary | ICD-10-CM | POA: Diagnosis not present

## 2023-09-08 DIAGNOSIS — N182 Chronic kidney disease, stage 2 (mild): Secondary | ICD-10-CM | POA: Diagnosis not present

## 2023-09-08 DIAGNOSIS — N1339 Other hydronephrosis: Secondary | ICD-10-CM

## 2023-09-08 DIAGNOSIS — I129 Hypertensive chronic kidney disease with stage 1 through stage 4 chronic kidney disease, or unspecified chronic kidney disease: Secondary | ICD-10-CM | POA: Insufficient documentation

## 2023-09-08 DIAGNOSIS — N135 Crossing vessel and stricture of ureter without hydronephrosis: Secondary | ICD-10-CM | POA: Diagnosis not present

## 2023-09-08 DIAGNOSIS — K219 Gastro-esophageal reflux disease without esophagitis: Secondary | ICD-10-CM | POA: Insufficient documentation

## 2023-09-08 DIAGNOSIS — E1122 Type 2 diabetes mellitus with diabetic chronic kidney disease: Secondary | ICD-10-CM | POA: Diagnosis not present

## 2023-09-08 DIAGNOSIS — Z85828 Personal history of other malignant neoplasm of skin: Secondary | ICD-10-CM | POA: Diagnosis not present

## 2023-09-08 DIAGNOSIS — E785 Hyperlipidemia, unspecified: Secondary | ICD-10-CM | POA: Insufficient documentation

## 2023-09-08 HISTORY — PX: IR NEPHROSTOGRAM LEFT THRU EXISTING ACCESS: IMG6061

## 2023-09-08 LAB — CBC WITH DIFFERENTIAL/PLATELET
Abs Immature Granulocytes: 0.07 10*3/uL (ref 0.00–0.07)
Basophils Absolute: 0 10*3/uL (ref 0.0–0.1)
Basophils Relative: 1 %
Eosinophils Absolute: 0.2 10*3/uL (ref 0.0–0.5)
Eosinophils Relative: 3 %
HCT: 39.5 % (ref 39.0–52.0)
Hemoglobin: 13.1 g/dL (ref 13.0–17.0)
Immature Granulocytes: 1 %
Lymphocytes Relative: 33 %
Lymphs Abs: 1.9 10*3/uL (ref 0.7–4.0)
MCH: 27.6 pg (ref 26.0–34.0)
MCHC: 33.2 g/dL (ref 30.0–36.0)
MCV: 83.3 fL (ref 80.0–100.0)
Monocytes Absolute: 0.6 10*3/uL (ref 0.1–1.0)
Monocytes Relative: 10 %
Neutro Abs: 3 10*3/uL (ref 1.7–7.7)
Neutrophils Relative %: 52 %
Platelets: 222 10*3/uL (ref 150–400)
RBC: 4.74 MIL/uL (ref 4.22–5.81)
RDW: 14.6 % (ref 11.5–15.5)
WBC: 5.7 10*3/uL (ref 4.0–10.5)
nRBC: 0 % (ref 0.0–0.2)

## 2023-09-08 LAB — BASIC METABOLIC PANEL WITH GFR
Anion gap: 10 (ref 5–15)
BUN: 27 mg/dL — ABNORMAL HIGH (ref 8–23)
CO2: 24 mmol/L (ref 22–32)
Calcium: 8.9 mg/dL (ref 8.9–10.3)
Chloride: 101 mmol/L (ref 98–111)
Creatinine, Ser: 2.2 mg/dL — ABNORMAL HIGH (ref 0.61–1.24)
GFR, Estimated: 31 mL/min — ABNORMAL LOW (ref >60)
Glucose, Bld: 115 mg/dL — ABNORMAL HIGH (ref 70–99)
Potassium: 3.8 mmol/L (ref 3.5–5.1)
Sodium: 135 mmol/L (ref 135–145)

## 2023-09-08 LAB — PROTIME-INR
INR: 1 (ref 0.8–1.2)
Prothrombin Time: 13.6 s (ref 11.4–15.2)

## 2023-09-08 LAB — GLUCOSE, CAPILLARY: Glucose-Capillary: 116 mg/dL — ABNORMAL HIGH (ref 70–99)

## 2023-09-08 MED ORDER — SODIUM CHLORIDE 0.9% FLUSH: 5.0000 mL | Freq: Three times a day (TID) | INTRAVENOUS | Status: DC

## 2023-09-08 MED ORDER — SODIUM CHLORIDE 0.9 % IV SOLN: INTRAVENOUS | Status: DC

## 2023-09-08 MED ORDER — LIDOCAINE-EPINEPHRINE 1 %-1:100000 IJ SOLN
20.0000 mL | Freq: Once | INTRAMUSCULAR | Status: AC
Start: 2023-09-08 — End: 2023-09-08
  Administered 2023-09-08: 10 mL via INTRADERMAL

## 2023-09-08 MED ORDER — FENTANYL CITRATE (PF) 100 MCG/2ML IJ SOLN
INTRAMUSCULAR | Status: AC
  Filled 2023-09-08: qty 2

## 2023-09-08 MED ORDER — LEVOFLOXACIN IN D5W 500 MG/100ML IV SOLN
500.0000 mg | Freq: Once | INTRAVENOUS | Status: AC
  Administered 2023-09-08: 500 mg via INTRAVENOUS
  Filled 2023-09-08: qty 100

## 2023-09-08 MED ORDER — LIDOCAINE-EPINEPHRINE 1 %-1:100000 IJ SOLN
INTRAMUSCULAR | Status: AC
  Filled 2023-09-08: qty 1

## 2023-09-08 MED ORDER — IOHEXOL 300 MG/ML  SOLN
50.0000 mL | Freq: Once | INTRAMUSCULAR | Status: AC | PRN
  Administered 2023-09-08: 20 mL

## 2023-09-08 MED ORDER — MIDAZOLAM HCL 2 MG/2ML IJ SOLN
INTRAMUSCULAR | Status: AC
  Filled 2023-09-08: qty 2

## 2023-09-08 MED ORDER — FENTANYL CITRATE (PF) 100 MCG/2ML IJ SOLN
INTRAMUSCULAR | Status: AC | PRN
  Administered 2023-09-08: 50 ug via INTRAVENOUS

## 2023-09-08 MED ORDER — LIDOCAINE HCL 1 % IJ SOLN
INTRAMUSCULAR | Status: AC
  Filled 2023-09-08: qty 20

## 2023-09-08 MED ORDER — MIDAZOLAM HCL 2 MG/2ML IJ SOLN
INTRAMUSCULAR | Status: AC | PRN
  Administered 2023-09-08: 1 mg via INTRAVENOUS

## 2023-09-08 NOTE — Procedures (Signed)
 Vascular and Interventional Radiology Procedure Note  Patient: [name] DOB: 04-05-1951 Medical Record Number: [id number] Note Date/Time: 09/08/23 2:37 PM   Performing Physician: Roanna Banning, MD Assistant(s): None  Diagnosis:  Internalization attempt of L PCN  Procedure:  LEFT ANTEROGRADE NEPHROSTOGRAM ABORTED LEFT NEPHROURETERAL CONVERSION LEFT NEPHROSTOMY TUBE EXCHANGE  Anesthesia: Conscious Sedation Complications: None Estimated Blood Loss:  0 mL Specimens:  None  Findings:  L distal ureteral obstruction at L UVJ, could not advance into urinary bladder Aborted internalization to JJ stent Successful exchange for a 10 F nephrostomy tube into the left kidney(s).   Plan: Resume previous  care. Follow up for routine nephrostomy tube exchange in 8 week(s).   See detailed procedure note with images in PACS. The patient tolerated the procedure well without incident or complication and was returned to Recovery in stable condition.    Roanna Banning, MD [facility] Inter[facility] Radiology [facility] Radiology   Pager. [phone] Clinic. [phone]

## 2023-09-09 ENCOUNTER — Other Ambulatory Visit (HOSPITAL_COMMUNITY): Payer: Self-pay | Admitting: Interventional Radiology

## 2023-09-09 ENCOUNTER — Encounter (HOSPITAL_COMMUNITY): Payer: Self-pay | Admitting: Radiology

## 2023-09-09 DIAGNOSIS — N1339 Other hydronephrosis: Secondary | ICD-10-CM

## 2023-09-10 DIAGNOSIS — N200 Calculus of kidney: Secondary | ICD-10-CM | POA: Diagnosis not present

## 2023-09-10 DIAGNOSIS — N130 Hydronephrosis with ureteropelvic junction obstruction: Secondary | ICD-10-CM | POA: Diagnosis not present

## 2023-09-29 ENCOUNTER — Ambulatory Visit (HOSPITAL_BASED_OUTPATIENT_CLINIC_OR_DEPARTMENT_OTHER)
Admission: RE | Admit: 2023-09-29 | Discharge: 2023-09-29 | Disposition: A | Discharge status: Home / Self Care | Source: Ambulatory Visit | Attending: Cardiology | Admitting: Cardiology

## 2023-09-29 DIAGNOSIS — I700 Atherosclerosis of aorta: Secondary | ICD-10-CM | POA: Diagnosis not present

## 2023-09-29 DIAGNOSIS — N202 Calculus of kidney with calculus of ureter: Secondary | ICD-10-CM | POA: Diagnosis not present

## 2023-09-29 DIAGNOSIS — I7121 Aneurysm of the ascending aorta, without rupture: Secondary | ICD-10-CM

## 2023-09-29 DIAGNOSIS — K429 Umbilical hernia without obstruction or gangrene: Secondary | ICD-10-CM | POA: Diagnosis not present

## 2023-09-29 DIAGNOSIS — N130 Hydronephrosis with ureteropelvic junction obstruction: Secondary | ICD-10-CM | POA: Diagnosis not present

## 2023-09-29 DIAGNOSIS — R8271 Bacteriuria: Secondary | ICD-10-CM | POA: Diagnosis not present

## 2023-10-01 ENCOUNTER — Other Ambulatory Visit: Payer: Self-pay

## 2023-10-01 ENCOUNTER — Telehealth: Payer: Self-pay | Admitting: Cardiology

## 2023-10-01 NOTE — Telephone Encounter (Signed)
 Called the patient and he reported that while he was at his [facility] appointment the doctor noticed that he had a build up of fluid in his leg and asked him if he was being treated for heart failure and advised the patient to follow up with his cardiologist. He sees [facility] because he has had "multiple kidney stones and now has a blockage from his kidney to just above his bladder". He has no SOB or weight gain and takes no diuretics. Please advise.

## 2023-10-01 NOTE — Telephone Encounter (Signed)
 Pt c/o swelling/edema: STAT if pt has developed SOB within 24 hours  If swelling, where is the swelling located? Legs   How much weight have you gained and in what time span? Not sure   Have you gained 2 pounds in a day or 5 pounds in a week? Not sure   Do you have a log of your daily weights (if so, list)?  No   Are you currently taking a fluid pill? No   Are you currently SOB? No   Have you traveled recently in a car or plane for an extended period of time? No

## 2023-10-06 ENCOUNTER — Telehealth: Payer: Self-pay | Admitting: Emergency Medicine

## 2023-10-06 NOTE — Telephone Encounter (Signed)
-----   Message from Terrance Ferretti sent at 10/05/2023  8:36 AM EDT ----- CT of his chest for evaluation of his aortic aneurysm, no significant change.  Repeat CT of the chest in 1 year without contrast.

## 2023-10-06 NOTE — Telephone Encounter (Signed)
 Called and spoke to patient.   Reviewed CT results with patient as per [facility] note. He verbalized understanding.   Patient is complaining of lower extremity swelling. Spoke to DOD, Dr. Ronell Coe who recommends that patient come in for an appointment. Appt made on 5/13. Patient had no further questions.

## 2023-10-08 ENCOUNTER — Other Ambulatory Visit: Payer: Self-pay

## 2023-10-08 DIAGNOSIS — N184 Chronic kidney disease, stage 4 (severe): Secondary | ICD-10-CM

## 2023-10-08 NOTE — Telephone Encounter (Signed)
 Called the patient and informed him of Dr. Tonja Fray recommendation below:  "Lets get proBNP and Chem-7 to see if there is any chance related to the heart"   Patient verbalized understanding and had no further questions at this time. Labs were ordered via Epic.

## 2023-10-09 DIAGNOSIS — R600 Localized edema: Secondary | ICD-10-CM | POA: Diagnosis not present

## 2023-10-09 DIAGNOSIS — N184 Chronic kidney disease, stage 4 (severe): Secondary | ICD-10-CM | POA: Diagnosis not present

## 2023-10-09 DIAGNOSIS — I2699 Other pulmonary embolism without acute cor pulmonale: Secondary | ICD-10-CM | POA: Diagnosis not present

## 2023-10-09 DIAGNOSIS — I10 Essential (primary) hypertension: Secondary | ICD-10-CM | POA: Diagnosis not present

## 2023-10-10 LAB — BASIC METABOLIC PANEL WITH GFR
BUN/Creatinine Ratio: 13 (ref 10–24)
BUN: 29 mg/dL — ABNORMAL HIGH (ref 8–27)
CO2: 20 mmol/L (ref 20–29)
Calcium: 9.1 mg/dL (ref 8.6–10.2)
Chloride: 102 mmol/L (ref 96–106)
Creatinine, Ser: 2.28 mg/dL — ABNORMAL HIGH (ref 0.76–1.27)
Glucose: 125 mg/dL — ABNORMAL HIGH (ref 70–99)
Potassium: 4 mmol/L (ref 3.5–5.2)
Sodium: 138 mmol/L (ref 134–144)
eGFR: 30 mL/min/{1.73_m2} — ABNORMAL LOW (ref >59)

## 2023-10-10 LAB — PRO B NATRIURETIC PEPTIDE: NT-Pro BNP: 142 pg/mL (ref 0–376)

## 2023-10-12 NOTE — Progress Notes (Addendum)
 Cardiology Office Note:  .   Date:  10/13/2023  ID:  Joseph Pham, DOB 09/07/1950, MRN [id number] PCP: Russell Court, DO  [facility] Providers Cardiologist:  Zoe Hinds, MD    History of Present Illness: .   Joseph Pham is a 73 y.o. male with a past medical history of ascending aortic enlargement, aortic atherosclerosis noted on CT imaging, hypertension, history of PE, OSA on CPAP, GERD, DM 2, history of prostate cancer, CKD, dyslipidemia.  09/29/2023 CT of his chest ascending aortic aneurysm 4.4 cm, scattered calcifications in the aorta and coronary arteries 01/14/2023 CT of chest ascending aortic aneurysm 4.1 cm, mild aortic atherosclerosis 05/21/2020 AAA duplex no evidence of abdominal aortic aneurysm 02/03/2019 echo EF 60 to 65%, impaired relaxation, mild thickening of the aortic valve, mild aortic insufficiency 05/18/2018 Lexiscan  no ischemia, low risk study  He established with Dr. Sandee Crook in 2017 for evaluation and management of hypertension.  In 2019 he had a Lexiscan  revealing no ischemia.  Most recently evaluated by Dr. Sandee Crook on 04/17/2022, was doing well from a cardiac perspective and advised to follow-up in 1 year.  Evaluated by myself on 05/08/2023, stable from a cardiac perspective, recently evaluated by his nephrologist and his lisinopril  has been discontinued, we arranged repeat CT of his chest for surveillance of his aortic aneurysm revealing no significant changes.  Since he was last evaluated in our office he was evaluated by [facility], they noted edema and he was advised follow-up with cardiology.  He presents today for follow up of pedal edema after recently seeing his urologist.  He does not have any formal complaints from a cardiac perspective.  He is relatively sedentary secondary to back pain, and now ongoing neurological issues.  He does not experience any episodes of angina.  He does have some pedal edema, this was present at his last  office visit as well.  He denies orthopnea, or changes with his weight. He denies chest pain, palpitations, dyspnea, pnd, orthopnea, n, v, dizziness, syncope, edema, weight gain, or early satiety.    ROS: Review of Systems  Cardiovascular:  Positive for leg swelling.  Musculoskeletal:  Positive for back pain and myalgias.  All other systems reviewed and are negative.    Studies Reviewed: .         Risk Assessment/Calculations:        Physical Exam:   VS:  BP (!) 140/80   Pulse 60   Ht 5\' 6"  (1.676 m)   Wt 258 lb (117 kg)   SpO2 94%   BMI 41.64 kg/m    Wt Readings from Last 3 Encounters:  10/13/23 258 lb (117 kg)  08/26/23 249 lb (112.9 kg)  07/24/23 253 lb 8.5 oz (115 kg)    GEN: Well nourished, well developed in no acute distress NECK: No JVD; No carotid bruits CARDIAC: RRR, no murmurs, rubs, gallops RESPIRATORY:  Clear to auscultation without rales, wheezing or rhonchi  ABDOMEN: Soft, non-tender, non-distended EXTREMITIES:  non-pitting edema; No deformity   ASSESSMENT AND PLAN: .   Ascending aortic dilatation-CT of the chest in April of this year revealed dilatation of 4.4 cm.  Plan to repeat imaging in 1 year.  Aortic atherosclerosis-this was noted on CT imaging, Stable with no anginal symptoms. No indication for ischemic evaluation.  Most recent Lexiscan  was in 2019 which revealed no ischemia.  Continue Lipitor 10 mg daily, currently on Xarelto .  CKD stage IV-follows with nephrology, recently evaluated by them creatinine 2.28  his lisinopril  was stopped.  Careful titration of diuretic and antihypertensive agents.   Hypertension-he was apparently having episodes of hypotension at home as well prior to his lisinopril  being held.  In the office today it is elevated at 140/80, but reports at home around 130/80, continue Norvasc  5 mg daily. Will have him keep a BP log at home.   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  Pedal edema with DOE - echo in 2020  with grade I DD, will repeat echocardiogram. No appreciable HF symptoms today but will repeat proBNP.      **addendum 11/06/23 -echo was essentially unchanged from 2020, proBNP was normal. According to the Revised Cardiac Risk Index (RCRI), his Perioperative Risk of Major Cardiac Event is (%): 0.4 His Functional Capacity in METs is: 5.07 according to the Duke Activity Status Index (DASI). Therefore, based on ACC/AHA guidelines, patient would be at acceptable risk for the planned procedure without further cardiovascular testing. I will route this recommendation to the requesting party via Epic fax function. Per office protocol, patient can hold Xarelto  for 2 days prior to procedure.       Dispo: echo, proBNP, BMET, BP log x 2 weeks, follow up in 6 months.   Signed, Terrance Ferretti, NP

## 2023-10-13 ENCOUNTER — Ambulatory Visit: Attending: Cardiology | Admitting: Cardiology

## 2023-10-13 ENCOUNTER — Encounter: Payer: Self-pay | Admitting: Cardiology

## 2023-10-13 VITALS — BP 140/80 | HR 60 | Ht 66.0 in | Wt 258.0 lb

## 2023-10-13 DIAGNOSIS — R0609 Other forms of dyspnea: Secondary | ICD-10-CM | POA: Diagnosis not present

## 2023-10-13 DIAGNOSIS — I10 Essential (primary) hypertension: Secondary | ICD-10-CM

## 2023-10-13 DIAGNOSIS — N184 Chronic kidney disease, stage 4 (severe): Secondary | ICD-10-CM

## 2023-10-13 DIAGNOSIS — I7789 Other specified disorders of arteries and arterioles: Secondary | ICD-10-CM | POA: Diagnosis not present

## 2023-10-13 DIAGNOSIS — I700 Atherosclerosis of aorta: Secondary | ICD-10-CM

## 2023-10-13 DIAGNOSIS — R600 Localized edema: Secondary | ICD-10-CM | POA: Diagnosis not present

## 2023-10-13 NOTE — Patient Instructions (Signed)
 Please keep a BP log for 2 weeks and send by MyChart or mail.                         Name and DOB__________________________ Dr. Lafayette Pierre [address] [place], [place] [address]  Blood Pressure Record Sheet To take your blood pressure, you will need a blood pressure machine. You can buy a blood pressure machine (blood pressure monitor) at your clinic, drug store, or online. When choosing one, consider: An automatic monitor that has an arm cuff. A cuff that wraps snugly around your upper arm. You should be able to fit only one finger between your arm and the cuff. A device that stores blood pressure reading results. Do not choose a monitor that measures your blood pressure from your wrist or finger. Follow your health care provider's instructions for how to take your blood pressure. To use this form: Get one reading in the morning (a.m.) 1-2 hours after you take any medicines. Get one reading in the evening (p.m.) before supper.   Blood pressure log Date: _______________________  a.m. _____________________(1st reading) HR___________            p.m. _____________________(2nd reading) HR__________  Date: _______________________  a.m. _____________________(1st reading) HR___________            p.m. _____________________(2nd reading) HR__________  Date: _______________________  a.m. _____________________(1st reading) HR___________            p.m. _____________________(2nd reading) HR__________  Date: _______________________  a.m. _____________________(1st reading) HR___________            p.m. _____________________(2nd reading) HR__________  Date: _______________________  a.m. _____________________(1st reading) HR___________            p.m. _____________________(2nd reading) HR__________  Date: _______________________  a.m. _____________________(1st reading) HR___________            p.m. _____________________(2nd reading) HR__________  Date: _______________________  a.m.  _____________________(1st reading) HR___________            p.m. _____________________(2nd reading) HR__________   This information is not intended to replace advice given to you by your health care provider. Make sure you discuss any questions you have with your health care provider. Document Revised: 09/07/2019 Document Reviewed: 09/07/2019 Elsevier Patient Education  2021 Elsevier Inc.   Medication Instructions:  Your physician recommends that you continue on your current medications as directed. Please refer to the Current Medication list given to you today.  *If you need a refill on your cardiac medications before your next appointment, please call your pharmacy*   Lab Work: Your physician recommends that you have a ProBNP and BMP today in the office.  If you have labs (blood work) drawn today and your tests are completely normal, you will receive your results only by: MyChart Message (if you have MyChart) OR A paper copy in the mail If you have any lab test that is abnormal or we need to change your treatment, we will call you to review the results.   Testing/Procedures: Your physician has requested that you have an echocardiogram. Echocardiography is a painless test that uses sound waves to create images of your heart. It provides your doctor with information about the size and shape of your heart and how well your heart's chambers and valves are working. This procedure takes approximately one hour. There are no restrictions for this procedure. Please do NOT wear cologne, perfume, aftershave, or lotions (deodorant is allowed). Please arrive 15 minutes prior to your appointment  time.     Follow-Up: At [facility], you and your health needs are our priority.  As part of our continuing mission to provide you with exceptional heart care, we have created designated Provider Care Teams.  These Care Teams include your primary Cardiologist (physician) and Advanced Practice  Providers (APPs -  Physician Assistants and Nurse Practitioners) who all work together to provide you with the care you need, when you need it.  We recommend signing up for the patient portal called "MyChart".  Sign up information is provided on this After Visit Summary.  MyChart is used to connect with patients for Virtual Visits (Telemedicine).  Patients are able to view lab/test results, encounter notes, upcoming appointments, etc.  Non-urgent messages can be sent to your provider as well.   To learn more about what you can do with MyChart, go to [email or web address].    Your next appointment:   6 month(s)  The format for your next appointment:   In Person  Provider:   Zoe Hinds, MD or Pattricia Bores, NP [facility])   Other Instructions Echocardiogram An echocardiogram is a test that uses sound waves (ultrasound) to produce images of the heart. Images from an echocardiogram can provide important information about: Heart size and shape. The size and thickness and movement of your heart's walls. Heart muscle function and strength. Heart valve function or if you have stenosis. Stenosis is when the heart valves are too narrow. If blood is flowing backward through the heart valves (regurgitation). A tumor or infectious growth around the heart valves. Areas of heart muscle that are not working well because of poor blood flow or injury from a heart attack. Aneurysm detection. An aneurysm is a weak or damaged part of an artery wall. The wall bulges out from the normal force of blood pumping through the body. Tell a health care provider about: Any allergies you have. All medicines you are taking, including vitamins, herbs, eye drops, creams, and over-the-counter medicines. Any blood disorders you have. Any surgeries you have had. Any medical conditions you have. Whether you are pregnant or may be pregnant. What are the risks? Generally, this is a safe test. However, problems may  occur, including an allergic reaction to dye (contrast) that may be used during the test. What happens before the test? No specific preparation is needed. You may eat and drink normally. What happens during the test? You will take off your clothes from the waist up and put on a hospital gown. Electrodes or electrocardiogram (ECG)patches may be placed on your chest. The electrodes or patches are then connected to a device that monitors your heart rate and rhythm. You will lie down on a table for an ultrasound exam. A gel will be applied to your chest to help sound waves pass through your skin. A handheld device, called a transducer, will be pressed against your chest and moved over your heart. The transducer produces sound waves that travel to your heart and bounce back (or "echo" back) to the transducer. These sound waves will be captured in real-time and changed into images of your heart that can be viewed on a video monitor. The images will be recorded on a computer and reviewed by your health care provider. You may be asked to change positions or hold your breath for a short time. This makes it easier to get different views or better views of your heart. In some cases, you may receive contrast through an IV in one of your veins.  This can improve the quality of the pictures from your heart. The procedure may vary among health care providers and hospitals.   What can I expect after the test? You may return to your normal, everyday life, including diet, activities, and medicines, unless your health care provider tells you not to do that. Follow these instructions at home: It is up to you to get the results of your test. Ask your health care provider, or the department that is doing the test, when your results will be ready. Keep all follow-up visits. This is important. Summary An echocardiogram is a test that uses sound waves (ultrasound) to produce images of the heart. Images from an  echocardiogram can provide important information about the size and shape of your heart, heart muscle function, heart valve function, and other possible heart problems. You do not need to do anything to prepare before this test. You may eat and drink normally. After the echocardiogram is completed, you may return to your normal, everyday life, unless your health care provider tells you not to do that. This information is not intended to replace advice given to you by your health care provider. Make sure you discuss any questions you have with your health care provider. Document Revised: 01/10/2020 Document Reviewed: 01/10/2020 Elsevier Patient Education  2021 Elsevier Inc.   Important Information About Sugar

## 2023-10-14 ENCOUNTER — Ambulatory Visit: Payer: Self-pay | Admitting: Cardiology

## 2023-10-14 LAB — BASIC METABOLIC PANEL WITH GFR
BUN/Creatinine Ratio: 13 (ref 10–24)
BUN: 29 mg/dL — ABNORMAL HIGH (ref 8–27)
CO2: 20 mmol/L (ref 20–29)
Calcium: 9.4 mg/dL (ref 8.6–10.2)
Chloride: 101 mmol/L (ref 96–106)
Creatinine, Ser: 2.31 mg/dL — ABNORMAL HIGH (ref 0.76–1.27)
Glucose: 134 mg/dL — ABNORMAL HIGH (ref 70–99)
Potassium: 4.2 mmol/L (ref 3.5–5.2)
Sodium: 139 mmol/L (ref 134–144)
eGFR: 29 mL/min/1.73 — ABNORMAL LOW

## 2023-10-14 LAB — PRO B NATRIURETIC PEPTIDE: NT-Pro BNP: 162 pg/mL (ref 0–376)

## 2023-10-14 NOTE — Telephone Encounter (Signed)
 No VM or AM

## 2023-10-14 NOTE — Telephone Encounter (Signed)
-----   Message from Terrance Ferretti sent at 10/14/2023  7:54 AM EDT ----- Stable kidney dysfunction.  No evidence of heart failure on lab work, but will need to look at the echo.

## 2023-10-19 DIAGNOSIS — R310 Gross hematuria: Secondary | ICD-10-CM | POA: Diagnosis not present

## 2023-10-19 DIAGNOSIS — N135 Crossing vessel and stricture of ureter without hydronephrosis: Secondary | ICD-10-CM | POA: Diagnosis not present

## 2023-10-19 DIAGNOSIS — N130 Hydronephrosis with ureteropelvic junction obstruction: Secondary | ICD-10-CM | POA: Diagnosis not present

## 2023-10-19 DIAGNOSIS — N202 Calculus of kidney with calculus of ureter: Secondary | ICD-10-CM | POA: Diagnosis not present

## 2023-10-21 ENCOUNTER — Ambulatory Visit: Payer: Self-pay | Admitting: Cardiology

## 2023-10-23 ENCOUNTER — Telehealth: Payer: Self-pay | Admitting: Cardiology

## 2023-10-23 ENCOUNTER — Other Ambulatory Visit: Payer: Self-pay | Admitting: Urology

## 2023-10-23 NOTE — Telephone Encounter (Signed)
 Good Morning [facility]  We have received a surgical clearance request for Mr. Fancher for a robotic assisted laparoscopic hernia repair. They were seen recently in clinic on 10/13/2023. Can you please comment on surgical clearance for his upcoming hernia repair. Please forward you guidance and recommendations to [facility] PREOP   Thank you, Charles Connor, NP

## 2023-10-23 NOTE — Telephone Encounter (Signed)
 Covering for Pattricia Bores, NP while she is out of office. She returns to clinic 11/02/23, will leave in her basket for review as procedure not until 12/16/23.   Will route to PharmD team for input on holding Xarelto .   Clearnce Curia, NP

## 2023-10-23 NOTE — Telephone Encounter (Signed)
   Pre-operative Risk Assessment    Patient Name: Joseph Pham  DOB: Oct 10, 1950 MRN: [id number]   Date of last office visit: 10/13/23  Date of next office visit: due November 2025    Request for Surgical Clearance    Procedure:  Robot-assisted laparoscopic ureteral reimplantation, Hernia Repair   Date of Surgery:  Clearance 12/16/23                                Surgeon:  Dr. Floreen Hunger Group or Practice Name:  [facility]  Phone number:  [phone]  Fax number:  [phone]    Type of Clearance Requested:   - Medical  - Pharmacy:  Hold Rivaroxaban  (Xarelto )     Type of Anesthesia:  General    Additional requests/questions:    Virgie Griffith   10/23/2023, 9:37 AM

## 2023-10-23 NOTE — Telephone Encounter (Signed)
 Patient with diagnosis of  hx of PE/DVT  on Xarelto  for anticoagulation.    Procedure: Robot-assisted laparoscopic ureteral reimplantation, Hernia Repair  Date of procedure: 12/16/23     CrCl 35 mL/min  Platelet count 222 K  Per office protocol, patient can hold Xarelto  for 2 days prior to procedure.     **This guidance is not considered finalized until pre-operative APP has relayed final recommendations.**

## 2023-10-27 ENCOUNTER — Other Ambulatory Visit: Payer: Self-pay | Admitting: Urology

## 2023-11-03 ENCOUNTER — Other Ambulatory Visit (HOSPITAL_COMMUNITY): Payer: Self-pay | Admitting: Interventional Radiology

## 2023-11-03 ENCOUNTER — Encounter (HOSPITAL_COMMUNITY): Payer: Self-pay | Admitting: Interventional Radiology

## 2023-11-03 ENCOUNTER — Ambulatory Visit (HOSPITAL_COMMUNITY)
Admission: RE | Admit: 2023-11-03 | Discharge: 2023-11-03 | Disposition: A | Discharge status: Home / Self Care | Source: Ambulatory Visit | Attending: Interventional Radiology | Admitting: Interventional Radiology

## 2023-11-03 DIAGNOSIS — N1339 Other hydronephrosis: Secondary | ICD-10-CM | POA: Insufficient documentation

## 2023-11-03 DIAGNOSIS — Z436 Encounter for attention to other artificial openings of urinary tract: Secondary | ICD-10-CM | POA: Diagnosis not present

## 2023-11-03 MED ORDER — LIDOCAINE HCL 1 % IJ SOLN
INTRAMUSCULAR | Status: AC
  Filled 2023-11-03: qty 20

## 2023-11-03 MED ORDER — LIDOCAINE HCL 1 % IJ SOLN: 20.0000 mL | Freq: Once | INTRAMUSCULAR | Status: DC

## 2023-11-03 MED ORDER — IOHEXOL 300 MG/ML  SOLN: 50.0000 mL | Freq: Once | INTRAMUSCULAR | Status: DC | PRN

## 2023-11-05 ENCOUNTER — Ambulatory Visit: Attending: Cardiology

## 2023-11-05 DIAGNOSIS — R0609 Other forms of dyspnea: Secondary | ICD-10-CM | POA: Diagnosis not present

## 2023-11-05 LAB — ECHOCARDIOGRAM COMPLETE
Area-P 1/2: 3.83 cm2
P 1/2 time: 1181 ms
S' Lateral: 3.4 cm

## 2023-11-05 MED ORDER — PERFLUTREN LIPID MICROSPHERE
1.0000 mL | INTRAVENOUS | Status: AC | PRN
  Administered 2023-11-05: 10 mL via INTRAVENOUS

## 2023-11-13 ENCOUNTER — Telehealth: Payer: Self-pay | Admitting: Cardiology

## 2023-11-13 NOTE — Telephone Encounter (Signed)
 Attempted to call the patient. Patient did not answer the phone and there was a busy signal after 2 attempts to call the patient.

## 2023-11-13 NOTE — Telephone Encounter (Signed)
 Thank you for keeping track of your blood pressure at home, it appears that your blood pressure overall is well-controlled.  I will continue to check it possibly once or twice a week and if you notice that the first number starts to stay around 140 or greater then let our office know.  Otherwise no changes.

## 2023-11-16 NOTE — Telephone Encounter (Signed)
Recommendations reviewed with pt as per Wallis Bamberg NP's note. Pt verbalized understanding and had no additional questions.

## 2023-11-24 DIAGNOSIS — Z01812 Encounter for preprocedural laboratory examination: Secondary | ICD-10-CM | POA: Diagnosis not present

## 2023-11-24 DIAGNOSIS — K429 Umbilical hernia without obstruction or gangrene: Secondary | ICD-10-CM | POA: Diagnosis not present

## 2023-11-24 DIAGNOSIS — N135 Crossing vessel and stricture of ureter without hydronephrosis: Secondary | ICD-10-CM | POA: Diagnosis not present

## 2023-11-26 DIAGNOSIS — E1165 Type 2 diabetes mellitus with hyperglycemia: Secondary | ICD-10-CM | POA: Diagnosis not present

## 2023-11-26 DIAGNOSIS — L570 Actinic keratosis: Secondary | ICD-10-CM | POA: Diagnosis not present

## 2023-11-26 DIAGNOSIS — E7800 Pure hypercholesterolemia, unspecified: Secondary | ICD-10-CM | POA: Diagnosis not present

## 2023-11-26 DIAGNOSIS — N1832 Chronic kidney disease, stage 3b: Secondary | ICD-10-CM | POA: Diagnosis not present

## 2023-11-30 ENCOUNTER — Other Ambulatory Visit: Payer: Self-pay

## 2023-11-30 ENCOUNTER — Emergency Department (HOSPITAL_COMMUNITY)
Admission: EM | Admit: 2023-11-30 | Discharge: 2023-12-01 | Disposition: A | Discharge status: Home / Self Care | Attending: Emergency Medicine | Admitting: Emergency Medicine

## 2023-11-30 ENCOUNTER — Encounter (HOSPITAL_COMMUNITY): Payer: Self-pay

## 2023-11-30 ENCOUNTER — Telehealth: Payer: Self-pay

## 2023-11-30 ENCOUNTER — Emergency Department (HOSPITAL_COMMUNITY)

## 2023-11-30 DIAGNOSIS — M4317 Spondylolisthesis, lumbosacral region: Secondary | ICD-10-CM | POA: Diagnosis not present

## 2023-11-30 DIAGNOSIS — K5730 Diverticulosis of large intestine without perforation or abscess without bleeding: Secondary | ICD-10-CM | POA: Diagnosis not present

## 2023-11-30 DIAGNOSIS — Z8673 Personal history of transient ischemic attack (TIA), and cerebral infarction without residual deficits: Secondary | ICD-10-CM | POA: Diagnosis not present

## 2023-11-30 DIAGNOSIS — I129 Hypertensive chronic kidney disease with stage 1 through stage 4 chronic kidney disease, or unspecified chronic kidney disease: Secondary | ICD-10-CM | POA: Diagnosis not present

## 2023-11-30 DIAGNOSIS — Z79899 Other long term (current) drug therapy: Secondary | ICD-10-CM | POA: Diagnosis not present

## 2023-11-30 DIAGNOSIS — M7989 Other specified soft tissue disorders: Secondary | ICD-10-CM | POA: Diagnosis not present

## 2023-11-30 DIAGNOSIS — K429 Umbilical hernia without obstruction or gangrene: Secondary | ICD-10-CM | POA: Diagnosis not present

## 2023-11-30 DIAGNOSIS — R0602 Shortness of breath: Secondary | ICD-10-CM | POA: Insufficient documentation

## 2023-11-30 DIAGNOSIS — I672 Cerebral atherosclerosis: Secondary | ICD-10-CM | POA: Diagnosis not present

## 2023-11-30 DIAGNOSIS — I6782 Cerebral ischemia: Secondary | ICD-10-CM | POA: Diagnosis not present

## 2023-11-30 DIAGNOSIS — I719 Aortic aneurysm of unspecified site, without rupture: Secondary | ICD-10-CM | POA: Diagnosis not present

## 2023-11-30 DIAGNOSIS — M48061 Spinal stenosis, lumbar region without neurogenic claudication: Secondary | ICD-10-CM | POA: Diagnosis not present

## 2023-11-30 DIAGNOSIS — Z7901 Long term (current) use of anticoagulants: Secondary | ICD-10-CM | POA: Insufficient documentation

## 2023-11-30 DIAGNOSIS — M4807 Spinal stenosis, lumbosacral region: Secondary | ICD-10-CM | POA: Diagnosis not present

## 2023-11-30 DIAGNOSIS — N182 Chronic kidney disease, stage 2 (mild): Secondary | ICD-10-CM | POA: Insufficient documentation

## 2023-11-30 DIAGNOSIS — N200 Calculus of kidney: Secondary | ICD-10-CM | POA: Diagnosis not present

## 2023-11-30 DIAGNOSIS — R0902 Hypoxemia: Secondary | ICD-10-CM | POA: Diagnosis not present

## 2023-11-30 DIAGNOSIS — E1122 Type 2 diabetes mellitus with diabetic chronic kidney disease: Secondary | ICD-10-CM | POA: Diagnosis not present

## 2023-11-30 DIAGNOSIS — I6523 Occlusion and stenosis of bilateral carotid arteries: Secondary | ICD-10-CM | POA: Diagnosis not present

## 2023-11-30 DIAGNOSIS — N261 Atrophy of kidney (terminal): Secondary | ICD-10-CM | POA: Diagnosis not present

## 2023-11-30 DIAGNOSIS — N281 Cyst of kidney, acquired: Secondary | ICD-10-CM | POA: Diagnosis not present

## 2023-11-30 DIAGNOSIS — R55 Syncope and collapse: Secondary | ICD-10-CM | POA: Diagnosis not present

## 2023-11-30 DIAGNOSIS — J9811 Atelectasis: Secondary | ICD-10-CM | POA: Diagnosis not present

## 2023-11-30 DIAGNOSIS — R0989 Other specified symptoms and signs involving the circulatory and respiratory systems: Secondary | ICD-10-CM | POA: Diagnosis not present

## 2023-11-30 DIAGNOSIS — R42 Dizziness and giddiness: Secondary | ICD-10-CM | POA: Diagnosis not present

## 2023-11-30 DIAGNOSIS — I10 Essential (primary) hypertension: Secondary | ICD-10-CM | POA: Diagnosis not present

## 2023-11-30 LAB — CBC
HCT: 41.9 % (ref 39.0–52.0)
Hemoglobin: 14.1 g/dL (ref 13.0–17.0)
MCH: 28.5 pg (ref 26.0–34.0)
MCHC: 33.7 g/dL (ref 30.0–36.0)
MCV: 84.6 fL (ref 80.0–100.0)
Platelets: 178 10*3/uL (ref 150–400)
RBC: 4.95 MIL/uL (ref 4.22–5.81)
RDW: 14.9 % (ref 11.5–15.5)
WBC: 5.9 10*3/uL (ref 4.0–10.5)
nRBC: 0 % (ref 0.0–0.2)

## 2023-11-30 LAB — BASIC METABOLIC PANEL WITH GFR
Anion gap: 11 (ref 5–15)
BUN: 25 mg/dL — ABNORMAL HIGH (ref 8–23)
CO2: 23 mmol/L (ref 22–32)
Calcium: 9.5 mg/dL (ref 8.9–10.3)
Chloride: 103 mmol/L (ref 98–111)
Creatinine, Ser: 2.25 mg/dL — ABNORMAL HIGH (ref 0.61–1.24)
GFR, Estimated: 30 mL/min — ABNORMAL LOW (ref >60)
Glucose, Bld: 116 mg/dL — ABNORMAL HIGH (ref 70–99)
Potassium: 4.1 mmol/L (ref 3.5–5.1)
Sodium: 137 mmol/L (ref 135–145)

## 2023-11-30 NOTE — Telephone Encounter (Signed)
 Pt c/o BP issue: STAT if pt c/o blurred vision, one-sided weakness or slurred speech.  STAT if BP is GREATER than 180/120 TODAY.  STAT if BP is LESS than 90/60 and SYMPTOMATIC TODAY  1. What is your BP concern? Pt states BP elevated   2. Have you taken any BP medication today?Yes  3. What are your last 5 BP readings?143-83 141/88 144/89  4. Are you having any other symptoms (ex. Dizziness, headache, blurred vision, passed out)? Just dizziness

## 2023-11-30 NOTE — ED Triage Notes (Signed)
 Patient BIB [facility] from home for [facility] and dizziness x 3 days, worse with exertion but present at rest. Hx of PE, takes eliquis for same.  BP 150/82 HR 80 96% RA CBG 147 R 16 18 L wrist

## 2023-11-30 NOTE — Telephone Encounter (Signed)
 Called the patient and he reported that his blood pressure has been elevated.  Saturday morning  140/85 HR 70 Sunday morning     141/81 HR 57 Today                      14 1/87 HR 81   He also reported that he has been very dizzy and also has blurry vision. He has an aortic aneurysm and Delon Hoover, NP asked him to contact the office if his blood pressure was over 140 systolic on a consistent basis. Please advise

## 2023-12-01 ENCOUNTER — Emergency Department (HOSPITAL_COMMUNITY)

## 2023-12-01 DIAGNOSIS — M7989 Other specified soft tissue disorders: Secondary | ICD-10-CM

## 2023-12-01 DIAGNOSIS — I6523 Occlusion and stenosis of bilateral carotid arteries: Secondary | ICD-10-CM | POA: Diagnosis not present

## 2023-12-01 DIAGNOSIS — I719 Aortic aneurysm of unspecified site, without rupture: Secondary | ICD-10-CM | POA: Diagnosis not present

## 2023-12-01 DIAGNOSIS — K5730 Diverticulosis of large intestine without perforation or abscess without bleeding: Secondary | ICD-10-CM | POA: Diagnosis not present

## 2023-12-01 DIAGNOSIS — M4317 Spondylolisthesis, lumbosacral region: Secondary | ICD-10-CM | POA: Diagnosis not present

## 2023-12-01 DIAGNOSIS — M4807 Spinal stenosis, lumbosacral region: Secondary | ICD-10-CM | POA: Diagnosis not present

## 2023-12-01 DIAGNOSIS — M48061 Spinal stenosis, lumbar region without neurogenic claudication: Secondary | ICD-10-CM | POA: Diagnosis not present

## 2023-12-01 DIAGNOSIS — N200 Calculus of kidney: Secondary | ICD-10-CM | POA: Diagnosis not present

## 2023-12-01 DIAGNOSIS — I6782 Cerebral ischemia: Secondary | ICD-10-CM | POA: Diagnosis not present

## 2023-12-01 DIAGNOSIS — I672 Cerebral atherosclerosis: Secondary | ICD-10-CM | POA: Diagnosis not present

## 2023-12-01 DIAGNOSIS — N281 Cyst of kidney, acquired: Secondary | ICD-10-CM | POA: Diagnosis not present

## 2023-12-01 DIAGNOSIS — R55 Syncope and collapse: Secondary | ICD-10-CM | POA: Diagnosis not present

## 2023-12-01 DIAGNOSIS — Z8673 Personal history of transient ischemic attack (TIA), and cerebral infarction without residual deficits: Secondary | ICD-10-CM | POA: Diagnosis not present

## 2023-12-01 DIAGNOSIS — R0989 Other specified symptoms and signs involving the circulatory and respiratory systems: Secondary | ICD-10-CM | POA: Diagnosis not present

## 2023-12-01 DIAGNOSIS — R42 Dizziness and giddiness: Secondary | ICD-10-CM | POA: Diagnosis not present

## 2023-12-01 DIAGNOSIS — K429 Umbilical hernia without obstruction or gangrene: Secondary | ICD-10-CM | POA: Diagnosis not present

## 2023-12-01 DIAGNOSIS — N261 Atrophy of kidney (terminal): Secondary | ICD-10-CM | POA: Diagnosis not present

## 2023-12-01 LAB — RESP PANEL BY RT-PCR (RSV, FLU A&B, COVID)  RVPGX2
Influenza A by PCR: NEGATIVE
Influenza B by PCR: NEGATIVE
Resp Syncytial Virus by PCR: NEGATIVE
SARS Coronavirus 2 by RT PCR: NEGATIVE

## 2023-12-01 LAB — BRAIN NATRIURETIC PEPTIDE: B Natriuretic Peptide: 30.7 pg/mL (ref 0.0–100.0)

## 2023-12-01 LAB — TROPONIN I (HIGH SENSITIVITY): Troponin I (High Sensitivity): 7 ng/L (ref <18)

## 2023-12-01 MED ORDER — PREDNISONE 10 MG (21) PO TBPK: ORAL_TABLET | Freq: Every day | ORAL | 0 refills | Status: DC

## 2023-12-01 MED ORDER — MECLIZINE HCL 25 MG PO TABS
25.0000 mg | ORAL_TABLET | Freq: Once | ORAL | Status: AC
  Administered 2023-12-01: 25 mg via ORAL
  Filled 2023-12-01: qty 1

## 2023-12-01 MED ORDER — MECLIZINE HCL 25 MG PO TABS: 25.0000 mg | ORAL_TABLET | Freq: Three times a day (TID) | ORAL | 0 refills | Status: AC | PRN

## 2023-12-01 MED ORDER — IOHEXOL 350 MG/ML SOLN
75.0000 mL | Freq: Once | INTRAVENOUS | Status: AC | PRN
  Administered 2023-12-01: 75 mL via INTRAVENOUS

## 2023-12-01 MED ORDER — LACTATED RINGERS IV BOLUS
500.0000 mL | Freq: Once | INTRAVENOUS | Status: AC
  Administered 2023-12-01: 500 mL via INTRAVENOUS

## 2023-12-01 MED ORDER — LORAZEPAM 1 MG PO TABS
0.5000 mg | ORAL_TABLET | ORAL | Status: AC | PRN
  Administered 2023-12-01: 0.5 mg via ORAL
  Filled 2023-12-01: qty 1

## 2023-12-01 MED ORDER — OXYCODONE-ACETAMINOPHEN 5-325 MG PO TABS
1.0000 | ORAL_TABLET | Freq: Once | ORAL | Status: AC
  Administered 2023-12-01: 1 via ORAL
  Filled 2023-12-01: qty 1

## 2023-12-01 NOTE — ED Provider Notes (Signed)
 [place] EMERGENCY DEPARTMENT AT [facility] Provider Note   CSN: [id number] Arrival date & time: 11/30/23  8164     Patient presents with: Shortness of Breath and Dizziness   Joseph Lytle Malburg Sr. is a 73 y.o. male.    Shortness of Breath Dizziness Associated symptoms: shortness of breath      73 year old male with extensive past medical history to include HLD, nephrolithiasis, GERD, thoracic aortic aneurysm, HTN, DM2, PE on Xarelto , OSA on CPAP, AAA, CKD stage II, presenting to the emergency department with multiple complaints.  The patient states that his primary complaint is room spinning dizziness that has been present since this past Saturday.  He initially had a intermittently throughout the day on Saturday, noticed worse with positional changes and then has been having fairly constant room spinning dizziness ever since.  He has a history of PE and is on anticoagulation and has not missed any doses.  Initially he states that he has been more short of breath, worse on exertion but still noticing dyspnea at rest.  He has a leg bag in place that is attached to a nephrostomy tube on the left.  He states that he has had worsening swelling in the left lower extremity compared to the right lower extremity.  He endorses heaviness in the left lower extremity as well.  He states that sometimes he is has posterior neck discomfort associated with his vertiginous symptoms.  The patient's vertiginous symptoms have been causing him to lose his balance and have difficulty ambulating over the past few days.  Prior to Admission medications   Medication Sig Start Date End Date Taking? Authorizing Provider  amLODipine  (NORVASC ) 5 MG tablet Take 1 tablet (5 mg total) by mouth daily. 08/25/23 11/23/23  Monetta Redell PARAS, MD  atorvastatin  (LIPITOR) 10 MG tablet Take 10 mg by mouth at bedtime.    [provider]  fexofenadine (ALLEGRA) 180 MG tablet Take 180 mg by mouth in the morning.     [provider]  fluticasone  (FLONASE ) 50 MCG/ACT nasal spray Place 2 sprays into the nose daily as needed for allergies.     [provider]  hydrocortisone 2.5 % cream Apply 1 Application topically daily as needed (ear irritation).    [provider]  JARDIANCE 10 MG TABS tablet Take 10 mg by mouth every morning.    [provider]  Melatonin 5 MG CAPS Take 5 mg by mouth at bedtime.    [provider]  omega-3 acid ethyl esters (LOVAZA ) 1 g capsule Take 4 g by mouth at bedtime. 05/02/20   [provider]  omeprazole (PRILOSEC) 20 MG capsule Take 20 mg by mouth in the morning and at bedtime.    [provider]  Polyethyl Glycol-Propyl Glycol (SYSTANE) 0.4-0.3 % SOLN Place 1-2 drops into both eyes 3 (three) times daily as needed (dry/irritated eyes.).    [provider]  potassium citrate (UROCIT-K) 10 MEQ (1080 MG) SR tablet Take 10 mEq by mouth in the morning. 01/26/23   [provider]  rivaroxaban  (XARELTO ) 10 MG TABS tablet Take 10 mg by mouth in the morning.    [provider]  traMADol  (ULTRAM ) 50 MG tablet Take 50 mg by mouth in the morning and at bedtime.    [provider]  traZODone  (DESYREL ) 100 MG tablet Take 100 mg by mouth at bedtime.    [provider]  vitamin B-12 (CYANOCOBALAMIN) 500 MCG tablet Take 500 mcg by  mouth in the morning.    [provider]  VITAMIN D  PO Take 5,000 Units by mouth in the morning.    [provider]    Allergies: Aspirin, Doxycycline, Amoxicillin, and Chlorhexidine  gluconate    Review of Systems  Respiratory:  Positive for shortness of breath.   Neurological:  Positive for dizziness.  All other systems reviewed and are negative.   Updated Vital Signs BP (!) 148/75 (BP Location: Left Arm)   Pulse (!) 58   Temp 98.8 F (37.1 C) (Oral)   Resp 16   Ht 5' 6 (1.676 m)   Wt 113.4 kg   SpO2 98%   BMI 40.35 kg/m   Physical  Exam Vitals and nursing note reviewed.  Constitutional:      General: He is not in acute distress.    Appearance: He is well-developed.  HENT:     Head: Normocephalic and atraumatic.   Eyes:     Conjunctiva/sclera: Conjunctivae normal.    Cardiovascular:     Rate and Rhythm: Normal rate and regular rhythm.     Heart sounds: No murmur heard. Pulmonary:     Effort: Pulmonary effort is normal. No respiratory distress.     Breath sounds: Normal breath sounds.  Abdominal:     Palpations: Abdomen is soft.     Tenderness: There is no abdominal tenderness.   Musculoskeletal:        General: No swelling.     Cervical back: Neck supple.     Comments: Left sided nephrostomy tube connected to LLE leg bag in place   Skin:    General: Skin is warm and dry.     Capillary Refill: Capillary refill takes less than 2 seconds.   Neurological:     Mental Status: He is alert.     Comments: MENTAL STATUS EXAM:    Orientation: Alert and oriented to person, place and time.  Memory: Cooperative, follows commands well.  Language: Speech is clear and language is normal.   CRANIAL NERVES:    CN 2 (Optic): Visual fields intact to confrontation.  CN 3,4,6 (EOM): Pupils equal and reactive to light. Full extraocular eye movement, nystagmus noted on rightward gaze CN 5 (Trigeminal): Facial sensation is normal, no weakness of masticatory muscles.  CN 7 (Facial): No facial weakness or asymmetry.  CN 8 (Auditory): Auditory acuity grossly normal.  CN 9,10 (Glossophar): The uvula is midline, the palate elevates symmetrically.  CN 11 (spinal access): Normal sternocleidomastoid and trapezius strength.  CN 12 (Hypoglossal): The tongue is midline. No atrophy or fasciculations.SABRA   MOTOR:  Muscle Strength: 5/5RUE, 5/5LUE, 5/5RLE, 4/5LLE.   COORDINATION:   Intact finger-to-nose, no tremor..   SENSATION:   Intact to light touch all four extremities.  GAIT: Gait not asssessed   Psychiatric:        Mood and  Affect: Mood normal.     (all labs ordered are listed, but only abnormal results are displayed) Labs Reviewed  BASIC METABOLIC PANEL WITH GFR - Abnormal; Notable for the following components:      Result Value   Glucose, Bld 116 (*)    BUN 25 (*)    Creatinine, Ser 2.25 (*)    GFR, Estimated 30 (*)    All other components within normal limits  RESP PANEL BY RT-PCR (RSV, FLU A&B, COVID)  RVPGX2  CBC  BRAIN NATRIURETIC PEPTIDE  TROPONIN I (HIGH SENSITIVITY)    EKG: EKG Interpretation Date/Time:  Monday November 30 2023 19:09:05  EDT Ventricular Rate:  80 PR Interval:  196 QRS Duration:  100 QT Interval:  376 QTC Calculation: 433 R Axis:   22  Text Interpretation: Normal sinus rhythm Anterior infarct , age undetermined Abnormal ECG When compared with ECG of 08-May-2023 14:19, PREVIOUS ECG IS PRESENT Confirmed by Jerrol Agent ([id number]) on 11/30/2023 11:29:25 PM  Radiology: CT Angio Chest PE W and/or Wo Contrast Result Date: 12/01/2023 CLINICAL DATA:  73 year old male with dizziness, shortness of breath, syncope. On Eliquis for history of PE. EXAM: CT ANGIOGRAPHY CHEST WITH CONTRAST TECHNIQUE: Multidetector CT imaging of the chest was performed using the standard protocol during bolus administration of intravenous contrast. Multiplanar CT image reconstructions and MIPs were obtained to evaluate the vascular anatomy. RADIATION DOSE REDUCTION: This exam was performed according to the departmental dose-optimization program which includes automated exposure control, adjustment of the mA and/or kV according to patient size and/or use of iterative reconstruction technique. CONTRAST:  75mL OMNIPAQUE  IOHEXOL  350 MG/ML SOLN COMPARISON:  CTA neck today. Chest CTA 11/22/2015 [facility] chest CT without contrast 09/29/2023. FINDINGS: Cardiovascular: Aortic contrast timing. Central, main, proximal lobar pulmonary arteries appear grossly patent. Ascending thoracic aorta measures up to 44 mm diameter as  seen on series 6, image 66, stable since 09/29/2023 and 1-2 mm larger since 2017. Negative for thoracic aortic dissection or saccular aneurysm. Mild for age Calcified aortic atherosclerosis. Incidental 4 vessel aortic arch, CTA neck reported separately today. Lower lung volumes, which accentuates cardiac size. No pericardial effusion. Mediastinum/Nodes: Mediastinal lipomatosis. Negative for mediastinal mass or lymphadenopathy. Lungs/Pleura: Lower lung volumes compared to April. Symmetric bilateral atelectasis. Major airways remain patent. No pleural effusion, consolidation, or convincing lung inflammation. Upper Abdomen: Negative visible mostly noncontrast liver, spleen, adrenal glands and stomach. Musculoskeletal: Cervical and thoracic spine degeneration. No acute or suspicious osseous lesion. Review of the MIP images confirms the above findings. IMPRESSION: 1. Stable ascending aortic fusiform aneurysmal enlargement (4.4 cm). Negative for thoracic aortic dissection or saccular aneurysm. Recommend annual imaging followup by CTA or MRA. This recommendation follows 2010 ACCF/AHA/AATS/ACR/ASA/SCA/SCAI/SIR/STS/SVM Guidelines for the Diagnosis and Management of Patients with Thoracic Aortic Disease. Circulation. 2010; 121: Z733-z630. Aortic aneurysm NOS (ICD10-I71.9). Aortic Atherosclerosis (ICD10-I70.0). 2. Lower lung volumes with atelectasis. Electronically Signed   By: VEAR Hurst M.D.   On: 12/01/2023 05:54   CT ANGIO HEAD NECK W WO CM Result Date: 12/01/2023 CLINICAL DATA:  73 year old male with dizziness, shortness of breath, syncope. On Eliquis for history of PE. EXAM: CT ANGIOGRAPHY HEAD AND NECK TECHNIQUE: Multidetector CT imaging of the head and neck was performed using the standard protocol during bolus administration of intravenous contrast. Multiplanar CT image reconstructions and MIPs were obtained to evaluate the vascular anatomy. Carotid stenosis measurements (when applicable) are obtained utilizing NASCET  criteria, using the distal internal carotid diameter as the denominator. RADIATION DOSE REDUCTION: This exam was performed according to the departmental dose-optimization program which includes automated exposure control, adjustment of the mA and/or kV according to patient size and/or use of iterative reconstruction technique. CONTRAST:  75mL OMNIPAQUE  IOHEXOL  350 MG/ML SOLN COMPARISON:  Head CT today.  CTA chest today reported separately. FINDINGS: CTA NECK Skeleton: Ordinary cervical spine degeneration. No acute osseous abnormality identified. Upper chest: Mediastinal lipomatosis, CTA chest is reported separately today. Other neck: Nonvascular neck soft tissue spaces are within normal limits. Aortic arch: 4 vessel arch, left vertebral artery arises directly from the arch. Mild arch atherosclerosis. Right carotid system: Brachiocephalic artery and proximal right CCA mild tortuosity with no  plaque or stenosis. Soft and calcified plaque of the mid and distal right CCA without stenosis. Partially retropharyngeal course. Mild calcified plaque of the right ICA bulb without stenosis. Tortuous right ICA just below the skull base. Left carotid system: Mild calcified plaque at the left CCA origin without stenosis. Similar mild tortuosity, mild left ICA bulb plaque without stenosis. Vertebral arteries: Tortuous proximal right subclavian artery and normal right vertebral artery origin without stenosis. Dominant right vertebral artery with no plaque or stenosis to the skull base. Non dominant left vertebral artery arises directly from the arch, is diminutive throughout the neck, has a late entry into the cervical transverse foramen on series 6, image 297, is patent to the skull base. CTA HEAD Posterior circulation: Diminutive left vertebral artery does remain patent to the vertebrobasilar junction. Left PICA origin is normal. Dominant right vertebral artery with normal right PICA origin primarily supplies the basilar. Patent  basilar artery with mild tortuosity, no plaque or stenosis. Normal SCA and PCA origins. Bilateral PCA branches are patent with tortuosity, no stenosis. Anterior circulation: Both ICA siphons are patent. Mild right siphon calcified plaque without stenosis. No significant left siphon plaque, no stenosis. Normal right posterior communicating artery is identified on series 9, image 83, left is diminutive or absent. Patent carotid termini. Normal MCA and ACA origins. Anterior communicating artery, bilateral ACA branches are within normal limits. Left MCA M1 segment bifurcates early without stenosis. Right MCA M1 segment and trifurcation are patent without stenosis. Bilateral MCA branches are within normal limits. Venous sinuses: Patent. Anatomic variants: Non dominant left vertebral artery arises directly from the aortic arch. Right vertebral primarily supplies the basilar. Review of the MIP images confirms the above findings IMPRESSION: 1. Negative for large vessel occlusion. Mild for age atherosclerosis in the head and neck. No arterial stenosis. 2. CTA Chest reported separately. Electronically Signed   By: VEAR Hurst M.D.   On: 12/01/2023 05:49   CT HEAD WO CONTRAST ( ) Result Date: 12/01/2023 CLINICAL DATA:  73 year old male with dizziness, shortness of breath, syncope. On Eliquis for history of PE. EXAM: CT HEAD WITHOUT CONTRAST TECHNIQUE: Contiguous axial images were obtained from the base of the skull through the vertex without intravenous contrast. RADIATION DOSE REDUCTION: This exam was performed according to the departmental dose-optimization program which includes automated exposure control, adjustment of the mA and/or kV according to patient size and/or use of iterative reconstruction technique. COMPARISON:  Head CT 01/26/2021. FINDINGS: Brain: Cerebral volume remains within normal limits for age. No midline shift, ventriculomegaly, mass effect, evidence of mass lesion, intracranial hemorrhage or evidence of  cortically based acute infarction. Subtle chronic appearing left caudate lacunar infarcts series 3, image 16. Otherwise gray-white differentiation within normal limits for age. Vascular: Calcified atherosclerosis at the skull base. No suspicious intracranial vascular hyperdensity. Skull: Stable and intact. Sinuses/Orbits: Tympanic cavities, Visualized paranasal sinuses and mastoids are clear. Other: Postoperative changes to both globes since 2022. No acute orbit or scalp soft tissue finding. IMPRESSION: 1. No acute intracranial abnormality. 2. Mild for age chronic small vessel disease. Electronically Signed   By: VEAR Hurst M.D.   On: 12/01/2023 05:43   DG Chest 2 View Result Date: 11/30/2023 CLINICAL DATA:  Shortness of breath. EXAM: CHEST - 2 VIEW COMPARISON:  Chest radiograph 01/26/2021, CT 09/29/2023 FINDINGS: Stable heart size and mediastinal contours. No focal airspace disease. Subsegmental atelectasis at the lung bases. No pleural fluid or pneumothorax. No pulmonary edema. Presumed external clothing artifact projects over the axilla. IMPRESSION:  Subsegmental atelectasis at the lung bases. Electronically Signed   By: Andrea Gasman M.D.   On: 11/30/2023 20:24     Procedures   Medications Ordered in the ED  LORazepam (ATIVAN) tablet 0.5 mg (has no administration in time range)  meclizine (ANTIVERT) tablet 25 mg (25 mg Oral Given 12/01/23 0202)  oxyCODONE -acetaminophen  (PERCOCET/ROXICET) 5-325 MG per tablet 1 tablet (1 tablet Oral Given 12/01/23 0325)  iohexol  (OMNIPAQUE ) 350 MG/ML injection 75 mL (75 mLs Intravenous Contrast Given 12/01/23 0530)                                    Medical Decision Making Amount and/or Complexity of Data Reviewed Labs: ordered. Radiology: ordered.  Risk Prescription drug management.     73 year old male with extensive past medical history to include HLD, nephrolithiasis, GERD, thoracic aortic aneurysm, HTN, DM2, PE on Xarelto , OSA on CPAP, AAA, CKD stage II,  presenting to the emergency department with multiple complaints.  The patient states that his primary complaint is room spinning dizziness that has been present since this past Saturday.  He initially had a intermittently throughout the day on Saturday, noticed worse with positional changes and then has been having fairly constant room spinning dizziness ever since.  He has a history of PE and is on anticoagulation and has not missed any doses.  Initially he states that he has been more short of breath, worse on exertion but still noticing dyspnea at rest.  He has a leg bag in place that is attached to a nephrostomy tube on the left.  He states that he has had worsening swelling in the left lower extremity compared to the right lower extremity.  He endorses heaviness in the left lower extremity as well.  He states that sometimes he is has posterior neck discomfort associated with his vertiginous symptoms.  The patient's vertiginous symptoms have been causing him to lose his balance and have difficulty ambulating over the past few days.  On arrival, the patient was afebrile, vitally stable.  Physical exam revealed nystagmus upon rightward gaze, no dysmetria, slight weakness in the left lower extremity compared to the right lower extremity.  Considered CVA, considered vertebral dissection, considered carotid stenosis, electrolyte abnormality, considered breakthrough PE, pneumothorax, pneumonia, ACS, viral infection, pericardial effusion.  Symptoms could be due to BPPV. Workup initiated to include labs and imaging evaluation.   Labs: Cardiac troponin normal, BNP normal, CBC without a leukocytosis or anemia, BMP with serum creatinine at 2.25 with a GFR of 30, at baseline for the patient's CKD.  Given the patient's presenting symptoms, the benefit of a contrasted CT outweighs the risk of worsening renal function given his GFR of 30.  CXR: IMPRESSION:  Subsegmental atelectasis at the lung bases.    CT  Head: IMPRESSION:  1. No acute intracranial abnormality.  2. Mild for age chronic small vessel disease.   CTA Head and Neck: IMPRESSION:  1. Negative for large vessel occlusion. Mild for age atherosclerosis  in the head and neck. No arterial stenosis.  2. CTA Chest reported separately.   CTA PE: IMPRESSION:  1. Stable ascending aortic fusiform aneurysmal enlargement (4.4 cm).  Negative for thoracic aortic dissection or saccular aneurysm.  Recommend annual imaging followup by CTA or MRA.  This recommendation follows 2010  ACCF/AHA/AATS/ACR/ASA/SCA/SCAI/SIR/STS/SVM Guidelines for the  Diagnosis and Management of Patients with Thoracic Aortic Disease.  Circulation. 2010; 121: [id number]. Aortic aneurysm  NOS  (ICD10-I71.9). Aortic Atherosclerosis (ICD10-I70.0).    2. Lower lung volumes with atelectasis.    MR Brain: pending at time of signout MR Lumbar Spine: pending at time of signout DVT US : Pending at time of signout  Signout given to Dr. Melvenia at 0700, ultimate disposition pending results of testing and reassessment.     Final diagnoses:  Vertigo  SOB (shortness of breath)    ED Discharge Orders     None          Jerrol Agent, MD 12/01/23 [phone]

## 2023-12-01 NOTE — ED Notes (Signed)
 No acute distress. Pt denies any chest pain, shob, or headache at this time. Complains he still has dizziness but has not been up and moving.

## 2023-12-01 NOTE — Discharge Instructions (Addendum)
 Your test results today were overall reassuring.  In your lower back, you have multiple areas of narrowing.  Some of these narrowed areas can impinge on nerve roots and are likely contributing to your left leg weakness and intermittent numbness.  A prescription for a steroid taper was sent to your pharmacy.  This can minimize inflammation and nerve compression.  You should call your spine doctor to arrange close follow-up appointment.  The MRI of your brain did not show any new abnormal findings.  I suspect that your dizziness has an inner ear cause.  A prescription for meclizine was sent to your pharmacy.  Take this as needed for dizziness.  Drink plenty fluids to stay hydrated.  Continue to follow-up with your other outpatient providers.  Return to the emergency department for any new or worsening symptoms of concern.

## 2023-12-01 NOTE — Progress Notes (Signed)
 LLE venous duplex has been completed.  Preliminary results given to Dr. Melvenia.   Results can be found under chart review under CV PROC. 12/01/2023 9:31 AM Tenlee Wollin RVT, RDMS

## 2023-12-01 NOTE — ED Provider Notes (Addendum)
  Discussed the risks and benefits of contrasted CT imaging to further evaluate the patient's presentation. After discussion, the patient has elected to proceed with contrasted CT.        Jerrol Agent, MD 12/01/23 [id number]    Jerrol Agent, MD 12/01/23 [phone]

## 2023-12-01 NOTE — Telephone Encounter (Signed)
 Left message for the patient to call back.

## 2023-12-01 NOTE — ED Notes (Signed)
 ED Provider at bedside.

## 2023-12-01 NOTE — ED Provider Notes (Signed)
 Care of patient assumed from Dr. Jerrol. Recent exertional dyspnea and intermittent vertigo x 1 week. Cts have been negative. Awaiting MRIs. DC is anticipated if remaining studies are reassuring.  Physical Exam  BP (!) 136/90 (BP Location: Left Arm)   Pulse 71   Temp 97.6 F (36.4 C) (Oral)   Resp 18   Ht 5' 6 (1.676 m)   Wt 113.4 kg   SpO2 100%   BMI 40.35 kg/m   Physical Exam Vitals and nursing note reviewed.  Constitutional:      General: He is not in acute distress.    Appearance: He is well-developed. He is not ill-appearing, toxic-appearing or diaphoretic.  HENT:     Head: Normocephalic and atraumatic.     Mouth/Throat:     Mouth: Mucous membranes are moist.   Eyes:     Conjunctiva/sclera: Conjunctivae normal.    Cardiovascular:     Rate and Rhythm: Normal rate and regular rhythm.     Heart sounds: No murmur heard. Pulmonary:     Effort: Pulmonary effort is normal. No tachypnea or respiratory distress.     Breath sounds: Normal breath sounds.  Abdominal:     Palpations: Abdomen is soft.     Tenderness: There is no abdominal tenderness.   Musculoskeletal:        General: No swelling. Normal range of motion.     Cervical back: Normal range of motion and neck supple.   Skin:    General: Skin is warm and dry.     Capillary Refill: Capillary refill takes less than 2 seconds.     Coloration: Skin is not cyanotic or pale.   Neurological:     Mental Status: He is alert and oriented to person, place, and time.     Motor: Weakness (Decreased length in left lower extremity when compared to the right.) present.   Psychiatric:        Mood and Affect: Mood normal.        Behavior: Behavior normal.     Procedures  Procedures  ED Course / MDM    Medical Decision Making Amount and/or Complexity of Data Reviewed Labs: ordered. Radiology: ordered.  Risk Prescription drug management.   On assessment, patient sitting on stretcher.  His dizziness has improved  and he was able to ambulate to the bathroom.  MRI brain was negative for acute findings.  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  Notably, there is severe right neuroforaminal stenosis with disc mildly impinging on exiting L3 nerve.  Patient does endorse left leg weakness over the past month.  On exam, this is present with mildly diminished strength in left leg knee flexion/extension.  He states that he will have intermittent numbness bilaterally in his lower extremities.  His left great toe stays numb.  Because of his left leg weakness, he has required a cane for ambulation.  He underwent lumbar spinal surgery 5 years ago with Dr. Gust.  Patient to be prescribed steroid taper and to follow-up with his spine surgeon.  MRI of lumbar spine also showed dilated proximal right ureter when compared to imaging from a year and a half ago.  He does have a history with kidney stones.  Currently, he has PCN tube on the left.  He states that he will have intermittent discomfort on his right flank.  This does raise concern for another obstructing stone.  CT scan was ordered to further evaluate.  CT stone study did not  show evidence of right-sided obstructive uropathy.  Patient was discharged in stable condition.       Melvenia Motto, MD 12/01/23 1249

## 2023-12-03 NOTE — Patient Instructions (Addendum)
 SURGICAL WAITING ROOM VISITATION Patients having surgery or a procedure may have no more than 2 support people in the waiting area - these visitors may rotate.    Children under the age of 57 must have an adult with them who is not the patient.  If the patient needs to stay at the hospital during part of their recovery, the visitor guidelines for inpatient rooms apply. Pre-op nurse will coordinate an appropriate time for 1 support person to accompany patient in pre-op.  This support person may not rotate.    Please refer to the [facility] website for the visitor guidelines for Inpatients (after your surgery is over and you are in a regular room).       Your procedure is scheduled on: 12-16-23   Report to [facility] Main Entrance    Report to admitting at 6:15 AM   Call this number if you have problems the morning of surgery [phone]   Do not eat food or drink liquids :After Midnight.          If you have questions, please contact your surgeon's office.   FOLLOW ANY ADDITIONAL PRE OP INSTRUCTIONS YOU RECEIVED FROM YOUR SURGEON'S OFFICE!!!     Oral Hygiene is also important to reduce your risk of infection.                                    Remember - BRUSH YOUR TEETH THE MORNING OF SURGERY WITH YOUR REGULAR TOOTHPASTE   Do NOT smoke after Midnight   Take these medicines the morning of surgery with A SIP OF WATER :    Amlodipine    Allegra   Omeprazole   Okay to use Flonase  nasal spray   Meclizine  if needed  Stop all vitamins and herbal supplements 7 days before surgery  Hold Xarelto  3 days before surgery (do not take after 12-12-23)  Bring CPAP mask and tubing day of surgery.  How to Manage Your Diabetes Before and After Surgery  Why is it important to control my blood sugar before and after surgery? Improving blood sugar levels before and after surgery helps healing and can limit problems. A way of improving blood sugar control is eating a healthy diet  by:  Eating less sugar and carbohydrates  Increasing activity/exercise  Talking with your doctor about reaching your blood sugar goals High blood sugars (greater than 180 mg/dL) can raise your risk of infections and slow your recovery, so you will need to focus on controlling your diabetes during the weeks before surgery. Make sure that the doctor who takes care of your diabetes knows about your planned surgery including the date and location.  How do I manage my blood sugar before surgery? Check your blood sugar at least 4 times a day, starting 2 days before surgery, to make sure that the level is not too high or low. Check your blood sugar the morning of your surgery when you wake up and every 2 hours until you get to the Short Stay unit. If your blood sugar is less than 70 mg/dL, you will need to treat for low blood sugar: Do not take insulin . Treat a low blood sugar (less than 70 mg/dL) with  cup of clear juice (cranberry or apple), 4 glucose tablets, OR glucose gel. Recheck blood sugar in 15 minutes after treatment (to make sure it is greater than 70 mg/dL). If your blood sugar is  not greater than 70 mg/dL on recheck, call [phone] for further instructions. Report your blood sugar to the short stay nurse when you get to Short Stay.  If you are admitted to the hospital after surgery: Your blood sugar will be checked by the staff and you will probably be given insulin  after surgery (instead of oral diabetes medicines) to make sure you have good blood sugar levels. The goal for blood sugar control after surgery is 80-180 mg/dL.   WHAT DO I DO ABOUT MY DIABETES MEDICATION?  Do not take oral diabetes medicines (pills) the morning of surgery.  Jardiance - hold 3 days before surgery (do not take after 12-12-23).      DO NOT TAKE THE FOLLOWING 7 DAYS PRIOR TO SURGERY: Ozempic, Wegovy, Rybelsus (Semaglutide), Byetta (exenatide), Bydureon (exenatide ER), Victoza, Saxenda (liraglutide), or  Trulicity (dulaglutide) Mounjaro (Tirzepatide) Adlyxin (Lixisenatide), Polyethylene Glycol Loxenatide.  Reviewed and Endorsed by [facility] Patient Education Committee, August 2015                              You may not have any metal on your body including  jewelry, and body piercing             Do not wear lotions, powders, cologne, or deodorant              Men may shave face and neck.   Do not bring valuables to the hospital. [facility] IS NOT RESPONSIBLE   FOR VALUABLES.   Contacts, dentures or bridgework may not be worn into surgery.   Bring small overnight bag day of surgery.   DO NOT BRING YOUR HOME MEDICATIONS TO THE HOSPITAL. PHARMACY WILL DISPENSE MEDICATIONS LISTED ON YOUR MEDICATION LIST TO YOU DURING YOUR ADMISSION IN THE HOSPITAL!                Please read over the following fact sheets you were given: IF YOU HAVE QUESTIONS ABOUT YOUR PRE-OP INSTRUCTIONS PLEASE CALL [phone] Gwen  If you received a COVID test during your pre-op visit  it is requested that you wear a mask when out in public, stay away from anyone that may not be feeling well and notify your surgeon if you develop symptoms. If you test positive for Covid or have been in contact with anyone that has tested positive in the last 10 days please notify you surgeon.  Kevin - Preparing for Surgery (Use Dial Soap) Before surgery, you can play an important role.  Because skin is not sterile, your skin needs to be as free of germs as possible.  You can reduce the number of germs on your skin by washing with CHG (chlorahexidine gluconate) soap before surgery.  CHG is an antiseptic cleaner which kills germs and bonds with the skin to continue killing germs even after washing. Please DO NOT use if you have an allergy to CHG or antibacterial soaps.  If your skin becomes reddened/irritated stop using the CHG and inform your nurse when you arrive at Short Stay. Do not shave (including legs and underarms) for at  least 48 hours prior to the first CHG shower.  You may shave your face/neck.  Please follow these instructions carefully:  1.  Shower with antibacterial soap the night before surgery and the  morning of surgery.  2.  If you choose to wash your hair, wash your hair first as usual with your normal  shampoo.  3.  After you shampoo, rinse your hair and body thoroughly to remove the shampoo.                             4.  Wash thoroughly, paying special attention to the area where your    surgery  will be performed.  5.  Thoroughly rinse your body with warm water  from the neck down.             6.  Pat yourself dry with a clean towel.             7.  Wear clean pajamas.             8.  Place clean sheets on your bed the night of your first shower and do not  sleep with pets. Day of Surgery : Do not apply any lotions/deodorants the morning of surgery.  Please wear clean clothes to the hospital/surgery center.  FAILURE TO FOLLOW THESE INSTRUCTIONS MAY RESULT IN THE CANCELLATION OF YOUR SURGERY  PATIENT SIGNATURE_________________________________  NURSE SIGNATURE__________________________________  ________________________________________________________________________   WHAT IS A BLOOD TRANSFUSION? Blood Transfusion Information  A transfusion is the replacement of blood or some of its parts. Blood is made up of multiple cells which provide different functions. Red blood cells carry oxygen and are used for blood loss replacement. White blood cells fight against infection. Platelets control bleeding. Plasma helps clot blood. Other blood products are available for specialized needs, such as hemophilia or other clotting disorders. BEFORE THE TRANSFUSION  Who gives blood for transfusions?  Healthy volunteers who are fully evaluated to make sure their blood is safe. This is blood bank blood. Transfusion therapy is the safest it has ever been in the practice of medicine. Before blood is taken from  a donor, a complete history is taken to make sure that person has no history of diseases nor engages in risky social behavior (examples are intravenous drug use or sexual activity with multiple partners). The donor's travel history is screened to minimize risk of transmitting infections, such as malaria. The donated blood is tested for signs of infectious diseases, such as HIV and hepatitis. The blood is then tested to be sure it is compatible with you in order to minimize the chance of a transfusion reaction. If you or a relative donates blood, this is often done in anticipation of surgery and is not appropriate for emergency situations. It takes many days to process the donated blood. RISKS AND COMPLICATIONS Although transfusion therapy is very safe and saves many lives, the main dangers of transfusion include:  Getting an infectious disease. Developing a transfusion reaction. This is an allergic reaction to something in the blood you were given. Every precaution is taken to prevent this. The decision to have a blood transfusion has been considered carefully by your caregiver before blood is given. Blood is not given unless the benefits outweigh the risks. AFTER THE TRANSFUSION Right after receiving a blood transfusion, you will usually feel much better and more energetic. This is especially true if your red blood cells have gotten low (anemic). The transfusion raises the level of the red blood cells which carry oxygen, and this usually causes an energy increase. The nurse administering the transfusion will monitor you carefully for complications. HOME CARE INSTRUCTIONS  No special instructions are needed after a transfusion. You may find your energy is better. Speak with your caregiver about any limitations on activity for underlying  diseases you may have. SEEK MEDICAL CARE IF:  Your condition is not improving after your transfusion. You develop redness or irritation at the intravenous (IV)  site. SEEK IMMEDIATE MEDICAL CARE IF:  Any of the following symptoms occur over the next 12 hours: Shaking chills. You have a temperature by mouth above 102 F (38.9 C), not controlled by medicine. Chest, back, or muscle pain. People around you feel you are not acting correctly or are confused. Shortness of breath or difficulty breathing. Dizziness and fainting. You get a rash or develop hives. You have a decrease in urine output. Your urine turns a dark color or changes to pink, red, or brown. Any of the following symptoms occur over the next 10 days: You have a temperature by mouth above 102 F (38.9 C), not controlled by medicine. Shortness of breath. Weakness after normal activity. The white part of the eye turns yellow (jaundice). You have a decrease in the amount of urine or are urinating less often. Your urine turns a dark color or changes to pink, red, or brown. Document Released: 05/16/2000 Document Revised: 08/11/2011 Document Reviewed: 01/03/2008 [facility] Patient Information 2014 [place], [place].  _______________________________________________________________________

## 2023-12-03 NOTE — Progress Notes (Addendum)
  Date of COVID positive in last 90 days:  No  PCP - Dene Livings, DO Cardiologist - Redell Leiter, MD  Chest x-ray - 11-30-23 Epic EKG - 12-01-23 Epic Stress Test - 05-18-18 Epic ECHO - 11-05-23 Epic Cardiac Cath - Yes, several years  Pacemaker/ICD device last checked:  N/A Spinal Cord Stimulator:  N/A  Bowel Prep - N/A  Sleep Study - Yes, +sleep apnea CPAP - Yes  Fasting Blood Sugar - 130 range Checks Blood Sugar - once or twice a week   Last dose of GLP1 agonist-  N/A GLP1 instructions:  Do not take after     Jardiance Last dose of SGLT-2 inhibitors-  N/A SGLT-2 instructions:  Do not take after  12-12-23  Blood Thinner Instructions:  Xarelto  per pt to hold 3 days prior to surgery  Time: Aspirin Instructions: Last Dose:  Activity level:  Can go up a flight of stairs and perform activities of daily living without stopping and without symptoms of chest pain or shortness of breath.    Anesthesia review:  AAA, aortic atherosclerosis, CKD, HTN, DM  Creatinine 2.25 on recent labs  Patient denies shortness of breath, fever, cough and chest pain at PAT appointment  Patient verbalized understanding of instructions that were given to them at the PAT appointment. Patient was also instructed that they will need to review over the PAT instructions again at home before surgery.

## 2023-12-10 ENCOUNTER — Encounter (HOSPITAL_COMMUNITY)
Admission: RE | Admit: 2023-12-10 | Discharge: 2023-12-10 | Disposition: A | Discharge status: Home / Self Care | Source: Ambulatory Visit | Attending: Urology | Admitting: Urology

## 2023-12-10 ENCOUNTER — Other Ambulatory Visit: Payer: Self-pay

## 2023-12-10 ENCOUNTER — Encounter (HOSPITAL_COMMUNITY): Payer: Self-pay

## 2023-12-10 VITALS — BP 147/84 | HR 62 | Temp 98.3°F | Resp 20 | Ht 66.0 in | Wt 255.8 lb

## 2023-12-10 DIAGNOSIS — Z7901 Long term (current) use of anticoagulants: Secondary | ICD-10-CM | POA: Insufficient documentation

## 2023-12-10 DIAGNOSIS — K429 Umbilical hernia without obstruction or gangrene: Secondary | ICD-10-CM | POA: Insufficient documentation

## 2023-12-10 DIAGNOSIS — Z86718 Personal history of other venous thrombosis and embolism: Secondary | ICD-10-CM | POA: Insufficient documentation

## 2023-12-10 DIAGNOSIS — I129 Hypertensive chronic kidney disease with stage 1 through stage 4 chronic kidney disease, or unspecified chronic kidney disease: Secondary | ICD-10-CM | POA: Insufficient documentation

## 2023-12-10 DIAGNOSIS — K219 Gastro-esophageal reflux disease without esophagitis: Secondary | ICD-10-CM | POA: Diagnosis not present

## 2023-12-10 DIAGNOSIS — Z86711 Personal history of pulmonary embolism: Secondary | ICD-10-CM | POA: Insufficient documentation

## 2023-12-10 DIAGNOSIS — E1122 Type 2 diabetes mellitus with diabetic chronic kidney disease: Secondary | ICD-10-CM | POA: Diagnosis not present

## 2023-12-10 DIAGNOSIS — G4733 Obstructive sleep apnea (adult) (pediatric): Secondary | ICD-10-CM | POA: Insufficient documentation

## 2023-12-10 DIAGNOSIS — N182 Chronic kidney disease, stage 2 (mild): Secondary | ICD-10-CM | POA: Insufficient documentation

## 2023-12-10 DIAGNOSIS — N135 Crossing vessel and stricture of ureter without hydronephrosis: Secondary | ICD-10-CM | POA: Insufficient documentation

## 2023-12-10 DIAGNOSIS — Z87891 Personal history of nicotine dependence: Secondary | ICD-10-CM | POA: Insufficient documentation

## 2023-12-10 DIAGNOSIS — Z7984 Long term (current) use of oral hypoglycemic drugs: Secondary | ICD-10-CM | POA: Insufficient documentation

## 2023-12-10 DIAGNOSIS — Z8546 Personal history of malignant neoplasm of prostate: Secondary | ICD-10-CM | POA: Insufficient documentation

## 2023-12-10 DIAGNOSIS — I716 Thoracoabdominal aortic aneurysm, without rupture, unspecified: Secondary | ICD-10-CM | POA: Diagnosis not present

## 2023-12-10 DIAGNOSIS — E119 Type 2 diabetes mellitus without complications: Secondary | ICD-10-CM

## 2023-12-10 DIAGNOSIS — Z01812 Encounter for preprocedural laboratory examination: Secondary | ICD-10-CM | POA: Insufficient documentation

## 2023-12-10 LAB — HEMOGLOBIN A1C
Hgb A1c MFr Bld: 6.5 % — ABNORMAL HIGH (ref 4.8–5.6)
Mean Plasma Glucose: 140 mg/dL

## 2023-12-10 LAB — GLUCOSE, CAPILLARY: Glucose-Capillary: 218 mg/dL — ABNORMAL HIGH (ref 70–99)

## 2023-12-11 ENCOUNTER — Other Ambulatory Visit: Payer: Self-pay

## 2023-12-11 MED ORDER — AMLODIPINE BESYLATE 10 MG PO TABS: 10.0000 mg | ORAL_TABLET | Freq: Every day | ORAL | 3 refills | Status: AC

## 2023-12-11 NOTE — Telephone Encounter (Signed)
 Called the patient and informed him of Dr. Karry recommendation below:  Increase amlodipine  to 10 mg daily. Warned him about potential side effects which is swelling of lower extremities with that  Patient verbalized understanding and had no further questions at this time. Amlodipine  medication ordered via Epic and sent to the patient's pharmacy.

## 2023-12-11 NOTE — Progress Notes (Signed)
 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 Ricardo Alvaro.   Prior complication of anesthesia includes bradycardia during lithotripsy. S/p diagnostic ureteroscopy 07/24/2023 with no anesthesia complications.   He follows with [facility] for his CKD.  Creatinine is stable on labs.   Patient follows with cardiology for surveillance of a TAA.  CT chest in August 2024 showed TAA was measuring 4.1 cm which is stable from prior.  Last seen on 07/09/2022 for preop clearance.  Patient denied any symptoms.  Specifically no shortness of breath nor dyspnea on exertion. Reports no chest pain, pressure, or tightness. No edema, orthopnea, PND. Reports no palpitations.  He was cleared for surgery:   Preop Clearance   Mr. Weightman perioperative risk of a major cardiac event is 6.6% according to the Revised Cardiac Risk Index (RCRI).  Therefore, he is at high risk for perioperative complications.   His functional capacity is good at 5.29 METs according to the [facility] Activity Status Index (DASI). Recommendations: According to ACC/AHA guidelines, no further cardiovascular testing needed.  The patient may proceed to surgery at acceptable risk.   Antiplatelet and/or Anticoagulation Recommendations:   Xarelto  (Rivaroxaban ) can be held for 2-3 days prior  to surgery.  Please resume post op when felt to be safe    Patient follows with pulmonology for OSA.  He uses his CPAP. VS: BP (!) 147/84   Pulse 62   Temp 36.8 C (Oral)   Resp 20   Ht 5' 6 (1.676 m)   Wt 116 kg   SpO2 97%   BMI 41.29 kg/m   PROVIDERS: Conley Dene BROCKS, DO is PCP   Cardiologist - Redell Leiter, MD  LABS: Labs reviewed: Acceptable for surgery. (all labs ordered are listed, but only abnormal results are displayed)  Labs Reviewed  HEMOGLOBIN A1C - Abnormal; Notable for the following components:      Result Value   Hgb A1c MFr Bld 6.5 (*)    All other components within normal limits  GLUCOSE, CAPILLARY - Abnormal; Notable for the following components:   Glucose-Capillary 218 (*)    All other components within normal limits  TYPE AND SCREEN     IMAGES:   EKG:   CV: Echo 11/05/2023 1. Left ventricular ejection fraction, by estimation, is 60 to 65%. The  left ventricle has normal function. The left ventricle has no regional  wall motion abnormalities. Left ventricular diastolic parameters are  consistent with Grade I diastolic  dysfunction (impaired relaxation). The average left ventricular global  longitudinal strain is -10.4 %. The global longitudinal strain is  abnormal.   2. Right ventricular systolic function is normal. The right ventricular  size is normal. There is normal pulmonary artery systolic pressure.   3. The mitral valve is normal in  structure. No evidence of mitral valve  regurgitation. No evidence of mitral stenosis.   4. The aortic valve is normal in structure. Aortic valve regurgitation is  mild. No aortic stenosis is present.   5. Aneurysm of the ascending aorta, measuring 43 mm.   6. The inferior vena cava is normal in size with greater than 50%  respiratory variability, suggesting right atrial pressure of 3 mmHg.   Myocardial Perfusion 05/18/2018 The left ventricular ejection fraction is normal (55-65%). Nuclear stress EF:  55%. There was no ST segment deviation noted during stress. The study is normal. This is a low risk study.    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   Complication of anesthesia    low heart rate during lithotripsy procedure   Deviated septum 07/19/2019   Diabetes (HCC) 03/16/2017   Essential hypertension 10/14/2015   Family history of adverse reaction to anesthesia    sister coded after surgery   GERD (gastroesophageal reflux disease)    Heart murmur    Hematuria    Hemorrhoid    History of kidney stones    Hyperlipidemia    Impingement of left ankle joint 03/06/2021   Kidney stone 03/16/2017   Lumbar radiculopathy 03/16/2017   Nasal turbinate hypertrophy 07/19/2019   OSA on CPAP    Paronychia 08/16/2015   PE (pulmonary thromboembolism) (HCC) 02/2019   Posterior tibialis tendinitis of both lower extremities 02/02/2020   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   CARPAL TUNNEL RELEASE Left 06/2016   CARPAL TUNNEL RELEASE Right 03/2017   CATARACT EXTRACTION, BILATERAL  Bilateral 2005   COLONOSCOPY     last with medoff unsure when    CYSTOSCOPY WITH RETROGRADE PYELOGRAM, URETEROSCOPY AND STENT PLACEMENT Left 04/09/2022   Procedure: CYSTOSCOPY WITH RETROGRADE PYELOGRAM AND STENT PLACEMENT;  Surgeon: Carolee Sherwood JONETTA DOUGLAS, MD;  Location[facility];  Service: Urology;  Laterality: Left;   CYSTOSCOPY WITH RETROGRADE PYELOGRAM, URETEROSCOPY AND STENT PLACEMENT Left 05/04/2022   Procedure: CYSTOSCOPY WITH RETROGRADE PYELOGRAM, URETEROSCOPY AND STENT PLACEMENT;  Surgeon: Devere Lonni Righter, MD;  Location[facility];  Service: Urology;  Laterality: Left;   CYSTOSCOPY WITH URETEROSCOPY AND STENT PLACEMENT Right 10/28/2013   Procedure: RIGHT URETEROSCOPY AND STENT PLACEMENT;  Surgeon: Mark C Ottelin, MD;  Location[facility];  Service: Urology;  Laterality: Right;   CYSTOSCOPY/URETEROSCOPY/HOLMIUM LASER/STENT PLACEMENT Left 09/06/2020   Procedure: CYSTOSCOPY/POSSIBLE URETEROSCOPY/HOLMIUM LASER/STENT PLACEMENT;  Surgeon: Rosalind Zachary NOVAK, MD;  [facility];  Service: Urology;  Laterality: Left;  30 MINS   CYSTOSCOPY/URETEROSCOPY/HOLMIUM LASER/STENT PLACEMENT Left 04/22/2022   Procedure: CYSTOSCOPY/URETEROSCOPY/HOLMIUM LASER/STENT REPLACEMENT;  Surgeon: Rosalind Zachary NOVAK, MD;  [facility];  Service: Urology;  Laterality: Left;  1 HR   CYSTOSCOPY/URETEROSCOPY/HOLMIUM LASER/STENT PLACEMENT Left 05/20/2022   Procedure: CYSTOSCOPY/URETEROSCOPY/HOLMIUM LASER/STENT EXCHANGE;  Surgeon: Rosalind Zachary NOVAK, MD;  [facility];  Service: Urology;  Laterality: Left;   CYSTOSCOPY/URETEROSCOPY/HOLMIUM LASER/STENT PLACEMENT Left 07/24/2023   Procedure: DIAGNOSTIC URETEROSCOPY;  Surgeon: Selma Donnice SAUNDERS, MD;  [facility];  Service: Urology;  Laterality: Left;  60 MINUTES NEEDED FOR CASE   EXTRACORPOREAL SHOCK WAVE LITHOTRIPSY  02/08/2009   HOLMIUM LASER APPLICATION Right 10/28/2013   Procedure: HOLMIUM LASER LITHO;  Surgeon: Oneil JAYSON Rafter, MD;  Location:  [facility];  Service: Urology;  Laterality: Right;   IR CONVERT LEFT NEPHROSTOMY TO NEPHROURETERAL CATH  09/08/2023   IR NEPHROSTOGRAM LEFT THRU EXISTING ACCESS  09/01/2023   IR NEPHROSTOMY EXCHANGE LEFT  09/08/2023   IR NEPHROSTOMY EXCHANGE LEFT  11/03/2023   IR NEPHROSTOMY PLACEMENT LEFT  08/26/2023   LEFT KNEE SURG.  1993  &  NOV 2011   LEFT URETEROSCOPIC STONE EXTRACTION  02/23/2009   POLYPECTOMY  2014   1 polyp per dr ira notes- no surg path to determine polyps type but was a 5 yr recall    RADIOACTIVE SEED IMPLANT  08/08/2011   Procedure: RADIOACTIVE SEED IMPLANT;  Surgeon: Oneil JAYSON Rafter, MD;  Location: [facility];  Service: Urology;  Laterality: N/A;  seeds implanted 50 seeds found in bladder=none    REPLACEMENT TOTAL KNEE Left 2018   SKIN CANCER EXCISION     Burned off left side of nose, and head   TOTAL KNEE ARTHROPLASTY Right 08/2018   UPPER GASTROINTESTINAL ENDOSCOPY     URETEROSCOPY  03/15/2012   Procedure: URETEROSCOPY;  Surgeon: Oneil JAYSON Rafter, MD;  Location: [facility];  Service: Urology;  Laterality: Right;  Right Ueteroscopy     MEDICATIONS:  acetaminophen  (TYLENOL ) 500 MG tablet   amLODipine  (NORVASC ) 5 MG tablet   atorvastatin  (LIPITOR) 10 MG tablet   fexofenadine (ALLEGRA) 180 MG tablet   fluticasone  (FLONASE ) 50 MCG/ACT nasal spray   hydrocortisone 2.5 % cream   JARDIANCE 10 MG TABS tablet   meclizine  (ANTIVERT ) 25 MG tablet   Melatonin 5 MG CAPS   omega-3 acid ethyl esters (LOVAZA ) 1 g capsule   omeprazole (PRILOSEC) 20 MG capsule   Polyethyl Glycol-Propyl Glycol (SYSTANE) 0.4-0.3 % SOLN   potassium citrate (UROCIT-K) 10 MEQ (1080 MG) SR tablet   predniSONE  (STERAPRED UNI-PAK 21 TAB) 10 MG (21) TBPK tablet   rivaroxaban  (XARELTO ) 10 MG TABS tablet   traMADol  (ULTRAM ) 50 MG tablet   traZODone  (DESYREL ) 100 MG tablet   vitamin B-12 (CYANOCOBALAMIN) 500 MCG tablet   VITAMIN D  PO   No current  facility-administered medications for this encounter.     Harlene Hoots Ward, PA-C [facility] [phone]

## 2023-12-14 ENCOUNTER — Ambulatory Visit (HOSPITAL_COMMUNITY)
Admission: RE | Admit: 2023-12-14 | Discharge: 2023-12-14 | Disposition: A | Discharge status: Home / Self Care | Source: Ambulatory Visit | Attending: Interventional Radiology | Admitting: Interventional Radiology

## 2023-12-14 ENCOUNTER — Other Ambulatory Visit (HOSPITAL_COMMUNITY): Payer: Self-pay | Admitting: Interventional Radiology

## 2023-12-14 DIAGNOSIS — M19042 Primary osteoarthritis, left hand: Secondary | ICD-10-CM | POA: Diagnosis not present

## 2023-12-14 DIAGNOSIS — Z7984 Long term (current) use of oral hypoglycemic drugs: Secondary | ICD-10-CM | POA: Diagnosis not present

## 2023-12-14 DIAGNOSIS — N1339 Other hydronephrosis: Secondary | ICD-10-CM

## 2023-12-14 DIAGNOSIS — K429 Umbilical hernia without obstruction or gangrene: Secondary | ICD-10-CM | POA: Diagnosis not present

## 2023-12-14 DIAGNOSIS — Z923 Personal history of irradiation: Secondary | ICD-10-CM | POA: Diagnosis not present

## 2023-12-14 DIAGNOSIS — T83092A Other mechanical complication of nephrostomy catheter, initial encounter: Secondary | ICD-10-CM | POA: Diagnosis not present

## 2023-12-14 DIAGNOSIS — I129 Hypertensive chronic kidney disease with stage 1 through stage 4 chronic kidney disease, or unspecified chronic kidney disease: Secondary | ICD-10-CM | POA: Diagnosis not present

## 2023-12-14 DIAGNOSIS — N182 Chronic kidney disease, stage 2 (mild): Secondary | ICD-10-CM | POA: Diagnosis not present

## 2023-12-14 DIAGNOSIS — M19041 Primary osteoarthritis, right hand: Secondary | ICD-10-CM | POA: Diagnosis not present

## 2023-12-14 DIAGNOSIS — K660 Peritoneal adhesions (postprocedural) (postinfection): Secondary | ICD-10-CM | POA: Diagnosis not present

## 2023-12-14 DIAGNOSIS — E1122 Type 2 diabetes mellitus with diabetic chronic kidney disease: Secondary | ICD-10-CM | POA: Diagnosis not present

## 2023-12-14 DIAGNOSIS — H9193 Unspecified hearing loss, bilateral: Secondary | ICD-10-CM | POA: Diagnosis not present

## 2023-12-14 DIAGNOSIS — G4733 Obstructive sleep apnea (adult) (pediatric): Secondary | ICD-10-CM | POA: Diagnosis not present

## 2023-12-14 DIAGNOSIS — Z85828 Personal history of other malignant neoplasm of skin: Secondary | ICD-10-CM | POA: Diagnosis not present

## 2023-12-14 DIAGNOSIS — Z96653 Presence of artificial knee joint, bilateral: Secondary | ICD-10-CM | POA: Diagnosis not present

## 2023-12-14 DIAGNOSIS — Z7901 Long term (current) use of anticoagulants: Secondary | ICD-10-CM | POA: Diagnosis not present

## 2023-12-14 DIAGNOSIS — E785 Hyperlipidemia, unspecified: Secondary | ICD-10-CM | POA: Diagnosis not present

## 2023-12-14 DIAGNOSIS — Z86718 Personal history of other venous thrombosis and embolism: Secondary | ICD-10-CM | POA: Diagnosis not present

## 2023-12-14 DIAGNOSIS — N135 Crossing vessel and stricture of ureter without hydronephrosis: Secondary | ICD-10-CM | POA: Diagnosis not present

## 2023-12-14 DIAGNOSIS — Z86711 Personal history of pulmonary embolism: Secondary | ICD-10-CM | POA: Diagnosis not present

## 2023-12-14 DIAGNOSIS — K219 Gastro-esophageal reflux disease without esophagitis: Secondary | ICD-10-CM | POA: Diagnosis not present

## 2023-12-14 DIAGNOSIS — R8271 Bacteriuria: Secondary | ICD-10-CM | POA: Diagnosis not present

## 2023-12-14 DIAGNOSIS — Z8249 Family history of ischemic heart disease and other diseases of the circulatory system: Secondary | ICD-10-CM | POA: Diagnosis not present

## 2023-12-14 MED ORDER — IOHEXOL 300 MG/ML  SOLN
50.0000 mL | Freq: Once | INTRAMUSCULAR | Status: AC | PRN
  Administered 2023-12-14: 15 mL via INTRA_ARTERIAL

## 2023-12-14 MED ORDER — LIDOCAINE HCL 1 % IJ SOLN
INTRAMUSCULAR | Status: AC
  Filled 2023-12-14: qty 20

## 2023-12-14 MED ORDER — LIDOCAINE HCL URETHRAL/MUCOSAL 2 % EX GEL: 1.0000 | Freq: Once | CUTANEOUS | Status: DC

## 2023-12-14 MED ORDER — LIDOCAINE VISCOUS HCL 2 % MT SOLN
OROMUCOSAL | Status: AC
Start: 2023-12-14 — End: 2023-12-14
  Filled 2023-12-14: qty 15

## 2023-12-14 MED ORDER — LIDOCAINE HCL 1 % IJ SOLN
20.0000 mL | Freq: Once | INTRAMUSCULAR | Status: AC
  Administered 2023-12-14: 3 mL via INTRADERMAL

## 2023-12-14 NOTE — Procedures (Signed)
 Interventional Radiology Procedure Note  Procedure: fluoro 57fr pcn replacement    Complications: None  Estimated Blood Loss:  min  Findings: Full report in pacs     EMERSON FREDERIC SPECKING, MD

## 2023-12-15 NOTE — Anesthesia Preprocedure Evaluation (Signed)
 Anesthesia Evaluation  Patient identified by MRN, date of birth, ID band Patient awake    Reviewed: Allergy & Precautions, NPO status , Patient's Chart, lab work & pertinent test results  Airway Mallampati: III  TM Distance: >3 FB Neck ROM: Full    Dental no notable dental hx. (+) Caps, Teeth Intact, Dental Advisory Given, Implants,    Pulmonary sleep apnea and Continuous Positive Airway Pressure Ventilation , former smoker, PE   Pulmonary exam normal breath sounds clear to auscultation       Cardiovascular hypertension, Pt. on medications (-) angina + Peripheral Vascular Disease (TAA 4.1cm on Ct 01/2023) and + DVT (2020 on Xarelto )  (-) Past MI Normal cardiovascular exam Rhythm:Regular Rate:Normal  11/05/2023 Echo  1. Left ventricular ejection fraction, by estimation, is 60 to 65%. The  left ventricle has normal function. The left ventricle has no regional  wall motion abnormalities. Left ventricular diastolic parameters are  consistent with Grade I diastolic  dysfunction (impaired relaxation). The average left ventricular global  longitudinal strain is -10.4 %. The global longitudinal strain is  abnormal.   2. Right ventricular systolic function is normal. The right ventricular  size is normal. There is normal pulmonary artery systolic pressure.   3. The mitral valve is normal in structure. No evidence of mitral valve  regurgitation. No evidence of mitral stenosis.   4. The aortic valve is normal in structure. Aortic valve regurgitation is  mild. No aortic stenosis is present.   5. Aneurysm of the ascending aorta, measuring 43 mm.   6. The inferior vena cava is normal in size with greater than 50%  respiratory variability, suggesting right atrial pressure of 3 mmHg.     Neuro/Psych    GI/Hepatic ,GERD  ,,  Endo/Other  diabetes, Type 2  Class 3 obesity  Renal/GU CRFRenal diseaseStage 2 Lab Results      Component                 Value               Date                          K                        4.1                 11/30/2023                CO2                      23                  11/30/2023                BUN                      25 (H)              11/30/2023                CREATININE               2.25 (H)            11/30/2023                GFRNONAA  30 (L)              11/30/2023                  GLUCOSE                  116 (H)             11/30/2023              Prostate CA    Musculoskeletal  (+) Arthritis ,    Abdominal   Peds  Hematology Lab Results      Component                Value               Date                       HCT                      41.9                11/30/2023                 PLT                      178                 11/30/2023              Anesthesia Other Findings All: ASA, Doxycycline,Amoxicillin, Chlorhexidine   Reproductive/Obstetrics                              Anesthesia Physical Anesthesia Plan  ASA: 3  Anesthesia Plan: General   Post-op Pain Management: Ofirmev  IV (intra-op)*, Lidocaine  infusion* and Ketamine  IV*   Induction: Intravenous  PONV Risk Score and Plan: 3 and Treatment may vary due to age or medical condition, Ondansetron  and Dexamethasone   Airway Management Planned: Oral ETT  Additional Equipment: None  Intra-op Plan:   Post-operative Plan: Extubation in OR  Informed Consent: I have reviewed the patients History and Physical, chart, labs and discussed the procedure including the risks, benefits and alternatives for the proposed anesthesia with the patient or authorized representative who has indicated his/her understanding and acceptance.     Dental advisory given  Plan Discussed with: CRNA and Surgeon  Anesthesia Plan Comments:          Anesthesia Quick Evaluation

## 2023-12-16 ENCOUNTER — Encounter (HOSPITAL_COMMUNITY): Payer: Self-pay | Admitting: Urology

## 2023-12-16 ENCOUNTER — Other Ambulatory Visit: Payer: Self-pay

## 2023-12-16 ENCOUNTER — Inpatient Hospital Stay (HOSPITAL_COMMUNITY): Admitting: Anesthesiology

## 2023-12-16 ENCOUNTER — Inpatient Hospital Stay (HOSPITAL_COMMUNITY)
Admission: RE | Admit: 2023-12-16 | Discharge: 2023-12-17 | DRG: 654 | Disposition: A | Discharge status: Home / Self Care | Attending: Urology | Admitting: Urology

## 2023-12-16 ENCOUNTER — Inpatient Hospital Stay (HOSPITAL_COMMUNITY): Admitting: Physician Assistant

## 2023-12-16 ENCOUNTER — Encounter (HOSPITAL_COMMUNITY)
Admission: RE | Disposition: A | Discharge status: Home / Self Care | Payer: Self-pay | Source: Home / Self Care | Attending: Urology

## 2023-12-16 DIAGNOSIS — M19042 Primary osteoarthritis, left hand: Secondary | ICD-10-CM | POA: Diagnosis present

## 2023-12-16 DIAGNOSIS — E785 Hyperlipidemia, unspecified: Secondary | ICD-10-CM | POA: Diagnosis present

## 2023-12-16 DIAGNOSIS — N2889 Other specified disorders of kidney and ureter: Secondary | ICD-10-CM | POA: Diagnosis not present

## 2023-12-16 DIAGNOSIS — E1122 Type 2 diabetes mellitus with diabetic chronic kidney disease: Secondary | ICD-10-CM | POA: Diagnosis not present

## 2023-12-16 DIAGNOSIS — Z881 Allergy status to other antibiotic agents status: Secondary | ICD-10-CM

## 2023-12-16 DIAGNOSIS — Z96653 Presence of artificial knee joint, bilateral: Secondary | ICD-10-CM | POA: Diagnosis present

## 2023-12-16 DIAGNOSIS — E6689 Other obesity not elsewhere classified: Secondary | ICD-10-CM | POA: Diagnosis present

## 2023-12-16 DIAGNOSIS — Z888 Allergy status to other drugs, medicaments and biological substances status: Secondary | ICD-10-CM

## 2023-12-16 DIAGNOSIS — Z8249 Family history of ischemic heart disease and other diseases of the circulatory system: Secondary | ICD-10-CM | POA: Diagnosis not present

## 2023-12-16 DIAGNOSIS — N135 Crossing vessel and stricture of ureter without hydronephrosis: Secondary | ICD-10-CM

## 2023-12-16 DIAGNOSIS — Z7901 Long term (current) use of anticoagulants: Secondary | ICD-10-CM

## 2023-12-16 DIAGNOSIS — Z9842 Cataract extraction status, left eye: Secondary | ICD-10-CM

## 2023-12-16 DIAGNOSIS — Z85828 Personal history of other malignant neoplasm of skin: Secondary | ICD-10-CM

## 2023-12-16 DIAGNOSIS — K219 Gastro-esophageal reflux disease without esophagitis: Secondary | ICD-10-CM | POA: Diagnosis present

## 2023-12-16 DIAGNOSIS — K429 Umbilical hernia without obstruction or gangrene: Secondary | ICD-10-CM

## 2023-12-16 DIAGNOSIS — Z79899 Other long term (current) drug therapy: Secondary | ICD-10-CM

## 2023-12-16 DIAGNOSIS — Z86718 Personal history of other venous thrombosis and embolism: Secondary | ICD-10-CM | POA: Diagnosis not present

## 2023-12-16 DIAGNOSIS — N182 Chronic kidney disease, stage 2 (mild): Secondary | ICD-10-CM | POA: Diagnosis present

## 2023-12-16 DIAGNOSIS — H9193 Unspecified hearing loss, bilateral: Secondary | ICD-10-CM | POA: Diagnosis present

## 2023-12-16 DIAGNOSIS — Z86711 Personal history of pulmonary embolism: Secondary | ICD-10-CM | POA: Diagnosis not present

## 2023-12-16 DIAGNOSIS — Z8546 Personal history of malignant neoplasm of prostate: Secondary | ICD-10-CM | POA: Diagnosis not present

## 2023-12-16 DIAGNOSIS — Z880 Allergy status to penicillin: Secondary | ICD-10-CM

## 2023-12-16 DIAGNOSIS — M19041 Primary osteoarthritis, right hand: Secondary | ICD-10-CM | POA: Diagnosis present

## 2023-12-16 DIAGNOSIS — K660 Peritoneal adhesions (postprocedural) (postinfection): Secondary | ICD-10-CM | POA: Diagnosis present

## 2023-12-16 DIAGNOSIS — Z923 Personal history of irradiation: Secondary | ICD-10-CM | POA: Diagnosis not present

## 2023-12-16 DIAGNOSIS — R8271 Bacteriuria: Secondary | ICD-10-CM | POA: Diagnosis present

## 2023-12-16 DIAGNOSIS — I129 Hypertensive chronic kidney disease with stage 1 through stage 4 chronic kidney disease, or unspecified chronic kidney disease: Secondary | ICD-10-CM

## 2023-12-16 DIAGNOSIS — Z833 Family history of diabetes mellitus: Secondary | ICD-10-CM

## 2023-12-16 DIAGNOSIS — Z87442 Personal history of urinary calculi: Secondary | ICD-10-CM

## 2023-12-16 DIAGNOSIS — Z7984 Long term (current) use of oral hypoglycemic drugs: Secondary | ICD-10-CM | POA: Diagnosis not present

## 2023-12-16 DIAGNOSIS — Z6841 Body Mass Index (BMI) 40.0 and over, adult: Secondary | ICD-10-CM | POA: Diagnosis not present

## 2023-12-16 DIAGNOSIS — Z9841 Cataract extraction status, right eye: Secondary | ICD-10-CM

## 2023-12-16 DIAGNOSIS — G4733 Obstructive sleep apnea (adult) (pediatric): Secondary | ICD-10-CM | POA: Diagnosis present

## 2023-12-16 DIAGNOSIS — Z886 Allergy status to analgesic agent status: Secondary | ICD-10-CM

## 2023-12-16 DIAGNOSIS — E119 Type 2 diabetes mellitus without complications: Secondary | ICD-10-CM

## 2023-12-16 LAB — GLUCOSE, CAPILLARY
Glucose-Capillary: 135 mg/dL — ABNORMAL HIGH (ref 70–99)
Glucose-Capillary: 170 mg/dL — ABNORMAL HIGH (ref 70–99)
Glucose-Capillary: 166 mg/dL — ABNORMAL HIGH (ref 70–99)
Glucose-Capillary: 201 mg/dL — ABNORMAL HIGH (ref 70–99)

## 2023-12-16 LAB — CBC
HCT: 41.9 % (ref 39.0–52.0)
Hemoglobin: 13.8 g/dL (ref 13.0–17.0)
MCH: 28.9 pg (ref 26.0–34.0)
MCHC: 32.9 g/dL (ref 30.0–36.0)
MCV: 87.7 fL (ref 80.0–100.0)
Platelets: 131 K/uL — ABNORMAL LOW (ref 150–400)
RBC: 4.78 MIL/uL (ref 4.22–5.81)
RDW: 15.3 % (ref 11.5–15.5)
WBC: 10.2 K/uL (ref 4.0–10.5)
nRBC: 0 % (ref 0.0–0.2)

## 2023-12-16 LAB — TYPE AND SCREEN
ABO/RH(D): O NEG
Antibody Screen: NEGATIVE

## 2023-12-16 LAB — HEMOGLOBIN AND HEMATOCRIT, BLOOD
HCT: 41.1 % (ref 39.0–52.0)
Hemoglobin: 13.2 g/dL (ref 13.0–17.0)

## 2023-12-16 LAB — CREATININE, SERUM
Creatinine, Ser: 1.95 mg/dL — ABNORMAL HIGH (ref 0.61–1.24)
GFR, Estimated: 36 mL/min — ABNORMAL LOW (ref >60)

## 2023-12-16 LAB — ABO/RH: ABO/RH(D): O NEG

## 2023-12-16 SURGERY — REIMPLANTATION, URETER, ROBOT-ASSISTED, LAPAROSCOPIC
Anesthesia: General | Site: Abdomen

## 2023-12-16 MED ORDER — OXYCODONE HCL 5 MG/5ML PO SOLN: 5.0000 mg | Freq: Once | ORAL | Status: DC | PRN

## 2023-12-16 MED ORDER — PHENYLEPHRINE HCL-NACL 20-0.9 MG/250ML-% IV SOLN
INTRAVENOUS | Status: AC
  Filled 2023-12-16: qty 250

## 2023-12-16 MED ORDER — SODIUM CHLORIDE 0.9 % IV SOLN: INTRAVENOUS | Status: DC

## 2023-12-16 MED ORDER — LIDOCAINE HCL (PF) 2 % IJ SOLN
INTRAMUSCULAR | Status: AC
  Filled 2023-12-16: qty 15

## 2023-12-16 MED ORDER — SODIUM CHLORIDE (PF) 0.9 % IJ SOLN
INTRAMUSCULAR | Status: DC | PRN
  Administered 2023-12-16: 40 mL

## 2023-12-16 MED ORDER — TRAZODONE HCL 100 MG PO TABS
100.0000 mg | ORAL_TABLET | Freq: Every day | ORAL | Status: DC
  Administered 2023-12-16: 100 mg via ORAL
  Filled 2023-12-16: qty 1

## 2023-12-16 MED ORDER — PROPOFOL 10 MG/ML IV BOLUS
INTRAVENOUS | Status: AC
  Filled 2023-12-16: qty 20

## 2023-12-16 MED ORDER — ATORVASTATIN CALCIUM 10 MG PO TABS
10.0000 mg | ORAL_TABLET | Freq: Every day | ORAL | Status: DC
  Administered 2023-12-16: 10 mg via ORAL
  Filled 2023-12-16: qty 1

## 2023-12-16 MED ORDER — SUGAMMADEX SODIUM 200 MG/2ML IV SOLN
INTRAVENOUS | Status: DC | PRN
  Administered 2023-12-16: 400 mg via INTRAVENOUS

## 2023-12-16 MED ORDER — ORAL CARE MOUTH RINSE: 15.0000 mL | Freq: Once | OROMUCOSAL | Status: DC

## 2023-12-16 MED ORDER — LACTATED RINGERS IR SOLN
Status: DC | PRN
Start: 2023-12-16 — End: 2023-12-16
  Administered 2023-12-16: 1000 mL

## 2023-12-16 MED ORDER — OMEGA-3-ACID ETHYL ESTERS 1 G PO CAPS
4.0000 g | ORAL_CAPSULE | Freq: Every day | ORAL | Status: DC
  Administered 2023-12-16: 4 g via ORAL
  Filled 2023-12-16 (×2): qty 4

## 2023-12-16 MED ORDER — SODIUM CHLORIDE 0.9 % IV SOLN
2.0000 g | Freq: Once | INTRAVENOUS | Status: AC
  Administered 2023-12-16: 2 g via INTRAVENOUS
  Filled 2023-12-16: qty 20

## 2023-12-16 MED ORDER — LIDOCAINE 2% (20 MG/ML) 5 ML SYRINGE
INTRAMUSCULAR | Status: DC | PRN
  Administered 2023-12-16: 1.5 mg/kg/h via INTRAVENOUS

## 2023-12-16 MED ORDER — STERILE WATER FOR IRRIGATION IR SOLN
Status: DC | PRN
Start: 2023-12-16 — End: 2023-12-16
  Administered 2023-12-16: 1000 mL

## 2023-12-16 MED ORDER — ACETAMINOPHEN 10 MG/ML IV SOLN
1000.0000 mg | Freq: Once | INTRAVENOUS | Status: DC | PRN
  Administered 2023-12-16: 1000 mg via INTRAVENOUS

## 2023-12-16 MED ORDER — OXYCODONE HCL 5 MG PO TABS
5.0000 mg | ORAL_TABLET | ORAL | Status: DC | PRN
  Administered 2023-12-16 (×2): 5 mg via ORAL
  Filled 2023-12-16 (×2): qty 1

## 2023-12-16 MED ORDER — GLYCOPYRROLATE 0.2 MG/ML IJ SOLN
INTRAMUSCULAR | Status: DC | PRN
  Administered 2023-12-16: .2 mg via INTRAVENOUS

## 2023-12-16 MED ORDER — LACTATED RINGERS IV SOLN: INTRAVENOUS | Status: DC

## 2023-12-16 MED ORDER — HYOSCYAMINE SULFATE 0.125 MG SL SUBL: 0.1250 mg | SUBLINGUAL_TABLET | SUBLINGUAL | Status: DC | PRN

## 2023-12-16 MED ORDER — LIDOCAINE HCL (CARDIAC) PF 100 MG/5ML IV SOSY
PREFILLED_SYRINGE | INTRAVENOUS | Status: DC | PRN
  Administered 2023-12-16: 80 mg via INTRAVENOUS

## 2023-12-16 MED ORDER — ROCURONIUM BROMIDE 100 MG/10ML IV SOLN
INTRAVENOUS | Status: DC | PRN
  Administered 2023-12-16: 20 mg via INTRAVENOUS
  Administered 2023-12-16: 70 mg via INTRAVENOUS
  Administered 2023-12-16: 20 mg via INTRAVENOUS

## 2023-12-16 MED ORDER — DOCUSATE SODIUM 100 MG PO CAPS: 100.0000 mg | ORAL_CAPSULE | Freq: Two times a day (BID) | ORAL | Status: AC

## 2023-12-16 MED ORDER — ORAL CARE MOUTH RINSE: 15.0000 mL | OROMUCOSAL | Status: DC | PRN

## 2023-12-16 MED ORDER — HYDROMORPHONE HCL 1 MG/ML IJ SOLN
INTRAMUSCULAR | Status: AC
  Filled 2023-12-16: qty 1

## 2023-12-16 MED ORDER — KETAMINE HCL 50 MG/5ML IJ SOSY
PREFILLED_SYRINGE | INTRAMUSCULAR | Status: AC
  Filled 2023-12-16: qty 5

## 2023-12-16 MED ORDER — PROPOFOL 10 MG/ML IV BOLUS
INTRAVENOUS | Status: DC | PRN
  Administered 2023-12-16: 150 mg via INTRAVENOUS

## 2023-12-16 MED ORDER — ONDANSETRON HCL 4 MG/2ML IJ SOLN: 4.0000 mg | INTRAMUSCULAR | Status: DC | PRN

## 2023-12-16 MED ORDER — SODIUM CHLORIDE (PF) 0.9 % IJ SOLN
INTRAMUSCULAR | Status: AC
  Filled 2023-12-16: qty 20

## 2023-12-16 MED ORDER — FENTANYL CITRATE (PF) 100 MCG/2ML IJ SOLN
INTRAMUSCULAR | Status: DC | PRN
  Administered 2023-12-16: 50 ug via INTRAVENOUS
  Administered 2023-12-16: 100 ug via INTRAVENOUS

## 2023-12-16 MED ORDER — INSULIN ASPART 100 UNIT/ML IJ SOLN: 0.0000 [IU] | INTRAMUSCULAR | Status: DC | PRN

## 2023-12-16 MED ORDER — INSULIN ASPART 100 UNIT/ML IJ SOLN
0.0000 [IU] | Freq: Three times a day (TID) | INTRAMUSCULAR | Status: DC
  Administered 2023-12-16: 3 [IU] via SUBCUTANEOUS
  Administered 2023-12-17: 2 [IU] via SUBCUTANEOUS

## 2023-12-16 MED ORDER — DOCUSATE SODIUM 100 MG PO CAPS
100.0000 mg | ORAL_CAPSULE | Freq: Two times a day (BID) | ORAL | Status: DC
  Administered 2023-12-16 – 2023-12-17 (×2): 100 mg via ORAL
  Filled 2023-12-16 (×2): qty 1

## 2023-12-16 MED ORDER — HYDROCODONE-ACETAMINOPHEN 5-325 MG PO TABS: 1.0000 | ORAL_TABLET | Freq: Four times a day (QID) | ORAL | 0 refills | Status: DC | PRN

## 2023-12-16 MED ORDER — LACTATED RINGERS IV SOLN
INTRAVENOUS | Status: DC | PRN
Start: 2023-12-16 — End: 2023-12-16

## 2023-12-16 MED ORDER — AMISULPRIDE (ANTIEMETIC) 5 MG/2ML IV SOLN: 10.0000 mg | Freq: Once | INTRAVENOUS | Status: DC | PRN

## 2023-12-16 MED ORDER — KETAMINE HCL 50 MG/5ML IJ SOSY
PREFILLED_SYRINGE | INTRAMUSCULAR | Status: DC | PRN
  Administered 2023-12-16: 20 mg via INTRAVENOUS
  Administered 2023-12-16: 10 mg via INTRAVENOUS

## 2023-12-16 MED ORDER — FENTANYL CITRATE (PF) 100 MCG/2ML IJ SOLN
INTRAMUSCULAR | Status: AC
  Filled 2023-12-16: qty 2

## 2023-12-16 MED ORDER — DEXAMETHASONE SODIUM PHOSPHATE 4 MG/ML IJ SOLN
INTRAMUSCULAR | Status: DC | PRN
  Administered 2023-12-16: 5 mg via INTRAVENOUS

## 2023-12-16 MED ORDER — EPHEDRINE SULFATE (PRESSORS) 50 MG/ML IJ SOLN
INTRAMUSCULAR | Status: DC | PRN
  Administered 2023-12-16 (×2): 5 mg via INTRAVENOUS

## 2023-12-16 MED ORDER — HYDROMORPHONE HCL 1 MG/ML IJ SOLN
0.2500 mg | INTRAMUSCULAR | Status: DC | PRN
  Administered 2023-12-16: 0.5 mg via INTRAVENOUS

## 2023-12-16 MED ORDER — MIDAZOLAM HCL 2 MG/2ML IJ SOLN
INTRAMUSCULAR | Status: AC
  Filled 2023-12-16: qty 2

## 2023-12-16 MED ORDER — OXYCODONE HCL 5 MG PO TABS: 5.0000 mg | ORAL_TABLET | Freq: Once | ORAL | Status: DC | PRN

## 2023-12-16 MED ORDER — TRIPLE ANTIBIOTIC 3.5-400-5000 EX OINT: 1.0000 | TOPICAL_OINTMENT | Freq: Three times a day (TID) | CUTANEOUS | Status: DC | PRN

## 2023-12-16 MED ORDER — PANTOPRAZOLE SODIUM 40 MG PO TBEC
40.0000 mg | DELAYED_RELEASE_TABLET | Freq: Every day | ORAL | Status: DC
  Administered 2023-12-17: 40 mg via ORAL
  Filled 2023-12-16: qty 1

## 2023-12-16 MED ORDER — CHLORHEXIDINE GLUCONATE 0.12 % MT SOLN: 15.0000 mL | Freq: Once | OROMUCOSAL | Status: DC

## 2023-12-16 MED ORDER — HEPARIN SODIUM (PORCINE) 5000 UNIT/ML IJ SOLN
5000.0000 [IU] | Freq: Three times a day (TID) | INTRAMUSCULAR | Status: DC
  Administered 2023-12-16 – 2023-12-17 (×2): 5000 [IU] via SUBCUTANEOUS
  Filled 2023-12-16 (×2): qty 1

## 2023-12-16 MED ORDER — ACETAMINOPHEN 10 MG/ML IV SOLN
INTRAVENOUS | Status: AC
  Filled 2023-12-16: qty 100

## 2023-12-16 MED ORDER — ACETAMINOPHEN 500 MG PO TABS
1000.0000 mg | ORAL_TABLET | Freq: Four times a day (QID) | ORAL | Status: DC
  Administered 2023-12-16 – 2023-12-17 (×3): 1000 mg via ORAL
  Filled 2023-12-16 (×3): qty 2

## 2023-12-16 MED ORDER — DIPHENHYDRAMINE HCL 50 MG/ML IJ SOLN: 12.5000 mg | Freq: Four times a day (QID) | INTRAMUSCULAR | Status: DC | PRN

## 2023-12-16 MED ORDER — DIPHENHYDRAMINE HCL 12.5 MG/5ML PO ELIX: 12.5000 mg | ORAL_SOLUTION | Freq: Four times a day (QID) | ORAL | Status: DC | PRN

## 2023-12-16 MED ORDER — ONDANSETRON HCL 4 MG/2ML IJ SOLN
INTRAMUSCULAR | Status: DC | PRN
Start: 2023-12-16 — End: 2023-12-16
  Administered 2023-12-16: 4 mg via INTRAVENOUS

## 2023-12-16 MED ORDER — AMLODIPINE BESYLATE 10 MG PO TABS
10.0000 mg | ORAL_TABLET | Freq: Every day | ORAL | Status: DC
  Administered 2023-12-17: 10 mg via ORAL
  Filled 2023-12-16: qty 1

## 2023-12-16 MED ORDER — FENTANYL CITRATE (PF) 100 MCG/2ML IJ SOLN
INTRAMUSCULAR | Status: AC
Start: 2023-12-16 — End: 2023-12-16
  Filled 2023-12-16: qty 2

## 2023-12-16 MED ORDER — HYDROMORPHONE HCL 1 MG/ML IJ SOLN: 0.5000 mg | INTRAMUSCULAR | Status: DC | PRN

## 2023-12-16 MED ORDER — MECLIZINE HCL 25 MG PO TABS: 25.0000 mg | ORAL_TABLET | Freq: Three times a day (TID) | ORAL | Status: DC | PRN

## 2023-12-16 MED ORDER — ONDANSETRON HCL 4 MG/2ML IJ SOLN: 4.0000 mg | Freq: Once | INTRAMUSCULAR | Status: DC | PRN

## 2023-12-16 MED ORDER — BUPIVACAINE LIPOSOME 1.3 % IJ SUSP
INTRAMUSCULAR | Status: AC
  Filled 2023-12-16: qty 20

## 2023-12-16 MED ORDER — MIDAZOLAM HCL 5 MG/5ML IJ SOLN
INTRAMUSCULAR | Status: DC | PRN
  Administered 2023-12-16: 2 mg via INTRAVENOUS

## 2023-12-16 SURGICAL SUPPLY — 71 items
APPLICATOR SURGIFLO ENDO (HEMOSTASIS) IMPLANT
BAG COUNTER SPONGE SURGICOUNT (BAG) IMPLANT
BAG URINE DRAIN 2000ML AR STRL (UROLOGICAL SUPPLIES) ×2 IMPLANT
CATH FOLEY 3WAY 30CC 22FR (CATHETERS) ×2 IMPLANT
CATH FOLEY 3WAY 5CC 18FR (CATHETERS) IMPLANT
CHLORAPREP W/TINT 26 (MISCELLANEOUS) ×2 IMPLANT
CLIP LIGATING HEM O LOK PURPLE (MISCELLANEOUS) ×2 IMPLANT
CLIP LIGATING HEMO O LOK GREEN (MISCELLANEOUS) IMPLANT
COVER SURGICAL LIGHT HANDLE (MISCELLANEOUS) ×2 IMPLANT
COVER TIP SHEARS 8 DVNC (MISCELLANEOUS) ×2 IMPLANT
DERMABOND ADVANCED .7 DNX12 (GAUZE/BANDAGES/DRESSINGS) ×2 IMPLANT
DRAIN CHANNEL 15F RND FF 3/16 (WOUND CARE) ×2 IMPLANT
DRAPE ARM DVNC X/XI (DISPOSABLE) ×8 IMPLANT
DRAPE COLUMN DVNC XI (DISPOSABLE) ×2 IMPLANT
DRAPE INCISE IOBAN 66X45 STRL (DRAPES) ×2 IMPLANT
DRAPE SHEET LG 3/4 BI-LAMINATE (DRAPES) ×4 IMPLANT
DRAPE SURG IRRIG POUCH 19X23 (DRAPES) ×2 IMPLANT
DRIVER NDL LRG 8 DVNC XI (INSTRUMENTS) ×4 IMPLANT
DRIVER NDLE LRG 8 DVNC XI (INSTRUMENTS) ×4 IMPLANT
ELECT REM PT RETURN 15FT ADLT (MISCELLANEOUS) ×2 IMPLANT
EVACUATOR SILICONE 100CC (DRAIN) ×2 IMPLANT
FORCEPS BPLR FENES DVNC XI (FORCEP) ×2 IMPLANT
FORCEPS BPLR LNG DVNC XI (INSTRUMENTS) ×2 IMPLANT
FORCEPS PROGRASP DVNC XI (FORCEP) ×2 IMPLANT
GLOVE BIO SURGEON STRL SZ 6.5 (GLOVE) ×2 IMPLANT
GLOVE SURG LX STRL 7.5 STRW (GLOVE) ×6 IMPLANT
GOWN SRG XL LVL 4 BRTHBL STRL (GOWNS) ×2 IMPLANT
HOLDER FOLEY CATH W/STRAP (MISCELLANEOUS) ×2 IMPLANT
IRRIGATION SUCT STRKRFLW 2 WTP (MISCELLANEOUS) ×2 IMPLANT
KIT BASIN OR (CUSTOM PROCEDURE TRAY) ×2 IMPLANT
KIT TURNOVER KIT A (KITS) ×2 IMPLANT
LOOP VESSEL MAXI BLUE (MISCELLANEOUS) ×2 IMPLANT
NDL INSUFFLATION 14GA 120MM (NEEDLE) ×2 IMPLANT
NDL SAFETY ECLIPSE 18X1.5 (NEEDLE) ×2 IMPLANT
NEEDLE INSUFFLATION 14GA 120MM (NEEDLE) ×2 IMPLANT
NS IRRIG 1000ML POUR BTL (IV SOLUTION) ×2 IMPLANT
PAD POSITIONING PINK XL (MISCELLANEOUS) IMPLANT
PENCIL SMOKE EVACUATOR (MISCELLANEOUS) IMPLANT
PLUG CATH AND CAP STRL 200 (CATHETERS) IMPLANT
PORT ACCESS TROCAR AIRSEAL 12 (TROCAR) ×2 IMPLANT
SCISSORS MNPLR CVD DVNC XI (INSTRUMENTS) ×2 IMPLANT
SEAL UNIV 5-12 XI (MISCELLANEOUS) ×8 IMPLANT
SET IRRIG Y TYPE TUR BLADDER L (SET/KITS/TRAYS/PACK) IMPLANT
SET TRI-LUMEN FLTR TB AIRSEAL (TUBING) ×2 IMPLANT
SHEET LAVH (DRAPES) IMPLANT
SOLUTION ELECTROSURG ANTI STCK (MISCELLANEOUS) ×2 IMPLANT
SPIKE FLUID TRANSFER (MISCELLANEOUS) ×2 IMPLANT
SPONGE T-LAP 4X18 ~~LOC~~+RFID (SPONGE) ×2 IMPLANT
STENT URET 6FRX22 CONTOUR (STENTS) IMPLANT
SURGIFLO W/THROMBIN 8M KIT (HEMOSTASIS) IMPLANT
SUT ETHILON 3 0 FSL (SUTURE) ×2 IMPLANT
SUT MNCRL AB 4-0 PS2 18 (SUTURE) ×4 IMPLANT
SUT PDS AB 0 CT1 36 (SUTURE) IMPLANT
SUT PDS AB 2-0 CT2 27 (SUTURE) ×4 IMPLANT
SUT STRATA 2-0 15 CT-1.5 (SUTURE) IMPLANT
SUT VIC AB 0 CT1 27XBRD ANTBC (SUTURE) IMPLANT
SUT VIC AB 0 CT1 36 (SUTURE) IMPLANT
SUT VIC AB 2-0 CT2 27 (SUTURE) IMPLANT
SUT VIC AB 2-0 SH 27X BRD (SUTURE) IMPLANT
SUT VIC AB 3-0 SH 27X BRD (SUTURE) IMPLANT
SUT VIC AB 4-0 RB1 18 (SUTURE) IMPLANT
SUT VIC AB 4-0 RB1 27XBRD (SUTURE) IMPLANT
SUT VIC AB 4-0 SH 18 (SUTURE) IMPLANT
SUT VICRYL 0 UR6 27IN ABS (SUTURE) IMPLANT
SUTURE VLOC BRB 180 ABS3/0GR12 (SUTURE) IMPLANT
SYSTEM BAG RETRIEVAL 10MM (BASKET) IMPLANT
TOWEL OR 17X26 10 PK STRL BLUE (TOWEL DISPOSABLE) ×2 IMPLANT
TRAY LAPAROSCOPIC (CUSTOM PROCEDURE TRAY) ×2 IMPLANT
TROCAR ADV FIXATION 12X100MM (TROCAR) ×2 IMPLANT
TROCAR Z-THREAD FIOS 5X100MM (TROCAR) ×2 IMPLANT
WATER STERILE IRR 1000ML POUR (IV SOLUTION) ×2 IMPLANT

## 2023-12-16 NOTE — Transfer of Care (Signed)
 Immediate Anesthesia Transfer of Care Note  Patient: Joseph Pham  Procedure(s) Performed: REIMPLANTATION, URETER, ROBOT-ASSISTED, LAPAROSCOPIC (Left: Abdomen) REPAIR, HERNIA, UMBILICAL, ADULT (Abdomen)  Patient Location: PACU  Anesthesia Type:General  Level of Consciousness: sedated  Airway & Oxygen Therapy: Patient Spontanous Breathing and Patient connected to face mask oxygen  Post-op Assessment: Report given to RN and Post -op Vital signs reviewed and stable  Post vital signs: Reviewed and stable  Last Vitals:  Vitals Value Taken Time  BP    Temp    Pulse    Resp    SpO2      Last Pain:  Vitals:   12/16/23 0655  TempSrc:   PainSc: 0-No pain      Patients Stated Pain Goal: 3 (12/16/23 0655)  Complications: No notable events documented.

## 2023-12-16 NOTE — Discharge Instructions (Addendum)
 1- Drain Sites - You may have some mild persistent drainage from old drain site for several days, this is normal. This can be covered with cotton gauze for convenience.  2 - Stiches - Your stitches are all dissolvable. You may notice a "loose thread" at your incisions, these are normal and require no intervention. You may cut them flush to the skin with fingernail clippers if needed for comfort.  3 - Diet - No restrictions  4 - Activity - No heavy lifting / straining (any activities that require valsalva or "bearing down") x 4 weeks. Otherwise, no restrictions.  5 - Bathing - You may shower immediately. Do not take a bath or get into swimming pool where incision sites are submersed in water x 4 weeks.   6 - Catheter - Will remain in place until removed at your next appointment. It may be cleaned with soap and water in the shower. It may be disconnected from the drain bag while in the shower to avoid tripping over the tube. You may apply Neosporin or Vaseline ointment as needed to the tip of the penis where the catheter inserts to reduce friction and irritation in this spot.   7 - When to Call the Doctor - Call MD for any fever >102, any acute wound problems, or any severe nausea / vomiting. You can call the [facility] [phone]) 24 hours a day 365 days a year. It will roll-over to the answering service and on-call physician after hours.   You may resume aspirin, advil, aleve, vitamins, and supplements 7 days after surgery.

## 2023-12-16 NOTE — H&P (Signed)
 Joseph Pham is an 73 y.o. male.    Chief Complaint: Pre-OP LEFT Ureteral Reimplantation + Umbilical Hernia Repair  HPI:   1. Unfavorable intermediate risk prostate cancer s/p brachytherapy:  -He was diagnosed with cT2a GS 4+3 = 7 in 2012. He underwent brachytherapy-125 on 08/08/2011. 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 Neph tube placed. H/o prior stones and stone surgery. Appears to have impacted distal stones starting at abtou 3cm below iliac crossing.  4. 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 Follows Nephrol. Long h/o DM.   5. Bacteruria - expected colonization with neph tube. Most recent UCX klebsiella 08/2023 sens cipro , gent, bacrtrim, ancef . UCX 11/2023 negative.   6. Umbilical Hernia - large fat-containinng umbilical hernia on CT 06/2023.   PMH sig for OSA/CPAP, DM2 (A1c 6s), large obesity, L spine surgery / chornic pain / narcotics, DVT/PE/Xarelto  (managed by Dr. Monetta, cards). NO h/o ischemic CV disease. Wife Joseph Pham involved. 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 His PCP is Dr. Dovie in [place].   Today  Joseph Pham  is seen to proced with LEFT ureteral reimplant for high grade stricture. Held Xarelto  as instructed with cards clearance. Cr 2.2, Hgb 14, Most recent UCX negative. Current on neph tube excahnge.   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   Complication of anesthesia    low heart rate  during lithotripsy procedure   Deviated septum 07/19/2019   Diabetes (HCC) 03/16/2017   Essential hypertension 10/14/2015   Family history of adverse reaction to anesthesia    sister coded after surgery   GERD (gastroesophageal reflux disease)    Heart murmur    Hematuria    Hemorrhoid    History of kidney stones    Hyperlipidemia    Impingement of left ankle joint 03/06/2021   Kidney stone 03/16/2017   Lumbar radiculopathy 03/16/2017   Nasal turbinate hypertrophy 07/19/2019   OSA on CPAP    Paronychia 08/16/2015   PE (pulmonary thromboembolism) (HCC) 02/2019   Posterior tibialis tendinitis of both lower extremities 02/02/2020   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   CARPAL TUNNEL RELEASE Left 06/2016   CARPAL TUNNEL RELEASE Right 03/2017   CATARACT EXTRACTION, BILATERAL Bilateral 2005   COLONOSCOPY     last with medoff unsure when    CYSTOSCOPY WITH RETROGRADE PYELOGRAM, URETEROSCOPY AND STENT PLACEMENT Left 04/09/2022  Procedure: CYSTOSCOPY WITH RETROGRADE PYELOGRAM AND STENT PLACEMENT;  Surgeon: Carolee Sherwood JONETTA DOUGLAS, MD;  Location: [facility];  Service: Urology;  Laterality: Left;   CYSTOSCOPY WITH RETROGRADE PYELOGRAM, URETEROSCOPY AND STENT PLACEMENT Left 05/04/2022   Procedure: CYSTOSCOPY WITH RETROGRADE PYELOGRAM, URETEROSCOPY AND STENT PLACEMENT;  Surgeon: Devere Lonni Righter, MD;  Location: [facility];  Service: Urology;  Laterality: Left;   CYSTOSCOPY WITH URETEROSCOPY AND STENT PLACEMENT Right  10/28/2013   Procedure: RIGHT URETEROSCOPY AND STENT PLACEMENT;  Surgeon: Mark C Ottelin, MD;  Location[facility];  Service: Urology;  Laterality: Right;   CYSTOSCOPY/URETEROSCOPY/HOLMIUM LASER/STENT PLACEMENT Left 09/06/2020   Procedure: CYSTOSCOPY/POSSIBLE URETEROSCOPY/HOLMIUM LASER/STENT PLACEMENT;  Surgeon: Rosalind Zachary NOVAK, MD;  Location: [facility];  Service: Urology;  Laterality: Left;  30 MINS   CYSTOSCOPY/URETEROSCOPY/HOLMIUM LASER/STENT PLACEMENT Left 04/22/2022   Procedure: CYSTOSCOPY/URETEROSCOPY/HOLMIUM LASER/STENT REPLACEMENT;  Surgeon: Rosalind Zachary NOVAK, MD;  Location: [facility];  Service: Urology;  Laterality: Left;  1 HR   CYSTOSCOPY/URETEROSCOPY/HOLMIUM LASER/STENT PLACEMENT Left 05/20/2022   Procedure: CYSTOSCOPY/URETEROSCOPY/HOLMIUM LASER/STENT EXCHANGE;  Surgeon: Rosalind Zachary NOVAK, MD;  Location: [facility];  Service: Urology;  Laterality: Left;   CYSTOSCOPY/URETEROSCOPY/HOLMIUM LASER/STENT PLACEMENT Left 07/24/2023   Procedure: DIAGNOSTIC URETEROSCOPY;  Surgeon: Selma Donnice SAUNDERS, MD;  Location: [facility];  Service: Urology;  Laterality: Left;  60 MINUTES NEEDED FOR CASE   EXTRACORPOREAL SHOCK WAVE LITHOTRIPSY  02/08/2009   HOLMIUM LASER APPLICATION Right 10/28/2013   Procedure: HOLMIUM LASER LITHO;  Surgeon: Oneil JAYSON Rafter, MD;  Location[facility];  Service: Urology;  Laterality: Right;   IR CONVERT LEFT NEPHROSTOMY TO NEPHROURETERAL CATH  09/08/2023   IR NEPHROSTOGRAM LEFT THRU EXISTING ACCESS  09/01/2023   IR NEPHROSTOMY EXCHANGE LEFT  09/08/2023   IR NEPHROSTOMY EXCHANGE LEFT  11/03/2023   IR NEPHROSTOMY PLACEMENT LEFT  08/26/2023   IR NEPHROSTOMY PLACEMENT LEFT  12/14/2023   LEFT KNEE SURG.  1993  &  NOV 2011   LEFT URETEROSCOPIC STONE EXTRACTION  02/23/2009   POLYPECTOMY  2014   1 polyp per dr ira notes- no surg path to determine polyps type but was a 5 yr recall    RADIOACTIVE SEED IMPLANT  08/08/2011   Procedure: RADIOACTIVE SEED IMPLANT;  Surgeon:  Oneil JAYSON Rafter, MD;  Location: [facility];  Service: Urology;  Laterality: N/A;  seeds implanted 50 seeds found in bladder=none    REPLACEMENT TOTAL KNEE Left 2018   SKIN CANCER EXCISION     Burned off left side of nose, and head   TOTAL KNEE ARTHROPLASTY Right 08/2018   UPPER GASTROINTESTINAL ENDOSCOPY     URETEROSCOPY  03/15/2012   Procedure: URETEROSCOPY;  Surgeon: Oneil JAYSON Rafter, MD;  Location: [facility];  Service: Urology;  Laterality: Right;  Right Ueteroscopy     Family History  Problem Relation Age of Onset   Cancer Mother    Diabetes Father    CAD Father    Heart attack Father    Diabetes Sister    Colon polyps Sister    Colon polyps Brother    Colon cancer Neg Hx    Esophageal cancer Neg Hx    Rectal cancer Neg Hx    Stomach cancer Neg Hx    Social History:  reports that he quit smoking about 30 years ago. His smoking use included cigarettes. He started smoking about 40 years ago. He has quit using smokeless tobacco.  His smokeless tobacco use included chew. He reports that he does not  drink alcohol and does not use drugs.  Allergies:  Allergies  Allergen Reactions   Aspirin Anaphylaxis   Doxycycline Nausea And Vomiting   Amoxicillin Itching   Chlorhexidine  Gluconate Rash    Medications Prior to Admission  Medication Sig Dispense Refill   acetaminophen  (TYLENOL ) 500 MG tablet Take 1,000 mg by mouth every 8 (eight) hours as needed for moderate pain (pain score 4-6).     amLODipine  (NORVASC ) 10 MG tablet Take 1 tablet (10 mg total) by mouth daily. 90 tablet 3   atorvastatin  (LIPITOR) 10 MG tablet Take 10 mg by mouth at bedtime.     fexofenadine (ALLEGRA) 180 MG tablet Take 180 mg by mouth in the morning.     fluticasone  (FLONASE ) 50 MCG/ACT nasal spray Place 2 sprays into the nose daily as needed for allergies.      JARDIANCE 10 MG TABS tablet Take 10 mg by mouth every morning.     meclizine  (ANTIVERT ) 25 MG tablet Take 1 tablet  (25 mg total) by mouth 3 (three) times daily as needed for dizziness. 30 tablet 0   Melatonin 5 MG CAPS Take 5 mg by mouth at bedtime.     omega-3 acid ethyl esters (LOVAZA ) 1 g capsule Take 4 g by mouth at bedtime.     omeprazole (PRILOSEC) 20 MG capsule Take 20 mg by mouth in the morning and at bedtime.     Polyethyl Glycol-Propyl Glycol (SYSTANE) 0.4-0.3 % SOLN Place 1-2 drops into both eyes 3 (three) times daily as needed (dry/irritated eyes.).     potassium citrate (UROCIT-K) 10 MEQ (1080 MG) SR tablet Take 10 mEq by mouth in the morning.     predniSONE  (STERAPRED UNI-PAK 21 TAB) 10 MG (21) TBPK tablet Take by mouth daily. Take 6 tabs by mouth daily  for 2 days, then 5 tabs for 2 days, then 4 tabs for 2 days, then 3 tabs for 2 days, 2 tabs for 2 days, then 1 tab by mouth daily for 2 days 42 tablet 0   rivaroxaban  (XARELTO ) 10 MG TABS tablet Take 10 mg by mouth in the morning.     traMADol  (ULTRAM ) 50 MG tablet Take 50 mg by mouth in the morning and at bedtime.     traZODone  (DESYREL ) 100 MG tablet Take 100 mg by mouth at bedtime.     vitamin B-12 (CYANOCOBALAMIN) 500 MCG tablet Take 500 mcg by mouth in the morning.     VITAMIN D  PO Take 5,000 Units by mouth in the morning.     hydrocortisone 2.5 % cream Apply 1 Application topically daily as needed (ear irritation).      Results for orders placed or performed during the hospital encounter of 12/16/23 (from the past 48 hours)  Glucose, capillary     Status: Abnormal   Collection Time: 12/16/23  6:44 AM  Result Value Ref Range   Glucose-Capillary 135 (H) 70 - 99 mg/dL    Comment: Glucose reference range applies only to samples taken after fasting for at least 8 hours.   IR NEPHROSTOMY PLACEMENT LEFT Result Date: 12/14/2023 INDICATION: Left nephrostomy inadvertently removed EXAM: FLUOROSCOPIC REPLACEMENT OF THE 10 FRENCH LEFT NEPHROSTOMY Date:  12/14/2023 12/14/2023 1:54 pm Radiologist:  CHRISTELLA. Frederic Specking, MD Guidance:  Fluoroscopic FLUOROSCOPY:  1.0 minutes 24 seconds (36 mGy). MEDICATIONS: 1% lidocaine  local ANESTHESIA/SEDATION: None. CONTRAST:  15mL OMNIPAQUE  IOHEXOL  300 MG/ML  SOLN COMPLICATIONS: None immediate. PROCEDURE: Informed consent was obtained from the patient following explanation of the procedure,  risks, benefits and alternatives. The patient understands, agrees and consents for the procedure. All questions were addressed. A time out was performed. Maximal barrier sterile technique utilized including caps, mask, sterile gowns, sterile gloves, large sterile drape, hand hygiene, and ChloraPrep. Under sterile conditions, the existing percutaneous nephrostomy tract was cannulated easily with a Kumpe catheter. Contrast injection confirms position in the renal pelvis. Mild hydronephrosis noted. Amplatz guidewire inserted. Tract dilatation performed to insert a 10 [place] drain. Retention loop formed the renal pelvis. Position confirmed with contrast injection. Images obtained for documentation. Catheter secured with a silk suture and a sterile dressing. Gravity drainage bag connected. No immediate complication. Patient tolerated the procedure well. IMPRESSION: Successful replacement of the 10 French left nephrostomy. Electronically Signed   By: CHRISTELLA.  Shick M.D.   On: 12/14/2023 14:22    Review of Systems  Constitutional:  Negative for chills and fever.  All other systems reviewed and are negative.   Blood pressure (!) 147/97, pulse 64, temperature 98.5 F (36.9 C), temperature source Oral, resp. rate 20, height 5' 6 (1.676 m), weight 116 kg, SpO2 96%. Physical Exam Vitals reviewed.  Constitutional:      Comments: Wife and son at bedside.   HENT:     Head: Normocephalic.  Eyes:     Pupils: Pupils are equal, round, and reactive to light.  Cardiovascular:     Rate and Rhythm: Normal rate.  Abdominal:     Comments: Significant truncal obesity limits sensitivity of exam.   Genitourinary:    Comments: Left neph tube in place with  non-foul urine Musculoskeletal:        General: Normal range of motion.     Cervical back: Normal range of motion.  Skin:    General: Skin is warm.  Neurological:     General: No focal deficit present.     Mental Status: He is alert.  Psychiatric:        Mood and Affect: Mood normal.      Assessment/Plan  Proceed as planned with LEFT robotic ureteral reimplant / likely psoas hitch and umbilical hernia repair. Risks, benefdits, alternatives, expectged peri-op course discussed previousliy and reiterated today. He understands that his CV, pulm, and metabolic comorbidity increases risk of ALL peri-op complications including mortality.   Ricardo KATHEE Alvaro Mickey., MD 12/16/2023, 7:03 AM

## 2023-12-16 NOTE — Anesthesia Procedure Notes (Signed)
 Procedure Name: Intubation Date/Time: 12/16/2023 8:38 AM  Performed by: Dartha Meckel, CRNAPre-anesthesia Checklist: Patient identified, Emergency Drugs available, Suction available and Patient being monitored Patient Re-evaluated:Patient Re-evaluated prior to induction Oxygen Delivery Method: Circle system utilized Preoxygenation: Pre-oxygenation with 100% oxygen Induction Type: IV induction Ventilation: Mask ventilation without difficulty Laryngoscope Size: Glidescope and 4 Grade View: Grade I Tube type: Oral Tube size: 7.5 mm Number of attempts: 1 Airway Equipment and Method: Stylet and Oral airway Placement Confirmation: ETT inserted through vocal cords under direct vision, positive ETCO2 and breath sounds checked- equal and bilateral Secured at: 22 cm Tube secured with: Tape Dental Injury: Teeth and Oropharynx as per pre-operative assessment

## 2023-12-16 NOTE — Anesthesia Postprocedure Evaluation (Signed)
 Anesthesia Post Note  Patient: Joseph Pham  Procedure(s) Performed: REIMPLANTATION, URETER, ROBOT-ASSISTED, LAPAROSCOPIC (Left: Abdomen) REPAIR, HERNIA, UMBILICAL, ADULT (Abdomen)     Patient location during evaluation: PACU Anesthesia Type: General Level of consciousness: awake and alert Pain management: pain level controlled Vital Signs Assessment: post-procedure vital signs reviewed and stable Respiratory status: spontaneous breathing, nonlabored ventilation, respiratory function stable and patient connected to nasal cannula oxygen Cardiovascular status: blood pressure returned to baseline and stable Postop Assessment: no apparent nausea or vomiting Anesthetic complications: no   No notable events documented.  Last Vitals:  Vitals:   12/16/23 1215 12/16/23 1230  BP: (!) 116/59 (!) 109/57  Pulse: (!) 51 (!) 53  Resp: 15 13  Temp:    SpO2: 92% 92%    Last Pain:  Vitals:   12/16/23 1245  TempSrc:   PainSc: Asleep                 Garnette DELENA Gab

## 2023-12-16 NOTE — Op Note (Signed)
 NAMELJ, MIYAMOTO MEDICAL RECORD NO: [id number] ACCOUNT NO: [id number] DATE OF BIRTH: 23-Aug-1950 FACILITY: [facility] LOCATION: [id number] PHYSICIAN: Ricardo Likens, MD  Operative Report   DATE OF PROCEDURE: 12/16/2023  PREOPERATIVE DIAGNOSES: 1.  Left ureteral stricture, high grade. 2.  Large umbilical hernia.  PROCEDURE PERFORMED: 1.  Robotic-assisted laparoscopic left ureteral reimplant with psoas hitch and left ureteral stent placement. 2.  Umbilical hernia repair.  ESTIMATED BLOOD LOSS:  Nil.  COMPLICATIONS:  None.  SPECIMENS:  Left distal ureter with stricture and stones for pathology  FINDINGS: 1.  Reducible umbilical hernia. 2.  Scarified left distal ureter as anticipated extending to an area approximately 2-3 cm above the ureterovesical junction. 3.  Successful placement of left ureteral stent.  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  He has had excellent biochemical control of that.  He also has a history of urolithiasis and was  noted recently to have left multifocal ureteral stones and hydronephrosis consistent with obstructing stones.  He underwent ureteroscopy.  He was noted to have severe high-grade stricture in the left distal ureter, essentially blind ending, not amenable  to stenting.  This is at a location distal to his conglomerate of stones.  He underwent renal decompression with nephrostomy tube.  Further options were discussed for management of his high-grade left distal ureteral stricture including attempted  antegrade stenting chronically versus chronic nephrostomy tube versus ureteral reconstruction with the goal of stent free and he wished to proceed with the latter.   He does have a large umbilical hernia that I  recommended concomitant repair.  He has held his Eliquis as per cardiology clearance.  Informed consent was obtained and placed in medical  record.  DESCRIPTION OF PROCEDURE:  The patient being identified and verified, procedure being left ureter reimplant, stent placement and umbilical hernia repair was confirmed.  Procedure timeout was performed.  Intravenous antibiotics administered.  General  endotracheal anesthesia was induced.  The patient was placed into a low lithotomy position.  A sterile field was created, prepped and draped the patient's penis, perineum and proximal thighs using iodine and his infraxiphoid abdomen using DuraPrep after  clipper shaving and after he was further fastened to operative table using a 3-inch tape over foam padding across the supraxiphoid chest. A test of steep Trendelenburg positioning was performed.  He was found to be suitably positioned.  Foley catheter  was placed per urethra to straight drain, an 18-French 3-way type.  The umbilical hernia was reduced such that the known fat contents were pushed proximal to the external fascia and high flow pressure pneumoperitoneum was obtained using Veress techniques  in the supraumbilical midline having passed the aspiration and drop test.  An 8 mm robotic camera port was then placed in the same location.  Laparoscopic examination of peritoneal cavity did reveal some colonic redundancy in the area of the sigmoid and  some omental fat adhesions to the backside of the hernia sac as anticipated.  Additional ports were placed as follows: Left paramedian 8-mm robotic port, left far lateral 8-mm robotic port, right far lateral 12-mm assist port, right paramedian 8-mm  robotic port, right paramedian 5-mm suction port.  The robot was docked and passed the electronic checks.  Attention was directed at omental adhesiolysis.  Once adhesiolysis was performed of omental fat entering the hernia sac behind the umbilicus was  easily released and there  were no bowel contents in this whatsoever.  Next, some interloop and sidewall adhesions were taken down from the area of the sigmoid  to allow better mobility and better visualization of the left retroperitoneum.  An incision was  made in the inferolateral posterior peritoneum from the area of the iliac vessel superiorly for a distance approximately 16 cm and then distally coursing just lateral to the left medial umbilical ligament.  The vas deferens was encountered and purposely  ligated and used as a medial bucket handle and his peritoneal flap was hooked medially.  The ureter was encountered over the course of the iliac vessels as marked vessel loop dissected proximally to the area of the gonadal crossing and then distally to  the area of the ureterovesical junction.  As anticipated, there was significant desmoplastic reaction of the left ureter beginning approximately 3 cm above the ureterovesical junction.  This was also approximately 3 cm below the iliac crossing consistent  with his stricture.  There was minimal hydronephrosis above this despite clamping his in situ nephrostomy tube.  The distal aspect of the ureter was then clipped using gold clip and was transected proximal to this at an area that appeared grossly to be  away from the area of desmoplasia and stricture and the mucosa in this area did look vascular and non-scarified.  We considered several geometries for reconstruction including simple reimplant; however, I felt that there would be excessive tension with  this.  Decision was made to mobilize the wire and perform a psoas hitch.  Space of Retzius was developed through the medial umbilical ligaments towards the area of the pubic rim.  This provided very good bladder mobility.  The bladder was then filled  with 200 mL of saline and the psoas hitch geometry was laid out and with this, the ureter appeared easily anastomosed to the bladder dome without tension.  The superolateral left perivesical tissue was then anchored to the psoas musculature in the  direction of the psoas tendon to avoid nerve injury.  This was  performed x2.  This resulted in excellent superolateral tethering of the left bladder dome towards the area of the superior left lateral side.  A small cystotomy was made in the area of the dome  approximately 8 mm in length.  The ureter was once again identified and spatulated.  A heel stitch of 4-0 Vicryl was placed.  A 6 x 22 contour-type stent was then placed over a sensor working wire  to the level of the kidney.  This  limb was placed into the small cystotomy and further ureter to bladder anastomosis was performed using 2 separate running suture layers of 4-0 Vicryl in a heel-to-toe type fashion.  This resulted in excellent tension-free apposition of the reimplant.  I  was quite happy with the geometry of this.  Perivesical peritoneal tissue was then wrapped around the area of reimplant to provide coverage of the anastomosis.  This was loosely approximated using running Vicryl.  Sponge and needle counts were correct.   Hemostasis was excellent.  We achieved the goal of the reconstructed portion of the procedure today.  The distal ureter segment that was excised was set aside for permanent pathology.  Closed suction was brought to the previous left lateral and most  robotic port site entering the renal cavity.  The robot was then undocked.  The previous right distal port site was closed at the level of the fascia using a Carter-Thomason suture passer with 0 Vicryl.  The previous camera port site was then extended,  which totaled approximately 7 cm erring to the right side of the umbilicus and this encompassing the presumed hernia sac and dissection was carried down through the fascia of the camera site and the fascia of this was connected to the hernia fascia.  The  hernia sac was completely mobilized and excised such that viable and robust fascial corners were completely visualized, which did encompass his prior hernia.  This was reapproximated using simple Novafil x10 as well as complete resolution of  the hernia  defect.  I was quite happy with the tension-free nature of this.  The scarpas was reapproximated using running Vicryl.  All incision sites were infiltrated with dilute lipolyzed Marcaine  and closed at the level of the skin using subcuticular Monocryl  followed by Dermabond.  Procedure was then terminated.  The patient tolerated the procedure well with no immediate periprocedural complication.  The patient was taken to postanesthesia care unit in stable condition.  Plan for inpatient admission.   Continue Foley catheter.  Please note first assistant, Alan Hammonds was crucial for all portions of the surgery today.  She provided invaluable retraction, suctioning, ureteral clipping, robotic instrument exchange, and general first assistance.   PUS D: 12/16/2023 11:20:25 am T: 12/16/2023 2:24:00 pm  JOB: [id number]/ [id number]

## 2023-12-16 NOTE — Brief Op Note (Signed)
 12/16/2023  11:09 AM  PATIENT:  Joseph Pham  73 y.o. male  PRE-OPERATIVE DIAGNOSIS:  LEFT URETERAL STRICTURE, UMBILICAL HERNIA  POST-OPERATIVE DIAGNOSIS:  LEFT URETERAL STRICTURE, UMBILICAL HERNIA  PROCEDURE:  Procedure(s): REIMPLANTATION, URETER, ROBOT-ASSISTED, LAPAROSCOPIC (Left) REPAIR, HERNIA, UMBILICAL, ADULT (N/A)  SURGEON:  Surgeons and Role:    * Manny, Ricardo KATHEE Raddle., MD - Primary  PHYSICIAN ASSISTANT:   ASSISTANTS: Alan Hammonds PA   ANESTHESIA:   local and general  EBL:  10 mL   BLOOD ADMINISTERED:none  DRAINS: 1 - foley to gravity; 2 - JP to bulb   LOCAL MEDICATIONS USED:  MARCAINE      SPECIMEN:  Source of Specimen:  left distal ureter with stricture + stones  DISPOSITION OF SPECIMEN:  PATHOLOGY  COUNTS:  YES  TOURNIQUET:  * No tourniquets in log *  DICTATION: .Other Dictation: Dictation Number [id number]  PLAN OF CARE: Admit to inpatient   PATIENT DISPOSITION:  PACU - hemodynamically stable.   Delay start of Pharmacological VTE agent (>24hrs) due to surgical blood loss or risk of bleeding: yes

## 2023-12-16 NOTE — Progress Notes (Signed)
   12/16/23 2304  BiPAP/CPAP/SIPAP  $ Non-Invasive Home Ventilator  Initial  BiPAP/CPAP/SIPAP Pt Type Adult  BiPAP/CPAP/SIPAP DREAMSTATIOND  Mask Type Nasal mask  Dentures removed? Not applicable  Respiratory Rate 18 breaths/min  EPAP 9 cmH2O  Patient Home Machine No  Patient Home Mask Yes  Patient Home Tubing Yes  Auto Titrate No  Device Plugged into RED Power Outlet Yes

## 2023-12-17 ENCOUNTER — Encounter (HOSPITAL_COMMUNITY): Payer: Self-pay | Admitting: Urology

## 2023-12-17 LAB — BASIC METABOLIC PANEL WITH GFR
Anion gap: 10 (ref 5–15)
BUN: 22 mg/dL (ref 8–23)
CO2: 22 mmol/L (ref 22–32)
Calcium: 8.3 mg/dL — ABNORMAL LOW (ref 8.9–10.3)
Chloride: 103 mmol/L (ref 98–111)
Creatinine, Ser: 1.92 mg/dL — ABNORMAL HIGH (ref 0.61–1.24)
GFR, Estimated: 37 mL/min — ABNORMAL LOW (ref >60)
Glucose, Bld: 137 mg/dL — ABNORMAL HIGH (ref 70–99)
Potassium: 4.3 mmol/L (ref 3.5–5.1)
Sodium: 135 mmol/L (ref 135–145)

## 2023-12-17 LAB — SURGICAL PATHOLOGY

## 2023-12-17 LAB — HEMOGLOBIN AND HEMATOCRIT, BLOOD
HCT: 38.9 % — ABNORMAL LOW (ref 39.0–52.0)
Hemoglobin: 12.8 g/dL — ABNORMAL LOW (ref 13.0–17.0)

## 2023-12-17 LAB — GLUCOSE, CAPILLARY: Glucose-Capillary: 130 mg/dL — ABNORMAL HIGH (ref 70–99)

## 2023-12-17 NOTE — Discharge Summary (Signed)
 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   Mucous cyst of tonsil 01/08/2023   Hydronephrosis 05/03/2022   Nephrolithiasis 05/03/2022   AKI (acute kidney injury) (HCC) 05/03/2022   Lactic acidosis 05/03/2022   Complication of anesthesia 04/16/2022   AAA (abdominal aortic aneurysm) (HCC) 04/16/2022   UTI (urinary tract infection) 04/09/2022   Impingement of left ankle joint 03/06/2021   Tibialis posterior tendinitis 02/11/2021   Wears glasses    Type 2 diabetes, diet controlled (HCC)    Sleep apnea    Skin cancer    Right ureteral stone    Right flank pain    OSA on CPAP    Hypertension    History of kidney stones    Hemorrhoid    Hematuria    Cataract    Allergy    Posterior tibialis tendinitis of both lower extremities 02/02/2020   Pronation deformity of both feet 02/02/2020   Tinnitus, bilateral 08/31/2019   Bilateral hearing loss 07/19/2019   Chronic tonsillitis 07/19/2019   Deviated septum 07/19/2019   Nasal turbinate hypertrophy 07/19/2019   PE (pulmonary thromboembolism) (HCC) 02/03/2019   CKD (chronic kidney disease), 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   GERD (gastroesophageal reflux disease)    OSA (obstructive sleep apnea)    Chest pain    Chest pain in adult 04/13/2018   Acid reflux 03/16/2017   Arthritis 03/16/2017   Hyperlipidemia 03/16/2017   Kidney stone 03/16/2017   Lumbar radiculopathy 03/16/2017   Prostate cancer (HCC) 03/16/2017   Diabetes (HCC) 03/16/2017   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 Briefly, CAULIN BEGLEY is a 73 y.o. year old patient with hx of DVT and PE on Xarelto , Pcx s/p radiation, and nephrolithiasis with severe high-grade left distal ureteral stricture presenting for ureteral reimplant.   Hospital Course: Patient tolerated the procedure well.  He was then transferred to the floor after an uneventful PACU stay.  His hospital course was uncomplicated.  On POD#1 he had met discharge criteria: was eating a regular diet, was up and ambulating independently,  pain was well controlled, JP drain removed, catheter draining appropriately, and was ready to for discharge.   Laboratory values:  Recent Labs    12/16/23 1146 12/16/23 1718 12/17/23 0429  WBC  --  10.2  --   HGB 13.2 13.8 12.8*  HCT 41.1 41.9 38.9*   Recent Labs    12/16/23 1718 12/17/23 0429  NA  --  135  K  --  4.3  CL  --  103  CO2  --  22  GLUCOSE  --  137*  BUN  --  22  CREATININE 1.95* 1.92*  CALCIUM   --  8.3*   No results for input(s): LABPT, INR in the last 72 hours. No results for input(s): LABURIN in the last 72 hours. Results for orders  placed or performed during the hospital encounter of 11/30/23  Resp panel by RT-PCR (RSV, Flu A&B, Covid) Anterior Nasal Swab     Status: None   Collection Time: 12/01/23  1:28 AM   Specimen: Anterior Nasal Swab  Result Value Ref Range Status   SARS Coronavirus 2 by RT PCR NEGATIVE NEGATIVE Final   Influenza A by PCR NEGATIVE NEGATIVE Final   Influenza B by PCR NEGATIVE NEGATIVE Final    Comment: (NOTE) The Xpert Xpress SARS-CoV-2/FLU/RSV plus assay is intended as an aid in the diagnosis of  influenza from Nasopharyngeal swab specimens and should not be used as a sole basis for treatment. Nasal washings and aspirates are unacceptable for Xpert Xpress SARS-CoV-2/FLU/RSV testing.  Fact Sheet for Patients: BloggerCourse.com  Fact Sheet for Healthcare Providers: SeriousBroker.it  This test is not yet approved or cleared by the [place]  FDA and has been authorized for detection and/or diagnosis of SARS-CoV-2 by FDA under an Emergency Use Authorization (EUA). This EUA will remain in effect (meaning this test can be used) for the duration of the COVID-19 declaration under Section 564(b)(1) of the Act, 21 U.S.C. section 360bbb-3(b)(1), unless the authorization is terminated or revoked.     Resp Syncytial Virus by PCR NEGATIVE NEGATIVE Final    Comment: (NOTE) Fact Sheet for Patients: BloggerCourse.com  Fact Sheet for Healthcare Providers: SeriousBroker.it  This test is not yet approved or cleared by the [place]  FDA and has been authorized for detection and/or diagnosis of SARS-CoV-2 by FDA under an Emergency Use Authorization (EUA). This EUA will remain in effect (meaning this test can be used) for the duration of the COVID-19 declaration under Section 564(b)(1) of the Act, 21 U.S.C. section 360bbb-3(b)(1), unless the authorization is terminated or revoked.  Performed at [facility], [address], [place], [place] [address]     Disposition: Home  Discharge instruction: The patient was instructed to be ambulatory but told to refrain from heavy lifting, strenuous activity, or driving.   Discharge medications:  Allergies as of 12/17/2023       Reactions   Aspirin Anaphylaxis   Doxycycline Nausea And Vomiting   Amoxicillin Itching   Chlorhexidine  Gluconate Rash        Medication List     STOP taking these medications    Melatonin 5 MG  Caps   predniSONE  10 MG (21) Tbpk tablet Commonly known as: STERAPRED UNI-PAK 21 TAB   traMADol  50 MG tablet Commonly known as: ULTRAM    vitamin B-12 500 MCG tablet Commonly known as: CYANOCOBALAMIN   VITAMIN D  PO   Xarelto  10 MG Tabs tablet Generic drug: rivaroxaban        TAKE these medications    acetaminophen  500 MG tablet Commonly known as: TYLENOL  Take 1,000 mg by mouth every 8 (eight) hours as needed for moderate pain (pain score 4-6).   amLODipine  10 MG tablet Commonly known as: NORVASC  Take 1 tablet (10 mg total) by mouth daily.   atorvastatin  10 MG tablet Commonly known as: LIPITOR Take 10 mg by mouth at bedtime.   docusate sodium  100 MG capsule Commonly known as: COLACE Take 1 capsule (100 mg total) by mouth 2 (two) times daily.   fexofenadine 180 MG tablet Commonly known as: ALLEGRA Take 180 mg by mouth in the morning.   fluticasone  50 MCG/ACT nasal spray Commonly known as: FLONASE  Place 2 sprays into the nose daily as needed for allergies.   HYDROcodone -acetaminophen  5-325 MG tablet Commonly known as: NORCO/VICODIN  Take 1-2 tablets by mouth every 6 (six) hours as needed for moderate pain (pain score 4-6) or severe pain (pain score 7-10).   hydrocortisone 2.5 % cream Apply 1 Application topically daily as needed (ear irritation).   Jardiance 10 MG Tabs tablet Generic drug: empagliflozin Take 10 mg by mouth every morning.   meclizine  25 MG tablet Commonly known as: ANTIVERT  Take 1 tablet (25 mg total) by mouth 3 (three) times daily as needed for dizziness.   omega-3 acid ethyl esters 1 g capsule Commonly known as: LOVAZA  Take 4 g by mouth at bedtime.   omeprazole 20 MG capsule Commonly known as: PRILOSEC Take 20 mg by mouth in the morning and at bedtime.   potassium citrate 10 MEQ (1080 MG) SR tablet Commonly known as: UROCIT-K Take 10 mEq by mouth in the morning.   Systane 0.4-0.3 % Soln Generic drug: Polyethyl Glycol-Propyl  Glycol Place 1-2 drops into both eyes 3 (three) times daily as needed (dry/irritated eyes.).   traZODone  100 MG tablet Commonly known as: DESYREL  Take 100 mg by mouth at bedtime.        Followup:   Follow-up Information     Alvaro Ricardo KATHEE Raddle., MD Follow up on 12/28/2023.   Specialty: Urology Why: at 1:15 for X-Ray, MD visit, and likely catheter removal. Contact information: [address] [place] [address] [phone]

## 2023-12-17 NOTE — TOC Initial Note (Addendum)
 Transition of Care [facility]) - Initial/Assessment Note    Patient Details  Name: Joseph Pham MRN: [id number] Date of Birth: 03/31/1951  Transition of Care [facility]) CM/SW Contact:    Sonda Manuella Quill, RN Phone Number: 12/17/2023, 10:08 AM  Clinical Narrative:                 Spoke w/ pt, wife, and son in room; pt says he lives at home w/ his wife; he plans to return at d/c; pt identified POC [name] (spouse) [phone] / [name] (son) [phone]; they will provide transportation; pt verified insurance/PCP; he denied SDOH risks; pt has cane, Rollator; BSC, and shower chair; he does not have HH services or home oxygen; no TOC needs.  Expected Discharge Plan: Home/Self Care Barriers to Discharge: No Barriers Identified   Patient Goals and CMS Choice Patient states their goals for this hospitalization and ongoing recovery are:: home CMS Medicare.gov Compare Post Acute Care list provided to:: Patient        Expected Discharge Plan and Services   Discharge Planning Services: CM Consult   Living arrangements for the past 2 months: Single Family Home Expected Discharge Date: 12/17/23               DME Arranged: N/A DME Agency: NA       HH Arranged: NA HH Agency: NA        Prior Living Arrangements/Services Living arrangements for the past 2 months: Single Family Home Lives with:: Spouse Patient language and need for interpreter reviewed:: Yes Do you feel safe going back to the place where you live?: Yes      Need for Family Participation in Patient Care: Yes (Comment) Care giver support system in place?: Yes (comment) Current home services: DME (cane, Rollator) Criminal Activity/Legal Involvement Pertinent to Current Situation/Hospitalization: No - Comment as needed  Activities of Daily Living   ADL Screening (condition at time of admission) Independently performs ADLs?: Yes (appropriate for developmental age) Is the patient deaf or have difficulty  hearing?: No Does the patient have difficulty seeing, even when wearing glasses/contacts?: No Does the patient have difficulty concentrating, remembering, or making decisions?: No  Permission Sought/Granted Permission sought to share information with : Case Manager Permission granted to share information with : Yes, Verbal Permission Granted  Share Information with NAME: Case Manager     Permission granted to share info w Relationship: [name] (spouse) [phone] / [name] (son) [phone]     Emotional Assessment Appearance:: Appears stated age Attitude/Demeanor/Rapport: Gracious Affect (typically observed): Accepting Orientation: : Oriented to Self, Oriented to Situation, Oriented to Place Alcohol / Substance Use: Not Applicable Psych Involvement: No (comment)  Admission diagnosis:  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] Umbilical hernia without obstruction or gangrene [K42.9] 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] Patient Active Problem List   Diagnosis Date Noted   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   Arthritis of left subtalar joint 02/26/2023   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   Mucous cyst of tonsil 01/08/2023   Hydronephrosis 05/03/2022   Nephrolithiasis 05/03/2022   AKI (acute kidney injury) (HCC) 05/03/2022   Lactic acidosis 05/03/2022   Complication of anesthesia 04/16/2022   AAA (abdominal aortic aneurysm) (HCC) 04/16/2022   UTI (urinary tract infection) 04/09/2022   Impingement of left ankle joint 03/06/2021   Tibialis posterior tendinitis 02/11/2021   Wears glasses    Type 2 diabetes, diet controlled (HCC)    Sleep apnea    Skin cancer    Right ureteral stone  Right flank pain    OSA on CPAP    Hypertension    History of kidney stones    Hemorrhoid    Hematuria    Cataract    Allergy    Posterior tibialis tendinitis of both lower extremities 02/02/2020   Pronation deformity of both feet 02/02/2020   Tinnitus, bilateral 08/31/2019    Bilateral hearing loss 07/19/2019   Chronic tonsillitis 07/19/2019   Deviated septum 07/19/2019   Nasal turbinate hypertrophy 07/19/2019   PE (pulmonary thromboembolism) (HCC) 02/03/2019   CKD (chronic kidney disease), 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   GERD (gastroesophageal reflux disease)    OSA (obstructive sleep apnea)    Chest pain    Chest pain in adult 04/13/2018   Acid reflux 03/16/2017   Arthritis 03/16/2017   Hyperlipidemia 03/16/2017   Kidney stone 03/16/2017   Lumbar radiculopathy 03/16/2017   Prostate cancer (HCC) 03/16/2017   Diabetes (HCC) 03/16/2017   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 [place] [address] Phone: [phone] Fax: [phone]  [facility] - [place], [place] - [address] [place] [address] Phone: [phone] Fax: [phone]     Social Drivers of Health (SDOH) Social History: SDOH Screenings   Food Insecurity: No Food Insecurity (12/17/2023)  Housing: Low Risk  (12/17/2023)  Transportation Needs: No Transportation Needs (12/17/2023)  Utilities: Not At Risk (12/17/2023)  Social Connections: Socially Integrated (12/16/2023)  Tobacco Use: Medium Risk (12/16/2023)   SDOH Interventions: Food Insecurity Interventions: Intervention Not Indicated, Inpatient TOC Housing Interventions: Intervention Not Indicated, Inpatient TOC Transportation Interventions: Intervention Not Indicated, Inpatient  TOC Utilities Interventions: Intervention Not Indicated, Inpatient TOC   Readmission Risk Interventions    12/17/2023   10:05 AM 04/11/2022    9:05 AM  Readmission Risk Prevention Plan  Transportation Screening Complete Complete  PCP or Specialist Appt within 5-7 Days Complete Complete  Home Care Screening Complete Complete  Medication Review (RN CM) Complete Complete

## 2023-12-28 DIAGNOSIS — N135 Crossing vessel and stricture of ureter without hydronephrosis: Secondary | ICD-10-CM | POA: Diagnosis not present

## 2023-12-29 ENCOUNTER — Other Ambulatory Visit (HOSPITAL_COMMUNITY)

## 2024-01-08 ENCOUNTER — Inpatient Hospital Stay (HOSPITAL_COMMUNITY)
Admission: EM | Admit: 2024-01-08 | Discharge: 2024-01-10 | DRG: 683 | Disposition: A | Discharge status: Home / Self Care | Attending: Internal Medicine | Admitting: Internal Medicine

## 2024-01-08 ENCOUNTER — Other Ambulatory Visit: Payer: Self-pay

## 2024-01-08 ENCOUNTER — Encounter (HOSPITAL_COMMUNITY): Payer: Self-pay

## 2024-01-08 ENCOUNTER — Emergency Department (HOSPITAL_COMMUNITY)

## 2024-01-08 DIAGNOSIS — E785 Hyperlipidemia, unspecified: Secondary | ICD-10-CM | POA: Diagnosis present

## 2024-01-08 DIAGNOSIS — Z9841 Cataract extraction status, right eye: Secondary | ICD-10-CM

## 2024-01-08 DIAGNOSIS — N179 Acute kidney failure, unspecified: Principal | ICD-10-CM | POA: Diagnosis present

## 2024-01-08 DIAGNOSIS — E1122 Type 2 diabetes mellitus with diabetic chronic kidney disease: Secondary | ICD-10-CM | POA: Diagnosis present

## 2024-01-08 DIAGNOSIS — Z936 Other artificial openings of urinary tract status: Secondary | ICD-10-CM | POA: Diagnosis not present

## 2024-01-08 DIAGNOSIS — I5032 Chronic diastolic (congestive) heart failure: Secondary | ICD-10-CM | POA: Diagnosis not present

## 2024-01-08 DIAGNOSIS — Z880 Allergy status to penicillin: Secondary | ICD-10-CM

## 2024-01-08 DIAGNOSIS — Z7901 Long term (current) use of anticoagulants: Secondary | ICD-10-CM

## 2024-01-08 DIAGNOSIS — Z86718 Personal history of other venous thrombosis and embolism: Secondary | ICD-10-CM | POA: Diagnosis not present

## 2024-01-08 DIAGNOSIS — G4733 Obstructive sleep apnea (adult) (pediatric): Secondary | ICD-10-CM | POA: Diagnosis present

## 2024-01-08 DIAGNOSIS — Z833 Family history of diabetes mellitus: Secondary | ICD-10-CM

## 2024-01-08 DIAGNOSIS — Z87891 Personal history of nicotine dependence: Secondary | ICD-10-CM

## 2024-01-08 DIAGNOSIS — K5900 Constipation, unspecified: Secondary | ICD-10-CM | POA: Diagnosis present

## 2024-01-08 DIAGNOSIS — R310 Gross hematuria: Principal | ICD-10-CM | POA: Diagnosis present

## 2024-01-08 DIAGNOSIS — Z86711 Personal history of pulmonary embolism: Secondary | ICD-10-CM

## 2024-01-08 DIAGNOSIS — Z881 Allergy status to other antibiotic agents status: Secondary | ICD-10-CM

## 2024-01-08 DIAGNOSIS — Z79899 Other long term (current) drug therapy: Secondary | ICD-10-CM

## 2024-01-08 DIAGNOSIS — D649 Anemia, unspecified: Secondary | ICD-10-CM | POA: Diagnosis present

## 2024-01-08 DIAGNOSIS — N135 Crossing vessel and stricture of ureter without hydronephrosis: Secondary | ICD-10-CM | POA: Diagnosis present

## 2024-01-08 DIAGNOSIS — I7121 Aneurysm of the ascending aorta, without rupture: Secondary | ICD-10-CM | POA: Diagnosis not present

## 2024-01-08 DIAGNOSIS — N1330 Unspecified hydronephrosis: Secondary | ICD-10-CM | POA: Diagnosis present

## 2024-01-08 DIAGNOSIS — Z85828 Personal history of other malignant neoplasm of skin: Secondary | ICD-10-CM

## 2024-01-08 DIAGNOSIS — N1832 Chronic kidney disease, stage 3b: Secondary | ICD-10-CM | POA: Diagnosis present

## 2024-01-08 DIAGNOSIS — Z96653 Presence of artificial knee joint, bilateral: Secondary | ICD-10-CM | POA: Diagnosis present

## 2024-01-08 DIAGNOSIS — I130 Hypertensive heart and chronic kidney disease with heart failure and stage 1 through stage 4 chronic kidney disease, or unspecified chronic kidney disease: Secondary | ICD-10-CM | POA: Diagnosis not present

## 2024-01-08 DIAGNOSIS — H9193 Unspecified hearing loss, bilateral: Secondary | ICD-10-CM | POA: Diagnosis present

## 2024-01-08 DIAGNOSIS — Z886 Allergy status to analgesic agent status: Secondary | ICD-10-CM

## 2024-01-08 DIAGNOSIS — Z87442 Personal history of urinary calculi: Secondary | ICD-10-CM

## 2024-01-08 DIAGNOSIS — N3289 Other specified disorders of bladder: Secondary | ICD-10-CM | POA: Diagnosis present

## 2024-01-08 DIAGNOSIS — Z8546 Personal history of malignant neoplasm of prostate: Secondary | ICD-10-CM

## 2024-01-08 DIAGNOSIS — Z9842 Cataract extraction status, left eye: Secondary | ICD-10-CM

## 2024-01-08 DIAGNOSIS — R319 Hematuria, unspecified: Secondary | ICD-10-CM | POA: Diagnosis not present

## 2024-01-08 DIAGNOSIS — Z8249 Family history of ischemic heart disease and other diseases of the circulatory system: Secondary | ICD-10-CM | POA: Diagnosis not present

## 2024-01-08 DIAGNOSIS — Z7984 Long term (current) use of oral hypoglycemic drugs: Secondary | ICD-10-CM

## 2024-01-08 DIAGNOSIS — Z888 Allergy status to other drugs, medicaments and biological substances status: Secondary | ICD-10-CM

## 2024-01-08 DIAGNOSIS — Z466 Encounter for fitting and adjustment of urinary device: Secondary | ICD-10-CM | POA: Diagnosis not present

## 2024-01-08 DIAGNOSIS — Z83719 Family history of colon polyps, unspecified: Secondary | ICD-10-CM

## 2024-01-08 DIAGNOSIS — I714 Abdominal aortic aneurysm, without rupture, unspecified: Secondary | ICD-10-CM | POA: Diagnosis present

## 2024-01-08 LAB — CBC
HCT: 40.4 % (ref 39.0–52.0)
Hemoglobin: 13.2 g/dL (ref 13.0–17.0)
MCH: 28.1 pg (ref 26.0–34.0)
MCHC: 32.7 g/dL (ref 30.0–36.0)
MCV: 86.1 fL (ref 80.0–100.0)
Platelets: 214 K/uL (ref 150–400)
RBC: 4.69 MIL/uL (ref 4.22–5.81)
RDW: 14.2 % (ref 11.5–15.5)
WBC: 8.4 K/uL (ref 4.0–10.5)
nRBC: 0 % (ref 0.0–0.2)

## 2024-01-08 LAB — URINALYSIS, ROUTINE W REFLEX MICROSCOPIC
Bacteria, UA: NONE SEEN
RBC / HPF: >50 RBC/hpf (ref 0–5)

## 2024-01-08 LAB — BASIC METABOLIC PANEL WITH GFR
Anion gap: 13 (ref 5–15)
BUN: 27 mg/dL — ABNORMAL HIGH (ref 8–23)
CO2: 23 mmol/L (ref 22–32)
Calcium: 8.7 mg/dL — ABNORMAL LOW (ref 8.9–10.3)
Chloride: 101 mmol/L (ref 98–111)
Creatinine, Ser: 2.84 mg/dL — ABNORMAL HIGH (ref 0.61–1.24)
GFR, Estimated: 23 mL/min — ABNORMAL LOW (ref >60)
Glucose, Bld: 132 mg/dL — ABNORMAL HIGH (ref 70–99)
Potassium: 3.6 mmol/L (ref 3.5–5.1)
Sodium: 137 mmol/L (ref 135–145)

## 2024-01-08 MED ORDER — MORPHINE SULFATE (PF) 4 MG/ML IV SOLN
4.0000 mg | Freq: Once | INTRAVENOUS | Status: AC
  Administered 2024-01-08: 4 mg via INTRAVENOUS
  Filled 2024-01-08: qty 1

## 2024-01-08 NOTE — ED Notes (Signed)
 Patient transported to CT

## 2024-01-08 NOTE — ED Notes (Signed)
 Pt trying to pee in urinal. Pt states he feels like he wants to urinate but is unable at this time.

## 2024-01-08 NOTE — ED Provider Notes (Signed)
 [place] [facility]MENT AT [facility] Provider Note   CSN: [id number] Arrival date & time: 01/08/24  2030     Patient presents with: Hematuria   Joseph Pham is a 73 y.o. male.    Hematuria     Pt states he started having hematuria today.  Pt has a nephrostomy tube but no catheter.  Pt states he has been able to urinate but now he does not feel like his bladder is emptying and was stopped up.  Pt is on xarelto  for PE (2-3 years ago).  Pt states he stopped the blood thinner for a few days prior to surgery.  Pt started back on his blood thinner on the 21st.  He has felt chilled, but did not measure a fever.    Prior to Admission medications   Medication Sig Start Date End Date Taking? Authorizing Provider  acetaminophen  (TYLENOL ) 500 MG tablet Take 1,000 mg by mouth every 8 (eight) hours as needed for moderate pain (pain score 4-6).    [provider]  amLODipine  (NORVASC ) 10 MG tablet Take 1 tablet (10 mg total) by mouth daily. 12/11/23   Krasowski, Robert J, MD  atorvastatin  (LIPITOR) 10 MG tablet Take 10 mg by mouth at bedtime.    [provider]  docusate sodium  (COLACE) 100 MG capsule Take 1 capsule (100 mg total) by mouth 2 (two) times daily. 12/16/23   Cory Palma, PA-C  fexofenadine (ALLEGRA) 180 MG tablet Take 180 mg by mouth in the morning.    [provider]  fluticasone  (FLONASE ) 50 MCG/ACT nasal spray Place 2 sprays into the nose daily as needed for allergies.     [provider]  HYDROcodone -acetaminophen  (NORCO/VICODIN) 5-325 MG tablet Take 1-2 tablets by mouth every 6 (six) hours as needed for moderate pain (pain score 4-6) or severe pain (pain score 7-10). 12/16/23   Cory Palma, PA-C  hydrocortisone 2.5 % cream Apply 1 Application topically daily as needed (ear irritation).    [provider]  JARDIANCE 10 MG TABS tablet Take 10 mg by mouth every morning.    [provider]  meclizine   (ANTIVERT ) 25 MG tablet Take 1 tablet (25 mg total) by mouth 3 (three) times daily as needed for dizziness. 12/01/23   Melvenia Motto, MD  omega-3 acid ethyl esters (LOVAZA ) 1 g capsule Take 4 g by mouth at bedtime. 05/02/20   [provider]  omeprazole (PRILOSEC) 20 MG capsule Take 20 mg by mouth in the morning and at bedtime.    [provider]  Polyethyl Glycol-Propyl Glycol (SYSTANE) 0.4-0.3 % SOLN Place 1-2 drops into both eyes 3 (three) times daily as needed (dry/irritated eyes.).    [provider]  potassium citrate (UROCIT-K) 10 MEQ (1080 MG) SR tablet Take 10 mEq by mouth in the morning. 01/26/23   [provider]  traZODone  (DESYREL ) 100 MG tablet Take 100 mg by mouth at bedtime.    [provider]    Allergies: Aspirin, Doxycycline, Amoxicillin, and Chlorhexidine  gluconate    Review of Systems  Genitourinary:  Positive for hematuria.    Updated Vital Signs BP (!) 142/88 (BP Location: Left Arm)   Pulse 87   Temp 98.2 F (36.8 C) (Oral)   Resp 16   SpO2 98%   Physical Exam Vitals and nursing note reviewed.  Constitutional:      General: He is not in acute distress.    Appearance: He is well-developed.  HENT:  Head: Normocephalic and atraumatic.     Right Ear: External ear normal.     Left Ear: External ear normal.  Eyes:     General: No scleral icterus.       Right eye: No discharge.        Left eye: No discharge.     Conjunctiva/sclera: Conjunctivae normal.  Neck:     Trachea: No tracheal deviation.  Cardiovascular:     Rate and Rhythm: Normal rate and regular rhythm.  Pulmonary:     Effort: Pulmonary effort is normal. No respiratory distress.     Breath sounds: Normal breath sounds. No stridor. No wheezing or rales.  Abdominal:     General: Bowel sounds are normal. There is no distension.     Palpations: Abdomen is soft.     Tenderness: There is no abdominal tenderness. There is no guarding or rebound.     Comments:  Capped nephrostomy tube left flank  Musculoskeletal:        General: No tenderness or deformity.     Cervical back: Neck supple.  Skin:    General: Skin is warm and dry.     Findings: No rash.  Neurological:     General: No focal deficit present.     Mental Status: He is alert.     Cranial Nerves: No cranial nerve deficit, dysarthria or facial asymmetry.     Sensory: No sensory deficit.     Motor: No abnormal muscle tone or seizure activity.     Coordination: Coordination normal.  Psychiatric:        Mood and Affect: Mood normal.     (all labs ordered are listed, but only abnormal results are displayed) Labs Reviewed  BASIC METABOLIC PANEL WITH GFR - Abnormal; Notable for the following components:      Result Value   Glucose, Bld 132 (*)    BUN 27 (*)    Creatinine, Ser 2.84 (*)    Calcium  8.7 (*)    GFR, Estimated 23 (*)    All other components within normal limits  CBC  URINALYSIS, ROUTINE W REFLEX MICROSCOPIC    EKG: None  Radiology: No results found.   Procedures   Medications Ordered in the ED - No data to display  Clinical Course as of 01/09/24 1504  Fri Jan 08, 2024  2239 CBC CBC normal.  Creatinine increased compared to previous [JK]  2251 Discussed with urology.  Does not require catheter at this time.  Would recommend renal ultrasound to assess for hydro [JK]    Clinical Course User Index [JK] Randol Simmonds, MD                                 Medical Decision Making Amount and/or Complexity of Data Reviewed Labs:  Decision-making details documented in ED Course. Radiology: ordered.  Risk Prescription drug management. Decision regarding hospitalization.   Pt presented to the ED for hematuria.  Pt had ureteral surgery a couple of weeks ago.  Pt is on anticoagulation.  No anemia noted but cr increased compared to prior.  DIscussed case with urology.  Plan on CT scan.   Case turned over with Dr Raford.  Plan on review with urology once ct results  are back.     Final diagnoses:  Gross hematuria  Acute kidney injury (nontraumatic) (HCC)  Chronic anticoagulation    ED Discharge Orders     None  Randol Simmonds, MD 01/09/24 [phone]

## 2024-01-08 NOTE — ED Provider Notes (Signed)
 Care assumed from Dr. Randol, patient with gross hematuria pending CT scan results. He also has an AKI. Urology wants to be contacted with results.  CT scan shows right-sided hydronephrosis, probable clot within the bladder.  I have independently reviewed the images, and agree with the radiologist's interpretation.  Patient is continuing to have gross hematuria.  I have discussed the case with Dr. Jesusa of urology service who has reviewed his labs and CT scan and recommends admission.  I have discussed the case with Dr. Shona of [facility], who agrees to admit the patient.  Results for orders placed or performed during the hospital encounter of 01/08/24  Basic metabolic panel   Collection Time: 01/08/24  8:54 PM  Result Value Ref Range   Sodium 137 135 - 145 mmol/L   Potassium 3.6 3.5 - 5.1 mmol/L   Chloride 101 98 - 111 mmol/L   CO2 23 22 - 32 mmol/L   Glucose, Bld 132 (H) 70 - 99 mg/dL   BUN 27 (H) 8 - 23 mg/dL   Creatinine, Ser 7.15 (H) 0.61 - 1.24 mg/dL   Calcium  8.7 (L) 8.9 - 10.3 mg/dL   GFR, Estimated 23 (L) >60 mL/min   Anion gap 13 5 - 15  CBC   Collection Time: 01/08/24  8:54 PM  Result Value Ref Range   WBC 8.4 4.0 - 10.5 K/uL   RBC 4.69 4.22 - 5.81 MIL/uL   Hemoglobin 13.2 13.0 - 17.0 g/dL   HCT 59.5 60.9 - 47.9 %   MCV 86.1 80.0 - 100.0 fL   MCH 28.1 26.0 - 34.0 pg   MCHC 32.7 30.0 - 36.0 g/dL   RDW 85.7 88.4 - 84.4 %   Platelets 214 150 - 400 K/uL   nRBC 0.0 0.0 - 0.2 %  Urinalysis, Routine w reflex microscopic -Urine, Clean Catch   Collection Time: 01/08/24  9:02 PM  Result Value Ref Range   Color, Urine RED (A) YELLOW   APPearance TURBID (A) CLEAR   Specific Gravity, Urine  1.005 - 1.030    TEST NOT REPORTED DUE TO COLOR INTERFERENCE OF URINE PIGMENT   pH  5.0 - 8.0    TEST NOT REPORTED DUE TO COLOR INTERFERENCE OF URINE PIGMENT   Glucose, UA (A) NEGATIVE mg/dL    TEST NOT REPORTED DUE TO COLOR INTERFERENCE OF URINE PIGMENT   Hgb urine dipstick (A)  NEGATIVE    TEST NOT REPORTED DUE TO COLOR INTERFERENCE OF URINE PIGMENT   Bilirubin Urine (A) NEGATIVE    TEST NOT REPORTED DUE TO COLOR INTERFERENCE OF URINE PIGMENT   Ketones, ur (A) NEGATIVE mg/dL    TEST NOT REPORTED DUE TO COLOR INTERFERENCE OF URINE PIGMENT   Protein, ur (A) NEGATIVE mg/dL    TEST NOT REPORTED DUE TO COLOR INTERFERENCE OF URINE PIGMENT   Nitrite (A) NEGATIVE    TEST NOT REPORTED DUE TO COLOR INTERFERENCE OF URINE PIGMENT   Leukocytes,Ua (A) NEGATIVE    TEST NOT REPORTED DUE TO COLOR INTERFERENCE OF URINE PIGMENT   RBC / HPF >50 0 - 5 RBC/hpf   WBC, UA 0-5 0 - 5 WBC/hpf   Bacteria, UA NONE SEEN NONE SEEN   Squamous Epithelial / HPF 0-5 0 - 5 /HPF   CT ABDOMEN PELVIS WO CONTRAST Result Date: 01/09/2024 CLINICAL DATA:  Hematuria, known left-sided nephrostomy catheter EXAM: CT ABDOMEN AND PELVIS WITHOUT CONTRAST TECHNIQUE: Multidetector CT imaging of the abdomen and pelvis was performed following the standard protocol  without IV contrast. RADIATION DOSE REDUCTION: This exam was performed according to the departmental dose-optimization program which includes automated exposure control, adjustment of the mA and/or kV according to patient size and/or use of iterative reconstruction technique. COMPARISON:  12/01/2023 FINDINGS: Lower chest: Stable scarring is noted in the left base. No effusion is seen. Hepatobiliary: No focal liver abnormality is seen. No gallstones, gallbladder wall thickening, or biliary dilatation. Pancreas: Unremarkable. No pancreatic ductal dilatation or surrounding inflammatory changes. Spleen: Normal in size without focal abnormality. Adrenals/Urinary Tract: Adrenal glands are within normal limits. Left kidney demonstrates some cortical thinning. Left-sided nephrostomy catheter and ureteral stent are noted in satisfactory position. Cortical stone is again noted adjacent to the left nephrostomy catheter stable from the prior exam. Ureteral stent is noted on the  left. The ureter appears to be have been reimplanted on the left. The right kidney shows new hydronephrosis. No definitive stone is identified. These changes may be related to the ongoing hematuria and thrombus within the bladder. Stomach/Bowel: The appendix is within normal limits. No obstructive or inflammatory changes of the colon are noted. Small bowel and stomach are within normal limits. Vascular/Lymphatic: Aortic atherosclerosis. No enlarged abdominal or pelvic lymph nodes. Reproductive: Brachytherapy seeds are noted within the prostate. Other: No abdominal wall hernia or abnormality. No abdominopelvic ascites. Musculoskeletal: Postsurgical changes are noted in the lower lumbar spine. IMPRESSION: Persistent left nephrostomy catheter with stone adjacent within the renal cortex of the left kidney. New left ureteral stent has been placed and there are changes consistent with reimplantation of the distal left ureter. No obstructive changes are seen. New moderate right-sided hydronephrosis and hydroureter without definitive obstructing stone. This may be related to thrombus within the bladder consistent with the given clinical history of hematuria. Electronically Signed   By: Oneil Devonshire M.D.   On: 01/09/2024 00:13   IR NEPHROSTOMY PLACEMENT LEFT Result Date: 12/14/2023 INDICATION: Left nephrostomy inadvertently removed EXAM: FLUOROSCOPIC REPLACEMENT OF THE 10 FRENCH LEFT NEPHROSTOMY Date:  12/14/2023 12/14/2023 1:54 pm Radiologist:  CHRISTELLA. Frederic Specking, MD Guidance:  Fluoroscopic FLUOROSCOPY: 1.0 minutes 24 seconds (36 mGy). MEDICATIONS: 1% lidocaine  local ANESTHESIA/SEDATION: None. CONTRAST:  15mL OMNIPAQUE  IOHEXOL  300 MG/ML  SOLN COMPLICATIONS: None immediate. PROCEDURE: Informed consent was obtained from the patient following explanation of the procedure, risks, benefits and alternatives. The patient understands, agrees and consents for the procedure. All questions were addressed. A time out was performed.  Maximal barrier sterile technique utilized including caps, mask, sterile gowns, sterile gloves, large sterile drape, hand hygiene, and ChloraPrep. Under sterile conditions, the existing percutaneous nephrostomy tract was cannulated easily with a Kumpe catheter. Contrast injection confirms position in the renal pelvis. Mild hydronephrosis noted. Amplatz guidewire inserted. Tract dilatation performed to insert a 10 [place] drain. Retention loop formed the renal pelvis. Position confirmed with contrast injection. Images obtained for documentation. Catheter secured with a silk suture and a sterile dressing. Gravity drainage bag connected. No immediate complication. Patient tolerated the procedure well. IMPRESSION: Successful replacement of the 10 French left nephrostomy. Electronically Signed   By: CHRISTELLA.  Shick M.D.   On: 12/14/2023 14:22      Raford Lenis, MD 01/09/24 [phone]

## 2024-01-08 NOTE — ED Triage Notes (Signed)
 Pt. Arrives pov for hematuria for over a week. Bleeding worsening today, also having blood clots. Pt. Is taking a blood thinners. Denies pain. Reports weakness and nausea.

## 2024-01-09 DIAGNOSIS — N3289 Other specified disorders of bladder: Secondary | ICD-10-CM | POA: Diagnosis present

## 2024-01-09 DIAGNOSIS — Z8249 Family history of ischemic heart disease and other diseases of the circulatory system: Secondary | ICD-10-CM | POA: Diagnosis not present

## 2024-01-09 DIAGNOSIS — E1122 Type 2 diabetes mellitus with diabetic chronic kidney disease: Secondary | ICD-10-CM | POA: Diagnosis present

## 2024-01-09 DIAGNOSIS — E785 Hyperlipidemia, unspecified: Secondary | ICD-10-CM | POA: Diagnosis present

## 2024-01-09 DIAGNOSIS — Z86711 Personal history of pulmonary embolism: Secondary | ICD-10-CM | POA: Diagnosis not present

## 2024-01-09 DIAGNOSIS — G4733 Obstructive sleep apnea (adult) (pediatric): Secondary | ICD-10-CM | POA: Diagnosis present

## 2024-01-09 DIAGNOSIS — Z8546 Personal history of malignant neoplasm of prostate: Secondary | ICD-10-CM | POA: Diagnosis not present

## 2024-01-09 DIAGNOSIS — R310 Gross hematuria: Secondary | ICD-10-CM | POA: Diagnosis not present

## 2024-01-09 DIAGNOSIS — Z86718 Personal history of other venous thrombosis and embolism: Secondary | ICD-10-CM | POA: Diagnosis not present

## 2024-01-09 DIAGNOSIS — Z880 Allergy status to penicillin: Secondary | ICD-10-CM | POA: Diagnosis not present

## 2024-01-09 DIAGNOSIS — N1330 Unspecified hydronephrosis: Secondary | ICD-10-CM | POA: Diagnosis present

## 2024-01-09 DIAGNOSIS — Z936 Other artificial openings of urinary tract status: Secondary | ICD-10-CM | POA: Diagnosis not present

## 2024-01-09 DIAGNOSIS — Z96653 Presence of artificial knee joint, bilateral: Secondary | ICD-10-CM | POA: Diagnosis present

## 2024-01-09 DIAGNOSIS — Z881 Allergy status to other antibiotic agents status: Secondary | ICD-10-CM | POA: Diagnosis not present

## 2024-01-09 DIAGNOSIS — N179 Acute kidney failure, unspecified: Secondary | ICD-10-CM | POA: Diagnosis present

## 2024-01-09 DIAGNOSIS — D649 Anemia, unspecified: Secondary | ICD-10-CM | POA: Diagnosis present

## 2024-01-09 DIAGNOSIS — Z7984 Long term (current) use of oral hypoglycemic drugs: Secondary | ICD-10-CM | POA: Diagnosis not present

## 2024-01-09 DIAGNOSIS — Z85828 Personal history of other malignant neoplasm of skin: Secondary | ICD-10-CM | POA: Diagnosis not present

## 2024-01-09 DIAGNOSIS — I7121 Aneurysm of the ascending aorta, without rupture: Secondary | ICD-10-CM | POA: Diagnosis present

## 2024-01-09 DIAGNOSIS — I5032 Chronic diastolic (congestive) heart failure: Secondary | ICD-10-CM | POA: Diagnosis present

## 2024-01-09 DIAGNOSIS — Z7901 Long term (current) use of anticoagulants: Secondary | ICD-10-CM | POA: Diagnosis not present

## 2024-01-09 DIAGNOSIS — N1832 Chronic kidney disease, stage 3b: Secondary | ICD-10-CM | POA: Diagnosis present

## 2024-01-09 DIAGNOSIS — Z886 Allergy status to analgesic agent status: Secondary | ICD-10-CM | POA: Diagnosis not present

## 2024-01-09 DIAGNOSIS — I130 Hypertensive heart and chronic kidney disease with heart failure and stage 1 through stage 4 chronic kidney disease, or unspecified chronic kidney disease: Secondary | ICD-10-CM | POA: Diagnosis present

## 2024-01-09 LAB — BASIC METABOLIC PANEL WITH GFR
Anion gap: 12 (ref 5–15)
BUN: 31 mg/dL — ABNORMAL HIGH (ref 8–23)
CO2: 23 mmol/L (ref 22–32)
Calcium: 8.4 mg/dL — ABNORMAL LOW (ref 8.9–10.3)
Chloride: 102 mmol/L (ref 98–111)
Creatinine, Ser: 2.58 mg/dL — ABNORMAL HIGH (ref 0.61–1.24)
GFR, Estimated: 26 mL/min — ABNORMAL LOW (ref >60)
Glucose, Bld: 169 mg/dL — ABNORMAL HIGH (ref 70–99)
Potassium: 3.6 mmol/L (ref 3.5–5.1)
Sodium: 137 mmol/L (ref 135–145)

## 2024-01-09 LAB — CBC
HCT: 36.6 % — ABNORMAL LOW (ref 39.0–52.0)
Hemoglobin: 12.1 g/dL — ABNORMAL LOW (ref 13.0–17.0)
MCH: 28.8 pg (ref 26.0–34.0)
MCHC: 33.1 g/dL (ref 30.0–36.0)
MCV: 87.1 fL (ref 80.0–100.0)
Platelets: 191 K/uL (ref 150–400)
RBC: 4.2 MIL/uL — ABNORMAL LOW (ref 4.22–5.81)
RDW: 14.2 % (ref 11.5–15.5)
WBC: 7.8 K/uL (ref 4.0–10.5)
nRBC: 0 % (ref 0.0–0.2)

## 2024-01-09 LAB — HEMOGLOBIN AND HEMATOCRIT, BLOOD
HCT: 37.9 % — ABNORMAL LOW (ref 39.0–52.0)
HCT: 38.7 % — ABNORMAL LOW (ref 39.0–52.0)
Hemoglobin: 12.4 g/dL — ABNORMAL LOW (ref 13.0–17.0)
Hemoglobin: 12.8 g/dL — ABNORMAL LOW (ref 13.0–17.0)

## 2024-01-09 LAB — PHOSPHORUS: Phosphorus: 3.9 mg/dL (ref 2.5–4.6)

## 2024-01-09 LAB — MRSA NEXT GEN BY PCR, NASAL: MRSA by PCR Next Gen: NOT DETECTED

## 2024-01-09 LAB — GLUCOSE, CAPILLARY
Glucose-Capillary: 127 mg/dL — ABNORMAL HIGH (ref 70–99)
Glucose-Capillary: 138 mg/dL — ABNORMAL HIGH (ref 70–99)

## 2024-01-09 LAB — MAGNESIUM: Magnesium: 1.9 mg/dL (ref 1.7–2.4)

## 2024-01-09 MED ORDER — SODIUM CHLORIDE 0.9 % IR SOLN
3000.0000 mL | Status: DC
  Administered 2024-01-09: 3000 mL

## 2024-01-09 MED ORDER — BISACODYL 10 MG RE SUPP
10.0000 mg | Freq: Once | RECTAL | Status: AC
  Administered 2024-01-09: 10 mg via RECTAL
  Filled 2024-01-09: qty 1

## 2024-01-09 MED ORDER — LIDOCAINE HCL URETHRAL/MUCOSAL 2 % EX GEL
1.0000 | Freq: Once | CUTANEOUS | Status: AC
  Administered 2024-01-09: 1 via URETHRAL
  Filled 2024-01-09: qty 11

## 2024-01-09 MED ORDER — OXYCODONE HCL 5 MG PO TABS
5.0000 mg | ORAL_TABLET | Freq: Four times a day (QID) | ORAL | Status: DC | PRN
  Administered 2024-01-09 (×2): 5 mg via ORAL
  Filled 2024-01-09 (×2): qty 1

## 2024-01-09 MED ORDER — POLYETHYLENE GLYCOL 3350 17 G PO PACK
17.0000 g | PACK | Freq: Every day | ORAL | Status: DC | PRN
  Administered 2024-01-09: 17 g via ORAL
  Filled 2024-01-09: qty 1

## 2024-01-09 MED ORDER — PROCHLORPERAZINE EDISYLATE 10 MG/2ML IJ SOLN
5.0000 mg | Freq: Four times a day (QID) | INTRAMUSCULAR | Status: DC | PRN
  Administered 2024-01-09: 5 mg via INTRAVENOUS
  Filled 2024-01-09: qty 2

## 2024-01-09 MED ORDER — ACETAMINOPHEN 325 MG PO TABS
650.0000 mg | ORAL_TABLET | Freq: Four times a day (QID) | ORAL | Status: DC | PRN
  Administered 2024-01-09 (×2): 650 mg via ORAL
  Filled 2024-01-09 (×2): qty 2

## 2024-01-09 MED ORDER — INSULIN ASPART 100 UNIT/ML IJ SOLN
0.0000 [IU] | Freq: Three times a day (TID) | INTRAMUSCULAR | Status: DC
  Administered 2024-01-09: 2 [IU] via SUBCUTANEOUS
  Administered 2024-01-10: 5 [IU] via SUBCUTANEOUS
  Administered 2024-01-10: 3 [IU] via SUBCUTANEOUS

## 2024-01-09 MED ORDER — HYOSCYAMINE SULFATE 0.125 MG PO TBDP
0.1250 mg | ORAL_TABLET | Freq: Four times a day (QID) | ORAL | Status: DC | PRN
  Administered 2024-01-09: 0.125 mg via SUBLINGUAL
  Filled 2024-01-09 (×2): qty 1

## 2024-01-09 MED ORDER — HYDROMORPHONE HCL 1 MG/ML IJ SOLN
0.5000 mg | INTRAMUSCULAR | Status: DC | PRN
  Administered 2024-01-09: 0.5 mg via INTRAVENOUS
  Filled 2024-01-09: qty 1

## 2024-01-09 MED ORDER — SENNOSIDES-DOCUSATE SODIUM 8.6-50 MG PO TABS
1.0000 | ORAL_TABLET | Freq: Two times a day (BID) | ORAL | Status: DC
  Administered 2024-01-09 – 2024-01-10 (×3): 1 via ORAL
  Filled 2024-01-09 (×3): qty 1

## 2024-01-09 MED ORDER — MELATONIN 5 MG PO TABS: 5.0000 mg | ORAL_TABLET | Freq: Every evening | ORAL | Status: DC | PRN

## 2024-01-09 MED ORDER — TRAZODONE HCL 100 MG PO TABS
100.0000 mg | ORAL_TABLET | Freq: Every day | ORAL | Status: DC
  Administered 2024-01-09: 100 mg via ORAL
  Filled 2024-01-09: qty 1

## 2024-01-09 MED ORDER — AMLODIPINE BESYLATE 10 MG PO TABS
10.0000 mg | ORAL_TABLET | Freq: Every day | ORAL | Status: DC
  Administered 2024-01-09 – 2024-01-10 (×2): 10 mg via ORAL
  Filled 2024-01-09 (×2): qty 1

## 2024-01-09 MED ORDER — POLYETHYLENE GLYCOL 3350 17 G PO PACK
17.0000 g | PACK | Freq: Every day | ORAL | Status: DC
  Administered 2024-01-10: 17 g via ORAL
  Filled 2024-01-09: qty 1

## 2024-01-09 MED ORDER — ATORVASTATIN CALCIUM 10 MG PO TABS
10.0000 mg | ORAL_TABLET | Freq: Every day | ORAL | Status: DC
  Administered 2024-01-09: 10 mg via ORAL
  Filled 2024-01-09: qty 1

## 2024-01-09 MED ORDER — PNEUMOCOCCAL 20-VAL CONJ VACC 0.5 ML IM SUSY: 0.5000 mL | PREFILLED_SYRINGE | INTRAMUSCULAR | Status: DC | PRN

## 2024-01-09 MED ORDER — PANTOPRAZOLE SODIUM 40 MG PO TBEC
40.0000 mg | DELAYED_RELEASE_TABLET | Freq: Two times a day (BID) | ORAL | Status: DC
  Administered 2024-01-09 – 2024-01-10 (×3): 40 mg via ORAL
  Filled 2024-01-09 (×3): qty 1

## 2024-01-09 MED ORDER — LORATADINE 10 MG PO TABS
10.0000 mg | ORAL_TABLET | Freq: Every day | ORAL | Status: DC
  Administered 2024-01-09 – 2024-01-10 (×2): 10 mg via ORAL
  Filled 2024-01-09 (×2): qty 1

## 2024-01-09 NOTE — Plan of Care (Signed)

## 2024-01-09 NOTE — Plan of Care (Signed)
  Problem: Clinical Measurements: Goal: Cardiovascular complication will be avoided Outcome: Progressing   Problem: Clinical Measurements: Goal: Respiratory complications will improve Outcome: Progressing   Problem: Activity: Goal: Risk for activity intolerance will decrease Outcome: Progressing

## 2024-01-09 NOTE — H&P (Addendum)
 History and Physical  Joseph Pham FMW:[id number] DOB: [birth year] DOA: 01/08/2024  Referring physician: Dr. Raford, EDP  PCP: Conley Dene BROCKS, DO  Outpatient Specialists: Urology, cardiology Patient coming from: Home  Chief Complaint: Bloody urine   HPI: Joseph Pham is a 73 y.o. male with medical history significant for DVT and PE on Xarelto , high-grade left ureteral stricture status post left ureteral reimplantation on 12/16/2023 by Dr. Alvaro, OSA on CPAP, AAA, CKD 3B, who presents to the ER due to gross hematuria x 1 day.  No prior history of gross hematuria.  Associated with lower abdominal spasms.  Denies any subjective fevers however admits to chills.  The patient is on Xarelto  and has been compliant with it.  In the ER, uncomfortable appearing due to lower abdominal spasms with ongoing gross hematuria.  Hemoglobin on presentation 13.2 with serial H&H underway.  UA negative for pyuria.  Creatinine elevated, greater than baseline.  EDP discussed the case with urology who will see the patient in consultation.  TR[facility], hospitalist service, was asked to admit.  ED Course: Temperature 98.4.  BP 110/78, pulse 68, respiration rate 14.  O2 saturation 95% on room air.  Lab studies notable for BUN 27, creatinine 2.84 from baseline of 1.9.  Review of Systems: Review of systems as noted in the HPI. All other systems reviewed and are negative.   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   Complication of anesthesia    low heart rate during lithotripsy procedure    Deviated septum 07/19/2019   Diabetes (HCC) 03/16/2017   Essential hypertension 10/14/2015   Family history of adverse reaction to anesthesia    sister coded after surgery   GERD (gastroesophageal reflux disease)    Heart murmur    Hematuria    Hemorrhoid    History of kidney stones    Hyperlipidemia    Impingement of left ankle joint 03/06/2021   Kidney stone 03/16/2017   Lumbar radiculopathy 03/16/2017   Nasal turbinate hypertrophy 07/19/2019   OSA on CPAP    Paronychia 08/16/2015   PE (pulmonary thromboembolism) (HCC) 02/2019   Posterior tibialis tendinitis of both lower extremities 02/02/2020   Pronation deformity of both feet 02/02/2020   Prostate cancer (HCC) 03/16/2017   Skin cancer    Tibialis posterior tendinitis 02/11/2021   Tinnitus, bilateral 08/31/2019   UTI (urinary tract infection) 04/09/2022   Wears glasses    Past Surgical History:  Procedure Laterality Date   BACK SURGERY  2019   rod and screws    BILATERAL INGUINAL HERNIA REPAIR  1989   CARDIAC CATHETERIZATION  01/11/2004   NO EVIDENCE SIGNIFICANT EPICARDIAL FLOW LIMITING 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   CARPAL TUNNEL RELEASE Left 06/2016   CARPAL TUNNEL RELEASE Right 03/2017   CATARACT EXTRACTION, BILATERAL Bilateral 2005   COLONOSCOPY     last with medoff unsure when    CYSTOSCOPY WITH RETROGRADE PYELOGRAM, URETEROSCOPY AND STENT PLACEMENT Left 04/09/2022   Procedure: CYSTOSCOPY WITH  RETROGRADE PYELOGRAM AND STENT PLACEMENT;  Surgeon: Carolee Sherwood JONETTA DOUGLAS, MD;  Location: [facility];  Service: Urology;  Laterality: Left;   CYSTOSCOPY WITH RETROGRADE PYELOGRAM, URETEROSCOPY AND STENT PLACEMENT Left 05/04/2022   Procedure: CYSTOSCOPY WITH RETROGRADE PYELOGRAM, URETEROSCOPY AND STENT PLACEMENT;  Surgeon: Devere Lonni Righter, MD;  Location: [facility];  Service: Urology;  Laterality: Left;   CYSTOSCOPY WITH URETEROSCOPY AND STENT PLACEMENT Right 10/28/2013   Procedure: RIGHT  URETEROSCOPY AND STENT PLACEMENT;  Surgeon: Mark C Ottelin, MD;  Location: [facility];  Service: Urology;  Laterality: Right;   CYSTOSCOPY/URETEROSCOPY/HOLMIUM LASER/STENT PLACEMENT Left 09/06/2020   Procedure: CYSTOSCOPY/POSSIBLE URETEROSCOPY/HOLMIUM LASER/STENT PLACEMENT;  Surgeon: Rosalind Zachary NOVAK, MD;  Location: [facility];  Service: Urology;  Laterality: Left;  30 MINS   CYSTOSCOPY/URETEROSCOPY/HOLMIUM LASER/STENT PLACEMENT Left 04/22/2022   Procedure: CYSTOSCOPY/URETEROSCOPY/HOLMIUM LASER/STENT REPLACEMENT;  Surgeon: Rosalind Zachary NOVAK, MD;  Location: [facility];  Service: Urology;  Laterality: Left;  1 HR   CYSTOSCOPY/URETEROSCOPY/HOLMIUM LASER/STENT PLACEMENT Left 05/20/2022   Procedure: CYSTOSCOPY/URETEROSCOPY/HOLMIUM LASER/STENT EXCHANGE;  Surgeon: Rosalind Zachary NOVAK, MD;  Location: [facility];  Service: Urology;  Laterality: Left;   CYSTOSCOPY/URETEROSCOPY/HOLMIUM LASER/STENT PLACEMENT Left 07/24/2023   Procedure: DIAGNOSTIC URETEROSCOPY;  Surgeon: Selma Donnice SAUNDERS, MD;  Location: [facility];  Service: Urology;  Laterality: Left;  60 MINUTES NEEDED FOR CASE   EXTRACORPOREAL SHOCK WAVE LITHOTRIPSY  02/08/2009   HOLMIUM LASER APPLICATION Right 10/28/2013   Procedure: HOLMIUM LASER LITHO;  Surgeon: Oneil JAYSON Rafter, MD;  Location: [facility];  Service: Urology;  Laterality: Right;   IR CONVERT LEFT NEPHROSTOMY TO NEPHROURETERAL CATH  09/08/2023   IR NEPHROSTOGRAM LEFT THRU EXISTING ACCESS  09/01/2023   IR NEPHROSTOMY EXCHANGE LEFT  09/08/2023   IR NEPHROSTOMY EXCHANGE LEFT  11/03/2023   IR NEPHROSTOMY PLACEMENT LEFT  08/26/2023   IR NEPHROSTOMY PLACEMENT LEFT  12/14/2023   LEFT KNEE SURG.  1993  &  NOV 2011   LEFT URETEROSCOPIC STONE EXTRACTION  02/23/2009   POLYPECTOMY  2014   1 polyp per dr ira notes- no surg path to determine polyps type but was a 5 yr recall    RADIOACTIVE SEED IMPLANT  08/08/2011   Procedure: RADIOACTIVE SEED IMPLANT;  Surgeon: Oneil JAYSON Rafter, MD;  Location:  [facility];  Service: Urology;  Laterality: N/A;  seeds implanted 50 seeds found in bladder=none    REPLACEMENT TOTAL KNEE Left 2018   SKIN CANCER EXCISION     Burned off left side of nose, and head   TOTAL KNEE ARTHROPLASTY Right 08/2018   UMBILICAL HERNIA REPAIR N/A 12/16/2023   Procedure: REPAIR, HERNIA, UMBILICAL, ADULT;  Surgeon: Alvaro Ricardo NOVAK Mickey., MD;  Location: [facility];  Service: Urology;  Laterality: N/A;   UPPER GASTROINTESTINAL ENDOSCOPY     URETEROSCOPY  03/15/2012   Procedure: URETEROSCOPY;  Surgeon: Oneil JAYSON Rafter, MD;  Location: [facility];  Service: Urology;  Laterality: Right;  Right Ueteroscopy     Social History:  reports that he quit smoking about 30 years ago. His smoking use included cigarettes. He started smoking about 40 years ago. He has quit using smokeless tobacco.  His smokeless tobacco use included chew. He reports that he does not drink alcohol and does not use drugs.   Allergies  Allergen Reactions   Aspirin Anaphylaxis   Doxycycline Nausea And Vomiting   Amoxicillin Itching   Chlorhexidine  Gluconate Rash    Family History  Problem Relation Age of Onset   Cancer Mother  Diabetes Father    CAD Father    Heart attack Father    Diabetes Sister    Colon polyps Sister    Colon polyps Brother    Colon cancer Neg Hx    Esophageal cancer Neg Hx    Rectal cancer Neg Hx    Stomach cancer Neg Hx       Prior to Admission medications   Medication Sig Start Date End Date Taking? Authorizing Provider  acetaminophen  (TYLENOL ) 500 MG tablet Take 1,000 mg by mouth every 8 (eight) hours as needed for moderate pain (pain score 4-6).    [provider]  amLODipine  (NORVASC ) 10 MG tablet Take 1 tablet (10 mg total) by mouth daily. 12/11/23   Krasowski, Robert J, MD  atorvastatin  (LIPITOR) 10 MG tablet Take 10 mg by mouth at bedtime.    [provider]  docusate sodium  (COLACE) 100 MG capsule Take 1 capsule (100  mg total) by mouth 2 (two) times daily. 12/16/23   Cory Palma, PA-C  fexofenadine (ALLEGRA) 180 MG tablet Take 180 mg by mouth in the morning.    [provider]  fluticasone  (FLONASE ) 50 MCG/ACT nasal spray Place 2 sprays into the nose daily as needed for allergies.     [provider]  HYDROcodone -acetaminophen  (NORCO/VICODIN) 5-325 MG tablet Take 1-2 tablets by mouth every 6 (six) hours as needed for moderate pain (pain score 4-6) or severe pain (pain score 7-10). 12/16/23   Cory Palma, PA-C  hydrocortisone 2.5 % cream Apply 1 Application topically daily as needed (ear irritation).    [provider]  JARDIANCE 10 MG TABS tablet Take 10 mg by mouth every morning.    [provider]  meclizine  (ANTIVERT ) 25 MG tablet Take 1 tablet (25 mg total) by mouth 3 (three) times daily as needed for dizziness. 12/01/23   Melvenia Motto, MD  omega-3 acid ethyl esters (LOVAZA ) 1 g capsule Take 4 g by mouth at bedtime. 05/02/20   [provider]  omeprazole (PRILOSEC) 20 MG capsule Take 20 mg by mouth in the morning and at bedtime.    [provider]  Polyethyl Glycol-Propyl Glycol (SYSTANE) 0.4-0.3 % SOLN Place 1-2 drops into both eyes 3 (three) times daily as needed (dry/irritated eyes.).    [provider]  potassium citrate (UROCIT-K) 10 MEQ (1080 MG) SR tablet Take 10 mEq by mouth in the morning. 01/26/23   [provider]  traZODone  (DESYREL ) 100 MG tablet Take 100 mg by mouth at bedtime.    [provider]    Physical Exam: BP 119/79 (BP Location: Right Arm)   Pulse 74   Temp 98.4 F (36.9 C) (Oral)   Resp 16   SpO2 95%   General: 73 y.o. year-old male well developed well nourished in no acute distress.  Alert and oriented x3. Cardiovascular: Regular rate and rhythm with no rubs or gallops.  No thyromegaly or JVD noted.  No lower extremity edema. 2/4 pulses in all 4 extremities. Respiratory: Clear to auscultation with no  wheezes or rales. Good inspiratory effort. Abdomen: Soft nontender nondistended with normal bowel sounds x4 quadrants.  Left ureteral tube in place. Muskuloskeletal: No cyanosis, clubbing or edema noted bilaterally Neuro: CN II-XII intact, strength, sensation, reflexes Skin: No ulcerative lesions noted or rashes Psychiatry: Judgement and insight appear normal. Mood is appropriate for condition and setting          Labs on Admission:  Basic Metabolic Panel: Recent Labs  Lab  01/08/24 2054  NA 137  K 3.6  CL 101  CO2 23  GLUCOSE 132*  BUN 27*  CREATININE 2.84*  CALCIUM  8.7*   Liver Function Tests: No results for input(s): AST, ALT, ALKPHOS, BILITOT, PROT, ALBUMIN in the last 168 hours. No results for input(s): LIPASE, AMYLASE in the last 168 hours. No results for input(s): AMMONIA in the last 168 hours. CBC: Recent Labs  Lab 01/08/24 2054  WBC 8.4  HGB 13.2  HCT 40.4  MCV 86.1  PLT 214   Cardiac Enzymes: No results for input(s): CKTOTAL, CKMB, CKMBINDEX, TROPONINI in the last 168 hours.  BNP (last 3 results) Recent Labs    12/01/23 0127  BNP 30.7    ProBNP (last 3 results) Recent Labs    10/09/23 0810 10/13/23 0914  PROBNP 142 162    CBG: No results for input(s): GLUCAP in the last 168 hours.  Radiological Exams on Admission: CT ABDOMEN PELVIS WO CONTRAST Result Date: 01/09/2024 CLINICAL DATA:  Hematuria, known left-sided nephrostomy catheter EXAM: CT ABDOMEN AND PELVIS WITHOUT CONTRAST TECHNIQUE: Multidetector CT imaging of the abdomen and pelvis was performed following the standard protocol without IV contrast. RADIATION DOSE REDUCTION: This exam was performed according to the departmental dose-optimization program which includes automated exposure control, adjustment of the mA and/or kV according to patient size and/or use of iterative reconstruction technique. COMPARISON:  12/01/2023 FINDINGS: Lower chest: Stable scarring is  noted in the left base. No effusion is seen. Hepatobiliary: No focal liver abnormality is seen. No gallstones, gallbladder wall thickening, or biliary dilatation. Pancreas: Unremarkable. No pancreatic ductal dilatation or surrounding inflammatory changes. Spleen: Normal in size without focal abnormality. Adrenals/Urinary Tract: Adrenal glands are within normal limits. Left kidney demonstrates some cortical thinning. Left-sided nephrostomy catheter and ureteral stent are noted in satisfactory position. Cortical stone is again noted adjacent to the left nephrostomy catheter stable from the prior exam. Ureteral stent is noted on the left. The ureter appears to be have been reimplanted on the left. The right kidney shows new hydronephrosis. No definitive stone is identified. These changes may be related to the ongoing hematuria and thrombus within the bladder. Stomach/Bowel: The appendix is within normal limits. No obstructive or inflammatory changes of the colon are noted. Small bowel and stomach are within normal limits. Vascular/Lymphatic: Aortic atherosclerosis. No enlarged abdominal or pelvic lymph nodes. Reproductive: Brachytherapy seeds are noted within the prostate. Other: No abdominal wall hernia or abnormality. No abdominopelvic ascites. Musculoskeletal: Postsurgical changes are noted in the lower lumbar spine. IMPRESSION: Persistent left nephrostomy catheter with stone adjacent within the renal cortex of the left kidney. New left ureteral stent has been placed and there are changes consistent with reimplantation of the distal left ureter. No obstructive changes are seen. New moderate right-sided hydronephrosis and hydroureter without definitive obstructing stone. This may be related to thrombus within the bladder consistent with the given clinical history of hematuria. Electronically Signed   By: Oneil Devonshire M.D.   On: 01/09/2024 00:13    EKG: I independently viewed the EKG done and my findings are as  followed: None available at the time of this visit.  Assessment/Plan Present on Admission:  Gross hematuria  Principal Problem:   Gross hematuria  Gross hematuria with clots on DOAC for history of DVT/PE UA negative for pyuria CT abdomen pelvis without contrast revealed persistent left nephrostomy catheter with stone adjacent within the renal cortex of the left kidney.  New left ureteral stent has been placed and there  are changes consistent with reimplantation of the distal left ureter.  No obstructive changes are seen.  New moderate right-sided hydronephrosis and hydroureter without definitive obstructing stone.  This may be related to thrombus within the bladder consistent with a given clinical history of hematuria. Continue bladder irrigation until seen by urology. Closely monitor vital signs, and maintain MAP greater than 65.  Acute blood loss in the setting of gross hematuria Ongoing gross hematuria, continue fall precautions Serial H&H every 6 hours x 3 Transfuse PRBCs as indicated; patient consented to blood transfusion if it is needed.  AKI on CKD 3B Baseline creatinine appears to be 1.92 with GFR 37 Presented with creatinine of 2.84 with GFR 23 Avoid nephrotoxic agents, dehydration and hypotension. Monitor urine output Repeat BMP in the morning.  High-grade left ureteral stricture status post left ureteral reimplantation on 12/16/2023 by Dr. Alvaro Management per urology.  New moderate right-sided hydronephrosis and hydroureter without definitive obstructing stone, seen on CT scan.  Plan for Foley catheter placement with bladder irrigation starting 01/09/24  OSA on CPAP Resume CPAP nightly  Ascending aortic aneurysm, measuring 43 mm, seen on 2D echo 11/05/2023 Denies any cardiopulmonary symptoms. Outpatient follow up  Chronic HFpEF Last 2D echo done on 11/05/2023 revealed LVEF 65% with grade 1 diastolic dysfunction. Euvolemic on exam Monitor strict I's and O's and daily  weight.   Critical care time: 65 minutes.   DVT prophylaxis: SCDs.  Code Status: Full code.  Family Communication: Wife and son at bedside.  Disposition Plan: Admitted to stepdown unit.  Consults called: Urology.  Admission status: Inpatient.   Status is: Inpatient The patient requires at least 2 midnights for further evaluation and treatment of present condition.   Joseph LOISE Hurst MD [facility] Pager [phone]  If 7PM-7AM, please contact night-coverage [email or web address] Password [email or web address]  01/09/2024, 12:48 AM

## 2024-01-09 NOTE — Progress Notes (Signed)
 Brief same-day progress note  Patient is a 62Y male with history of DVT /PE on Xarelto , high-grade left ureteral stricture status post left ureteral reimplantation on 12/16/2023 by Dr. Merilyn, OSA on CPAP, abdominal aortic aneurysm, CKD stage IIIb who presented with complaint of blood in the urine, lower abdominal spasms, chills.  On presentation he was afebrile, stable blood pressure.  Lab work showed creatinine of 2.8(baseline creatinine of 1.9), hemoglobin 13.2.  He was admitted for the management of AKI on CKD, gross hematuria.  Started on CBI  Patient seen and examined at bedside today.  He was on CBI.  He complains of some pain on the Foley catheter site.  He remains hemodynamically stable.   Assessment and plan:  Gross hematuria: Urology following.  Started on hyoscyamine  for bladder spasm.  Hemoglobin stable.  Xarelto  on hold. On CBI. If hematuria continues to worsen, plan to hand irrigate and restart CBI. CT abdomen/pelvis showed left nephrostomy catheter with stone adjacent within the renal cortex of the left kidney.  No obstructive changes.  New moderate right-sided hydronephrosis and hydroureter without definite obstructing stone  Normocytic anemia: Currently hemoglobin stable.  Associated with hematuria.  AKI in CKD stage IIIb: Baseline creatinine of 1.9.  Presented with creatinine of 2.8.  Avoid nephrotoxins.  Slowly improving  History of high-grade left urothelial stricture: Status post left ureteral reimplantation on 7/16 by Dr. Alvaro.  Hydronephrosis seen on the right side without definitive obstructing stone.  Currently on Foley  OSA: On CPAP  History of PE/DVT: On Xarelto , currently on hold  Chronic HFpEF: Last echo done on 11/05/2023 showed EF of 65% with grade 1 diastolic dysfunction.  Currently euvolemic.  Ascending aortic aneurysm: Measuring 43 mm.  Continue to monitor annually  Constipation: Continue bowel regimen

## 2024-01-09 NOTE — Progress Notes (Signed)
   01/09/24 2200  BiPAP/CPAP/SIPAP  BiPAP/CPAP/SIPAP Pt Type Adult  BiPAP/CPAP/SIPAP Resmed  Mask Type Nasal mask  Dentures removed? Not applicable  Mask Size Medium  EPAP 9 cmH2O  FiO2 (%) 21 %  Patient Home Machine No  Patient Home Mask No  Patient Home Tubing No  Auto Titrate No  Device Plugged into RED Power Outlet Yes  BiPAP/CPAP /SiPAP Vitals  Resp 15  MEWS Score/Color  MEWS Score 0  MEWS Score Color Green

## 2024-01-09 NOTE — Consult Note (Addendum)
 Urology Consult Note   Requesting Attending Physician:  Jillian Buttery, MD Service Providing Consult: Urology  Consulting Attending: Dr. Selma   Reason for Consult:  Gross hematuria  HPI: Joseph Pham is seen in consultation for reasons noted above at the request of Jillian Buttery, MD for evaluation of gross hematuria.  This is a 73 y.o. male with hx of prostate cancer s/p brachytherapy in 2013, CKD with baseline Cr mid 2s, OSA with CPAP, Diabetes, DVT/PE on Xarelto , and a prior left ureteral stricture managed with PCN now s/p left ureteral reimplant on 7/16. He presented for cystogram and catheter removal ~2 weeks ago and was overall doing well, his PCN remains capped, however noted onset of gross hematuria and dysuria. Does still have left ureteral stent in place.   In the ED he has been afebrile and hemodynamically stable on room air, he has had appropriate urine output, Hgb was 13.2 on admission, 12.1 today, Cr 2.58 from 2.84 on admission, baseline ~2, UA with blood. Bladder scan obtained with minimal PVR. CT A/P obtained with left stent in correct position with no notable hydronephrosis, mild right hydronephrosis, bladder with some likely clot noted.   Patient with 3-way foley on assessment with CBI on moderate drip with light pink urine. I irrigated with 500cc of sterile water  with minimal clot burden removed, given color I clamped CBI.   Assessment:  73 y.o. male with hx above who presents with gross hematuria and dysuria, imaging with new mild right hydro and small volume clot burden in the bladder with appropriately positioned left stent in reimplanted ureter without any hydronephrosis on the left. Overall, given stent and patient's Xarelto  expect intermittent gross hematuria. Given color of urine, I have clamped CBI with plans to reassess around noon.   Recommendations: - Reassess urine around noon  -If urine remains light pink, can consider ToV this afternoon vs tomorrow  morning  -If urine darkens, please hand irrigate and restart CBI at a slow flow - Recommend hyoscyamine  for bladder spasms (order placed)  - Hgb stable, no current indications for transfusions - If otherwise appropriate, would request that Xarelto  be held  - Urology will continue to follow  Past Medical History: 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   Complication of anesthesia    low heart rate during lithotripsy procedure   Deviated septum 07/19/2019   Diabetes (HCC) 03/16/2017   Essential hypertension 10/14/2015   Family history of adverse reaction to anesthesia    sister coded after surgery   GERD (gastroesophageal reflux disease)    Heart murmur    Hematuria    Hemorrhoid    History of kidney stones    Hyperlipidemia    Impingement of left ankle joint 03/06/2021   Kidney stone 03/16/2017   Lumbar radiculopathy 03/16/2017   Nasal turbinate hypertrophy 07/19/2019   OSA on CPAP    Paronychia 08/16/2015   PE (pulmonary thromboembolism) (HCC) 02/2019   Posterior tibialis tendinitis of both lower extremities 02/02/2020  Pronation deformity of both feet 02/02/2020   Prostate cancer (HCC) 03/16/2017   Skin cancer    Tibialis posterior tendinitis 02/11/2021   Tinnitus, bilateral 08/31/2019   UTI (urinary tract infection) 04/09/2022   Wears glasses     Past Surgical History:  Past Surgical History:  Procedure Laterality Date   BACK SURGERY  2019   rod and screws    BILATERAL INGUINAL HERNIA REPAIR  1989   CARDIAC CATHETERIZATION   01/11/2004   NO EVIDENCE SIGNIFICANT EPICARDIAL FLOW LIMITING 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   CARPAL TUNNEL RELEASE Left 06/2016   CARPAL TUNNEL RELEASE Right 03/2017   CATARACT EXTRACTION, BILATERAL Bilateral 2005   COLONOSCOPY     last with medoff unsure when    CYSTOSCOPY WITH RETROGRADE PYELOGRAM, URETEROSCOPY AND STENT PLACEMENT Left 04/09/2022   Procedure: CYSTOSCOPY WITH RETROGRADE PYELOGRAM AND STENT PLACEMENT;  Surgeon: Carolee Sherwood JONETTA DOUGLAS, MD;  Location: [facility];  Service: Urology;  Laterality: Left;   CYSTOSCOPY WITH RETROGRADE PYELOGRAM, URETEROSCOPY AND STENT PLACEMENT Left 05/04/2022   Procedure: CYSTOSCOPY WITH RETROGRADE PYELOGRAM, URETEROSCOPY AND STENT PLACEMENT;  Surgeon: Devere Lonni Righter, MD;  Location: [facility];  Service: Urology;  Laterality: Left;   CYSTOSCOPY WITH URETEROSCOPY AND STENT PLACEMENT Right 10/28/2013   Procedure: RIGHT URETEROSCOPY AND STENT PLACEMENT;  Surgeon: Mark C Ottelin, MD;  Location: [facility];  Service: Urology;  Laterality: Right;   CYSTOSCOPY/URETEROSCOPY/HOLMIUM LASER/STENT PLACEMENT Left 09/06/2020   Procedure: CYSTOSCOPY/POSSIBLE URETEROSCOPY/HOLMIUM LASER/STENT PLACEMENT;  Surgeon: Rosalind Zachary NOVAK, MD;  Location: [facility];  Service: Urology;  Laterality: Left;  30 MINS   CYSTOSCOPY/URETEROSCOPY/HOLMIUM LASER/STENT PLACEMENT Left 04/22/2022   Procedure: CYSTOSCOPY/URETEROSCOPY/HOLMIUM LASER/STENT REPLACEMENT;  Surgeon: Rosalind Zachary NOVAK, MD;  Location: [facility];  Service: Urology;  Laterality: Left;  1 HR   CYSTOSCOPY/URETEROSCOPY/HOLMIUM LASER/STENT PLACEMENT Left 05/20/2022   Procedure: CYSTOSCOPY/URETEROSCOPY/HOLMIUM LASER/STENT EXCHANGE;  Surgeon: Rosalind Zachary NOVAK, MD;  Location: [facility];  Service: Urology;  Laterality: Left;   CYSTOSCOPY/URETEROSCOPY/HOLMIUM LASER/STENT PLACEMENT Left 07/24/2023   Procedure: DIAGNOSTIC URETEROSCOPY;  Surgeon: Selma Donnice SAUNDERS, MD;  Location: [facility];  Service:  Urology;  Laterality: Left;  60 MINUTES NEEDED FOR CASE   EXTRACORPOREAL SHOCK WAVE LITHOTRIPSY  02/08/2009   HOLMIUM LASER APPLICATION Right 10/28/2013   Procedure: HOLMIUM LASER LITHO;  Surgeon: Oneil JAYSON Rafter, MD;  Location: [facility];  Service: Urology;  Laterality: Right;   IR CONVERT LEFT NEPHROSTOMY TO NEPHROURETERAL CATH  09/08/2023   IR NEPHROSTOGRAM LEFT THRU EXISTING ACCESS  09/01/2023   IR NEPHROSTOMY EXCHANGE LEFT  09/08/2023   IR NEPHROSTOMY EXCHANGE LEFT  11/03/2023   IR NEPHROSTOMY PLACEMENT LEFT  08/26/2023   IR NEPHROSTOMY PLACEMENT LEFT  12/14/2023   LEFT KNEE SURG.  1993  &  NOV 2011   LEFT URETEROSCOPIC STONE EXTRACTION  02/23/2009   POLYPECTOMY  2014   1 polyp per dr ira notes- no surg path to determine polyps type but was a 5 yr recall    RADIOACTIVE SEED IMPLANT  08/08/2011   Procedure: RADIOACTIVE SEED IMPLANT;  Surgeon: Oneil JAYSON Rafter, MD;  Location: [facility];  Service: Urology;  Laterality: N/A;  seeds implanted 50 seeds found in bladder=none    REPLACEMENT TOTAL KNEE Left 2018   SKIN CANCER EXCISION     Burned off left side of nose, and head   TOTAL KNEE ARTHROPLASTY Right 08/2018   UMBILICAL HERNIA REPAIR N/A 12/16/2023   Procedure: REPAIR, HERNIA, UMBILICAL, ADULT;  Surgeon: Alvaro Ricardo KATHEE Mickey., MD;  Location: [facility];  Service: Urology;  Laterality: N/A;   UPPER GASTROINTESTINAL ENDOSCOPY     URETEROSCOPY  03/15/2012   Procedure: URETEROSCOPY;  Surgeon: Oneil JAYSON Rafter, MD;  Location[facility];  Service: Urology;  Laterality: Right;  Right Ueteroscopy     Medication: Current Facility-Administered Medications  Medication Dose Route Frequency Provider Last Rate Last Admin   acetaminophen  (TYLENOL ) tablet 650 mg  650 mg Oral Q6H PRN Shona Laurence N, DO       HYDROmorphone  (DILAUDID ) injection 0.5 mg  0.5 mg Intravenous Q4H PRN Hall, Carole N, DO   0.5 mg at 01/09/24 0143   melatonin tablet 5 mg  5 mg Oral QHS  PRN Shona Laurence SAILOR, DO       oxyCODONE  (Oxy IR/ROXICODONE ) immediate release tablet 5 mg  5 mg Oral Q6H PRN Shona Laurence N, DO       polyethylene glycol (MIRALAX  / GLYCOLAX ) packet 17 g  17 g Oral Daily PRN Shona Laurence N, DO       prochlorperazine  (COMPAZINE ) injection 5 mg  5 mg Intravenous Q6H PRN Shona Laurence N, DO       sodium chloride  irrigation 0.9 % 3,000 mL  3,000 mL Irrigation Continuous Hall, Carole N, DO   3,000 mL at 01/09/24 0245    Allergies: Allergies  Allergen Reactions   Aspirin Anaphylaxis   Doxycycline Nausea And Vomiting   Amoxicillin Itching   Chlorhexidine  Gluconate Rash    Social History: Social History   Tobacco Use   Smoking status: Former    Current packs/day: 0.00    Types: Cigarettes    Start date: 07/03/1983    Quit date: 07/02/1993    Years since quitting: 30.5   Smokeless tobacco: Former    Types: Associate Professor status: Never Used  Substance Use Topics   Alcohol use: No   Drug use: No    Family History Family History  Problem Relation Age of Onset   Cancer Mother    Diabetes Father    CAD Father    Heart attack Father    Diabetes Sister    Colon polyps Sister    Colon polyps Brother    Colon cancer Neg Hx    Esophageal cancer Neg Hx    Rectal cancer Neg Hx    Stomach cancer Neg Hx     Review of Systems 10 systems were reviewed and are negative except as noted specifically in the HPI.  Objective   Vital signs in last 24 hours: BP (!) 150/103   Pulse 74   Temp 98.4 F (36.9 C) (Oral)   Resp 18   SpO2 94%   Physical Exam General: NAD, A&O, resting, appropriate HEENT: [place]/AT, EOMI, MMM Pulmonary: Normal work of breathing Cardiovascular: HDS, adequate peripheral perfusion Abdomen: Soft, NTTP, nondistended. GU: Foley catheter with light pink urine, No CVA tenderness Extremities: warm and well perfused Neuro: Appropriate, no focal neurological deficits  Most Recent Labs: Lab Results  Component Value Date    WBC 7.8 01/09/2024   HGB 12.1 (L) 01/09/2024   HCT 36.6 (L) 01/09/2024   PLT 191 01/09/2024    Lab Results  Component Value Date   NA 137 01/09/2024   K 3.6 01/09/2024   CL 102 01/09/2024   CO2 23 01/09/2024   BUN 31 (H) 01/09/2024   CREATININE 2.58 (H) 01/09/2024   CALCIUM  8.4 (L) 01/09/2024   MG  1.9 01/09/2024   PHOS 3.9 01/09/2024    Lab Results  Component Value Date   INR 1.0 09/08/2023   APTT 33 08/04/2011     Urine Culture: @[id number] (laburin,org,[id number])@   IMAGING: CT ABDOMEN PELVIS WO CONTRAST Result Date: 01/09/2024 CLINICAL DATA:  Hematuria, known left-sided nephrostomy catheter EXAM: CT ABDOMEN AND PELVIS WITHOUT CONTRAST TECHNIQUE: Multidetector CT imaging of the abdomen and pelvis was performed following the standard protocol without IV contrast. RADIATION DOSE REDUCTION: This exam was performed according to the departmental dose-optimization program which includes automated exposure control, adjustment of the mA and/or kV according to patient size and/or use of iterative reconstruction technique. COMPARISON:  12/01/2023 FINDINGS: Lower chest: Stable scarring is noted in the left base. No effusion is seen. Hepatobiliary: No focal liver abnormality is seen. No gallstones, gallbladder wall thickening, or biliary dilatation. Pancreas: Unremarkable. No pancreatic ductal dilatation or surrounding inflammatory changes. Spleen: Normal in size without focal abnormality. Adrenals/Urinary Tract: Adrenal glands are within normal limits. Left kidney demonstrates some cortical thinning. Left-sided nephrostomy catheter and ureteral stent are noted in satisfactory position. Cortical stone is again noted adjacent to the left nephrostomy catheter stable from the prior exam. Ureteral stent is noted on the left. The ureter appears to be have been reimplanted on the left. The right kidney shows new hydronephrosis. No definitive stone is identified. These changes may be related to the  ongoing hematuria and thrombus within the bladder. Stomach/Bowel: The appendix is within normal limits. No obstructive or inflammatory changes of the colon are noted. Small bowel and stomach are within normal limits. Vascular/Lymphatic: Aortic atherosclerosis. No enlarged abdominal or pelvic lymph nodes. Reproductive: Brachytherapy seeds are noted within the prostate. Other: No abdominal wall hernia or abnormality. No abdominopelvic ascites. Musculoskeletal: Postsurgical changes are noted in the lower lumbar spine. IMPRESSION: Persistent left nephrostomy catheter with stone adjacent within the renal cortex of the left kidney. New left ureteral stent has been placed and there are changes consistent with reimplantation of the distal left ureter. No obstructive changes are seen. New moderate right-sided hydronephrosis and hydroureter without definitive obstructing stone. This may be related to thrombus within the bladder consistent with the given clinical history of hematuria. Electronically Signed   By: Oneil Devonshire M.D.   On: 01/09/2024 00:13    ------     Thank you for this consult. Please contact the urology consult pager with any further questions/concerns.  I have seen and examined the patient and agree with the above assessment and plan.  Pt feels well, Urine clear yellow in tubing with CBI clamped all morning. No further clots. Favor keeping catheter in place today, resume xarelto  tomorrow and if still clear, void trial Sunday AM.  Matt R. Malinda Mayden MD [facility]  Pager: [phone]

## 2024-01-09 NOTE — Progress Notes (Signed)
 Urology came by to reassess patient, want to keep CBI clamped, plan to keep foley until tomorrow.

## 2024-01-10 DIAGNOSIS — R310 Gross hematuria: Secondary | ICD-10-CM | POA: Diagnosis not present

## 2024-01-10 LAB — BASIC METABOLIC PANEL WITH GFR
Anion gap: 11 (ref 5–15)
BUN: 30 mg/dL — ABNORMAL HIGH (ref 8–23)
CO2: 25 mmol/L (ref 22–32)
Calcium: 8.7 mg/dL — ABNORMAL LOW (ref 8.9–10.3)
Chloride: 101 mmol/L (ref 98–111)
Creatinine, Ser: 2.55 mg/dL — ABNORMAL HIGH (ref 0.61–1.24)
GFR, Estimated: 26 mL/min — ABNORMAL LOW (ref >60)
Glucose, Bld: 126 mg/dL — ABNORMAL HIGH (ref 70–99)
Potassium: 4.1 mmol/L (ref 3.5–5.1)
Sodium: 137 mmol/L (ref 135–145)

## 2024-01-10 LAB — HEMOGLOBIN AND HEMATOCRIT, BLOOD
HCT: 39 % (ref 39.0–52.0)
Hemoglobin: 12.4 g/dL — ABNORMAL LOW (ref 13.0–17.0)

## 2024-01-10 LAB — GLUCOSE, CAPILLARY
Glucose-Capillary: 228 mg/dL — ABNORMAL HIGH (ref 70–99)
Glucose-Capillary: 172 mg/dL — ABNORMAL HIGH (ref 70–99)

## 2024-01-10 NOTE — Consult Note (Addendum)
 Urology Consult Note   Requesting Attending Physician:  Jillian Buttery, MD Service Providing Consult: Urology  Consulting Attending: Dr. Selma   Reason for Consult:  Gross hematuria  HPI: Joseph Pham is seen in consultation for reasons noted above at the request of Jillian Buttery, MD for evaluation of gross hematuria.  This is a 73 y.o. male with hx of prostate cancer s/p brachytherapy in 2013, CKD with baseline Cr mid 2s, OSA with CPAP, Diabetes, DVT/PE on Xarelto , and a prior left ureteral stricture managed with PCN now s/p left ureteral reimplant on 7/16. He presented for cystogram and catheter removal ~2 weeks ago and was overall doing well, his PCN remains capped, however noted onset of gross hematuria and dysuria. Does still have left ureteral stent in place.   In the ED he has been afebrile and hemodynamically stable on room air, he has had appropriate urine output, Hgb was 13.2 on admission, 12.1 today, Cr 2.58 from 2.84 on admission, baseline ~2, UA with blood. Bladder scan obtained with minimal PVR. CT A/P obtained with left stent in correct position with no notable hydronephrosis, mild right hydronephrosis, bladder with some likely clot noted.   Patient with 3-way foley on assessment with CBI on moderate drip with light pink urine. I irrigated with 500cc of sterile water  with minimal clot burden removed, given color I clamped CBI.   Assessment:  73 y.o. male with hx above who presents with gross hematuria and dysuria, imaging with new mild right hydro and small volume clot burden in the bladder with appropriately positioned left stent in reimplanted ureter without any hydronephrosis on the left. Overall, given stent and patient's Xarelto  expect intermittent gross hematuria. Urine has remained clear  Interval 8/10: AFVSS, Cr downtrending, hgb pending this AM. Adequate UoP. Color yellow.   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   Complication of anesthesia    low heart rate during lithotripsy procedure   Deviated septum 07/19/2019   Diabetes (HCC) 03/16/2017   Essential hypertension 10/14/2015   Family history of adverse reaction to anesthesia    sister coded after surgery   GERD (gastroesophageal reflux disease)    Heart murmur    Hematuria    Hemorrhoid    History of kidney stones    Hyperlipidemia    Impingement of left ankle joint 03/06/2021   Kidney stone 03/16/2017   Lumbar radiculopathy 03/16/2017   Nasal turbinate hypertrophy 07/19/2019   OSA on CPAP    Paronychia 08/16/2015   PE (pulmonary thromboembolism) (HCC) 02/2019   Posterior tibialis tendinitis of both lower extremities 02/02/2020   Pronation deformity of both feet 02/02/2020   Prostate cancer (HCC) 03/16/2017   Skin cancer    Tibialis posterior tendinitis 02/11/2021   Tinnitus, bilateral 08/31/2019  UTI (urinary tract infection) 04/09/2022   Wears glasses     Past Surgical History:  Past Surgical History:  Procedure Laterality Date   BACK SURGERY  2019   rod and screws    BILATERAL INGUINAL HERNIA REPAIR  1989   CARDIAC CATHETERIZATION  01/11/2004   NO EVIDENCE SIGNIFICANT EPICARDIAL FLOW LIMITING 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   CARPAL  TUNNEL RELEASE Left 06/2016   CARPAL TUNNEL RELEASE Right 03/2017   CATARACT EXTRACTION, BILATERAL Bilateral 2005   COLONOSCOPY     last with medoff unsure when    CYSTOSCOPY WITH RETROGRADE PYELOGRAM, URETEROSCOPY AND STENT PLACEMENT Left 04/09/2022   Procedure: CYSTOSCOPY WITH RETROGRADE PYELOGRAM AND STENT PLACEMENT;  Surgeon: Carolee Sherwood JONETTA DOUGLAS, MD;  Location: [facility];  Service: Urology;  Laterality: Left;   CYSTOSCOPY WITH RETROGRADE PYELOGRAM, URETEROSCOPY AND STENT PLACEMENT Left 05/04/2022   Procedure: CYSTOSCOPY WITH RETROGRADE PYELOGRAM, URETEROSCOPY AND STENT PLACEMENT;  Surgeon: Devere Lonni Righter, MD;  Location: [facility];  Service: Urology;  Laterality: Left;   CYSTOSCOPY WITH URETEROSCOPY AND STENT PLACEMENT Right 10/28/2013   Procedure: RIGHT URETEROSCOPY AND STENT PLACEMENT;  Surgeon: Mark C Ottelin, MD;  Location: [facility];  Service: Urology;  Laterality: Right;   CYSTOSCOPY/URETEROSCOPY/HOLMIUM LASER/STENT PLACEMENT Left 09/06/2020   Procedure: CYSTOSCOPY/POSSIBLE URETEROSCOPY/HOLMIUM LASER/STENT PLACEMENT;  Surgeon: Rosalind Zachary NOVAK, MD;  Location: [facility];  Service: Urology;  Laterality: Left;  30 MINS   CYSTOSCOPY/URETEROSCOPY/HOLMIUM LASER/STENT PLACEMENT Left 04/22/2022   Procedure: CYSTOSCOPY/URETEROSCOPY/HOLMIUM LASER/STENT REPLACEMENT;  Surgeon: Rosalind Zachary NOVAK, MD;  Location: [facility];  Service: Urology;  Laterality: Left;  1 HR   CYSTOSCOPY/URETEROSCOPY/HOLMIUM LASER/STENT PLACEMENT Left 05/20/2022   Procedure: CYSTOSCOPY/URETEROSCOPY/HOLMIUM LASER/STENT EXCHANGE;  Surgeon: Rosalind Zachary NOVAK, MD;  Location: [facility];  Service: Urology;  Laterality: Left;   CYSTOSCOPY/URETEROSCOPY/HOLMIUM LASER/STENT PLACEMENT Left 07/24/2023   Procedure: DIAGNOSTIC URETEROSCOPY;  Surgeon: Selma Donnice SAUNDERS, MD;  Location: [facility];  Service: Urology;  Laterality: Left;  60 MINUTES NEEDED FOR CASE   EXTRACORPOREAL SHOCK WAVE LITHOTRIPSY  02/08/2009   HOLMIUM LASER  APPLICATION Right 10/28/2013   Procedure: HOLMIUM LASER LITHO;  Surgeon: Oneil JAYSON Rafter, MD;  Location: [facility];  Service: Urology;  Laterality: Right;   IR CONVERT LEFT NEPHROSTOMY TO NEPHROURETERAL CATH  09/08/2023   IR NEPHROSTOGRAM LEFT THRU EXISTING ACCESS  09/01/2023   IR NEPHROSTOMY EXCHANGE LEFT  09/08/2023   IR NEPHROSTOMY EXCHANGE LEFT  11/03/2023   IR NEPHROSTOMY PLACEMENT LEFT  08/26/2023   IR NEPHROSTOMY PLACEMENT LEFT  12/14/2023   LEFT KNEE SURG.  1993  &  NOV 2011   LEFT URETEROSCOPIC STONE EXTRACTION  02/23/2009   POLYPECTOMY  2014   1 polyp per dr ira notes- no surg path to determine polyps type but was a 5 yr recall    RADIOACTIVE SEED IMPLANT  08/08/2011   Procedure: RADIOACTIVE SEED IMPLANT;  Surgeon: Oneil JAYSON Rafter, MD;  Location: [facility];  Service: Urology;  Laterality: N/A;  seeds implanted 50 seeds found in bladder=none    REPLACEMENT TOTAL KNEE Left 2018   SKIN CANCER EXCISION     Burned off left side of nose, and head   TOTAL KNEE ARTHROPLASTY Right 08/2018   UMBILICAL HERNIA REPAIR N/A 12/16/2023   Procedure: REPAIR, HERNIA, UMBILICAL, ADULT;  Surgeon: Alvaro Ricardo NOVAK Mickey., MD;  Location: [facility];  Service: Urology;  Laterality: N/A;   UPPER GASTROINTESTINAL ENDOSCOPY     URETEROSCOPY  03/15/2012  Procedure: URETEROSCOPY;  Surgeon: Oneil JAYSON Rafter, MD;  Location: [facility];  Service: Urology;  Laterality: Right;  Right Ueteroscopy     Medication: Current Facility-Administered Medications  Medication Dose Route Frequency Provider Last Rate Last Admin   acetaminophen  (TYLENOL ) tablet 650 mg  650 mg Oral Q6H PRN Shona Laurence N, DO   650 mg at 01/09/24 1735   amLODipine  (NORVASC ) tablet 10 mg  10 mg Oral Daily Jillian Buttery, MD   10 mg at 01/09/24 1521   atorvastatin  (LIPITOR) tablet 10 mg  10 mg Oral QHS Jillian Buttery, MD   10 mg at 01/09/24 2104   HYDROmorphone  (DILAUDID ) injection 0.5 mg  0.5 mg  Intravenous Q4H PRN Shona Laurence N, DO   0.5 mg at 01/09/24 0143   hyoscyamine  (ANASPAZ ) disintergrating tablet 0.125 mg  0.125 mg Sublingual Q6H PRN Trivedi, Hersh M, MD   0.125 mg at 01/09/24 9081   insulin  aspart (novoLOG ) injection 0-15 Units  0-15 Units Subcutaneous TID WC Adhikari, Amrit, MD   2 Units at 01/09/24 1735   loratadine  (CLARITIN ) tablet 10 mg  10 mg Oral Daily Adhikari, Amrit, MD   10 mg at 01/09/24 1529   melatonin tablet 5 mg  5 mg Oral QHS PRN Shona Laurence SAILOR, DO       oxyCODONE  (Oxy IR/ROXICODONE ) immediate release tablet 5 mg  5 mg Oral Q6H PRN Hall, Carole N, DO   5 mg at 01/09/24 1958   pantoprazole  (PROTONIX ) EC tablet 40 mg  40 mg Oral BID Jillian Buttery, MD   40 mg at 01/09/24 2104   pneumococcal 20-valent conjugate vaccine (PREVNAR 20) injection 0.5 mL  0.5 mL Intramuscular Prior to discharge Jillian Buttery, MD       polyethylene glycol (MIRALAX  / GLYCOLAX ) packet 17 g  17 g Oral Daily Adhikari, Amrit, MD       prochlorperazine  (COMPAZINE ) injection 5 mg  5 mg Intravenous Q6H PRN Hall, Carole N, DO   5 mg at 01/09/24 1735   senna-docusate (Senokot-S) tablet 1 tablet  1 tablet Oral BID Jillian Buttery, MD   1 tablet at 01/09/24 2104   sodium chloride  irrigation 0.9 % 3,000 mL  3,000 mL Irrigation Continuous Hall, Carole N, DO   Stopped at 01/09/24 0715   traZODone  (DESYREL ) tablet 100 mg  100 mg Oral QHS Adhikari, Amrit, MD   100 mg at 01/09/24 2104    Allergies: Allergies  Allergen Reactions   Aspirin Anaphylaxis   Doxycycline Nausea And Vomiting   Amoxicillin Itching   Chlorhexidine  Gluconate Rash    Social History: Social History   Tobacco Use   Smoking status: Former    Current packs/day: 0.00    Types: Cigarettes    Start date: 07/03/1983    Quit date: 07/02/1993    Years since quitting: 30.5   Smokeless tobacco: Former    Types: Engineer, drilling   Vaping status: Never Used  Substance Use Topics   Alcohol use: No   Drug use: No    Family  History Family History  Problem Relation Age of Onset   Cancer Mother    Diabetes Father    CAD Father    Heart attack Father    Diabetes Sister    Colon polyps Sister    Colon polyps Brother    Colon cancer Neg Hx    Esophageal cancer Neg Hx    Rectal cancer Neg Hx    Stomach cancer Neg Hx  Review of Systems 10 systems were reviewed and are negative except as noted specifically in the HPI.  Objective   Vital signs in last 24 hours: BP 133/71 (BP Location: Left Arm)   Pulse 73   Temp 98.4 F (36.9 C) (Oral)   Resp 18   Ht 5' 6 (1.676 m)   Wt 116.9 kg   SpO2 95%   BMI 41.60 kg/m   Physical Exam General: NAD, A&O, resting, appropriate HEENT: [place]/AT, EOMI, MMM Pulmonary: Normal work of breathing Cardiovascular: HDS, adequate peripheral perfusion Abdomen: Soft, NTTP, nondistended. GU: Foley catheter with light pink urine, No CVA tenderness Extremities: warm and well perfused Neuro: Appropriate, no focal neurological deficits  Most Recent Labs: Lab Results  Component Value Date   WBC 7.8 01/09/2024   HGB 12.8 (L) 01/09/2024   HCT 38.7 (L) 01/09/2024   PLT 191 01/09/2024    Lab Results  Component Value Date   NA 137 01/10/2024   K 4.1 01/10/2024   CL 101 01/10/2024   CO2 25 01/10/2024   BUN 30 (H) 01/10/2024   CREATININE 2.55 (H) 01/10/2024   CALCIUM  8.7 (L) 01/10/2024   MG 1.9 01/09/2024   PHOS 3.9 01/09/2024    Lab Results  Component Value Date   INR 1.0 09/08/2023   APTT 33 08/04/2011     Urine Culture: @[id number] (laburin,org,[id number])@   IMAGING: CT ABDOMEN PELVIS WO CONTRAST Result Date: 01/09/2024 CLINICAL DATA:  Hematuria, known left-sided nephrostomy catheter EXAM: CT ABDOMEN AND PELVIS WITHOUT CONTRAST TECHNIQUE: Multidetector CT imaging of the abdomen and pelvis was performed following the standard protocol without IV contrast. RADIATION DOSE REDUCTION: This exam was performed according to the departmental dose-optimization  program which includes automated exposure control, adjustment of the mA and/or kV according to patient size and/or use of iterative reconstruction technique. COMPARISON:  12/01/2023 FINDINGS: Lower chest: Stable scarring is noted in the left base. No effusion is seen. Hepatobiliary: No focal liver abnormality is seen. No gallstones, gallbladder wall thickening, or biliary dilatation. Pancreas: Unremarkable. No pancreatic ductal dilatation or surrounding inflammatory changes. Spleen: Normal in size without focal abnormality. Adrenals/Urinary Tract: Adrenal glands are within normal limits. Left kidney demonstrates some cortical thinning. Left-sided nephrostomy catheter and ureteral stent are noted in satisfactory position. Cortical stone is again noted adjacent to the left nephrostomy catheter stable from the prior exam. Ureteral stent is noted on the left. The ureter appears to be have been reimplanted on the left. The right kidney shows new hydronephrosis. No definitive stone is identified. These changes may be related to the ongoing hematuria and thrombus within the bladder. Stomach/Bowel: The appendix is within normal limits. No obstructive or inflammatory changes of the colon are noted. Small bowel and stomach are within normal limits. Vascular/Lymphatic: Aortic atherosclerosis. No enlarged abdominal or pelvic lymph nodes. Reproductive: Brachytherapy seeds are noted within the prostate. Other: No abdominal wall hernia or abnormality. No abdominopelvic ascites. Musculoskeletal: Postsurgical changes are noted in the lower lumbar spine. IMPRESSION: Persistent left nephrostomy catheter with stone adjacent within the renal cortex of the left kidney. New left ureteral stent has been placed and there are changes consistent with reimplantation of the distal left ureter. No obstructive changes are seen. New moderate right-sided hydronephrosis and hydroureter without definitive obstructing stone. This may be related to  thrombus within the bladder consistent with the given clinical history of hematuria. Electronically Signed   By: Oneil Devonshire M.D.   On: 01/09/2024 00:13    ------     Thank  you for this consult. Please contact the urology consult pager with any further questions/concerns.  I have seen and examined the patient and agree with the above assessment and plan.  Voiding clear yellow urine. Restarted xarelto . Ok to discharge home.  Matt R. Dula Havlik MD [facility]  Pager: [phone]

## 2024-01-10 NOTE — Progress Notes (Signed)
 AVS given to patient and explained at the bedside. Medications and follow up appointments have been explained with pt verbalizing understanding.

## 2024-01-10 NOTE — Progress Notes (Signed)
   01/10/24 1205  TOC Brief Assessment  Insurance and Status Reviewed  Patient has primary care physician Yes  Home environment has been reviewed single family home  Prior level of function: independent  Prior/Current Home Services No current home services  Social Drivers of Health Review SDOH reviewed no interventions necessary  Readmission risk has been reviewed Yes  Transition of care needs no transition of care needs at this time    Signed: Heather Saltness, MSW, LCSW Clinical Social Worker Inpatient Care Management 01/10/2024 12:05 PM

## 2024-01-10 NOTE — Discharge Summary (Signed)
 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 Merilyn, OSA on CPAP, abdominal aortic aneurysm, CKD stage IIIb who presented with complaint of blood in the urine, lower abdominal spasms, chills.  On presentation he was afebrile, stable blood pressure.  Lab work showed creatinine of 2.8(baseline creatinine of 1.9), hemoglobin 13.2.  He was admitted for the management of AKI on CKD, gross hematuria.  Started on CBI .  Foley was draining clear urine today. Urology decided to discontinue CBI, given voiding trial, Foley removed.  He is voiding clear urine by himself.  Urology cleared for discharge.  He will follow-up with urology as an outpatient  Following problems were addressed during the hospitalization:  Gross hematuria: Urology following.  Started on hyoscyamine  for bladder spasm.  Hemoglobin stable.  . CT abdomen/pelvis showed left nephrostomy catheter with stone adjacent within the renal cortex of the left kidney.  No obstructive changes.  New moderate right-sided hydronephrosis and hydroureter without definite obstructing stone.  Foley was draining clear urine today. Urology decided to discontinue CBI, given voiding trial, Foley removed.  He is voiding clear urine by himself.  Urology cleared for discharge.  He will follow-up with urology as an outpatient  Normocytic anemia: Currently hemoglobin stable.  Associated with hematuria.   AKI in CKD stage IIIb: Baseline creatinine of 1.9.  Presented with creatinine of 2.8.  .  Slowly improving.  Check BMP in a week by following with the  PCP   History of high-grade left urothelial stricture: Status post left ureteral reimplantation on 7/16 by Dr. Alvaro.  Hydronephrosis seen on the right side without definitive obstructing stone.    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  Currently euvolemic.   Ascending aortic aneurysm: Measuring 43 mm.  Continue to monitor annually      Discharge Diagnoses:  Principal Problem:   Gross hematuria    Discharge Instructions  Discharge Instructions     Diet - low sodium heart healthy   Complete by: As directed    Discharge instructions   Complete by: As directed    1)Please take your medications as instructed 2)Follow up with your PCP next week and do a BMP test to check your kidney function 3)Follow up with urology as an outpatient   Increase activity slowly   Complete by: As directed       Allergies as of 01/10/2024       Reactions   Aspirin Anaphylaxis   Doxycycline Nausea And Vomiting   Amoxicillin Itching   Chlorhexidine  Gluconate Rash        Medication List     STOP taking these medications    cephALEXin  500 MG capsule Commonly known as: KEFLEX        TAKE these medications    acetaminophen  500 MG tablet Commonly known as: TYLENOL  Take 1,000 mg by mouth every 8 (eight) hours as needed for moderate pain (pain score 4-6).   amLODipine  10 MG tablet Commonly known as: NORVASC  Take 1 tablet (10 mg total)  by mouth daily.   atorvastatin  10 MG tablet Commonly known as: LIPITOR Take 10 mg by mouth at bedtime.   cholecalciferol  25 MCG (1000 UNIT) tablet Commonly known as: VITAMIN D3 Take 1,000 Units by mouth daily.   docusate sodium  100 MG capsule Commonly known as: COLACE Take 1 capsule (100 mg total) by mouth 2 (two) times daily. What changed:  when to take this reasons to take this   fexofenadine 180 MG tablet Commonly known  as: ALLEGRA Take 180 mg by mouth in the morning.   fluticasone  50 MCG/ACT nasal spray Commonly known as: FLONASE  Place 2 sprays into the nose daily as needed for allergies.   HYDROcodone -acetaminophen  5-325 MG tablet Commonly known as: NORCO/VICODIN Take 1-2 tablets by mouth every 6 (six) hours as needed for moderate pain (pain score 4-6) or severe pain (pain score 7-10).   hydrocortisone 2.5 % cream Apply 1 Application topically daily as needed (ear irritation).   Jardiance 10 MG Tabs tablet Generic drug: empagliflozin Take 10 mg by mouth every morning.   meclizine  25 MG tablet Commonly known as: ANTIVERT  Take 1 tablet (25 mg total) by mouth 3 (three) times daily as needed for dizziness.   omega-3 acid ethyl esters 1 g capsule Commonly known as: LOVAZA  Take 4 g by mouth at bedtime.   omeprazole 20 MG capsule Commonly known as: PRILOSEC Take 20 mg by mouth in the morning and at bedtime.   potassium citrate 10 MEQ (1080 MG) SR tablet Commonly known as: UROCIT-K Take 10 mEq by mouth in the morning.   predniSONE  10 MG tablet Commonly known as: DELTASONE  Take 10 mg by mouth as directed.   rivaroxaban  10 MG Tabs tablet Commonly known as: XARELTO  Take 10 mg by mouth daily.   Systane 0.4-0.3 % Soln Generic drug: Polyethyl Glycol-Propyl Glycol Place 1-2 drops into both eyes 3 (three) times daily as needed (dry/irritated eyes.).   traMADol  50 MG tablet Commonly known as: ULTRAM  Take 50 mg by mouth 2 (two) times daily.   traZODone  100 MG tablet Commonly known as: DESYREL  Take 100 mg by mouth at bedtime.        Follow-up Information     [facility]. Schedule an appointment as soon as possible for a visit in 1 week(s).   Contact information: [address] [place]  [address] [phone]               Allergies  Allergen Reactions   Aspirin Anaphylaxis   Doxycycline Nausea And Vomiting   Amoxicillin Itching    Chlorhexidine  Gluconate Rash    Consultations: Urology   Procedures/Studies: CT ABDOMEN PELVIS WO CONTRAST Result Date: 01/09/2024 CLINICAL DATA:  Hematuria, known left-sided nephrostomy catheter EXAM: CT ABDOMEN AND PELVIS WITHOUT CONTRAST TECHNIQUE: Multidetector CT imaging of the abdomen and pelvis was performed following the standard protocol without IV contrast. RADIATION DOSE REDUCTION: This exam was performed according to the departmental dose-optimization program which includes automated exposure control, adjustment of the mA and/or kV according to patient size and/or use of iterative reconstruction technique. COMPARISON:  12/01/2023 FINDINGS: Lower chest: Stable scarring is noted in the left base. No effusion is seen. Hepatobiliary: No focal liver abnormality is seen. No gallstones, gallbladder wall thickening, or biliary dilatation. Pancreas: Unremarkable. No pancreatic ductal dilatation or surrounding inflammatory changes. Spleen: Normal in size without focal abnormality. Adrenals/Urinary Tract: Adrenal glands are within normal limits. Left kidney demonstrates some cortical thinning. Left-sided nephrostomy catheter and ureteral stent are noted  in satisfactory position. Cortical stone is again noted adjacent to the left nephrostomy catheter stable from the prior exam. Ureteral stent is noted on the left. The ureter appears to be have been reimplanted on the left. The right kidney shows new hydronephrosis. No definitive stone is identified. These changes may be related to the ongoing hematuria and thrombus within the bladder. Stomach/Bowel: The appendix is within normal limits. No obstructive or inflammatory changes of the colon are noted. Small bowel and stomach are within normal limits. Vascular/Lymphatic: Aortic atherosclerosis. No enlarged abdominal or pelvic lymph nodes. Reproductive: Brachytherapy seeds are noted within the prostate. Other: No abdominal wall hernia or abnormality. No  abdominopelvic ascites. Musculoskeletal: Postsurgical changes are noted in the lower lumbar spine. IMPRESSION: Persistent left nephrostomy catheter with stone adjacent within the renal cortex of the left kidney. New left ureteral stent has been placed and there are changes consistent with reimplantation of the distal left ureter. No obstructive changes are seen. New moderate right-sided hydronephrosis and hydroureter without definitive obstructing stone. This may be related to thrombus within the bladder consistent with the given clinical history of hematuria. Electronically Signed   By: Oneil Devonshire M.D.   On: 01/09/2024 00:13   IR NEPHROSTOMY PLACEMENT LEFT Result Date: 12/14/2023 INDICATION: Left nephrostomy inadvertently removed EXAM: FLUOROSCOPIC REPLACEMENT OF THE 10 FRENCH LEFT NEPHROSTOMY Date:  12/14/2023 12/14/2023 1:54 pm Radiologist:  CHRISTELLA. Frederic Specking, MD Guidance:  Fluoroscopic FLUOROSCOPY: 1.0 minutes 24 seconds (36 mGy). MEDICATIONS: 1% lidocaine  local ANESTHESIA/SEDATION: None. CONTRAST:  15mL OMNIPAQUE  IOHEXOL  300 MG/ML  SOLN COMPLICATIONS: None immediate. PROCEDURE: Informed consent was obtained from the patient following explanation of the procedure, risks, benefits and alternatives. The patient understands, agrees and consents for the procedure. All questions were addressed. A time out was performed. Maximal barrier sterile technique utilized including caps, mask, sterile gowns, sterile gloves, large sterile drape, hand hygiene, and ChloraPrep. Under sterile conditions, the existing percutaneous nephrostomy tract was cannulated easily with a Kumpe catheter. Contrast injection confirms position in the renal pelvis. Mild hydronephrosis noted. Amplatz guidewire inserted. Tract dilatation performed to insert a 10 [place] drain. Retention loop formed the renal pelvis. Position confirmed with contrast injection. Images obtained for documentation. Catheter secured with a silk suture and a sterile  dressing. Gravity drainage bag connected. No immediate complication. Patient tolerated the procedure well. IMPRESSION: Successful replacement of the 10 French left nephrostomy. Electronically Signed   By: CHRISTELLA.  Shick M.D.   On: 12/14/2023 14:22      Subjective: Patient seen and examined at bedside today.  Hemodynamically stable.  Urine is clear.  Decision made to remove foley cathter.  He voided well.Urine clear  Discharge Exam: Vitals:   01/09/24 2200 01/10/24 0454  BP:  133/71  Pulse:  73  Resp: 15 18  Temp:  98.4 F (36.9 C)  SpO2:  95%   Vitals:   01/09/24 2031 01/09/24 2200 01/10/24 0454 01/10/24 0457  BP: 138/73  133/71   Pulse: 66  73   Resp: 19 15 18    Temp: 97.7 F (36.5 C)  98.4 F (36.9 C)   TempSrc: Oral  Oral   SpO2: 95%  95%   Weight:    116.9 kg  Height:        General: Pt is alert, awake, not in acute distress,obese Cardiovascular: RRR, S1/S2 +, no rubs, no gallops Respiratory: CTA bilaterally, no wheezing, no rhonchi Abdominal: Soft, NT, ND, bowel sounds + Extremities: no edema, no cyanosis    The results  of significant diagnostics from this hospitalization (including imaging, microbiology, ancillary and laboratory) are listed below for reference.     Microbiology: Recent Results (from the past 240 hours)  MRSA Next Gen by PCR, Nasal     Status: None   Collection Time: 01/09/24  6:42 AM   Specimen: Nasal Mucosa; Nasal Swab  Result Value Ref Range Status   MRSA by PCR Next Gen NOT DETECTED NOT DETECTED Final    Comment: (NOTE) The GeneXpert MRSA Assay (FDA approved for NASAL specimens only), is one component of a comprehensive MRSA colonization surveillance program. It is not intended to diagnose MRSA infection nor to guide or monitor treatment for MRSA infections. Test performance is not FDA approved in patients less than 47 years old. Performed at [facility], [address], [place], [place] [address]      Labs: BNP  (last 3 results) Recent Labs    12/01/23 0127  BNP 30.7   Basic Metabolic Panel: Recent Labs  Lab 01/08/24 2054 01/09/24 0518 01/10/24 0404  NA 137 137 137  K 3.6 3.6 4.1  CL 101 102 101  CO2 23 23 25   GLUCOSE 132* 169* 126*  BUN 27* 31* 30*  CREATININE 2.84* 2.58* 2.55*  CALCIUM  8.7* 8.4* 8.7*  MG  --  1.9  --   PHOS  --  3.9  --    Liver Function Tests: No results for input(s): AST, ALT, ALKPHOS, BILITOT, PROT, ALBUMIN in the last 168 hours. No results for input(s): LIPASE, AMYLASE in the last 168 hours. No results for input(s): AMMONIA in the last 168 hours. CBC: Recent Labs  Lab 01/08/24 2054 01/09/24 0518 01/09/24 0745 01/09/24 1301 01/10/24 0402  WBC 8.4 7.8  --   --   --   HGB 13.2 12.1* 12.4* 12.8* 12.4*  HCT 40.4 36.6* 37.9* 38.7* 39.0  MCV 86.1 87.1  --   --   --   PLT 214 191  --   --   --    Cardiac Enzymes: No results for input(s): CKTOTAL, CKMB, CKMBINDEX, TROPONINI in the last 168 hours. BNP: Invalid input(s): POCBNP CBG: Recent Labs  Lab 01/09/24 1717 01/09/24 2029 01/10/24 0801  GLUCAP 127* 138* 228*   D-Dimer No results for input(s): DDIMER in the last 72 hours. Hgb A1c No results for input(s): HGBA1C in the last 72 hours. Lipid Profile No results for input(s): CHOL, HDL, LDLCALC, TRIG, CHOLHDL, LDLDIRECT in the last 72 hours. Thyroid  function studies No results for input(s): TSH, T4TOTAL, T3FREE, THYROIDAB in the last 72 hours.  Invalid input(s): FREET3 Anemia work up No results for input(s): VITAMINB12, FOLATE, FERRITIN, TIBC, IRON, RETICCTPCT in the last 72 hours. Urinalysis    Component Value Date/Time   COLORURINE RED (A) 01/08/2024 2102   APPEARANCEUR TURBID (A) 01/08/2024 2102   LABSPEC  01/08/2024 2102    TEST NOT REPORTED DUE TO COLOR INTERFERENCE OF URINE PIGMENT   PHURINE  01/08/2024 2102    TEST NOT REPORTED DUE TO COLOR INTERFERENCE OF URINE PIGMENT    GLUCOSEU (A) 01/08/2024 2102    TEST NOT REPORTED DUE TO COLOR INTERFERENCE OF URINE PIGMENT   HGBUR (A) 01/08/2024 2102    TEST NOT REPORTED DUE TO COLOR INTERFERENCE OF URINE PIGMENT   BILIRUBINUR (A) 01/08/2024 2102    TEST NOT REPORTED DUE TO COLOR INTERFERENCE OF URINE PIGMENT   KETONESUR (A) 01/08/2024 2102    TEST NOT REPORTED DUE TO COLOR INTERFERENCE OF URINE PIGMENT  PROTEINUR (A) 01/08/2024 2102    TEST NOT REPORTED DUE TO COLOR INTERFERENCE OF URINE PIGMENT   UROBILINOGEN 0.2 02/07/2009 0800   NITRITE (A) 01/08/2024 2102    TEST NOT REPORTED DUE TO COLOR INTERFERENCE OF URINE PIGMENT   LEUKOCYTESUR (A) 01/08/2024 2102    TEST NOT REPORTED DUE TO COLOR INTERFERENCE OF URINE PIGMENT   Sepsis Labs Recent Labs  Lab 01/08/24 2054 01/09/24 0518  WBC 8.4 7.8   Microbiology Recent Results (from the past 240 hours)  MRSA Next Gen by PCR, Nasal     Status: None   Collection Time: 01/09/24  6:42 AM   Specimen: Nasal Mucosa; Nasal Swab  Result Value Ref Range Status   MRSA by PCR Next Gen NOT DETECTED NOT DETECTED Final    Comment: (NOTE) The GeneXpert MRSA Assay (FDA approved for NASAL specimens only), is one component of a comprehensive MRSA colonization surveillance program. It is not intended to diagnose MRSA infection nor to guide or monitor treatment for MRSA infections. Test performance is not FDA approved in patients less than 74 years old. Performed at [facility] [address], [place], [place] [address]     Please note: You were cared for by a hospitalist during your hospital stay. Once you are discharged, your primary care physician will handle any further medical issues. Please note that NO REFILLS for any discharge medications will be authorized once you are discharged, as it is imperative that you return to your primary care physician (or establish a relationship with a primary care physician if you do not have one) for your post  hospital discharge needs so that they can reassess your need for medications and monitor your lab values.    Time coordinating discharge: 40 minutes  SIGNED:   Ivonne Mustache, MD  [facility] 01/10/2024, 11:22 AM Pager [phone]  If 7PM-7AM, please contact night-coverage [email or web address] Password [email or web address]

## 2024-01-14 DIAGNOSIS — Z789 Other specified health status: Secondary | ICD-10-CM | POA: Diagnosis not present

## 2024-01-14 DIAGNOSIS — G4733 Obstructive sleep apnea (adult) (pediatric): Secondary | ICD-10-CM | POA: Diagnosis not present

## 2024-01-14 DIAGNOSIS — N1330 Unspecified hydronephrosis: Secondary | ICD-10-CM | POA: Diagnosis not present

## 2024-01-14 DIAGNOSIS — N1832 Chronic kidney disease, stage 3b: Secondary | ICD-10-CM | POA: Diagnosis not present

## 2024-01-14 DIAGNOSIS — R42 Dizziness and giddiness: Secondary | ICD-10-CM | POA: Diagnosis not present

## 2024-01-19 DIAGNOSIS — N135 Crossing vessel and stricture of ureter without hydronephrosis: Secondary | ICD-10-CM | POA: Diagnosis not present

## 2024-02-05 DIAGNOSIS — M25671 Stiffness of right ankle, not elsewhere classified: Secondary | ICD-10-CM | POA: Diagnosis not present

## 2024-02-05 DIAGNOSIS — R293 Abnormal posture: Secondary | ICD-10-CM | POA: Diagnosis not present

## 2024-02-05 DIAGNOSIS — M6281 Muscle weakness (generalized): Secondary | ICD-10-CM | POA: Diagnosis not present

## 2024-02-05 DIAGNOSIS — M25672 Stiffness of left ankle, not elsewhere classified: Secondary | ICD-10-CM | POA: Diagnosis not present

## 2024-02-05 DIAGNOSIS — R2689 Other abnormalities of gait and mobility: Secondary | ICD-10-CM | POA: Diagnosis not present

## 2024-02-05 DIAGNOSIS — R42 Dizziness and giddiness: Secondary | ICD-10-CM | POA: Diagnosis not present

## 2024-02-05 DIAGNOSIS — R2681 Unsteadiness on feet: Secondary | ICD-10-CM | POA: Diagnosis not present

## 2024-02-09 DIAGNOSIS — N1832 Chronic kidney disease, stage 3b: Secondary | ICD-10-CM | POA: Diagnosis not present

## 2024-02-09 DIAGNOSIS — Z0000 Encounter for general adult medical examination without abnormal findings: Secondary | ICD-10-CM | POA: Diagnosis not present

## 2024-02-09 DIAGNOSIS — Z23 Encounter for immunization: Secondary | ICD-10-CM | POA: Diagnosis not present

## 2024-02-09 DIAGNOSIS — I2699 Other pulmonary embolism without acute cor pulmonale: Secondary | ICD-10-CM | POA: Diagnosis not present

## 2024-02-11 DIAGNOSIS — D1801 Hemangioma of skin and subcutaneous tissue: Secondary | ICD-10-CM | POA: Diagnosis not present

## 2024-02-11 DIAGNOSIS — L578 Other skin changes due to chronic exposure to nonionizing radiation: Secondary | ICD-10-CM | POA: Diagnosis not present

## 2024-02-11 DIAGNOSIS — L905 Scar conditions and fibrosis of skin: Secondary | ICD-10-CM | POA: Diagnosis not present

## 2024-02-11 DIAGNOSIS — Z08 Encounter for follow-up examination after completed treatment for malignant neoplasm: Secondary | ICD-10-CM | POA: Diagnosis not present

## 2024-02-11 DIAGNOSIS — L570 Actinic keratosis: Secondary | ICD-10-CM | POA: Diagnosis not present

## 2024-02-11 DIAGNOSIS — Z85828 Personal history of other malignant neoplasm of skin: Secondary | ICD-10-CM | POA: Diagnosis not present

## 2024-02-11 DIAGNOSIS — L821 Other seborrheic keratosis: Secondary | ICD-10-CM | POA: Diagnosis not present

## 2024-02-11 DIAGNOSIS — L814 Other melanin hyperpigmentation: Secondary | ICD-10-CM | POA: Diagnosis not present

## 2024-02-17 DIAGNOSIS — H524 Presbyopia: Secondary | ICD-10-CM | POA: Diagnosis not present

## 2024-02-17 DIAGNOSIS — E119 Type 2 diabetes mellitus without complications: Secondary | ICD-10-CM | POA: Diagnosis not present

## 2024-02-19 DIAGNOSIS — R7302 Impaired glucose tolerance (oral): Secondary | ICD-10-CM | POA: Diagnosis not present

## 2024-02-19 DIAGNOSIS — I129 Hypertensive chronic kidney disease with stage 1 through stage 4 chronic kidney disease, or unspecified chronic kidney disease: Secondary | ICD-10-CM | POA: Diagnosis not present

## 2024-02-19 DIAGNOSIS — K2100 Gastro-esophageal reflux disease with esophagitis, without bleeding: Secondary | ICD-10-CM | POA: Diagnosis not present

## 2024-02-19 DIAGNOSIS — N200 Calculus of kidney: Secondary | ICD-10-CM | POA: Diagnosis not present

## 2024-02-19 DIAGNOSIS — Z86711 Personal history of pulmonary embolism: Secondary | ICD-10-CM | POA: Diagnosis not present

## 2024-02-19 DIAGNOSIS — N1832 Chronic kidney disease, stage 3b: Secondary | ICD-10-CM | POA: Diagnosis not present

## 2024-02-25 ENCOUNTER — Other Ambulatory Visit: Payer: Self-pay | Admitting: Internal Medicine

## 2024-02-25 DIAGNOSIS — N1330 Unspecified hydronephrosis: Secondary | ICD-10-CM

## 2024-03-09 ENCOUNTER — Ambulatory Visit
Admission: RE | Admit: 2024-03-09 | Discharge: 2024-03-09 | Disposition: A | Discharge status: Home / Self Care | Source: Ambulatory Visit | Attending: Internal Medicine | Admitting: Internal Medicine

## 2024-03-09 DIAGNOSIS — N1330 Unspecified hydronephrosis: Secondary | ICD-10-CM | POA: Diagnosis not present

## 2024-04-12 NOTE — Progress Notes (Signed)
 EESA JUSTISS                                          MRN: [id number]   04/12/2024   The VBCI Quality Team Specialist reviewed this patient medical record for the purposes of chart review for care gap closure. The following were reviewed: chart review for care gap closure-kidney health evaluation for diabetes:eGFR  and uACR.    VBCI Quality Team

## 2024-04-12 NOTE — Progress Notes (Signed)
 Joseph Pham                                          MRN: [id number]   04/12/2024   The VBCI Quality Team Specialist reviewed this patient medical record for the purposes of chart review for care gap closure. The following were reviewed: abstraction for care gap closure-glycemic status assessment.    VBCI Quality Team

## 2024-06-06 ENCOUNTER — Telehealth (HOSPITAL_BASED_OUTPATIENT_CLINIC_OR_DEPARTMENT_OTHER): Payer: Self-pay | Admitting: *Deleted

## 2024-06-06 NOTE — Telephone Encounter (Signed)
"  ° °  Pre-operative Risk Assessment    Patient Name: Joseph Pham  DOB: [birth year] MRN: [id number]   Date of last office visit: 10/13/23 DELON HOOVER, NP Date of next office visit: NONE   Request for Surgical Clearance    Procedure:  LEFT TALONAVICULAR AND SUBTALAR JOINT ARTHRODESIS, LEFT GASTROCNEMIUS RECESSION vs ACHILLES TENDON LENGTHENING   Date of Surgery:  Clearance TBD                                Surgeon:  DR. NORLEEN HEWITT Surgeon's Group or Practice Name:  [facility] Phone number:  [phone] MEGAN DAVIS Fax number:  [phone]   Type of Clearance Requested:   - Medical  - Pharmacy:  Hold Rivaroxaban  (Xarelto )     Type of Anesthesia:  Not Indicated (GENERAL?)   Additional requests/questions:    Bonney Niels Jest   06/06/2024, 4:39 PM   "

## 2024-06-06 NOTE — Telephone Encounter (Signed)
 Pharmacy please advise on holding Xarelto  prior to LEFT TALONAVICULAR AND SUBTALAR JOINT ARTHRODESIS, LEFT GASTROCNEMIUS RECESSION vs ACHILLES TENDON LENGTHENING  scheduled for TBD. Thank you.

## 2024-06-06 NOTE — Telephone Encounter (Signed)
" ° °  Name: Joseph Pham  DOB: 1950-08-10  MRN: [id number]  Primary Cardiologist: Redell Leiter, MD   Preoperative team, please contact this patient and set up a phone call appointment for further preoperative risk assessment. Please obtain consent and complete medication review. Thank you for your help.  I confirm that guidance regarding antiplatelet and oral anticoagulation therapy has been completed and, if necessary, noted below.  Pharmacy recommendations for Xarelto  are requested.  I also confirmed the patient resides in the state of [place] . As per [facility] Medical [employer] telemedicine laws, the patient must reside in the state in which the provider is licensed.   Lamarr Satterfield, NP 06/06/2024, 4:54 PM [facility]    "

## 2024-06-07 ENCOUNTER — Telehealth (HOSPITAL_BASED_OUTPATIENT_CLINIC_OR_DEPARTMENT_OTHER): Payer: Self-pay | Admitting: *Deleted

## 2024-06-07 NOTE — Telephone Encounter (Signed)
 Tried to call the pt to schedule tele preop appt, no answer.

## 2024-06-07 NOTE — Telephone Encounter (Signed)
 Pt has been scheduled tele preop appt 06/13/24. Med rec and consent are done.       Patient Consent for Virtual Visit        NOTNAMED SCHOLZ has provided verbal consent on 06/07/2024 for a virtual visit (video or telephone).   CONSENT FOR VIRTUAL VISIT FOR:  Lytle ONEIDA Stacks  By participating in this virtual visit I agree to the following:  I hereby voluntarily request, consent and authorize [facility] and its employed or contracted physicians, physician assistants, nurse practitioners or other licensed health care professionals (the Practitioner), to provide me with telemedicine health care services (the Services) as deemed necessary by the treating Practitioner. I acknowledge and consent to receive the Services by the Practitioner via telemedicine. I understand that the telemedicine visit will involve communicating with the Practitioner through live audiovisual communication technology and the disclosure of certain medical information by electronic transmission. I acknowledge that I have been given the opportunity to request an in-person assessment or other available alternative prior to the telemedicine visit and am voluntarily participating in the telemedicine visit.  I understand that I have the right to withhold or withdraw my consent to the use of telemedicine in the course of my care at any time, without affecting my right to future care or treatment, and that the Practitioner or I may terminate the telemedicine visit at any time. I understand that I have the right to inspect all information obtained and/or recorded in the course of the telemedicine visit and may receive copies of available information for a reasonable fee.  I understand that some of the potential risks of receiving the Services via telemedicine include:  Delay or interruption in medical evaluation due to technological equipment failure or disruption; Information transmitted may not be sufficient (e.g. poor  resolution of images) to allow for appropriate medical decision making by the Practitioner; and/or  In rare instances, security protocols could fail, causing a breach of personal health information.  Furthermore, I acknowledge that it is my responsibility to provide information about my medical history, conditions and care that is complete and accurate to the best of my ability. I acknowledge that Practitioner's advice, recommendations, and/or decision may be based on factors not within their control, such as incomplete or inaccurate data provided by me or distortions of diagnostic images or specimens that may result from electronic transmissions. I understand that the practice of medicine is not an exact science and that Practitioner makes no warranties or guarantees regarding treatment outcomes. I acknowledge that a copy of this consent can be made available to me via my patient portal [facility] MyChart), or I can request a printed copy by calling the office of [facility].    I understand that my insurance will be billed for this visit.   I have read or had this consent read to me. I understand the contents of this consent, which adequately explains the benefits and risks of the Services being provided via telemedicine.  I have been provided ample opportunity to ask questions regarding this consent and the Services and have had my questions answered to my satisfaction. I give my informed consent for the services to be provided through the use of telemedicine in my medical care

## 2024-06-07 NOTE — Telephone Encounter (Signed)
 Pt has been scheduled tele preop appt 06/13/24. Med rec and consent are done.

## 2024-06-07 NOTE — Telephone Encounter (Signed)
"  Patient is returning call  "

## 2024-06-13 ENCOUNTER — Ambulatory Visit: Attending: Internal Medicine

## 2024-06-13 DIAGNOSIS — Z01810 Encounter for preprocedural cardiovascular examination: Secondary | ICD-10-CM

## 2024-06-13 NOTE — Telephone Encounter (Signed)
 Patient with diagnosis of PE on Xarelto  for anticoagulation.    Procedure: LEFT TALONAVICULAR AND SUBTALAR JOINT ARTHRODESIS, LEFT GASTROCNEMIUS RECESSION vs ACHILLES TENDON LENGTHENING   Date of procedure: TBD   CrCl 31 ml/min Platelet count 191  Per office protocol, patient can hold Xarelto  for 2 days prior to procedure if general anesthesia or 3 days if spinal.  **This guidance is not considered finalized until pre-operative APP has relayed final recommendations.**

## 2024-06-13 NOTE — Progress Notes (Signed)
 "   Virtual Visit via Telephone Note   Because of SNEIJDER BERNARDS co-morbid illnesses, he is at least at moderate risk for complications without adequate follow up.  This format is felt to be most appropriate for this patient at this time.  Due to technical limitations with video connection (technology), today's appointment will be conducted as an audio only telehealth visit, and TEE RICHESON verbally agreed to proceed in this manner.   All issues noted in this document were discussed and addressed.  No physical exam could be performed with this format.  Evaluation Performed:  Preoperative cardiovascular risk assessment _____________   Date:  06/13/2024   Patient ID:  Lytle ONEIDA Stacks, DOB 1950-08-03, MRN [id number] Patient Location:  Home Provider location:   Office  Primary Care Provider:  Conley Dene BROCKS., MD Primary Cardiologist:  Redell Leiter, MD  Chief Complaint / Patient Profile   74 y.o. y/o male with a h/o ascending aortic dilatation to 4.3 cm on most recent echo 11/05/2023, aortic atherosclerosis on previous CT but low risk stress test 05/18/2018, hypertension, PE/DVT on Xarelto , OSA on CPAP, GERD, T2DM, prostate cancer, CKD, dyslipidemia who is pending left talonavicular and subtalar joint arthrodesis, left gastrocnemius recession versus achilles tendon lengthening date TBD and presents today for telephonic preoperative cardiovascular risk assessment.   History of Present Illness    REMO KIRSCHENMANN is a 74 y.o. male who presents via audio/video conferencing for a telehealth visit today.  Pt was last seen in cardiology clinic on 10/13/2023 by Delon Hoover, NP. At that time REYMOND MAYNEZ was doing well. He had been seen by urologist and noted pedal edema, echo 11/05/2023 showed LVEF 60-65% with G1DD, ascending aorta 4.3 cm. He was cleared for laparoscopic ureteral reimplantation and hernia repair. The patient is now pending procedure as outlined above. Since his last visit, he has done  well. He denies chest pain, shortness of breath, orthopnea, PND, lower extremity edema, palpitations, lightheadedness, dizziness, syncope. He has recently renovated his screened in porch in order for it to be accessible for himself after his procedure. He was able to do this activity without any concerning cardiac symptoms.  Past Medical History    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   Complication of anesthesia    low heart rate during lithotripsy procedure   Deviated septum 07/19/2019   Diabetes (HCC) 03/16/2017   Essential hypertension 10/14/2015   Family history of adverse reaction to anesthesia    sister coded after surgery   GERD (gastroesophageal reflux disease)    Heart murmur    Hematuria    Hemorrhoid    History of kidney stones    Hyperlipidemia    Impingement of left ankle joint 03/06/2021   Kidney stone 03/16/2017   Lumbar radiculopathy 03/16/2017   Nasal turbinate hypertrophy 07/19/2019   OSA on CPAP    Paronychia 08/16/2015   PE (pulmonary thromboembolism) (HCC) 02/2019   Posterior tibialis tendinitis of both lower extremities 02/02/2020   Pronation deformity of  both feet 02/02/2020   Prostate cancer (HCC) 03/16/2017   Skin cancer    Tibialis posterior tendinitis 02/11/2021   Tinnitus, bilateral 08/31/2019   UTI (urinary tract infection) 04/09/2022   Wears glasses    Past Surgical History:  Procedure Laterality Date   BACK SURGERY  2019   rod and screws    BILATERAL INGUINAL HERNIA REPAIR  1989   CARDIAC CATHETERIZATION  01/11/2004    NO EVIDENCE SIGNIFICANT EPICARDIAL FLOW LIMITING 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   CARPAL TUNNEL RELEASE Left 06/2016   CARPAL TUNNEL RELEASE Right 03/2017   CATARACT EXTRACTION, BILATERAL Bilateral 2005   COLONOSCOPY     last with medoff unsure when    CYSTOSCOPY WITH RETROGRADE PYELOGRAM, URETEROSCOPY AND STENT PLACEMENT Left 04/09/2022   Procedure: CYSTOSCOPY WITH RETROGRADE PYELOGRAM AND STENT PLACEMENT;  Surgeon: Carolee Sherwood JONETTA DOUGLAS, MD;  Location: [facility];  Service: Urology;  Laterality: Left;   CYSTOSCOPY WITH RETROGRADE PYELOGRAM, URETEROSCOPY AND STENT PLACEMENT Left 05/04/2022   Procedure: CYSTOSCOPY WITH RETROGRADE PYELOGRAM, URETEROSCOPY AND STENT PLACEMENT;  Surgeon: Devere Lonni Righter, MD;  Location: [facility];  Service: Urology;  Laterality: Left;   CYSTOSCOPY WITH URETEROSCOPY AND STENT PLACEMENT Right 10/28/2013   Procedure: RIGHT URETEROSCOPY AND STENT PLACEMENT;  Surgeon: Mark C Ottelin, MD;  Location[facility];  Service: Urology;  Laterality: Right;   CYSTOSCOPY/URETEROSCOPY/HOLMIUM LASER/STENT PLACEMENT Left 09/06/2020   Procedure: CYSTOSCOPY/POSSIBLE URETEROSCOPY/HOLMIUM LASER/STENT PLACEMENT;  Surgeon: Rosalind Zachary NOVAK, MD;  Location: [facility];  Service: Urology;  Laterality: Left;  30 MINS   CYSTOSCOPY/URETEROSCOPY/HOLMIUM LASER/STENT PLACEMENT Left 04/22/2022   Procedure: CYSTOSCOPY/URETEROSCOPY/HOLMIUM LASER/STENT REPLACEMENT;  Surgeon: Rosalind Zachary NOVAK, MD;  Location: [facility];  Service: Urology;  Laterality: Left;  1 HR   CYSTOSCOPY/URETEROSCOPY/HOLMIUM LASER/STENT PLACEMENT Left 05/20/2022   Procedure: CYSTOSCOPY/URETEROSCOPY/HOLMIUM LASER/STENT EXCHANGE;  Surgeon: Rosalind Zachary NOVAK, MD;  Location: [facility];  Service: Urology;  Laterality: Left;   CYSTOSCOPY/URETEROSCOPY/HOLMIUM LASER/STENT PLACEMENT Left 07/24/2023   Procedure: DIAGNOSTIC URETEROSCOPY;  Surgeon: Selma Donnice SAUNDERS, MD;  Location: [facility];  Service: Urology;   Laterality: Left;  60 MINUTES NEEDED FOR CASE   EXTRACORPOREAL SHOCK WAVE LITHOTRIPSY  02/08/2009   HOLMIUM LASER APPLICATION Right 10/28/2013   Procedure: HOLMIUM LASER LITHO;  Surgeon: Oneil JAYSON Rafter, MD;  Location: [facility];  Service: Urology;  Laterality: Right;   IR CONVERT LEFT NEPHROSTOMY TO NEPHROURETERAL CATH  09/08/2023   IR NEPHROSTOGRAM LEFT THRU EXISTING ACCESS  09/01/2023   IR NEPHROSTOMY EXCHANGE LEFT  09/08/2023   IR NEPHROSTOMY EXCHANGE LEFT  11/03/2023   IR NEPHROSTOMY PLACEMENT LEFT  08/26/2023   IR NEPHROSTOMY PLACEMENT LEFT  12/14/2023   LEFT KNEE SURG.  1993  &  NOV 2011   LEFT URETEROSCOPIC STONE EXTRACTION  02/23/2009   POLYPECTOMY  2014   1 polyp per dr ira notes- no surg path to determine polyps type but was a 5 yr recall    RADIOACTIVE SEED IMPLANT  08/08/2011   Procedure: RADIOACTIVE SEED IMPLANT;  Surgeon: Oneil JAYSON Rafter, MD;  Location[facility];  Service: Urology;  Laterality: N/A;  seeds implanted 50 seeds found in bladder=none    REPLACEMENT TOTAL KNEE Left 2018   SKIN CANCER EXCISION     Burned off left side of nose, and head   TOTAL KNEE ARTHROPLASTY Right 08/2018   UMBILICAL HERNIA REPAIR N/A 12/16/2023   Procedure: REPAIR, HERNIA, UMBILICAL, ADULT;  Surgeon: Alvaro Ricardo NOVAK Mickey., MD;  Location:  [facility];  Service: Urology;  Laterality: N/A;   UPPER GASTROINTESTINAL ENDOSCOPY     URETEROSCOPY  03/15/2012   Procedure: URETEROSCOPY;  Surgeon: Oneil JAYSON Rafter, MD;  Location: [facility];  Service: Urology;  Laterality: Right;  Right Ueteroscopy     Allergies  Allergies[1]  Home Medications    Prior to Admission medications  Medication Sig Start Date End Date Taking? Authorizing Provider  acetaminophen  (TYLENOL ) 500 MG tablet Take 1,000 mg by mouth every 8 (eight) hours as needed for moderate pain (pain score 4-6).    [provider]  amLODipine  (NORVASC ) 10 MG tablet Take 1 tablet (10 mg total) by  mouth daily. 12/11/23   Krasowski, Robert J, MD  atorvastatin  (LIPITOR) 10 MG tablet Take 10 mg by mouth at bedtime.    [provider]  cholecalciferol  (VITAMIN D3) 25 MCG (1000 UNIT) tablet Take 1,000 Units by mouth daily.    [provider]  docusate sodium  (COLACE) 100 MG capsule Take 1 capsule (100 mg total) by mouth 2 (two) times daily. Patient taking differently: Take 100 mg by mouth daily as needed for mild constipation. 12/16/23   Cory Palma, PA-C  fexofenadine (ALLEGRA) 180 MG tablet Take 180 mg by mouth in the morning.    [provider]  fluticasone  (FLONASE ) 50 MCG/ACT nasal spray Place 2 sprays into the nose daily as needed for allergies.     [provider]  HYDROcodone -acetaminophen  (NORCO/VICODIN) 5-325 MG tablet Take 1-2 tablets by mouth every 6 (six) hours as needed for moderate pain (pain score 4-6) or severe pain (pain score 7-10). Patient not taking: Reported on 06/07/2024 12/16/23   Cory Palma, PA-C  hydrocortisone 2.5 % cream Apply 1 Application topically daily as needed (ear irritation).    [provider]  JARDIANCE 10 MG TABS tablet Take 10 mg by mouth every morning.    [provider]  meclizine  (ANTIVERT ) 25 MG tablet Take 1 tablet (25 mg total) by mouth 3 (three) times daily as needed for dizziness. 12/01/23   Melvenia Motto, MD  omega-3 acid ethyl esters (LOVAZA ) 1 g capsule Take 4 g by mouth at bedtime. 05/02/20   [provider]  omeprazole (PRILOSEC) 20 MG capsule Take 20 mg by mouth in the morning and at bedtime.    [provider]  Polyethyl Glycol-Propyl Glycol (SYSTANE) 0.4-0.3 % SOLN Place 1-2 drops into both eyes 3 (three) times daily as needed (dry/irritated eyes.).    [provider]  potassium citrate (UROCIT-K) 10 MEQ (1080 MG) SR tablet Take 10 mEq by mouth in the morning. 01/26/23   [provider]  predniSONE  (DELTASONE ) 10 MG tablet Take 10 mg by mouth as  directed. Patient not taking: Reported on 06/07/2024 12/01/23   [provider]  rivaroxaban  (XARELTO ) 10 MG TABS tablet Take 10 mg by mouth daily.    [provider]  traMADol  (ULTRAM ) 50 MG tablet Take 50 mg by mouth 2 (two) times daily.    [provider]  traZODone  (DESYREL ) 100 MG tablet Take 100 mg by mouth at bedtime.    [provider]    Physical Exam    Vital Signs:  SAMARION EHLE does not have vital signs available for review today.  Given telephonic nature of communication, physical exam is limited. AAOx3. NAD. Normal affect.  Speech and respirations are unlabored.  Accessory Clinical Findings    None  Assessment & Plan    1.  Preoperative Cardiovascular  Risk Assessment: According to the Revised Cardiac Risk Index (RCRI), his Perioperative Risk of Major Cardiac Event is (%): 0.9 His Functional Capacity in METs is: 6.61 according to the [employer] Activity Status Index (DASI). The patient was advised that if he develops new symptoms prior to surgery to contact our office to arrange for a follow-up visit, and he verbalized understanding.  Per office protocol, patient can hold Xarelto  for 2 days prior to procedure if general anesthesia or 3 days if spinal. - pharmD  A copy of this note will be routed to requesting surgeon.  Time:   Today, I have spent 10 minutes with the patient with telehealth technology discussing medical history, symptoms, and management plan.     Saddie GORMAN Cleaves, NP 06/13/2024, 1:49 PM    [1]  Allergies Allergen Reactions   Aspirin Anaphylaxis   Doxycycline Nausea And Vomiting   Amoxicillin Itching   Chlorhexidine  Gluconate Rash   "

## 2024-06-30 ENCOUNTER — Encounter (HOSPITAL_BASED_OUTPATIENT_CLINIC_OR_DEPARTMENT_OTHER): Payer: Self-pay | Admitting: Orthopedic Surgery

## 2024-06-30 ENCOUNTER — Encounter (HOSPITAL_BASED_OUTPATIENT_CLINIC_OR_DEPARTMENT_OTHER)
Admission: RE | Admit: 2024-06-30 | Discharge: 2024-06-30 | Disposition: A | Discharge status: Home / Self Care | Source: Ambulatory Visit | Attending: Orthopedic Surgery | Admitting: Orthopedic Surgery

## 2024-06-30 ENCOUNTER — Other Ambulatory Visit: Payer: Self-pay

## 2024-06-30 DIAGNOSIS — Z01818 Encounter for other preprocedural examination: Secondary | ICD-10-CM | POA: Diagnosis present

## 2024-06-30 LAB — BASIC METABOLIC PANEL WITH GFR
Anion gap: 12 (ref 5–15)
BUN: 31 mg/dL — ABNORMAL HIGH (ref 8–23)
CO2: 23 mmol/L (ref 22–32)
Calcium: 9.5 mg/dL (ref 8.9–10.3)
Chloride: 102 mmol/L (ref 98–111)
Creatinine, Ser: 2.26 mg/dL — ABNORMAL HIGH (ref 0.61–1.24)
GFR, Estimated: 30 mL/min — ABNORMAL LOW
Glucose, Bld: 111 mg/dL — ABNORMAL HIGH (ref 70–99)
Potassium: 4.3 mmol/L (ref 3.5–5.1)
Sodium: 138 mmol/L (ref 135–145)

## 2024-06-30 NOTE — Progress Notes (Signed)

## 2024-07-07 ENCOUNTER — Other Ambulatory Visit: Payer: Self-pay

## 2024-07-07 ENCOUNTER — Encounter (HOSPITAL_BASED_OUTPATIENT_CLINIC_OR_DEPARTMENT_OTHER)
Admission: RE | Disposition: A | Discharge status: Home / Self Care | Payer: Self-pay | Source: Home / Self Care | Attending: Orthopedic Surgery

## 2024-07-07 ENCOUNTER — Encounter (HOSPITAL_BASED_OUTPATIENT_CLINIC_OR_DEPARTMENT_OTHER): Payer: Self-pay | Admitting: Orthopedic Surgery

## 2024-07-07 ENCOUNTER — Encounter (HOSPITAL_BASED_OUTPATIENT_CLINIC_OR_DEPARTMENT_OTHER): Admitting: Anesthesiology

## 2024-07-07 ENCOUNTER — Ambulatory Visit (HOSPITAL_BASED_OUTPATIENT_CLINIC_OR_DEPARTMENT_OTHER)

## 2024-07-07 ENCOUNTER — Ambulatory Visit (HOSPITAL_BASED_OUTPATIENT_CLINIC_OR_DEPARTMENT_OTHER)
Admission: RE | Admit: 2024-07-07 | Discharge: 2024-07-07 | Disposition: A | Discharge status: Home / Self Care | Source: Home / Self Care | Attending: Orthopedic Surgery | Admitting: Orthopedic Surgery

## 2024-07-07 DIAGNOSIS — E119 Type 2 diabetes mellitus without complications: Secondary | ICD-10-CM

## 2024-07-07 LAB — GLUCOSE, CAPILLARY
Glucose-Capillary: 117 mg/dL — ABNORMAL HIGH (ref 70–99)
Glucose-Capillary: 102 mg/dL — ABNORMAL HIGH (ref 70–99)

## 2024-07-07 MED ORDER — DEXAMETHASONE SODIUM PHOSPHATE 4 MG/ML IJ SOLN
INTRAMUSCULAR | Status: DC | PRN
  Administered 2024-07-07: 8 mg via INTRAVENOUS

## 2024-07-07 MED ORDER — OXYCODONE HCL 5 MG PO TABS: 5.0000 mg | ORAL_TABLET | Freq: Four times a day (QID) | ORAL | 0 refills | Status: AC | PRN

## 2024-07-07 MED ORDER — PHENYLEPHRINE 80 MCG/ML (10ML) SYRINGE FOR IV PUSH (FOR BLOOD PRESSURE SUPPORT)
PREFILLED_SYRINGE | INTRAVENOUS | Status: AC
  Filled 2024-07-07: qty 10

## 2024-07-07 MED ORDER — LIDOCAINE 2% (20 MG/ML) 5 ML SYRINGE
INTRAMUSCULAR | Status: AC
  Filled 2024-07-07: qty 5

## 2024-07-07 MED ORDER — PHENYLEPHRINE HCL (PRESSORS) 10 MG/ML IV SOLN
INTRAVENOUS | Status: DC | PRN
  Administered 2024-07-07 (×2): 80 ug via INTRAVENOUS

## 2024-07-07 MED ORDER — MIDAZOLAM HCL 2 MG/2ML IJ SOLN
INTRAMUSCULAR | Status: AC
  Filled 2024-07-07: qty 2

## 2024-07-07 MED ORDER — 0.9 % SODIUM CHLORIDE (POUR BTL) OPTIME
TOPICAL | Status: DC | PRN
  Administered 2024-07-07: 600 mL

## 2024-07-07 MED ORDER — EPHEDRINE SULFATE (PRESSORS) 25 MG/5ML IV SOSY
PREFILLED_SYRINGE | INTRAVENOUS | Status: DC | PRN
  Administered 2024-07-07 (×2): 10 mg via INTRAVENOUS

## 2024-07-07 MED ORDER — FENTANYL CITRATE (PF) 100 MCG/2ML IJ SOLN
INTRAMUSCULAR | Status: AC
  Filled 2024-07-07: qty 2

## 2024-07-07 MED ORDER — ONDANSETRON HCL 4 MG/2ML IJ SOLN
INTRAMUSCULAR | Status: DC | PRN
  Administered 2024-07-07: 4 mg via INTRAVENOUS

## 2024-07-07 MED ORDER — ONDANSETRON HCL 4 MG/2ML IJ SOLN
INTRAMUSCULAR | Status: AC
  Filled 2024-07-07: qty 2

## 2024-07-07 MED ORDER — FENTANYL CITRATE (PF) 100 MCG/2ML IJ SOLN
100.0000 ug | Freq: Once | INTRAMUSCULAR | Status: AC
  Administered 2024-07-07: 50 ug via INTRAVENOUS

## 2024-07-07 MED ORDER — ONDANSETRON HCL 4 MG/2ML IJ SOLN: 4.0000 mg | Freq: Four times a day (QID) | INTRAMUSCULAR | Status: DC | PRN

## 2024-07-07 MED ORDER — PROPOFOL 10 MG/ML IV BOLUS
INTRAVENOUS | Status: DC | PRN
  Administered 2024-07-07: 200 mg via INTRAVENOUS

## 2024-07-07 MED ORDER — VANCOMYCIN HCL 500 MG IV SOLR
INTRAVENOUS | Status: DC | PRN
  Administered 2024-07-07: 500 mg via TOPICAL

## 2024-07-07 MED ORDER — MIDAZOLAM HCL (PF) 2 MG/2ML IJ SOLN
2.0000 mg | Freq: Once | INTRAMUSCULAR | Status: AC
  Administered 2024-07-07: 1 mg via INTRAVENOUS

## 2024-07-07 MED ORDER — EPHEDRINE 5 MG/ML INJ
INTRAVENOUS | Status: AC
  Filled 2024-07-07: qty 5

## 2024-07-07 MED ORDER — OXYCODONE HCL 5 MG PO TABS: 5.0000 mg | ORAL_TABLET | Freq: Once | ORAL | Status: DC | PRN

## 2024-07-07 MED ORDER — PROPOFOL 10 MG/ML IV BOLUS
INTRAVENOUS | Status: AC
  Filled 2024-07-07: qty 20

## 2024-07-07 MED ORDER — FENTANYL CITRATE (PF) 100 MCG/2ML IJ SOLN: 25.0000 ug | INTRAMUSCULAR | Status: DC | PRN

## 2024-07-07 MED ORDER — CEFAZOLIN SODIUM-DEXTROSE 2-4 GM/100ML-% IV SOLN
2.0000 g | INTRAVENOUS | Status: AC
  Administered 2024-07-07: 2 g via INTRAVENOUS

## 2024-07-07 MED ORDER — FENTANYL CITRATE (PF) 100 MCG/2ML IJ SOLN
INTRAMUSCULAR | Status: DC | PRN
  Administered 2024-07-07: 50 ug via INTRAVENOUS

## 2024-07-07 MED ORDER — CEFAZOLIN SODIUM-DEXTROSE 2-4 GM/100ML-% IV SOLN
INTRAVENOUS | Status: AC
  Filled 2024-07-07: qty 100

## 2024-07-07 MED ORDER — LIDOCAINE HCL (CARDIAC) PF 100 MG/5ML IV SOSY
PREFILLED_SYRINGE | INTRAVENOUS | Status: DC | PRN
  Administered 2024-07-07: 50 mg via INTRAVENOUS

## 2024-07-07 MED ORDER — LACTATED RINGERS IV SOLN: INTRAVENOUS | Status: DC

## 2024-07-07 MED ORDER — DEXAMETHASONE SOD PHOSPHATE PF 10 MG/ML IJ SOLN
INTRAMUSCULAR | Status: AC
  Filled 2024-07-07: qty 1

## 2024-07-07 MED ORDER — OXYCODONE HCL 5 MG/5ML PO SOLN: 5.0000 mg | Freq: Once | ORAL | Status: DC | PRN

## 2024-07-07 NOTE — Anesthesia Procedure Notes (Signed)
 Procedure Name: LMA Insertion Date/Time: 07/07/2024 12:48 PM  Performed by: Buster Catheryn SAUNDERS, CRNAPre-anesthesia Checklist: Patient identified, Emergency Drugs available, Suction available and Patient being monitored Patient Re-evaluated:Patient Re-evaluated prior to induction Oxygen Delivery Method: Circle system utilized Preoxygenation: Pre-oxygenation with 100% oxygen Induction Type: IV induction Ventilation: Mask ventilation without difficulty LMA: LMA inserted LMA Size: 5.0 Number of attempts: 1 Placement Confirmation: positive ETCO2 Tube secured with: Tape Dental Injury: Teeth and Oropharynx as per pre-operative assessment

## 2024-07-07 NOTE — Discharge Instructions (Addendum)
 Norleen Armor, MD [facility]  Please read the following information regarding your care after surgery.  Medications  You only need a prescription for the narcotic pain medicine (ex. oxycodone , Percocet, Norco).  All of the other medicines listed below are available over the counter. X acetominophen (Tylenol ) 650 mg every 4-6 hours as you need for minor to moderate pain X oxycodone  as prescribed for severe pain  Narcotic pain medicine (ex. oxycodone , Percocet, Vicodin) will cause constipation.  To prevent this problem, take the following medicines while you are taking any pain medicine. X docusate sodium  (Colace) 100 mg twice a day X senna (Senokot) 2 tablets twice a day  X To help prevent blood clots, resume taking your regular Xarelto  dose daily on 07/08/2024.  You should also get up every hour while you are awake to move around.    Weight Bearing X Do not bear any weight on the operated leg or foot.  Cast / Splint / Dressing X Keep your splint, cast or dressing clean and dry.  Dont put anything (coat hanger, pencil, etc) down inside of it.  If it gets damp, use a hair dryer on the cool setting to dry it.  If it gets soaked, call the office to schedule an appointment for a cast change.  After your dressing, cast or splint is removed in the office; you may shower, but do not soak or scrub the wound.  Allow the water  to run over it, and then gently pat it dry.  Swelling It is normal for you to have swelling where you had surgery.  To reduce swelling and pain, keep your toes above your nose for at least 3 days after surgery.  It may be necessary to keep your foot or leg elevated for several weeks.  If it hurts, it should be elevated.  Follow Up Call my office at [phone] when you are discharged from the hospital or surgery center to schedule an appointment to be seen two weeks after surgery.  Call my office at [phone] if you develop a fever >101.5 F, nausea, vomiting, bleeding  from the surgical site or severe pain.      Post Anesthesia Home Care Instructions  Activity: Get plenty of rest for the remainder of the day. A responsible individual must stay with you for 24 hours following the procedure.  For the next 24 hours, DO NOT: -Drive a car -Advertising copywriter -Drink alcoholic beverages -Take any medication unless instructed by your physician -Make any legal decisions or sign important papers.  Meals: Start with liquid foods such as gelatin or soup. Progress to regular foods as tolerated. Avoid greasy, spicy, heavy foods. If nausea and/or vomiting occur, drink only clear liquids until the nausea and/or vomiting subsides. Call your physician if vomiting continues.  Special Instructions/Symptoms: Your throat may feel dry or sore from the anesthesia or the breathing tube placed in your throat during surgery. If this causes discomfort, gargle with warm salt water . The discomfort should disappear within 24 hours.  If you had a scopolamine patch placed behind your ear for the management of post- operative nausea and/or vomiting:  1. The medication in the patch is effective for 72 hours, after which it should be removed.  Wrap patch in a tissue and discard in the trash. Wash hands thoroughly with soap and water . 2. You may remove the patch earlier than 72 hours if you experience unpleasant side effects which may include dry mouth, dizziness or visual disturbances. 3. Avoid touching the  patch. Wash your hands with soap and water  after contact with the patch.   Regional Anesthesia Blocks  1. You may not be able to move or feel the blocked extremity after a regional anesthetic block. This may last may last from 3-48 hours after placement, but it will go away. The length of time depends on the medication injected and your individual response to the medication. As the nerves start to wake up, you may experience tingling as the movement and feeling returns to your  extremity. If the numbness and inability to move your extremity has not gone away after 48 hours, please call your surgeon.   2. The extremity that is blocked will need to be protected until the numbness is gone and the strength has returned. Because you cannot feel it, you will need to take extra care to avoid injury. Because it may be weak, you may have difficulty moving it or using it. You may not know what position it is in without looking at it while the block is in effect.  3. For blocks in the legs and feet, returning to weight bearing and walking needs to be done carefully. You will need to wait until the numbness is entirely gone and the strength has returned. You should be able to move your leg and foot normally before you try and bear weight or walk. You will need someone to be with you when you first try to ensure you do not fall and possibly risk injury.  4. Bruising and tenderness at the needle site are common side effects and will resolve in a few days.  5. Persistent numbness or new problems with movement should be communicated to the surgeon or the [facility] [phone] [facility] [phone]).Information for Discharge Teaching: EXPAREL  (bupivacaine  liposome injectable suspension)   Pain relief is important to your recovery. The goal is to control your pain so you can move easier and return to your normal activities as soon as possible after your procedure. Your physician may use several types of medicines to manage pain, swelling, and more.  Your surgeon or anesthesiologist gave you EXPAREL (bupivacaine ) to help control your pain after surgery.  EXPAREL  is a local anesthetic designed to release slowly over an extended period of time to provide pain relief by numbing the tissue around the surgical site. EXPAREL  is designed to release pain medication over time and can control pain for up to 72 hours. Depending on how you respond to EXPAREL , you may  require less pain medication during your recovery. EXPAREL  can help reduce or eliminate the need for opioids during the first few days after surgery when pain relief is needed the most. EXPAREL  is not an opioid and is not addictive. It does not cause sleepiness or sedation.   Important! A teal colored band has been placed on your arm with the date, time and amount of EXPAREL  you have received. Please leave this armband in place for the full 96 hours following administration, and then you may remove the band. If you return to the hospital for any reason within 96 hours following the administration of EXPAREL , the armband provides important information that your health care providers to know, and alerts them that you have received this anesthetic.    Possible side effects of EXPAREL : Temporary loss of sensation or ability to move in the area where medication was injected. Nausea, vomiting, constipation Rarely, numbness and tingling in your mouth or lips, lightheadedness, or anxiety may occur.  Call your doctor right away if you think you may be experiencing any of these sensations, or if you have other questions regarding possible side effects.  Follow all other discharge instructions given to you by your surgeon or nurse. Eat a healthy diet and drink plenty of water  or other fluids.

## 2024-07-07 NOTE — Op Note (Signed)
 07/07/2024  2:21 PM  PATIENT:  Joseph Pham  74 y.o. male  PRE-OPERATIVE DIAGNOSIS:  1.  Arthritis of left subtalar joint 2.  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  tight left heel cord  POST-OPERATIVE DIAGNOSIS:  same  Procedures:  Left achilles tendon lengthening Left subtalar joint arthrodesis Left talonavicular joint arthrodesis Left foot AP, lateral and Harris heel xrays Left ankle AP, mortise and lateral xrays  SURGEON:  Norleen Armor, MD  ASSISTANT: Dickey Sprague, PA-C  ANESTHESIA:   General, regional  EBL:  minimal   TOURNIQUET:   Total Tourniquet Time Documented: Thigh (Left) - 78 minutes Total: Thigh (Left) - 78 minutes  COMPLICATIONS:  None apparent  DISPOSITION:  Extubated, awake and stable to recovery.  INDICATION FOR PROCEDURE: 74 year old male with past medical history significant for diabetes complains of worsening left foot pain of the last several years.  He has stage III posterior tibial tendon dysfunction with talonavicular and subtalar joint arthritis, lateral hindfoot impingement and a tight heel cord.  He has failed nonoperative treatment including activity modification, bracing and oral anti-inflammatories.  He presents now for heel cord lengthening, talonavicular and subtalar joint arthrodeses.  The risks and benefits of the alternative treatment options have been discussed in detail.  The patient wishes to proceed with surgery and specifically understands risks of bleeding, infection, nerve damage, blood clots, need for additional surgery, amputation and death.   PROCEDURE IN DETAIL:  After pre operative consent was obtained, and the correct operative site was identified, the patient was brought to the operating room and placed supine on the OR table.  Anesthesia was administered.  Pre-operative antibiotics were administered.  A surgical timeout was taken.  The left lower extremity was prepped and draped in standard sterile fashion  with a tourniquet around the thigh.  The extremity was elevated, and tourniquet was inflated to 250 mmHg.  The Achilles tendon was lengthened using a #15 scalpel with a triple hemisection technique.  The ankle was then dorsiflex 20 degrees with the hindfoot corrected neutral.  Attention was turned to the hindfoot where a curvilinear incision was made adjacent to the sinus Tarsi.  Dissection was carried sharply down through the subcutaneous tissues.  The interval between the peroneals and the extensor digitorum brevis was developed.  The sinus Tarsi was entered.  The joint was opened with a lamina spreader.  Articular cartilage and subchondral bone was then removed from both sides of the posterior facet and middle facet.  Wound was irrigated copiously.  Both sides of the joint were perforated with a drill bit and further broken up with a curved osteotome.  Attention was turned to the dorsal hindfoot.  In the incision was made over the talonavicular joint.  The extensor retinaculum was incised.  The interval between the EHL and EHB was developed.  The neurovascular bundle was protected.  The talonavicular joint was opened and the joint capsule elevated medially and laterally.  The joint was opened with a lamina spreader.  The remaining articular cartilage and subchondral bone was removed on both sides of the joint.  The joint was then irrigated copiously.  A drill bit was used to perforate the  subchondral bone on both sides of the joint.  The subchondral bone was then further perforated with a small curved osteotome.  The subtalar joint was then reduced.  A guidepin was inserted from the tuberosity of the heel across the posterior facet into the talar dome laterally.  AP  and lateral radiographs of the ankle as well as lateral and Harris heel radiographs of the foot showed appropriate alignment of the subtalar joint.  The joint was then compressed with a 6.5 mm partially-threaded cannulated screw from the [employer].  A second guidepin was then inserted from the dorsal neck into the anterior process of the talus.  Radiographs confirmed appropriate position of the guidepin.  It was overdrilled.  A fully threaded 6.5 mm position screw was then inserted from dorsal to plantar.  Attention was then returned to the talonavicular joint.  The joint was reduced.  A guidepin for a 5 mm cannulated screw was inserted from the navicular across into the head of the talus.  Radiographs confirmed appropriate position of the guidepin and appropriate coverage of the head of the talus by the navicular.  The guidepin was overdrilled.  A 5 mm partially-threaded screw was inserted.  It was noted to have excellent purchase and compressed the joint appropriately.  The joint was then further fixed with a 20 mm [employer] Arcus staple after predrilling.  AP, mortise and lateral radiographs of the ankle are obtained as well as AP, lateral and Harris heel radiographs of the foot.  These show interval arthrodesis of the talonavicular and subtalar joints with appropriate position and length of all hardware.  The incisions were irrigated copiously and sprinkled with vancomycin  powder.  Subcutaneous tissues were approximated with Monocryl.  Skin incisions were closed with nylon.  Sterile dressings were applied followed by a well-padded short leg splint.  The tourniquet was released after application of the dressings.  The patient was awakened from anesthesia and transported to the recovery room in stable condition.   FOLLOW UP PLAN: Nonweightbearing on the left lower extremity.  Xarelto  for DVT prophylaxis.  Follow-up in the office in 2 weeks for suture removal and conversion to a short leg cast.   RADIOGRAPHS: AP, mortise and lateral radiographs of the left ankle are obtained intraoperatively.  These show interval arthrodesis of the subtalar and talonavicular joints.  Hardware is appropriately positioned and of the appropriate  lengths.  No other acute injuries are noted.  AP, lateral and Harris heel radiographs of the left foot are obtained intraoperatively.  These show interval arthrodesis of the subtalar and talonavicular joints.  Hardware is appropriately positioned and of the appropriate lengths.  No other acute injuries are noted.   Dickey Sales, PA-C was present and scrubbed for the duration of the operative case. Her assistance was essential in positioning the patient, prepping and draping, gaining and maintaining exposure, performing the operation, closing and dressing the wounds and applying the splint.

## 2024-07-07 NOTE — Progress Notes (Signed)
 Assisted Dr. Maryclare with left, adductor canal, popliteal block. Side rails up, monitors on throughout procedure. See vital signs in flow sheet. Tolerated Procedure well.

## 2024-07-07 NOTE — Anesthesia Preprocedure Evaluation (Signed)
"                                    Anesthesia Evaluation  Patient identified by MRN, date of birth, ID band Patient awake    Reviewed: Allergy & Precautions, H&P , NPO status , Patient's Chart, lab work & pertinent test results  Airway Mallampati: II   Neck ROM: full    Dental   Pulmonary sleep apnea , former smoker   breath sounds clear to auscultation       Cardiovascular hypertension, + DVT   Rhythm:regular Rate:Normal     Neuro/Psych    GI/Hepatic ,GERD  ,,  Endo/Other  diabetes, Type 2  Class 3 obesity  Renal/GU Renal InsufficiencyRenal disease     Musculoskeletal  (+) Arthritis ,    Abdominal   Peds  Hematology   Anesthesia Other Findings   Reproductive/Obstetrics                              Anesthesia Physical Anesthesia Plan  ASA: 3  Anesthesia Plan: General   Post-op Pain Management: Regional block*   Induction: Intravenous  PONV Risk Score and Plan: 2 and Ondansetron , Dexamethasone  and Treatment may vary due to age or medical condition  Airway Management Planned: LMA  Additional Equipment:   Intra-op Plan:   Post-operative Plan: Extubation in OR  Informed Consent: I have reviewed the patients History and Physical, chart, labs and discussed the procedure including the risks, benefits and alternatives for the proposed anesthesia with the patient or authorized representative who has indicated his/her understanding and acceptance.     Dental advisory given  Plan Discussed with: CRNA, Anesthesiologist and Surgeon  Anesthesia Plan Comments:         Anesthesia Quick Evaluation  "

## 2024-07-07 NOTE — Transfer of Care (Signed)
 Immediate Anesthesia Transfer of Care Note  Patient: Joseph Pham  Procedure(s) Performed: Left talonavicular and subtalar joint arthrodeses (Left: Foot) Left gastrocnemius recession versus Achilles tendon lengthening (Left: Foot)  Patient Location: PACU  Anesthesia Type:General  Level of Consciousness: awake, alert , and oriented  Airway & Oxygen Therapy: Patient Spontanous Breathing and Patient connected to face mask oxygen  Post-op Assessment: Report given to RN and Post -op Vital signs reviewed and stable  Post vital signs: Reviewed and stable  Last Vitals:  Vitals Value Taken Time  BP 127/73 07/07/24 14:36  Temp    Pulse 62 07/07/24 14:39  Resp 17 07/07/24 14:39  SpO2 97 % 07/07/24 14:39  Vitals shown include unfiled device data.  Last Pain:  Vitals:   07/07/24 1000  TempSrc: Tympanic  PainSc: 7       Patients Stated Pain Goal: 9 (07/07/24 1000)  Complications: No notable events documented.

## 2024-07-07 NOTE — H&P (Signed)
 Joseph Pham is an 74 y.o. male.   Chief Complaint: Left foot pain HPI: 74 year old male with past medical history significant for diabetes and pulmonary embolism has a history of worsening left foot pain due to stage III posterior tibial tendon dysfunction and a tight heel cord.  He has failed nonoperative treatment including activity modification, bracing and pain medicine.  He presents today for left talonavicular and subtalar joint arthrodesis.  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   Complication of anesthesia    low heart rate during lithotripsy procedure   Deviated septum 07/19/2019   Diabetes (HCC) 03/16/2017   Essential hypertension 10/14/2015   Family history of adverse reaction to anesthesia    sister coded after surgery   GERD (gastroesophageal reflux disease)    Heart murmur    Hematuria    Hemorrhoid    History of kidney stones    Hyperlipidemia    Impingement of left ankle joint 03/06/2021   Kidney stone 03/16/2017   Lumbar radiculopathy 03/16/2017   Nasal turbinate hypertrophy 07/19/2019   OSA on CPAP    Paronychia 08/16/2015   PE (pulmonary thromboembolism) (HCC) 02/2019   Posterior tibialis tendinitis of both lower extremities 02/02/2020   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   CARPAL TUNNEL RELEASE Left 06/2016   CARPAL TUNNEL RELEASE Right 03/2017   CATARACT EXTRACTION, BILATERAL Bilateral 2005   COLONOSCOPY     last with medoff unsure when    CYSTOSCOPY WITH RETROGRADE PYELOGRAM, URETEROSCOPY AND STENT PLACEMENT Left 04/09/2022   Procedure: CYSTOSCOPY WITH RETROGRADE PYELOGRAM AND STENT PLACEMENT;  Surgeon: Carolee Sherwood JONETTA DOUGLAS, MD;  Location: [facility];  Service: Urology;  Laterality: Left;   CYSTOSCOPY WITH RETROGRADE PYELOGRAM, URETEROSCOPY AND STENT PLACEMENT Left 05/04/2022   Procedure: CYSTOSCOPY WITH RETROGRADE PYELOGRAM, URETEROSCOPY AND STENT PLACEMENT;  Surgeon: Devere Lonni Righter, MD;  Location: [facility];  Service: Urology;  Laterality: Left;   CYSTOSCOPY WITH URETEROSCOPY AND STENT PLACEMENT Right 10/28/2013   Procedure: RIGHT URETEROSCOPY AND STENT PLACEMENT;  Surgeon: Mark C Ottelin, MD;  [facility];  Service: Urology;  Laterality: Right;   CYSTOSCOPY/URETEROSCOPY/HOLMIUM LASER/STENT PLACEMENT Left 09/06/2020   Procedure: CYSTOSCOPY/POSSIBLE URETEROSCOPY/HOLMIUM LASER/STENT PLACEMENT;  Surgeon: Rosalind Zachary NOVAK, MD;  Location: [facility];  Service: Urology;  Laterality: Left;  30 MINS   CYSTOSCOPY/URETEROSCOPY/HOLMIUM LASER/STENT PLACEMENT Left 04/22/2022   Procedure: CYSTOSCOPY/URETEROSCOPY/HOLMIUM LASER/STENT REPLACEMENT;  Surgeon: Rosalind Zachary NOVAK, MD;  Location: [facility];  Service: Urology;  Laterality: Left;  1 HR  CYSTOSCOPY/URETEROSCOPY/HOLMIUM LASER/STENT PLACEMENT Left 05/20/2022   Procedure: CYSTOSCOPY/URETEROSCOPY/HOLMIUM LASER/STENT EXCHANGE;  Surgeon: Rosalind Zachary NOVAK, MD;  Location: [facility];  Service: Urology;  Laterality: Left;   CYSTOSCOPY/URETEROSCOPY/HOLMIUM LASER/STENT PLACEMENT Left 07/24/2023    Procedure: DIAGNOSTIC URETEROSCOPY;  Surgeon: Selma Donnice SAUNDERS, MD;  Location: [facility];  Service: Urology;  Laterality: Left;  60 MINUTES NEEDED FOR CASE   EXTRACORPOREAL SHOCK WAVE LITHOTRIPSY  02/08/2009   HOLMIUM LASER APPLICATION Right 10/28/2013   Procedure: HOLMIUM LASER LITHO;  Surgeon: Oneil JAYSON Rafter, MD;  Location: [facility];  Service: Urology;  Laterality: Right;   IR CONVERT LEFT NEPHROSTOMY TO NEPHROURETERAL CATH  09/08/2023   IR NEPHROSTOGRAM LEFT THRU EXISTING ACCESS  09/01/2023   IR NEPHROSTOMY EXCHANGE LEFT  09/08/2023   IR NEPHROSTOMY EXCHANGE LEFT  11/03/2023   IR NEPHROSTOMY PLACEMENT LEFT  08/26/2023   IR NEPHROSTOMY PLACEMENT LEFT  12/14/2023   LEFT KNEE SURG.  1993  &  NOV 2011   LEFT URETEROSCOPIC STONE EXTRACTION  02/23/2009   POLYPECTOMY  2014   1 polyp per dr ira notes- no surg path to determine polyps type but was a 5 yr recall    RADIOACTIVE SEED IMPLANT  08/08/2011   Procedure: RADIOACTIVE SEED IMPLANT;  Surgeon: Oneil JAYSON Rafter, MD;  Location: [facility];  Service: Urology;  Laterality: N/A;  seeds implanted 50 seeds found in bladder=none    REPLACEMENT TOTAL KNEE Left 2018   SKIN CANCER EXCISION     Burned off left side of nose, and head   TOTAL KNEE ARTHROPLASTY Right 08/2018   UMBILICAL HERNIA REPAIR N/A 12/16/2023   Procedure: REPAIR, HERNIA, UMBILICAL, ADULT;  Surgeon: Alvaro Ricardo NOVAK Mickey., MD;  Location: [facility];  Service: Urology;  Laterality: N/A;   UPPER GASTROINTESTINAL ENDOSCOPY     URETEROSCOPY  03/15/2012   Procedure: URETEROSCOPY;  Surgeon: Oneil JAYSON Rafter, MD;  Location: [facility];  Service: Urology;  Laterality: Right;  Right Ueteroscopy     Family History  Problem Relation Age of Onset   Cancer Mother    Diabetes Father    CAD Father    Heart attack Father    Diabetes Sister    Colon polyps Sister    Colon polyps Brother    Colon cancer Neg Hx    Esophageal cancer Neg Hx    Rectal cancer Neg  Hx    Stomach cancer Neg Hx    Social History:  reports that he quit smoking about 31 years ago. His smoking use included cigarettes. He started smoking about 41 years ago. He has quit using smokeless tobacco.  His smokeless tobacco use included chew. He reports that he does not drink alcohol and does not use drugs.  Allergies: Allergies[1]  No medications prior to admission.    No results found for this or any previous visit (from the past 48 hours). No results found.  Review of Systems no recent fever, chills, nausea, vomiting or changes in his appetite  Height 5' 6 (1.676 m), weight 116.9 kg. Physical Exam  Well-nourished well-developed man in no apparent distress.  Alert and oriented.  Normal mood and affect.  Gait is antalgic to the left.  The left foot has pes planus.  He has hindfoot valgus and a tight heel cord.  Skin is healthy.  Pulses are palpable.  No lymphadenopathy.  Diminished sensibility to light touch dorsally and plantarly at the forefoot.  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  The risks and benefits of the alternative treatment options have been discussed in detail.  The patient wishes to proceed with surgery and specifically understands risks of bleeding, infection, nerve damage, blood clots, need for additional surgery, amputation and death.   Norleen Armor, MD 07/24/2024, 8:43 AM       [1]  Allergies Allergen Reactions   Aspirin Anaphylaxis   Doxycycline Nausea And Vomiting   Amoxicillin Itching   Chlorhexidine  Gluconate Rash

## 2024-07-08 ENCOUNTER — Encounter (HOSPITAL_BASED_OUTPATIENT_CLINIC_OR_DEPARTMENT_OTHER): Payer: Self-pay | Admitting: Orthopedic Surgery
